# Patient Record
Sex: Male | Born: 1940 | Race: White | Hispanic: No | Marital: Married | Smoking: Never smoker
Health system: Southern US, Community
[De-identification: ages and names within clinical notes are randomized; demographics above are authoritative.]

## PROBLEM LIST (undated history)

## (undated) DIAGNOSIS — R51 Headache: Secondary | ICD-10-CM

## (undated) DIAGNOSIS — Z8719 Personal history of other diseases of the digestive system: Secondary | ICD-10-CM

## (undated) DIAGNOSIS — I493 Ventricular premature depolarization: Secondary | ICD-10-CM

## (undated) DIAGNOSIS — M1990 Unspecified osteoarthritis, unspecified site: Secondary | ICD-10-CM

## (undated) DIAGNOSIS — I491 Atrial premature depolarization: Secondary | ICD-10-CM

## (undated) DIAGNOSIS — I10 Essential (primary) hypertension: Secondary | ICD-10-CM

## (undated) DIAGNOSIS — I4729 Other ventricular tachycardia: Secondary | ICD-10-CM

## (undated) DIAGNOSIS — M797 Fibromyalgia: Secondary | ICD-10-CM

## (undated) DIAGNOSIS — C801 Malignant (primary) neoplasm, unspecified: Secondary | ICD-10-CM

## (undated) DIAGNOSIS — I472 Ventricular tachycardia: Secondary | ICD-10-CM

## (undated) DIAGNOSIS — R9431 Abnormal electrocardiogram [ECG] [EKG]: Secondary | ICD-10-CM

## (undated) DIAGNOSIS — J189 Pneumonia, unspecified organism: Secondary | ICD-10-CM

## (undated) DIAGNOSIS — D496 Neoplasm of unspecified behavior of brain: Secondary | ICD-10-CM

## (undated) DIAGNOSIS — Z972 Presence of dental prosthetic device (complete) (partial): Secondary | ICD-10-CM

## (undated) DIAGNOSIS — I428 Other cardiomyopathies: Secondary | ICD-10-CM

## (undated) DIAGNOSIS — K6389 Other specified diseases of intestine: Secondary | ICD-10-CM

## (undated) DIAGNOSIS — R52 Pain, unspecified: Secondary | ICD-10-CM

## (undated) DIAGNOSIS — K429 Umbilical hernia without obstruction or gangrene: Secondary | ICD-10-CM

## (undated) DIAGNOSIS — Z87442 Personal history of urinary calculi: Secondary | ICD-10-CM

## (undated) DIAGNOSIS — R5383 Other fatigue: Secondary | ICD-10-CM

## (undated) DIAGNOSIS — F419 Anxiety disorder, unspecified: Secondary | ICD-10-CM

## (undated) HISTORY — DX: Other specified diseases of intestine: K63.89

## (undated) HISTORY — DX: Other ventricular tachycardia: I47.29

## (undated) HISTORY — DX: Umbilical hernia without obstruction or gangrene: K42.9

## (undated) HISTORY — DX: Essential (primary) hypertension: I10

## (undated) HISTORY — DX: Abnormal electrocardiogram (ECG) (EKG): R94.31

## (undated) HISTORY — PX: TONSILLECTOMY: SUR1361

## (undated) HISTORY — PX: LITHOTRIPSY: SUR834

## (undated) HISTORY — DX: Atrial premature depolarization: I49.1

## (undated) HISTORY — PX: BRAIN TUMOR EXCISION: SHX577

## (undated) HISTORY — DX: Ventricular premature depolarization: I49.3

## (undated) HISTORY — DX: Ventricular tachycardia: I47.2

## (undated) HISTORY — DX: Other cardiomyopathies: I42.8

## (undated) HISTORY — DX: Personal history of other diseases of the digestive system: Z87.19

---

## 1988-10-08 DIAGNOSIS — M797 Fibromyalgia: Secondary | ICD-10-CM

## 1988-10-08 HISTORY — DX: Fibromyalgia: M79.7

## 1999-02-08 DIAGNOSIS — K6389 Other specified diseases of intestine: Secondary | ICD-10-CM

## 1999-02-08 HISTORY — PX: COLON SURGERY: SHX602

## 1999-02-08 HISTORY — DX: Other specified diseases of intestine: K63.89

## 1999-12-01 ENCOUNTER — Encounter (INDEPENDENT_AMBULATORY_CARE_PROVIDER_SITE_OTHER): Payer: Self-pay | Admitting: *Deleted

## 1999-12-01 ENCOUNTER — Encounter: Payer: Self-pay | Admitting: Hematology & Oncology

## 1999-12-01 ENCOUNTER — Inpatient Hospital Stay (HOSPITAL_COMMUNITY): Admission: RE | Admit: 1999-12-01 | Discharge: 1999-12-09 | Payer: Self-pay | Admitting: Gastroenterology

## 1999-12-06 ENCOUNTER — Encounter: Payer: Self-pay | Admitting: General Surgery

## 1999-12-28 ENCOUNTER — Ambulatory Visit (HOSPITAL_COMMUNITY): Admission: RE | Admit: 1999-12-28 | Discharge: 1999-12-28 | Payer: Self-pay | Admitting: General Surgery

## 1999-12-28 ENCOUNTER — Encounter: Payer: Self-pay | Admitting: General Surgery

## 2000-01-14 ENCOUNTER — Encounter (INDEPENDENT_AMBULATORY_CARE_PROVIDER_SITE_OTHER): Payer: Self-pay | Admitting: Specialist

## 2000-01-14 ENCOUNTER — Inpatient Hospital Stay (HOSPITAL_COMMUNITY): Admission: RE | Admit: 2000-01-14 | Discharge: 2000-01-19 | Payer: Self-pay | Admitting: General Surgery

## 2000-02-04 ENCOUNTER — Ambulatory Visit (HOSPITAL_COMMUNITY): Admission: RE | Admit: 2000-02-04 | Discharge: 2000-02-04 | Payer: Self-pay | Admitting: General Surgery

## 2000-02-04 ENCOUNTER — Encounter: Payer: Self-pay | Admitting: General Surgery

## 2007-05-28 ENCOUNTER — Ambulatory Visit (HOSPITAL_COMMUNITY): Admission: RE | Admit: 2007-05-28 | Discharge: 2007-05-28 | Payer: Self-pay | Admitting: Urology

## 2007-07-29 ENCOUNTER — Emergency Department (HOSPITAL_COMMUNITY): Admission: EM | Admit: 2007-07-29 | Discharge: 2007-07-29 | Payer: Self-pay | Admitting: Emergency Medicine

## 2007-08-16 ENCOUNTER — Emergency Department (HOSPITAL_COMMUNITY): Admission: EM | Admit: 2007-08-16 | Discharge: 2007-08-16 | Payer: Self-pay | Admitting: Internal Medicine

## 2007-08-17 ENCOUNTER — Ambulatory Visit: Payer: Self-pay | Admitting: Cardiovascular Disease

## 2008-01-15 ENCOUNTER — Ambulatory Visit: Payer: Self-pay | Admitting: Cardiovascular Disease

## 2010-06-22 NOTE — Assessment & Plan Note (Signed)
[facility]                            CARDIOLOGY OFFICE NOTE   NAME:[name], [name]                    MRN:          [id number]  DATE:01/15/2008                            DOB:          02-08-1940    [name] returns today for followup, but [name] has had borderline  hypertension.  [name] was on Micardis hydrochlorothiazide, but felt  terrible.  [name] was lethargic, lightheaded, weak, apparently dehydrated.   [name] stopped his medicine and feels better.  [name] has not been taking his  blood pressure at home.  [name] does not really seem to be interested in  taking blood pressure pills.  [name] had a ruptured biceps tendon on the  right side.  [name] followed up with the Orthopedic docs who did not feel it  needed to be reattached.  [name] is actually playing a little bit of tennis  now.  [name] is under a little bit of stress.  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  Other than this, [name] is not having any  recurrent chest pain.   REVIEW OF SYSTEMS:  Otherwise negative.   PHYSICAL EXAMINATION:  GENERAL:  Remarkable for middle-aged white male  in no distress.  VITAL SIGNS:  Blood pressure is 140/80, pulse is 70 and regular,  respiratory rate 14, afebrile.  HEENT:  Unremarkable.  NECK:  Supple.  No lymphadenopathy, thyromegaly, or JVP elevation.  LUNGS:  Clear with good diaphragmatic motion.  No wheezing.  CARDIAC:  S1 and S2 normal heart sounds.  PMI normal.  ABDOMEN:  Benign.  Bowel sounds positive.  No AAA, no tenderness, no  bruit, no hepatosplenomegaly, no hepatojugular reflux, or tenderness.  EXTREMITIES:  Distal pulses are intact.  No edema.  NEURO:  Nonfocal.  SKIN:  Warm and dry.  [name] has a ruptured right biceps tendon.   IMPRESSION:  1. Chest pain atypical.  No evidence of coronary artery disease.      Continue baby aspirin.  2. Borderline hypertension.  Continue to monitor  low-sodium diet.      Avoid stimulants.  Check blood pressure readings at home.  3. Ruptured biceps tendon.  Followup Orthopedics.  Continue p.r.n.      anti-inflammatories.  I will see him back in 6 months.     Noralyn Pick. Eden Emms, MD, [facility]  Electronically Signed    PCN/MedQ  DD: 01/15/2008  DT: 01/16/2008  Job #: [id number]

## 2010-06-22 NOTE — Assessment & Plan Note (Signed)
[facility]                            CARDIOLOGY OFFICE NOTE   NAME:[name], [name]                    MRN:          [id number]  DATE:08/17/2007                            DOB:          [birth year]    HISTORY OF PRESENT ILLNESS:  Ms. Benninger is seen today as a new patient  at the request of Dr. Carmon Ginsberg in the [facility] ER.  He has been in the  ER twice in the last month.  He has had atypical chest pain.  The pain  is in the center of his chest.  It is sharp.  It is nonpleuritic.  It  has been intermittent.  It is in the setting of significant fatigue.   The patient is concerned about his blood pressure medicine.  He  apparently has been on it for 2-3 years.  However, he never feels well  on it.  He says he takes his blood pressure at home and it tends to be  low.  He was seen in the ER recently and was told he was dehydrated.  He  despite this did not stop his Micardis/HCTZ.   Along with this atypical pain, he gets occasional headaches.  He feels  faint and dizzy.  I think he is clearly being over medicated and should  not be on a diuretic.   In fact, at times, he has stopped his Micardis/HCTZ and says he feels  better.  The patient plays a lot a [employer] tennis at fairly high level 4.0.  Particularly with his activity level, I think this is exacerbating his  dehydration and postural-like symptoms.   He has never had any significant chest pain prior to these episodes of  dehydration and probable postural symptoms.  He does not smoke.  He is a  nondiabetic.  He has had borderline hypertension in the past.   FAMILY HISTORY:  Negative for premature coronary disease.   PAST MEDICAL HISTORY:  His past medical histsory is otherwise remarkable  for a cranial canal nonmalignant tumor in 1968, history of colon mass in  2000, and history of multiple kidney stones and ureteral problems.   ALLERGIES:  The patient is allergic to [facility] and SULFAS.   MEDICATIONS:  He is only on Micardis/HCTZ 40/12.5 which will be stopped.  He takes p.r.n. Tylenol and Advil for aches and pains.  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  He does not smoke or drink.   SOCIAL HISTORY:  He is semi-retired.   FAMILY HISTORY:  Remarkable for mother still being alive.  Father died  at age 69 of unknown causes.   PHYSICAL EXAMINATION:  GENERAL:  His exam is remarkable for a thin  elderly white male in no distress.  VITAL SIGNS:  Blood pressure is 125/70, pulse is 62 and regular.  He  does have mild postural symptoms with his blood pressure going to 110  systolic on standing, afebrile, respiratory rate 14.  HEENT:  Unremarkable.  Carotids normal without bruit. No  lymphadenopathy, thyromegaly, or JVP elevation.  LUNGS:  Clear.  Good diaphragmatic motion. No wheezing.  HEART:  S1 with split second heart sound.  PMI normal.  ABDOMEN:  Benign.  He has an umbilical hernia.  No AAA, no tenderness,  no bruit, no hepatosplenomegaly, no hepatojugular reflexes.  Pulses are  intact, no edema.  NEUROLOGICAL:  Nonfocal.  SKIN:  Warm and dry.  No muscular weakness.   I reviewed his ER notes, reviewed his lab work as well as his EKG.  His  EKG shows sinus rhythm is entirely normal.   LABORATORY DATA:  The patient's hematocrit was 43.2.  His potassium was  3.6, BUN was 19, creatinine was 1.26.   LFTs were normal.   IMPRESSION:  1. Chest pain, atypical.  Follow up stress echo.  2. Hypertension, borderline; clearly feels worse on medication.  Stop      Micardis/HCTZ.  Follow blood pressure readings at home.  Follow up      in 6-8 weeks.  3. Relative dizziness, postural symptoms, dehydration.  Stop diuretic      medicine, particularly HCTZ.  If his blood pressure arises, he will      need to be on a nondiuretic fill.   I will see him back in 6-8 weeks to assess his blood pressure off   medicine and hopefully a stress echo will be normal.     Theron Arista C. Eden Emms, MD, [facility]  Electronically Signed    PCN/MedQ  DD: 08/17/2007  DT: 08/18/2007  Job #: [id number]

## 2010-06-25 NOTE — Discharge Summary (Signed)
[place]. [facility]  Patient:    [name], [name]                      MRN: [id number] Adm. Date:  [id number] Disc. Date: [id number] Attending:  Caleen Essex                           Discharge Summary  HISTORY OF PRESENT ILLNESS:  The patient is a 70 year old gentleman who presented with rectal bleeding.  He underwent colonoscopy at which time a narrowed area of the sigmoid colon was found.  Biopsies were taken, and surgery consult was called.  I saw the patient and admitted him to the hospital for a workup.  His CT scan was much more consistent with a diverticulitis than with a colon tumor.  He was started on broad-spectrum antibiotics intravenously and over the next several days improved.  By November 1, he was tolerating a low-residue diet, and his pain was continuing to improve on oral antibiotics, and he was ready for discharge to home.  DISCHARGE MEDICATIONS:  Continue his home medications as well as to take Tequin and Flagyl.  DIET:  Low residue.  ACTIVITY:  As tolerated  CONDITION UPON DISCHARGE:  Stable.  DISPOSITION:  The patient was discharged home. DD:  12/24/99 TD:  12/25/99 Job: [id number]

## 2010-06-25 NOTE — H&P (Signed)
[place]. [facility]  Patient:    [name], [name]                      MRN: [id number] Adm. Date:  [id number] Attending:  Charna Elizabeth                         History and Physical  HISTORY OF PRESENT ILLNESS:  [name] is a 69 year old white male who has been having left lower quadrant abdominal pain since July.  For the last week he has also been noticing some blood with his stools.  He denies any nausea, vomiting, fevers, chills, chest pain, shortness of breath, diarrhea, or dysuria.  His bowels have actually been moving about twice a day regularly, and have been formed.  He did lose about 10-15 pounds a couple of months ago, but gained that weight back fairly easily.  Otherwise his appetite has been good.  REVIEW OF SYSTEMS:  Is otherwise pretty unremarkable.  He was brought to the hospital today for a colonoscopy and upper endoscopy. 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  The colonoscopy was significant for a near obstructing mass lesion at about 20.0 cm in his sigmoid colon.  This area was biopsied but an endoscope was unable to be passed proximal to this area.  He did perform a bowel prep at home prior to his colonoscopy.  PAST MEDICAL HISTORY:  Significant for gastroesophageal reflux.  PAST SURGICAL HISTORY:  Significant for a tonsillectomy.  CURRENT MEDICATIONS:  None.  ALLERGIES:  SULFA DRUGS.  FAMILY HISTORY:  Significant for colon cancer in his father.  SOCIAL HISTORY:  Significant for weekend alcohol use and the patient denies any use of tobacco products.  PHYSICAL EXAMINATION:  GENERAL:  He is a well-developed, well-nourished white male, in no acute distress.  SKIN:  Warm and dry.  HEENT:  Extraocular muscles intact.  Pupils equal, round, reactive to light.  NECK:  No bruits.  LUNGS:  Clear to auscultation bilaterally.  HEART:  A regular rate and  rhythm, without murmur.  ABDOMEN:  Soft, nontender, except for some tenderness to palpation of his left lower quadrant.  He has no surgical scars on his abdomen.  No inguinal lymphadenopathy.  EXTREMITIES:  No cyanosis, clubbing, or edema with good peripheral pulses.  NEUROLOGIC:  He is alert and oriented x 4.  NODES:  I can palpate no lymphadenopathy.  LABORATORY DATA:  White count 12,900, hemoglobin 14, hematocrit 40.3, platelet count 469,000.  PT 12.7, PTT 29, INR 1.0.  Sodium 140, potassium 4.1, chloride 100, CO2 of 30, BUN 14, creatinine 0.9, glucose 93.  Alkaline phosphatase 133, SGOT 28, SGPT 25, albumin 3.7, calcium 9.0.  ASSESSMENT/PLAN:  This is a 70 year old white male with near obstructing distal colon mass lesion, worrisome for malignancy.  PLAN:  We will admit him to the hospital today and finish his workup for colon cancer, as well as complete his bowel prep, since he is not completely obstructed yet.  We will then talk to the patient and to his family about our findings, and our medical and surgical options. DD:  12/01/99 TD:  12/01/99 Job: [id number]

## 2010-06-25 NOTE — Op Note (Signed)
[place]. [facility]  Patient:    [name], [name]                      MRN: [id number] Proc. Date: 01/14/00 Adm. Date:  [id number] Attending:  Caleen Essex                           Operative Report  HISTORY OF PRESENT ILLNESS:  The patient is a 70 year old gentleman with diverticulitis.  HOSPITAL COURSE:  He was brought to the operating room on January 14, 2000 for a sigmoid colectomy.  He tolerated the procedure well.  He has had a fairly uneventful postoperative course and as of today, he is tolerating a regular diet.  His pain is well controlled on p.o. pain meds.  He was able to urinate without difficulty and he is ambulating without difficulty.  His medications at time of discharge are his regular home medicines and Vicodin.  His activity level, he is to do no heavy lifting.  His condition is stable.  FOLLOW-UP:  He should schedule an appointment with me in approximately two weeks.  FINAL DIAGNOSES:  Sigmoid diverticulitis.  He is discharged home. DD:  01/19/00 TD:  01/19/00 Job: [id number]

## 2010-06-25 NOTE — Procedures (Signed)
[place]. [facility]  Patient:    [name], [name]                      MRN: [id number] Proc. Date: 12/01/99 Adm. Date:  [id number] Attending:  Caleen Essex CC:         Paulino Rily, M.D.  Chevis Pretty, M.D.   Procedure Report  DATE OF BIRTH:  [birth year].  PROCEDURE:  Esophagogastroduodenoscopy with biopsies.  ENDOSCOPIST:  Anselmo Rod, M.D.  INSTRUMENT USED:  Olympus video panendoscope.  INDICATIONS FOR PROCEDURE:  A 70 year old white male with a history of epigastric, left upper quadrant, and left lower quadrant pain.  Rule out peptic ulcer disease, esophagitis, gastritis, etc.  PREPROCEDURE PREPARATION:  Informed consent was procured from the patient. The patient was fasted for eight hours prior to the procedure.  PREPROCEDURE PHYSICAL:  VITAL SIGNS:  Patient with stable vital signs.  NECK:  Supple.  CHEST:  Clear to auscultation.  S1, S2 regular.  ABDOMEN:  Soft with normal abdominal bowel sounds.  DESCRIPTION OF PROCEDURE:  The patient was placed in the left lateral decubitus position and sedated with 50 mg of Demerol and 5 mg of Versed intravenously.  Once the patient was adequately sedate and maintained on low-flow oxygen and continuous cardiac monitoring, the Olympus video panendoscope was advanced through the mouthpiece over the tongue to the esophagus under direct vision.  The proximal esophagus appeared normal.  There was a patchy area of salmon-pink mucosa that was biopsied to rule out Barretts.  There was an edematous, _____, superficial ulceration in the proximal portion of the stomach.  This was biopsied for pathology.  These were split, two biopsies were done, a no more biopsies were taken.  The patient has a small hiatal hernia.  The proximal small bowel appeared normal, and so did the rest of the antral mucosa.  IMPRESSION: 1. Patchy area of salmon-pink mucosa around the Z-line, biopsied for    Barretts, results  pending. 2. Edematous gastric fold with easy bleeding in proximal half of stomach. 3. A small hiatal hernia. 4. Normal proximal small bowel.  RECOMMENDATIONS: 1. Proceed with colonoscopy at time. 2. N.P.O. status. 3. Await pathology results. DD:  12/01/99 TD:  12/02/99 Job: [id number]

## 2010-06-25 NOTE — Procedures (Signed)
[place]. [facility]  Patient:    [name], [name]                      MRN: [id number] Proc. Date: 12/01/99 Adm. Date:  [id number] Attending:  Caleen Essex CC:         Paulino Rily, M.D.  Chevis Pretty, M.D.   Procedure Report  DATE OF BIRTH:  [birth year]  REFERRING PHYSICIAN:  PROCEDURE PERFORMED:  Colonoscopy changed to flexible sigmoidoscopy with biopsies.  ENDOSCOPIST:  Anselmo Rod, M.D.  INSTRUMENT USED:  Olympus video colonoscope.  INDICATIONS FOR PROCEDURE:  The patient is a  70 year old white male with history of rectal bleeding and history of colon cancer in his father rule out colonic masses, polyps, erosions, ulcerations, etc.  PREPROCEDURE PREPARATION:  Informed consent was procured from the patient. The patient was fasted for eight hours prior to the procedure and prepped with a bottle of magnesium citrate and a gallon of NuLytely the night prior to the procedure.  PREPROCEDURE PHYSICAL:  The patient had stable vital signs.  Neck supple. Chest clear to auscultation.  S1, S2 regular.  Abdomen soft with normal abdominal bowel sounds.  Epigastric, left upper quadrant and left lower quadrant tenderness on palpation and guarding, no rebound, no rigidity, no hepatosplenomegaly.  DESCRIPTION OF PROCEDURE:  The patient was placed in the left lateral decubitus position and sedated with an additional 25 mg of Demerol and 2 mg of Versed intravenously.  Once the patient was adequately sedated and maintained on low-flow oxygen and continuous cardiac monitoring, the Olympus video colonoscope was advanced from the rectum to about 20 cm with difficulty. There seemed to be a constricting lesion at this point and the scope could not be advanced beyond this point.  The scope was changed from an adult to a pediatric colonoscope and even then this area could not be bypassed.  Biopsies were done from this area to rule out adenocarcinoma.  The  patient tolerated the procedure well without complications.  IMPRESSION: 1. Near obstructing lesion at 20 cm.  Biopsies done, results pending. 2. Procedure aborted at 20 cm because the scope could not be advanced beyond    this point.  RECOMMENDATIONS: 1. CT scan of the abdomen to be done tonight. 2. Surgical evaluation being procured by Dr. Ailene Ards. Carolynne Edouard. 3. Further recommendations to be made thereafter. DD:  12/01/99 TD:  12/02/99 Job: [id number]

## 2010-06-25 NOTE — Op Note (Signed)
[place]. [facility]  Patient:    [name], [name]                      MRN: [id number] Proc. Date: 01/14/00 Adm. Date:  [id number] Attending:  Chevis Pretty S                           Operative Report  PREOPERATIVE DIAGNOSIS:  Sigmoid diverticulitis.  POSTOPERATIVE DIAGNOSIS:  Sigmoid diverticulitis.  PROCEDURE:  Sigmoid colectomy.  SURGEON:  Chevis Pretty, M.D.  ASSISTANT:  Currie Paris, M.D.  ANESTHESIA:  General endotracheal.  DESCRIPTION OF PROCEDURE:  After informed consent was obtained, the patient was brought to the operating room and placed in the supine position on the operating room table.  After adequate induction of general anesthesia, the patients abdomen was prepped with Betadine and draped in the usual sterile manner.  A vertical-oriented lower midline incision was made from just above the umbilicus to the pubic symphysis.  This incision was carried in through the skin and subcutaneous tissues with the Bovie electrocautery until the midline fascia of the anterior abdominal wall was encountered.  This fascial layer was incised with the Bovie electrocautery, and the preperitoneal space was probed with a hemostat until the peritoneum was opened bluntly.  At this point, a finger was inserted through the peritoneal opening and the anterior abdominal wall was palpated.  There were no adhesions noted.  The rest of the incision was then opened under direct vision using the Bovie electrocautery. The abdomen was then examined, and a short segment of sigmoid colon was identified and found to be very inflamed.  This portion of the colon was densely adherent to the bladder and pelvic sidewalls.  There were segments of descending and sigmoid colon above the inflamed area that appeared normal and by palpation appeared to be upper portions of the upper rectum that also felt normal.  The dissection was started by dividing the retroperitoneal  attachment of the sigmoid colon along the white line of Toldt.  This was carried down to the inflamed area of sigmoid colon.  Using a combination of sharp dissection with the Bovie electrocautery between the arms of a tonsil clamp as well as blunt dissection with the tonsil clamp as well as with a blunt probing with the fingers and finger fracture of some of the inflammatory adhesions, the sigmoid colon was able to be freed from its inflammatory attachments. Anteriorly the sigmoid colon was densely adherent to the bladder, and a small section of bladder muscle had to be taken with the sigmoid colon.  This was a very superficial layer of muscle, and the bladder appeared to be intact when this division was complete.  Next, the attention was directed to the retroperitoneum, and using blunt dissection, the ureter was identified and the ureter was deep and lateral to all of the dissection that had been done to this point, and care was taken to keep the ureter out of the way.  Next, a point of normal sigmoid colon superiorly and a portion of normal rectum inferiorly was chosen as the division points.  Superiorly and on the sigmoid colon, the mesenteric attachment to the wall of the sigmoid colon was identified, and a free portion of this was able to be palpated and opened with the Bovie electrocautery, allowing circumferential clearing of the bowel wall. Next, an Allen clamp was placed through this hole, and the wall of  the sigmoid colon was clamped.  A Kelly clamp was placed inferiorly through this same hole, and the sigmoid colon was divided between the two with a 10 blade knife. The mesentery of this portion of diseased colon was then serially clamped between Kelly clamps, divided with Metzenbaum scissors, and ligated with 2-0 silk ties until dissection had been carried inferiorly to the portion previously chosen of normal rectum to be divided.  One bleeding vein in the mesentery did require a  suture ligature with a 2-0 silk suture.  Once the inferior portion chosen for division had been reached, again the bowel was clamped inferior to the portion chosen for division with a Satinsky clamp.  An Allen clamp was then placed inferiorly along the area to be divided and a Kelly clamp placed along the superior portion of the colon to be divided.  The rectum was then divided between the two with a 10 blade knife, and the specimen was removed from the patient and sent fresh for pathologic evaluation.  A soft non-crushing bowel clamp was placed on the descending colon to keep any colonic contents from entering the field.  The Satinsky clamp was left on the rectum inferiorly to keep any colon contents from entering the field.  The descending colon was then mobilized by dividing its retroperitoneal attachment along the white line of Toldt until it had been mobilized enough that the inferior portion of the colon could easily reach the superior portion of the rectum.  Once this was complete, the Allen clamps were removed from the bowel, and 2-0 silk seromuscular bites were placed at each lateral corner.  These were tied down and hemostatted to anchor the bowel in place so it could be worked on.  Next, several 2-0 silk sutures were placed using seromuscular bites along the posterior wall of the colon as a reinforcing layer.  Next, two 3-0 PDS sutures were placed with full-thickness bites through the middle of the posterior wall, and each was tied down.  Each of these 3-0 PDS sutures was then run laterally using a simple running stitch, using full-thickness bites of the bowel wall.  This was done circumferentially until the two sutures met along the anterior wall.  At this point, these sutures were tied down to each other, taking care not to pursestring the bowel lumen.  A second layer of 2-0 silk Lembert sutures was placed anteriorly to reinforce the anterior anastomosis.  The lumen of the bowel  was palpated and appeared to be widely patent.  The abdomen was then irrigated with copious amounts of warm saline.  The abdomen was then examined again and was found to be hemostatic.  The fascia of the anterior abdominal wall was then closed with  two running #1 PDS sutures.  The subcutaneous tissue was then irrigated again with copious amounts of saline, and the skin was closed with staples.  The patient tolerated the procedure well.  At the end of the case, all needle, sponge, and instrument counts were correct.  The patient was awakened and taken to the recovery room in stable condition. DD:  01/14/00 TD:  01/14/00 Job: [id number]

## 2010-11-04 LAB — COMPREHENSIVE METABOLIC PANEL
ALT: 16
AST: 31
Alkaline Phosphatase: 70
Alkaline Phosphatase: 73
BUN: 22
CO2: 26
CO2: 29
Calcium: 10.1
Chloride: 103
Creatinine, Ser: 1.26
GFR calc Af Amer: >60
GFR calc non Af Amer: 57 — ABNORMAL LOW
GFR calc non Af Amer: >60
Glucose, Bld: 103 — ABNORMAL HIGH
Potassium: 3.6
Sodium: 139
Total Bilirubin: 1.1
Total Protein: 6.7

## 2010-11-04 LAB — DIFFERENTIAL
Basophils Absolute: 0
Basophils Absolute: 0.1
Basophils Relative: 0
Basophils Relative: 1
Eosinophils Absolute: 0
Eosinophils Absolute: 0
Eosinophils Relative: 1
Eosinophils Relative: 0
Monocytes Absolute: 0.5
Monocytes Relative: 8
Neutro Abs: 3.9

## 2010-11-04 LAB — POCT CARDIAC MARKERS
CKMB, poc: 1.4
Myoglobin, poc: 65.6
Myoglobin, poc: 69.1

## 2010-11-04 LAB — URINALYSIS, ROUTINE W REFLEX MICROSCOPIC
Bilirubin Urine: NEGATIVE
Bilirubin Urine: NEGATIVE
Hgb urine dipstick: NEGATIVE
Hgb urine dipstick: NEGATIVE
Ketones, ur: NEGATIVE
Ketones, ur: NEGATIVE
Nitrite: NEGATIVE
Nitrite: NEGATIVE
Protein, ur: NEGATIVE
Specific Gravity, Urine: 1.012
Urobilinogen, UA: 1
Urobilinogen, UA: 0.2

## 2010-11-04 LAB — CBC
HCT: 43.2
Hemoglobin: 15.7
MCHC: 34.1
MCV: 99
MCV: 98.9
Platelets: 277
RBC: 4.66
RDW: 12.4
WBC: 5.4

## 2010-11-04 LAB — TROPONIN I: Troponin I: 0.01

## 2010-11-04 LAB — LIPASE, BLOOD: Lipase: 27

## 2010-11-04 LAB — MAGNESIUM: Magnesium: 2.4

## 2011-12-20 ENCOUNTER — Encounter (HOSPITAL_COMMUNITY): Payer: Self-pay | Admitting: *Deleted

## 2011-12-20 ENCOUNTER — Emergency Department (HOSPITAL_COMMUNITY): Payer: Medicare Other

## 2011-12-20 ENCOUNTER — Emergency Department (HOSPITAL_COMMUNITY)
Admission: EM | Admit: 2011-12-20 | Discharge: 2011-12-20 | Disposition: A | Discharge status: Home / Self Care | Payer: Medicare Other | Attending: Emergency Medicine | Admitting: Emergency Medicine

## 2011-12-20 DIAGNOSIS — N200 Calculus of kidney: Secondary | ICD-10-CM | POA: Diagnosis not present

## 2011-12-20 DIAGNOSIS — R6883 Chills (without fever): Secondary | ICD-10-CM | POA: Insufficient documentation

## 2011-12-20 DIAGNOSIS — R112 Nausea with vomiting, unspecified: Secondary | ICD-10-CM | POA: Diagnosis not present

## 2011-12-20 DIAGNOSIS — N201 Calculus of ureter: Secondary | ICD-10-CM | POA: Diagnosis not present

## 2011-12-20 DIAGNOSIS — Z86011 Personal history of benign neoplasm of the brain: Secondary | ICD-10-CM | POA: Insufficient documentation

## 2011-12-20 HISTORY — DX: Neoplasm of unspecified behavior of brain: D49.6

## 2011-12-20 LAB — CBC WITH DIFFERENTIAL/PLATELET
Basophils Absolute: 0 10*3/uL (ref 0.0–0.1)
HCT: 40.4 % (ref 39.0–52.0)
Lymphocytes Relative: 15 % (ref 12–46)
Lymphs Abs: 1.2 10*3/uL (ref 0.7–4.0)
MCV: 94.6 fL (ref 78.0–100.0)
Monocytes Absolute: 0.7 10*3/uL (ref 0.1–1.0)
Neutro Abs: 6.1 10*3/uL (ref 1.7–7.7)
RBC: 4.27 MIL/uL (ref 4.22–5.81)
RDW: 12.6 % (ref 11.5–15.5)
WBC: 8 10*3/uL (ref 4.0–10.5)

## 2011-12-20 LAB — URINALYSIS, ROUTINE W REFLEX MICROSCOPIC
Glucose, UA: NEGATIVE mg/dL
Hgb urine dipstick: NEGATIVE
Leukocytes, UA: NEGATIVE
Protein, ur: NEGATIVE mg/dL
Specific Gravity, Urine: 1.014 (ref 1.005–1.030)
pH: 7.5 (ref 5.0–8.0)

## 2011-12-20 LAB — BASIC METABOLIC PANEL
CO2: 25 mEq/L (ref 19–32)
Chloride: 99 mEq/L (ref 96–112)
Creatinine, Ser: 1.08 mg/dL (ref 0.50–1.35)
Glucose, Bld: 106 mg/dL — ABNORMAL HIGH (ref 70–99)
Sodium: 132 mEq/L — ABNORMAL LOW (ref 135–145)

## 2011-12-20 MED ORDER — TAMSULOSIN HCL 0.4 MG PO CAPS: 0.4000 mg | ORAL_CAPSULE | Freq: Every day | ORAL | Status: DC

## 2011-12-20 MED ORDER — MORPHINE SULFATE 4 MG/ML IJ SOLN
4.0000 mg | Freq: Once | INTRAMUSCULAR | Status: AC
  Administered 2011-12-20: 4 mg via INTRAVENOUS
  Filled 2011-12-20: qty 1

## 2011-12-20 MED ORDER — ONDANSETRON HCL 4 MG/2ML IJ SOLN
4.0000 mg | Freq: Once | INTRAMUSCULAR | Status: AC
  Administered 2011-12-20: 4 mg via INTRAVENOUS
  Filled 2011-12-20: qty 2

## 2011-12-20 MED ORDER — SODIUM CHLORIDE 0.9 % IV BOLUS (SEPSIS)
500.0000 mL | Freq: Once | INTRAVENOUS | Status: AC
  Administered 2011-12-20: 500 mL via INTRAVENOUS

## 2011-12-20 MED ORDER — OXYCODONE-ACETAMINOPHEN 5-325 MG PO TABS: 1.0000 | ORAL_TABLET | ORAL | Status: DC | PRN

## 2011-12-20 MED ORDER — KETOROLAC TROMETHAMINE 30 MG/ML IJ SOLN
30.0000 mg | Freq: Once | INTRAMUSCULAR | Status: AC
  Administered 2011-12-20: 30 mg via INTRAVENOUS
  Filled 2011-12-20: qty 1

## 2011-12-20 NOTE — ED Notes (Signed)
Pt c/o right flank pain since 6pm; vomiting; history of kidney stone--feels the same

## 2011-12-20 NOTE — ED Provider Notes (Signed)
Medical screening examination/treatment/procedure(s) were conducted as a shared visit with non-physician practitioner(s) and myself.  I personally evaluated the patient during the encounter  6:11 AM Patient states pain is significantly improved after IV medications. The right flank tenderness at this time.  Hanley Seamen, MD 12/20/11 [phone]

## 2011-12-20 NOTE — ED Provider Notes (Signed)
History     CSN: [id number]  Arrival date & time 12/20/11  0346   First MD Initiated Contact with Patient 12/20/11 0353      Chief Complaint  Patient presents with  . Flank Pain   HPI  History provided by the patient and spouse. Patient is a 71 year old male with history of previous kidney stones who presents with complaints of right flank pain similar to previous kidney stones who began around 5:30 or 6 PM yesterday evening. Pain has been persistent and worsening throughout the night. Patient has tried using ibuprofen with slight improvement but states pain has only worsened. Symptoms have been associated with some episodes of vomiting and nausea. Patient has also had some chills. He denies having any fever. Denies having any dysuria, hematuria or urinary frequency.    Past Medical History  Diagnosis Date  . Kidney stone   . Brain tumor     Past Surgical History  Procedure Date  . Colon surgery   . Brain tumor excision   . Lithotripsy     No family history on file.  History  Substance Use Topics  . Smoking status: Never Smoker   . Smokeless tobacco: Not on file  . Alcohol Use: No      Review of Systems  Constitutional: Positive for chills. Negative for fever and diaphoresis.  Respiratory: Negative for shortness of breath.   Gastrointestinal: Positive for nausea, vomiting and abdominal pain. Negative for diarrhea and constipation.  Genitourinary: Positive for flank pain. Negative for dysuria, frequency and hematuria.  All other systems reviewed and are negative.    Allergies  Sulfa antibiotics  Home Medications  No current outpatient prescriptions on file.  BP 187/113  Pulse 56  Temp 97.7 F (36.5 C) (Oral)  Resp 24  Ht 6\' 1"  (1.854 m)  Wt 165 lb (74.844 kg)  BMI 21.77 kg/m2  SpO2 100%  Physical Exam  Nursing note and vitals reviewed. Constitutional: He is oriented to person, place, and time. He appears well-developed and well-nourished. He  appears distressed.  HENT:  Head: Normocephalic.  Cardiovascular: Normal rate and regular rhythm.   Pulmonary/Chest: Effort normal and breath sounds normal. No respiratory distress. He has no wheezes. He has no rales.  Abdominal: Soft. He exhibits no mass. There is no tenderness. There is no rebound and no guarding.       Right CVA tenderness  Neurological: He is alert and oriented to person, place, and time.  Skin: Skin is warm. He is not diaphoretic.  Psychiatric: He has a normal mood and affect. His behavior is normal.    ED Course  Procedures  Results for orders placed during the hospital encounter of 12/20/11  URINALYSIS, ROUTINE W REFLEX MICROSCOPIC      Component Value Range   Color, Urine YELLOW  YELLOW   APPearance CLOUDY (*) CLEAR   Specific Gravity, Urine 1.014  1.005 - 1.030   pH 7.5  5.0 - 8.0   Glucose, UA NEGATIVE  NEGATIVE mg/dL   Hgb urine dipstick NEGATIVE  NEGATIVE   Bilirubin Urine NEGATIVE  NEGATIVE   Ketones, ur TRACE (*) NEGATIVE mg/dL   Protein, ur NEGATIVE  NEGATIVE mg/dL   Urobilinogen, UA 0.2  0.0 - 1.0 mg/dL   Nitrite NEGATIVE  NEGATIVE   Leukocytes, UA NEGATIVE  NEGATIVE  CBC WITH DIFFERENTIAL      Component Value Range   WBC 8.0  4.0 - 10.5 K/uL   RBC 4.27  4.22 - 5.81 MIL/uL  Hemoglobin 13.8  13.0 - 17.0 g/dL   HCT 16.1  09.6 - 04.5 %   MCV 94.6  78.0 - 100.0 fL   MCH 32.3  26.0 - 34.0 pg   MCHC 34.2  30.0 - 36.0 g/dL   RDW 40.9  81.1 - 91.4 %   Platelets 301  150 - 400 K/uL   Neutrophils Relative 76  43 - 77 %   Neutro Abs 6.1  1.7 - 7.7 K/uL   Lymphocytes Relative 15  12 - 46 %   Lymphs Abs 1.2  0.7 - 4.0 K/uL   Monocytes Relative 8  3 - 12 %   Monocytes Absolute 0.7  0.1 - 1.0 K/uL   Eosinophils Relative 1  0 - 5 %   Eosinophils Absolute 0.1  0.0 - 0.7 K/uL   Basophils Relative 0  0 - 1 %   Basophils Absolute 0.0  0.0 - 0.1 K/uL  BASIC METABOLIC PANEL      Component Value Range   Sodium 132 (*) 135 - 145 mEq/L   Potassium 4.1   3.5 - 5.1 mEq/L   Chloride 99  96 - 112 mEq/L   CO2 25  19 - 32 mEq/L   Glucose, Bld 106 (*) 70 - 99 mg/dL   BUN 17  6 - 23 mg/dL   Creatinine, Ser 7.82  0.50 - 1.35 mg/dL   Calcium 9.2  8.4 - 95.6 mg/dL   GFR calc non Af Amer 68 (*) >90 mL/min   GFR calc Af Amer 78 (*) >90 mL/min       Ct Abdomen Pelvis Wo Contrast  12/20/2011  *RADIOLOGY REPORT*  Clinical Data: Right flank pain.  CT ABDOMEN AND PELVIS WITHOUT CONTRAST  Technique:  Multidetector CT imaging of the abdomen and pelvis was performed following the standard protocol without intravenous contrast.  Comparison: 04/20/2007  Findings: Mild cardiomegaly.  Lung bases are clear.  No effusions.  Bilateral nephrolithiasis.  There is a right hydronephrosis and perinephric stranding.  9 mm stone in the proximal right ureter. Two additional adjacent stones in the mid right ureter.  The largest measures up to 8 mm in long axis diameter.  No hydronephrosis on the left.  Urinary bladder is unremarkable.  Mild enlargement prostate.  There is an umbilical hernia and paraumbilical hernia, both containing fat.  Supraumbilical hernia also contains fat. These are unchanged since prior study.  Liver, spleen, pancreas, gallbladder, adrenals are unremarkable.  Appendix is visualized and is normal.  Small bowel is decompressed. Stomach and large bowel grossly unremarkable.  No acute bony abnormality.  IMPRESSION: Three right ureteral stones.  Moderate right hydronephrosis and perinephric stranding.  Bilateral nephrolithiasis.  At least three ventral hernias including an umbilical hernia. These contain fat.   Original Report Authenticated By: Charlett Nose, M.D.      1. Kidney stone       MDM  3:40 AM patient seen and evaluated. Patient appears uncomfortable but in no acute distress. Symptoms are similar to previous kidney stones. A kidney stone was 2-3 years ago. Patient has seen Dr. Brunilda Payor in the past   Patient feeling much better after medications. CT  scan shows right kidney stones. Patient has 3 within the ureter largest is 9 mm. He is comfortable at this time and we'll plan for discharge with close urology followup. He will call Dr. Brunilda Payor today.        Angus Seller, [place] 12/20/11 [phone]

## 2012-08-23 ENCOUNTER — Emergency Department (HOSPITAL_COMMUNITY): Payer: Medicare Other

## 2012-08-23 ENCOUNTER — Emergency Department (HOSPITAL_COMMUNITY)
Admission: EM | Admit: 2012-08-23 | Discharge: 2012-08-24 | Disposition: A | Discharge status: Home / Self Care | Payer: Medicare Other | Attending: Emergency Medicine | Admitting: Emergency Medicine

## 2012-08-23 ENCOUNTER — Encounter (HOSPITAL_COMMUNITY): Payer: Self-pay | Admitting: Emergency Medicine

## 2012-08-23 DIAGNOSIS — R109 Unspecified abdominal pain: Secondary | ICD-10-CM | POA: Diagnosis not present

## 2012-08-23 DIAGNOSIS — Z79899 Other long term (current) drug therapy: Secondary | ICD-10-CM | POA: Insufficient documentation

## 2012-08-23 DIAGNOSIS — N200 Calculus of kidney: Secondary | ICD-10-CM | POA: Insufficient documentation

## 2012-08-23 DIAGNOSIS — N201 Calculus of ureter: Secondary | ICD-10-CM | POA: Diagnosis not present

## 2012-08-23 DIAGNOSIS — Z86011 Personal history of benign neoplasm of the brain: Secondary | ICD-10-CM | POA: Diagnosis not present

## 2012-08-23 LAB — CBC WITH DIFFERENTIAL/PLATELET
Basophils Absolute: 0 10*3/uL (ref 0.0–0.1)
Eosinophils Relative: 0 % (ref 0–5)
Lymphocytes Relative: 13 % (ref 12–46)
MCV: 95.1 fL (ref 78.0–100.0)
Neutro Abs: 8.2 10*3/uL — ABNORMAL HIGH (ref 1.7–7.7)
Platelets: 279 10*3/uL (ref 150–400)
RDW: 12.9 % (ref 11.5–15.5)
WBC: 10.4 10*3/uL (ref 4.0–10.5)

## 2012-08-23 LAB — COMPREHENSIVE METABOLIC PANEL
ALT: 20 U/L (ref 0–53)
AST: 35 U/L (ref 0–37)
CO2: 26 mEq/L (ref 19–32)
Calcium: 9.3 mg/dL (ref 8.4–10.5)
GFR calc non Af Amer: 69 mL/min — ABNORMAL LOW (ref >90)
Sodium: 136 mEq/L (ref 135–145)

## 2012-08-23 MED ORDER — MORPHINE SULFATE 4 MG/ML IJ SOLN
4.0000 mg | Freq: Once | INTRAMUSCULAR | Status: AC
  Administered 2012-08-23: 4 mg via INTRAVENOUS
  Filled 2012-08-23: qty 1

## 2012-08-23 MED ORDER — KETOROLAC TROMETHAMINE 30 MG/ML IJ SOLN
30.0000 mg | Freq: Once | INTRAMUSCULAR | Status: AC
  Administered 2012-08-23: 30 mg via INTRAVENOUS
  Filled 2012-08-23: qty 1

## 2012-08-23 MED ORDER — ONDANSETRON HCL 4 MG/2ML IJ SOLN
4.0000 mg | Freq: Once | INTRAMUSCULAR | Status: AC
  Administered 2012-08-23: 4 mg via INTRAVENOUS
  Filled 2012-08-23: qty 2

## 2012-08-23 NOTE — ED Provider Notes (Signed)
History    CSN: [id number] Arrival date & time 08/23/12  2235  First MD Initiated Contact with Patient 08/23/12 2315     Chief Complaint  Patient presents with  . Flank Pain   (Consider location/radiation/quality/duration/timing/severity/associated sxs/prior Treatment) Patient is a 72 y.o. male presenting with flank pain. The history is provided by the patient. No language interpreter was used.  Flank Pain This is a recurrent problem. The current episode started 6 to 12 hours ago. The problem occurs constantly. The problem has not changed since onset.Pertinent negatives include no chest pain, no abdominal pain, no headaches and no shortness of breath. Nothing aggravates the symptoms. Nothing relieves the symptoms. He has tried nothing for the symptoms. The treatment provided no relief.   Past Medical History  Diagnosis Date  . Kidney stone   . Brain tumor   . Kidney stones    Past Surgical History  Procedure Laterality Date  . Colon surgery    . Brain tumor excision    . Lithotripsy     No family history on file. History  Substance Use Topics  . Smoking status: Never Smoker   . Smokeless tobacco: Not on file  . Alcohol Use: No    Review of Systems  Respiratory: Negative for shortness of breath.   Cardiovascular: Negative for chest pain.  Gastrointestinal: Negative for abdominal pain.  Genitourinary: Positive for flank pain.  Neurological: Negative for headaches.  All other systems reviewed and are negative.    Allergies  Other; Sulfa antibiotics; and Vicodin  Home Medications   Current Outpatient Rx  Name  Route  Sig  Dispense  Refill  . ibuprofen (ADVIL,MOTRIN) 200 MG tablet   Oral   Take 600 mg by mouth every 6 (six) hours as needed. For pain         . oxyCODONE-acetaminophen (PERCOCET) 5-325 MG per tablet   Oral   Take 1-2 tablets by mouth every 4 (four) hours as needed for pain.   20 tablet   0   . Tamsulosin HCl (FLOMAX) 0.4 MG CAPS   Oral  Take 1 capsule (0.4 mg total) by mouth daily.   30 capsule   0    BP 184/99  Pulse 55  Temp(Src) 97.6 F (36.4 C) (Oral)  Resp 16  SpO2 100% Physical Exam  Constitutional: He is oriented to person, place, and time. He appears well-developed and well-nourished. No distress.  HENT:  Head: Normocephalic and atraumatic.  Mouth/Throat: Oropharynx is clear and moist. No oropharyngeal exudate.  Eyes: Conjunctivae are normal. Pupils are equal, round, and reactive to light.  Neck: Normal range of motion. Neck supple.  Cardiovascular: Normal rate, regular rhythm and intact distal pulses.   Pulmonary/Chest: Effort normal and breath sounds normal. He has no wheezes. He has no rales.  Abdominal: Soft. Bowel sounds are normal. There is no tenderness. There is no rebound and no guarding.  Musculoskeletal: Normal range of motion.  Neurological: He is alert and oriented to person, place, and time.  Skin: Skin is warm and dry.  Psychiatric: He has a normal mood and affect.    ED Course  Procedures (including critical care time) Labs Reviewed  CBC WITH DIFFERENTIAL - Abnormal; Notable for the following:    Neutrophils Relative % 79 (*)    Neutro Abs 8.2 (*)    All other components within normal limits  COMPREHENSIVE METABOLIC PANEL - Abnormal; Notable for the following:    Glucose, Bld 106 (*)  GFR calc non Af Amer 69 (*)    GFR calc Af Amer 80 (*)    All other components within normal limits  URINALYSIS, ROUTINE W REFLEX MICROSCOPIC   No results found. No diagnosis found.  MDM  Pain resolved will need close follow up with urology.  Return for fevers intractable pain or vomiting or any concerns  Mahmood Boehringer K Tyann Niehaus-Rasch, MD 08/24/12 [phone]

## 2012-08-23 NOTE — ED Notes (Signed)
Patient given urinal to collect specimen in. Patient requested to call nurse when finished.

## 2012-08-23 NOTE — ED Notes (Signed)
Pt c/o r side flank pain x4p today.  Reports hx of kidneys stones.

## 2012-08-24 DIAGNOSIS — N201 Calculus of ureter: Secondary | ICD-10-CM | POA: Diagnosis not present

## 2012-08-24 DIAGNOSIS — N200 Calculus of kidney: Secondary | ICD-10-CM | POA: Diagnosis not present

## 2012-08-24 LAB — URINALYSIS, ROUTINE W REFLEX MICROSCOPIC
Leukocytes, UA: NEGATIVE
Nitrite: NEGATIVE
Protein, ur: NEGATIVE mg/dL
Urobilinogen, UA: 0.2 mg/dL (ref 0.0–1.0)

## 2012-08-24 LAB — URINE MICROSCOPIC-ADD ON

## 2012-08-24 MED ORDER — OXYCODONE-ACETAMINOPHEN 5-325 MG PO TABS: 1.0000 | ORAL_TABLET | Freq: Four times a day (QID) | ORAL | Status: DC | PRN

## 2012-08-24 MED ORDER — TAMSULOSIN HCL 0.4 MG PO CAPS: 0.4000 mg | ORAL_CAPSULE | Freq: Every day | ORAL | Status: DC

## 2012-08-24 MED ORDER — MORPHINE SULFATE 4 MG/ML IJ SOLN
4.0000 mg | Freq: Once | INTRAMUSCULAR | Status: AC
  Administered 2012-08-24: 4 mg via INTRAVENOUS
  Filled 2012-08-24: qty 1

## 2012-08-24 MED ORDER — DOXYCYCLINE HYCLATE 100 MG PO CAPS: 100.0000 mg | ORAL_CAPSULE | Freq: Two times a day (BID) | ORAL | Status: DC

## 2012-08-24 MED ORDER — ONDANSETRON 8 MG PO TBDP: ORAL_TABLET | ORAL | Status: DC

## 2012-08-31 DIAGNOSIS — N200 Calculus of kidney: Secondary | ICD-10-CM | POA: Diagnosis not present

## 2012-09-06 ENCOUNTER — Other Ambulatory Visit: Payer: Self-pay | Admitting: Urology

## 2012-09-25 ENCOUNTER — Encounter (HOSPITAL_COMMUNITY): Payer: Self-pay | Admitting: Pharmacy Technician

## 2012-10-03 ENCOUNTER — Encounter (HOSPITAL_COMMUNITY)
Admission: RE | Admit: 2012-10-03 | Discharge: 2012-10-03 | Disposition: A | Discharge status: Home / Self Care | Payer: Medicare Other | Source: Ambulatory Visit | Attending: Urology | Admitting: Urology

## 2012-10-03 ENCOUNTER — Encounter (HOSPITAL_COMMUNITY): Payer: Self-pay

## 2012-10-03 DIAGNOSIS — IMO0001 Reserved for inherently not codable concepts without codable children: Secondary | ICD-10-CM | POA: Diagnosis not present

## 2012-10-03 DIAGNOSIS — N201 Calculus of ureter: Secondary | ICD-10-CM | POA: Diagnosis not present

## 2012-10-03 DIAGNOSIS — M129 Arthropathy, unspecified: Secondary | ICD-10-CM | POA: Diagnosis not present

## 2012-10-03 DIAGNOSIS — N200 Calculus of kidney: Secondary | ICD-10-CM | POA: Diagnosis not present

## 2012-10-03 HISTORY — DX: Unspecified osteoarthritis, unspecified site: M19.90

## 2012-10-03 HISTORY — DX: Fibromyalgia: M79.7

## 2012-10-03 HISTORY — DX: Personal history of urinary calculi: Z87.442

## 2012-10-03 HISTORY — DX: Headache: R51

## 2012-10-03 LAB — CBC
HCT: 41.3 % (ref 39.0–52.0)
Hemoglobin: 13.9 g/dL (ref 13.0–17.0)
MCHC: 33.7 g/dL (ref 30.0–36.0)
RBC: 4.23 MIL/uL (ref 4.22–5.81)
WBC: 7.5 10*3/uL (ref 4.0–10.5)

## 2012-10-03 LAB — BASIC METABOLIC PANEL
BUN: 21 mg/dL (ref 6–23)
CO2: 30 mEq/L (ref 19–32)
Chloride: 98 mEq/L (ref 96–112)
Glucose, Bld: 98 mg/dL (ref 70–99)
Potassium: 4.6 mEq/L (ref 3.5–5.1)

## 2012-10-03 NOTE — Pre-Procedure Instructions (Signed)
PT'S BMET REPORT FAXED TO DR. MANNY'S OFFICE - CREATININE 1.51

## 2012-10-03 NOTE — Pre-Procedure Instructions (Signed)
PT ACTIVE - BUT NO EKG IN YEARS - IS 72 YRS OLD.  EKG WAS DONE TODAY - PREOP - AT [facility] AND NORMAL OTHER THAN RATE OF 50 - CXR NOT NEEDED.

## 2012-10-03 NOTE — Patient Instructions (Addendum)
YOUR SURGERY IS SCHEDULED AT [facility]  ON:  Friday  8/29  REPORT TO [place] [facility] AT:  8:00 AM      PHONE # FOR SHORT STAY IS [phone]  DO NOT EAT OR DRINK ANYTHING AFTER MIDNIGHT THE NIGHT BEFORE YOUR SURGERY.  YOU MAY BRUSH YOUR TEETH, RINSE OUT YOUR MOUTH--BUT NO WATER, NO FOOD, NO CHEWING GUM, NO MINTS, NO CANDIES, NO CHEWING TOBACCO.  PLEASE TAKE THE FOLLOWING MEDICATIONS THE AM OF YOUR SURGERY WITH A FEW SIPS OF WATER:  MAY TAKE OXYCODONE / ACETAMINOPHEN    DO NOT BRING VALUABLES, MONEY, CREDIT CARDS.  DO NOT WEAR JEWELRY, MAKE-UP, NAIL POLISH AND NO METAL PINS OR CLIPS IN YOUR HAIR. CONTACT LENS, DENTURES / PARTIALS, GLASSES SHOULD NOT BE WORN TO SURGERY AND IN MOST CASES-HEARING AIDS WILL NEED TO BE REMOVED.  BRING YOUR GLASSES CASE, ANY EQUIPMENT NEEDED FOR YOUR CONTACT LENS. FOR PATIENTS ADMITTED TO THE HOSPITAL--CHECK OUT TIME THE DAY OF DISCHARGE IS 11:00 AM.  ALL INPATIENT ROOMS ARE PRIVATE - WITH BATHROOM, TELEPHONE, TELEVISION AND WIFI INTERNET.  IF YOU ARE BEING DISCHARGED THE SAME DAY OF YOUR SURGERY--YOU CAN NOT DRIVE YOURSELF HOME--AND SHOULD NOT GO HOME ALONE BY TAXI OR BUS.  NO DRIVING OR OPERATING MACHINERY FOR 24 HOURS FOLLOWING ANESTHESIA / PAIN MEDICATIONS.  PLEASE MAKE ARRANGEMENTS FOR SOMEONE TO BE WITH YOU AT HOME THE FIRST 24 HOURS AFTER SURGERY. RESPONSIBLE DRIVER'S NAME  STEVE Kujawa                                               PHONE #   [phone]                               FAILURE TO FOLLOW THESE INSTRUCTIONS MAY RESULT IN THE CANCELLATION OF YOUR SURGERY.   PATIENT SIGNATURE_________________________________

## 2012-10-05 ENCOUNTER — Ambulatory Visit (HOSPITAL_COMMUNITY): Payer: Medicare Other | Admitting: Anesthesiology

## 2012-10-05 ENCOUNTER — Emergency Department (HOSPITAL_COMMUNITY)
Admission: EM | Admit: 2012-10-05 | Discharge: 2012-10-06 | Disposition: A | Discharge status: Home / Self Care | Payer: Medicare Other | Source: Home / Self Care | Attending: Emergency Medicine | Admitting: Emergency Medicine

## 2012-10-05 ENCOUNTER — Encounter (HOSPITAL_COMMUNITY)
Admission: RE | Disposition: A | Discharge status: Home / Self Care | Payer: Self-pay | Source: Ambulatory Visit | Attending: Urology

## 2012-10-05 ENCOUNTER — Encounter (HOSPITAL_COMMUNITY): Payer: Self-pay | Admitting: Anesthesiology

## 2012-10-05 ENCOUNTER — Encounter (HOSPITAL_COMMUNITY): Payer: Self-pay | Admitting: *Deleted

## 2012-10-05 ENCOUNTER — Encounter (HOSPITAL_COMMUNITY): Payer: Self-pay | Admitting: Emergency Medicine

## 2012-10-05 ENCOUNTER — Ambulatory Visit (HOSPITAL_COMMUNITY)
Admission: RE | Admit: 2012-10-05 | Discharge: 2012-10-05 | Disposition: A | Discharge status: Home / Self Care | Payer: Medicare Other | Source: Ambulatory Visit | Attending: Urology | Admitting: Urology

## 2012-10-05 DIAGNOSIS — Z86011 Personal history of benign neoplasm of the brain: Secondary | ICD-10-CM | POA: Insufficient documentation

## 2012-10-05 DIAGNOSIS — M129 Arthropathy, unspecified: Secondary | ICD-10-CM | POA: Insufficient documentation

## 2012-10-05 DIAGNOSIS — R319 Hematuria, unspecified: Secondary | ICD-10-CM

## 2012-10-05 DIAGNOSIS — N200 Calculus of kidney: Secondary | ICD-10-CM | POA: Insufficient documentation

## 2012-10-05 DIAGNOSIS — R339 Retention of urine, unspecified: Secondary | ICD-10-CM

## 2012-10-05 DIAGNOSIS — IMO0001 Reserved for inherently not codable concepts without codable children: Secondary | ICD-10-CM | POA: Insufficient documentation

## 2012-10-05 DIAGNOSIS — Z87442 Personal history of urinary calculi: Secondary | ICD-10-CM | POA: Insufficient documentation

## 2012-10-05 DIAGNOSIS — M19049 Primary osteoarthritis, unspecified hand: Secondary | ICD-10-CM | POA: Insufficient documentation

## 2012-10-05 DIAGNOSIS — Z885 Allergy status to narcotic agent status: Secondary | ICD-10-CM | POA: Insufficient documentation

## 2012-10-05 DIAGNOSIS — N1330 Unspecified hydronephrosis: Secondary | ICD-10-CM | POA: Diagnosis not present

## 2012-10-05 DIAGNOSIS — R300 Dysuria: Secondary | ICD-10-CM | POA: Insufficient documentation

## 2012-10-05 DIAGNOSIS — R109 Unspecified abdominal pain: Secondary | ICD-10-CM | POA: Insufficient documentation

## 2012-10-05 DIAGNOSIS — N201 Calculus of ureter: Secondary | ICD-10-CM | POA: Diagnosis not present

## 2012-10-05 DIAGNOSIS — Z91018 Allergy to other foods: Secondary | ICD-10-CM | POA: Insufficient documentation

## 2012-10-05 DIAGNOSIS — Z882 Allergy status to sulfonamides status: Secondary | ICD-10-CM | POA: Insufficient documentation

## 2012-10-05 HISTORY — PX: HOLMIUM LASER APPLICATION: SHX5852

## 2012-10-05 HISTORY — PX: CYSTOSCOPY/RETROGRADE/URETEROSCOPY/STONE EXTRACTION WITH BASKET: SHX5317

## 2012-10-05 HISTORY — PX: CYSTOSCOPY WITH RETROGRADE PYELOGRAM, URETEROSCOPY AND STENT PLACEMENT: SHX5789

## 2012-10-05 LAB — URINE MICROSCOPIC-ADD ON

## 2012-10-05 LAB — URINALYSIS, ROUTINE W REFLEX MICROSCOPIC
Glucose, UA: NEGATIVE mg/dL
Protein, ur: 100 mg/dL — AB
pH: 7 (ref 5.0–8.0)

## 2012-10-05 SURGERY — HOLMIUM LASER APPLICATION
Anesthesia: General | Laterality: Right

## 2012-10-05 MED ORDER — OXYCODONE-ACETAMINOPHEN 5-325 MG PO TABS
1.0000 | ORAL_TABLET | ORAL | Status: AC | PRN
  Administered 2012-10-05 (×2): 1 via ORAL
  Filled 2012-10-05 (×2): qty 1

## 2012-10-05 MED ORDER — PROMETHAZINE HCL 25 MG/ML IJ SOLN: 6.2500 mg | INTRAMUSCULAR | Status: DC | PRN

## 2012-10-05 MED ORDER — HYDROMORPHONE HCL PF 1 MG/ML IJ SOLN
1.0000 mg | Freq: Once | INTRAMUSCULAR | Status: AC
  Administered 2012-10-05: 1 mg via INTRAVENOUS
  Filled 2012-10-05: qty 1

## 2012-10-05 MED ORDER — PROPOFOL 10 MG/ML IV BOLUS
INTRAVENOUS | Status: DC | PRN
  Administered 2012-10-05: 150 mg via INTRAVENOUS

## 2012-10-05 MED ORDER — SODIUM CHLORIDE 0.9 % IR SOLN
Status: DC | PRN
  Administered 2012-10-05: 3000 mL via INTRAVESICAL
  Administered 2012-10-05: 6000 mL via INTRAVESICAL

## 2012-10-05 MED ORDER — ONDANSETRON HCL 4 MG/2ML IJ SOLN
4.0000 mg | Freq: Once | INTRAMUSCULAR | Status: AC
  Administered 2012-10-05: 4 mg via INTRAVENOUS
  Filled 2012-10-05: qty 2

## 2012-10-05 MED ORDER — MIDAZOLAM HCL 5 MG/5ML IJ SOLN
INTRAMUSCULAR | Status: DC | PRN
  Administered 2012-10-05: .5 mg via INTRAVENOUS

## 2012-10-05 MED ORDER — OXYBUTYNIN CHLORIDE 5 MG PO TABS
5.0000 mg | ORAL_TABLET | Freq: Once | ORAL | Status: AC
  Administered 2012-10-05: 5 mg via ORAL
  Filled 2012-10-05: qty 1

## 2012-10-05 MED ORDER — FENTANYL CITRATE 0.05 MG/ML IJ SOLN
INTRAMUSCULAR | Status: DC | PRN
  Administered 2012-10-05 (×4): 50 ug via INTRAVENOUS

## 2012-10-05 MED ORDER — OXYBUTYNIN CHLORIDE 5 MG PO TABS: 5.0000 mg | ORAL_TABLET | Freq: Three times a day (TID) | ORAL | Status: DC | PRN

## 2012-10-05 MED ORDER — SENNOSIDES-DOCUSATE SODIUM 8.6-50 MG PO TABS: 1.0000 | ORAL_TABLET | Freq: Two times a day (BID) | ORAL | Status: DC

## 2012-10-05 MED ORDER — DEXAMETHASONE SODIUM PHOSPHATE 10 MG/ML IJ SOLN
INTRAMUSCULAR | Status: DC | PRN
  Administered 2012-10-05: 10 mg via INTRAVENOUS

## 2012-10-05 MED ORDER — OXYCODONE-ACETAMINOPHEN 5-325 MG PO TABS: 1.0000 | ORAL_TABLET | ORAL | Status: DC | PRN

## 2012-10-05 MED ORDER — ONDANSETRON HCL 4 MG/2ML IJ SOLN
INTRAMUSCULAR | Status: DC | PRN
  Administered 2012-10-05 (×2): 2 mg via INTRAVENOUS

## 2012-10-05 MED ORDER — IOHEXOL 300 MG/ML  SOLN
INTRAMUSCULAR | Status: AC
  Filled 2012-10-05: qty 1

## 2012-10-05 MED ORDER — GENTAMICIN SULFATE 40 MG/ML IJ SOLN
390.0000 mg | Freq: Once | INTRAVENOUS | Status: AC
  Administered 2012-10-05: 385.5 mg via INTRAVENOUS
  Filled 2012-10-05: qty 9.75

## 2012-10-05 MED ORDER — EPHEDRINE SULFATE 50 MG/ML IJ SOLN
INTRAMUSCULAR | Status: DC | PRN
  Administered 2012-10-05 (×5): 5 mg via INTRAVENOUS

## 2012-10-05 MED ORDER — LACTATED RINGERS IV SOLN
INTRAVENOUS | Status: DC
  Administered 2012-10-05: 1000 mL via INTRAVENOUS
  Administered 2012-10-05 (×2): via INTRAVENOUS

## 2012-10-05 MED ORDER — IOHEXOL 300 MG/ML  SOLN
INTRAMUSCULAR | Status: DC | PRN
  Administered 2012-10-05: 25 mL via URETHRAL

## 2012-10-05 MED ORDER — LIDOCAINE HCL (CARDIAC) 20 MG/ML IV SOLN
INTRAVENOUS | Status: DC | PRN
  Administered 2012-10-05: 75 mg via INTRAVENOUS

## 2012-10-05 MED ORDER — FENTANYL CITRATE 0.05 MG/ML IJ SOLN: 25.0000 ug | INTRAMUSCULAR | Status: DC | PRN

## 2012-10-05 MED ORDER — PHENYLEPHRINE HCL 10 MG/ML IJ SOLN
INTRAMUSCULAR | Status: DC | PRN
  Administered 2012-10-05: 20 ug via INTRAVENOUS

## 2012-10-05 MED ORDER — KETOROLAC TROMETHAMINE 30 MG/ML IJ SOLN: 15.0000 mg | Freq: Once | INTRAMUSCULAR | Status: DC | PRN

## 2012-10-05 MED ORDER — SODIUM CHLORIDE 0.9 % IV SOLN
Freq: Once | INTRAVENOUS | Status: AC
  Administered 2012-10-05: via INTRAVENOUS

## 2012-10-05 SURGICAL SUPPLY — 24 items
BAG URO CATCHER STRL LF (DRAPE) ×4 IMPLANT
BASKET LASER NITINOL 1.9FR (BASKET) ×4 IMPLANT
BASKET ZERO TIP NITINOL 2.4FR (BASKET) IMPLANT
BSKT STON RTRVL 120 1.9FR (BASKET) ×6
BSKT STON RTRVL ZERO TP 2.4FR (BASKET)
CATH INTERMIT  6FR 70CM (CATHETERS) ×2 IMPLANT
CATH URET 5FR 28IN OPEN ENDED (CATHETERS) IMPLANT
CLOTH BEACON ORANGE TIMEOUT ST (SAFETY) ×4 IMPLANT
DRAPE CAMERA CLOSED 9X96 (DRAPES) ×4 IMPLANT
FIBER LASER TRAC TIP (UROLOGICAL SUPPLIES) ×2 IMPLANT
GLOVE BIOGEL M STRL SZ7.5 (GLOVE) ×4 IMPLANT
GOWN PREVENTION PLUS XLARGE (GOWN DISPOSABLE) ×2 IMPLANT
GOWN STRL NON-REIN LRG LVL3 (GOWN DISPOSABLE) ×7 IMPLANT
GOWN STRL REIN XL XLG (GOWN DISPOSABLE) ×6 IMPLANT
GUIDEWIRE ANG ZIPWIRE 038X150 (WIRE) ×4 IMPLANT
GUIDEWIRE STR DUAL SENSOR (WIRE) ×4 IMPLANT
MANIFOLD NEPTUNE II (INSTRUMENTS) ×4 IMPLANT
PACK CYSTO (CUSTOM PROCEDURE TRAY) ×4 IMPLANT
SHEATH ACCESS URETERAL 38CM (SHEATH) ×2 IMPLANT
STENT POLARIS 5FRX26 (STENTS) ×4 IMPLANT
SYR 20CC LL (SYRINGE) ×2 IMPLANT
SYRINGE IRR TOOMEY STRL 70CC (SYRINGE) ×3 IMPLANT
TUBE FEEDING 8FR 16IN STR KANG (MISCELLANEOUS) ×4 IMPLANT
TUBING CONNECTING 10 (TUBING) ×4 IMPLANT

## 2012-10-05 NOTE — H&P (Signed)
[name] is an 72 y.o. male.    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 No CV disesae. No strong blood thinners. 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  Today [name] is seen to proceed with first stage bilateral ureteroscopic stone manipuation. No interval fevers. Most recent UA w/o infectious parameters.  Past Medical History  Diagnosis Date  . Brain tumor     age 40 - brain tumor removed left- benign - occas slight headaches since the surgery  . Fibromyalgia   . History of kidney stones     HX OF MULTIPLE KIDNEY STONES AND  CURRENTLY HAS BILATERAL RENAL STONES CAUSING ABDOMINAL AND BACK PAIN  . Headache(784.0)   . 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 Brain tumor excision    . Lithotripsy    . Colon surgery  2001    COLON RESECTION FOR GROWTH - NOT CANCER  . Tonsillectomy      No family history on file. Social History:  reports that he has never smoked. He has never used smokeless tobacco. He reports that  drinks alcohol. He reports that he does not use illicit drugs.  Allergies:  Allergies  Allergen Reactions  . Other Other (See Comments)    mangos  . Sulfa Antibiotics Hives  . Vicodin [Hydrocodone-Acetaminophen] Itching    Patient prefers oxycodone    No prescriptions prior to admission    Results for orders placed during the hospital encounter of 10/03/12 (from the past 48 hour(s))  CBC     Status: None   Collection Time    10/03/12  8:50 AM      Result Value Range   WBC 7.5  4.0 - 10.5 K/uL   RBC 4.23  4.22 - 5.81 MIL/uL    Hemoglobin 13.9  13.0 - 17.0 g/dL   HCT 10.2  72.5 - 36.6 %   MCV 97.6  78.0 - 100.0 fL   MCH 32.9  26.0 - 34.0 pg   MCHC 33.7  30.0 - 36.0 g/dL   RDW 44.0  34.7 - 42.5 %   Platelets 316  150 - 400 K/uL  BASIC METABOLIC PANEL     Status: Abnormal   Collection Time    10/03/12  8:50 AM      Result Value Range   Sodium 136  135 - 145 mEq/L   Potassium 4.6  3.5 - 5.1 mEq/L   Chloride 98  96 - 112 mEq/L   CO2 30  19 - 32 mEq/L   Glucose, Bld 98  70 - 99 mg/dL   BUN 21  6 - 23 mg/dL   Creatinine, Ser 9.56 (*) 0.50 - 1.35 mg/dL   Calcium 9.7  8.4 - 38.7 mg/dL   GFR calc non Af Amer 45 (*) >90 mL/min   GFR calc Af Amer 52 (*) >90 mL/min   Comment: (NOTE)     The eGFR has been calculated using the CKD EPI equation.     This calculation has  not been validated in all clinical situations.     eGFR's persistently <90 mL/min signify possible Chronic Kidney     Disease.   No results found.  Review of Systems  Constitutional: Negative.  Negative for fever, chills and malaise/fatigue.  HENT: Negative.   Eyes: Negative.   Respiratory: Negative.   Cardiovascular: Negative.   Gastrointestinal: Negative.   Genitourinary: Negative.  Negative for hematuria and flank pain.  Musculoskeletal: Negative.   Skin: Negative.   Neurological: Negative.   Endo/Heme/Allergies: Negative.   Psychiatric/Behavioral: Negative.     Height 6\' 1"  (1.854 m), weight 77.111 kg (170 lb). Physical Exam  Constitutional: He is oriented to person, place, and time. He appears well-developed and well-nourished.  Very vigorous for age  HENT:  Head: Normocephalic and atraumatic.  Eyes: EOM are normal. Pupils are equal, round, and reactive to light.  Neck: Normal range of motion. Neck supple.  Cardiovascular: Normal rate and regular rhythm.   Respiratory: Effort normal and breath sounds normal.  GI: Soft. Bowel sounds are normal.  Genitourinary: Penis normal.  Musculoskeletal: Normal range of motion.   Neurological: He is alert and oriented to person, place, and time.  Skin: Skin is warm and dry.  Psychiatric: He has a normal mood and affect. His behavior is normal. Judgment and thought content normal.     Assessment/Plan  1 - Recurrent Nephrolithiasis - Proceed today as planned with 1st stage surgery  We rediscussed ureteroscopic stone manipulation with basketing and laser-lithotripsy in detail.  We rediscussed risks including bleeding, infection, damage to kidney / ureter  bladder, rarely loss of kidney. We rediscussed anesthetic risks and rare but serious surgical complications including DVT, PE, MI, and mortality. We specifically readdressed that in 5-10% of cases a staged approach is required with stenting followed by re-attempt ureteroscopy if anatomy unfavorable. The patient voiced understanding and wises to proceed.   Will plan for 2 stage bilateral URS separated by 2 weeks. First stage adress majority Rt stone and bilateral stent, second stage adress left stone and any residual right side.  2 - Metabolic Stone Disease - partial eval complete, continue work-up.    [name] 10/05/2012, 6:20 AM

## 2012-10-05 NOTE — Preoperative (Signed)
Beta Blockers   Reason not to administer Beta Blockers:Not Applicable 

## 2012-10-05 NOTE — ED Notes (Signed)
Bladder scan -200mL ?

## 2012-10-05 NOTE — Brief Op Note (Signed)
10/05/2012  12:28 PM  PATIENT:  [name]  72 y.o. male  PRE-OPERATIVE DIAGNOSIS:  Bilateral Renal Stones  POST-OPERATIVE DIAGNOSIS:  bilateral renal stones  PROCEDURE:  Procedure(s): HOLMIUM LASER APPLICATION (Right) CYSTOSCOPY/RETROGRADE/URETEROSCOPY/STONE EXTRACTION WITH BASKET (Right) CYSTOSCOPY WITH RETROGRADE PYELOGRAM, URETEROSCOPY AND STENT PLACEMENT (Left)  SURGEON:  Surgeon(s) and Role:    * Sebastian Ache, MD - Primary  PHYSICIAN ASSISTANT:   ASSISTANTS: none   ANESTHESIA:   general  EBL:  Total I/O In: 1000 [I.V.:1000] Out: -   BLOOD ADMINISTERED:none  DRAINS: none   LOCAL MEDICATIONS USED:  NONE  SPECIMEN:  Source of Specimen:  Rt Ureteral and Renal Stones  DISPOSITION OF SPECIMEN:  [facility] for compositional analysis  COUNTS:  YES  TOURNIQUET:  * No tourniquets in log *  DICTATION: .Other Dictation: Dictation Number [id number]  PLAN OF CARE: Discharge to home after PACU  PATIENT DISPOSITION:  PACU - hemodynamically stable.   Delay start of Pharmacological VTE agent (>24hrs) due to surgical blood loss or risk of bleeding: not applicable

## 2012-10-05 NOTE — Transfer of Care (Signed)
Immediate Anesthesia Transfer of Care Note  Patient: [name]  Procedure(s) Performed: Procedure(s): HOLMIUM LASER APPLICATION (Right) CYSTOSCOPY/RETROGRADE/URETEROSCOPY/STONE EXTRACTION WITH BASKET (Right) CYSTOSCOPY WITH RETROGRADE PYELOGRAM, URETEROSCOPY AND STENT PLACEMENT (Left)  Patient Location: PACU  Anesthesia Type:General  Level of Consciousness: awake, oriented, lethargic and responds to stimulation  Airway & Oxygen Therapy: Patient Spontanous Breathing  Post-op Assessment: Report given to PACU RN, Post -op Vital signs reviewed and stable and Patient moving all extremities  Post vital signs: Reviewed and stable  Complications: No apparent anesthesia complications

## 2012-10-05 NOTE — Anesthesia Postprocedure Evaluation (Signed)
  Anesthesia Post-op Note  Patient: LANGLEY INGALLS  Procedure(s) Performed: Procedure(s) (LRB): HOLMIUM LASER APPLICATION (Right) CYSTOSCOPY/RETROGRADE/URETEROSCOPY/STONE EXTRACTION WITH BASKET (Right) CYSTOSCOPY WITH RETROGRADE PYELOGRAM, URETEROSCOPY AND STENT PLACEMENT (Left)  Patient Location: PACU  Anesthesia Type: General  Level of Consciousness: awake and alert   Airway and Oxygen Therapy: Patient Spontanous Breathing  Post-op Pain: mild  Post-op Assessment: Post-op Vital signs reviewed, Patient's Cardiovascular Status Stable, Respiratory Function Stable, Patent Airway and No signs of Nausea or vomiting  Last Vitals:  Filed Vitals:   10/05/12 1327  BP: 138/107  Pulse: 71  Temp: 36.4 C  Resp: 16    Post-op Vital Signs: stable   Complications: No apparent anesthesia complications

## 2012-10-05 NOTE — ED Notes (Addendum)
Pt/family reports having stones taken out of the right kidney and had urinary stents placed earlier today here at [facility]. Pt reports being unable to void while at home, however has a dribble that is bloody in appearance. Pt also reports passing a small stone after surgery. Family called [facility] and the pt was instructed to present to the ED.

## 2012-10-05 NOTE — Anesthesia Preprocedure Evaluation (Signed)
Anesthesia Evaluation  Patient identified by MRN, date of birth, ID band Patient awake    Reviewed: Allergy & Precautions, H&P , NPO status , Patient's Chart, lab work & pertinent test results  Airway Mallampati: II TM Distance: >3 FB Neck ROM: Full    Dental no notable dental hx.    Pulmonary neg pulmonary ROS,  breath sounds clear to auscultation  Pulmonary exam normal       Cardiovascular negative cardio ROS  Rhythm:Regular Rate:Normal     Neuro/Psych negative neurological ROS  negative psych ROS   GI/Hepatic negative GI ROS, Neg liver ROS,   Endo/Other  negative endocrine ROS  Renal/GU negative Renal ROS  negative genitourinary   Musculoskeletal negative musculoskeletal ROS (+)   Abdominal   Peds negative pediatric ROS (+)  Hematology negative hematology ROS (+)   Anesthesia Other Findings   Reproductive/Obstetrics negative OB ROS                           Anesthesia Physical Anesthesia Plan  ASA: I  Anesthesia Plan: General   Post-op Pain Management:    Induction: Intravenous  Airway Management Planned: LMA  Additional Equipment:   Intra-op Plan:   Post-operative Plan: Extubation in OR  Informed Consent: I have reviewed the patients History and Physical, chart, labs and discussed the procedure including the risks, benefits and alternatives for the proposed anesthesia with the patient or authorized representative who has indicated his/her understanding and acceptance.   Dental advisory given  Plan Discussed with: CRNA and Surgeon  Anesthesia Plan Comments:         Anesthesia Quick Evaluation  

## 2012-10-06 NOTE — Op Note (Deleted)
[name], [name] NO.:  [id number]  MEDICAL RECORD NO.:  [id number]  LOCATION:  WA04                         FACILITY:  [facility]  PHYSICIAN:  Alex Ache, MD     DATE OF BIRTH:  [birth year]  DATE OF PROCEDURE: 10/05/2012 DATE OF DISCHARGE:  10/06/2012                              OPERATIVE REPORT   DIAGNOSES:  Right greater than left nephrolithiasis, right ureteral stone with hydronephrosis, and flank pain.  PROCEDURES: 1. Cystoscopy with bilateral retrograde pyelogram interpretation. 2. Bilateral ureteral stent placement, 5 x 26 Polaris, no tether. 3. Right ureteroscopy with laser lithotripsy.  ESTIMATED BLOOD LOSS:  Nil.  FINDINGS: 1. Large impacted right distal ureteral stone. 2. Multifocal large-volume right intrarenal stone. 3. Unremarkable left retrograde pyelogram. 4. Trabeculated bladder with multiple cellules. 5. Mild trilobar prostatic hypertrophy.  SPECIMENS:  Right ureteral and renal stones for compositional analysis.  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  He was found, on evaluation, to have right greater than left multifocal nephrolithiasis including right, very large, at least 8-mm distal ureteral stone.  Options were discussed including shockwave lithotripsy versus medical expulsive therapy versus ureteroscopy addressing just the right side ureteral and renal stones or all stones in a staged fashion.  He wished to proceed with the latter.  We had previously discussed we will be addressing the right side today as it was obstructed.  Informed consent was obtained and placed in the medical record.  PROCEDURE IN DETAIL:  The patient being [name] was verified and the procedure being cystoscopy with bilateral retrograde pyelograms and right ureteroscopic stone manipulation was confirmed.  Procedure was carried out.  Time-out was  performed.  Intravenous antibiotics administered.  General LMA anesthesia was introduced.  The patient was placed into a low lithotomy position.  Sterile field was created by prepping and draping the patient's penis, perineum, proximal thighs using iodine x3.  Next, cystourethroscopy was performed using a 22- French rigid cystoscope with 12-degree offset lens.  Inspection of the anterior and posterior urethra revealed only mild trilobar prostatic hypertrophy.  Inspection of the bladder revealed no diverticula, calcifications, or papular lesions.  There were multiple cellules trabeculation present with possible mild outlet obstruction.  The right ureteral orifice was then gently cannulated with a 6-French end-hole catheter and right retrograde pyelogram was obtained.  Right retrograde pyelogram demonstrated a single right ureter, single system, right kidney.  There was a large filling defect in the distal ureter consistent with known large distal stone.  There was minimal hydronephrosis above this and several filling defects and calcifications overlying the right kidney.  A 0.03 Glidewire was advanced at the level of the upper pole and set aside as a safety wire.  Next, semi-rigid ureteroscopy was performed on the right distal ureter with a 6.4-French semi-rigid ureteroscope along with a separate Sensor working wire and an 8-French feeding tube in urinary bladder for pressure release.  Indeed, there was a very large distal impacted stone.  This appeared to be much too large to basketing.  As such, holmium laser energy was applied using settings of 0.5 joules and  20 hertz, ablating the stone to approximately 10 smaller pieces.  These were then grasped sequentially with an escape basket brought to the level of urinary bladder.  Next, semi-rigid ureteroscopy was performed proximal to this.   Next, the semi-rigid ureteroscope was exchanged with a 38-cm 12/14 ureteral access sheath over the  Sensor working wire.  This was placed on continuous fluoroscopic vision to the level of the proximal ureter. Next, flexible ureteroscopy was performed using 8-French digital ureteroscope on the right side.  Inspection of the proximal ureter revealed no mucosal abnormalities or calcifications.  Systematic inspection of the kidney revealed multifocal stones, most large in the lower pole of the kidney.  The 2 largest dominant fragments were repositioned into the upper pole calyx for better access. Several smaller calcifications were just grasped and brought out in their entirety.  Holmium laser energy was then applied to these larger stones, which had been repositioned using the settings of 0.5 joules and 10 hertz, ablating each into approximately 3 smaller fragments, which were then grasped and brought out in their entirety.  Repeat systematic inspection of the entire right kidney revealed complete resolution of all fragments larger than 1/3 milimeter.  No mucosal abnormalities, no evidence of perforation.  The sheath was removed under continuous ureteroscopic vision and no mucosal abnormalities were found.  A new 5 x 26 Polaris stent was then placed over the remaining safety wire using cystoscopic and fluoroscopic guidance.  Good proximal and distal curl were noted.  The previous distal right ureteral stone fragments removed from the urinary bladder were then irrigated and set aside.  Attention was then directed to the left side.  The left ureteral orifice was cannulated with a 6-French end-hole catheter, and left retrograde pyelogram was obtained.  Left retrograde pyelogram demonstrated a single left ureter, single system, left kidney.  No filling defects or narrowing noted.  A 0.03 Glidewire was advanced at the level of the upper pole, over which a new 5 x 26 Polaris stent was placed on the left side.  Proximal and distal curl were noted.  This was placed to allow for passive dilation  and greater ease and safety of planned left ureteroscopic stone manipulation next month.  Bladder was emptied per cystoscope.  Procedure was then terminated.  The patient tolerated the procedure well.  There were no immediate periprocedural complications.  The patient was taken to the postanesthesia care unit in stable condition.          ______________________________ Alex Ache, MD     TM/MEDQ  D:  10/05/2012  T:  10/06/2012  Job:  [id number]

## 2012-10-06 NOTE — ED Provider Notes (Signed)
CSN: [id number]     Arrival date & time 10/05/12  2244 History   First MD Initiated Contact with Patient 10/05/12 2258     Chief Complaint  Patient presents with  . Post-op Problem  . Urinary Retention   (Consider location/radiation/quality/duration/timing/severity/associated sxs/prior Treatment) Patient is a 72 y.o. male presenting with abdominal pain. The history is provided by the patient. No language interpreter was used.  Abdominal Pain Pain location:  Suprapubic Associated symptoms: dysuria and hematuria   Associated symptoms: no chills, no fever, no nausea and no vomiting   Associated symptoms comment:  Suprapubic pain since having ureteral stent placed earlier this morning. He has a history of kidney stones requiring intervention. No fever, N or V. He reports being unable to urinate since the procedure today except for drops of urine that is bloody.    Past Medical History  Diagnosis Date  . 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 Fibromyalgia   . History of kidney stones     HX OF MULTIPLE KIDNEY STONES AND  CURRENTLY HAS BILATERAL RENAL STONES CAUSING ABDOMINAL AND BACK PAIN  . Headache(784.0)   . 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 Brain tumor excision    . Lithotripsy    . Colon surgery  2001    COLON RESECTION FOR GROWTH - NOT CANCER  . Tonsillectomy     No family history on file. History  Substance Use Topics  . Smoking status: Never Smoker   . Smokeless tobacco: Never Used  . Alcohol Use: Yes     Comment: OCCAS DRINK - BUT NOT DAILY    Review of Systems  Constitutional: Negative for fever and chills.  Gastrointestinal: Positive for abdominal pain. Negative for nausea and vomiting.  Genitourinary: Positive for dysuria, hematuria and difficulty urinating.  Musculoskeletal: Negative.   Skin: Negative.   Neurological: Negative.   Negative for weakness.  Psychiatric/Behavioral: Negative.  Negative for confusion.    Allergies  Other; Sulfa antibiotics; Vicodin; and Erythromycin  Home Medications   Current Outpatient Rx  Name  Route  Sig  Dispense  Refill  . ibuprofen (ADVIL,MOTRIN) 200 MG tablet   Oral   Take 600 mg by mouth every 8 (eight) hours as needed for pain. For pain         . oxybutynin (DITROPAN) 5 MG tablet   Oral   Take 1 tablet (5 mg total) by mouth every 8 (eight) hours as needed (For bladder spasms / stent discomfort).   20 tablet   2   . oxyCODONE-acetaminophen (PERCOCET/ROXICET) 5-325 MG per tablet   Oral   Take 1 tablet by mouth every 4 (four) hours as needed for pain.   40 tablet   0   . senna-docusate (SENOKOT-S) 8.6-50 MG per tablet   Oral   Take 1 tablet by mouth 2 (two) times daily. While taking pain meds to prevent constipation   30 tablet   1    BP 159/86  Pulse 94  Temp(Src) 98.4 F (36.9 C) (Oral)  Resp 26  SpO2 100% Physical Exam  Constitutional: He is oriented to person, place, and time. He appears well-developed and well-nourished.  Uncomfortable appearing.  HENT:  Head: Normocephalic.  Neck: Normal range of motion. Neck supple.  Pulmonary/Chest: Effort normal.  Abdominal: Soft. Bowel sounds are  normal. There is tenderness. There is no rebound and no guarding.  Tender, distended lower abdomen.  Musculoskeletal: Normal range of motion.  Neurological: He is alert and oriented to person, place, and time.  Skin: Skin is warm and dry. No rash noted.  Psychiatric: He has a normal mood and affect.    ED Course  Procedures (including critical care time) Labs Review Labs Reviewed  URINALYSIS, ROUTINE W REFLEX MICROSCOPIC - Abnormal; Notable for the following:    Color, Urine RED (*)    APPearance TURBID (*)    Hgb urine dipstick LARGE (*)    Protein, ur 100 (*)    Leukocytes, UA MODERATE (*)    All other components within normal limits  URINE CULTURE   URINE MICROSCOPIC-ADD ON   Imaging Review No results found.  MDM  No diagnosis found. 1. Urinary retention 2. Hematuria  S/P ureteral stenting today without fever, N, V.Doubt infection, suspect post-procedure retention. Will leave foley catheter in place until urology follow up on Tuesday. Return precautions given.    Arnoldo Hooker, PA-C 10/06/12 [id number]

## 2012-10-06 NOTE — Op Note (Signed)
NAME:  [name], [name]             ACCOUNT NO.:  [id number]  MEDICAL RECORD NO.:  [id number]  LOCATION:  WA04                         FACILITY:  [facility]  PHYSICIAN:  Ethan Kasperski, MD     DATE OF BIRTH:  09/07/1940  DATE OF PROCEDURE: 10/05/2012 DATE OF DISCHARGE:  10/06/2012                              OPERATIVE REPORT   DIAGNOSES:  Right greater than left nephrolithiasis, right ureteral stone with hydronephrosis, and flank pain.  PROCEDURES: 1. Cystoscopy with bilateral retrograde pyelogram interpretation. 2. Bilateral ureteral stent placement, 5 x 26 Polaris, no tether. 3. Right ureteroscopy with laser lithotripsy.  ESTIMATED BLOOD LOSS:  Nil.  FINDINGS: 1. Large impacted right distal ureteral stone. 2. Multifocal large-volume right intrarenal stone. 3. Unremarkable left retrograde pyelogram. 4. Trabeculated bladder with multiple cellules. 5. Mild trilobar prostatic hypertrophy.  SPECIMENS:  Right ureteral and renal stones for compositional analysis.  INDICATION:  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  He was found, on evaluation, to have right greater than left multifocal nephrolithiasis including right, very large, at least 8-mm distal ureteral stone.  Options were discussed including shockwave lithotripsy versus medical expulsive therapy versus ureteroscopy addressing just the right side ureteral and renal stones or all stones in a staged fashion.  He wished to proceed with the latter.  We had previously discussed we will be addressing the right side today as it was obstructed.  Informed consent was obtained and placed in the medical record.  PROCEDURE IN DETAIL:  The patient being Lazer Leach was verified and the procedure being cystoscopy with bilateral retrograde pyelograms and right ureteroscopic stone manipulation was confirmed.  Procedure was carried out.  Time-out was  performed.  Intravenous antibiotics administered.  General LMA anesthesia was introduced.  The patient was placed into a low lithotomy position.  Sterile field was created by prepping and draping the patient's penis, perineum, proximal thighs using iodine x3.  Next, cystourethroscopy was performed using a 22- French rigid cystoscope with 12-degree offset lens.  Inspection of the anterior and posterior urethra revealed only mild trilobar prostatic hypertrophy.  Inspection of the bladder revealed no diverticula, calcifications, or papular lesions.  There were multiple cellules trabeculation present with possible mild outlet obstruction.  The right ureteral orifice was then gently cannulated with a 6-French end-hole catheter and right retrograde pyelogram was obtained.  Right retrograde pyelogram demonstrated a single right ureter, single system, right kidney.  There was a large filling defect in the distal ureter consistent with known large distal stone.  There was minimal hydronephrosis above this and several filling defects and calcifications overlying the right kidney.  A 0.03 Glidewire was advanced at the level of the upper pole and set aside as a safety wire.  Next, semi-rigid ureteroscopy was performed on the right distal ureter with a 6.4-French semi-rigid ureteroscope along with a separate Sensor working wire and an 8-French feeding tube in urinary bladder for pressure release.  Indeed, there was a very large distal impacted stone.  This appeared to be much too large to basketing.  As such, holmium laser energy was applied using settings of 0.5 joules and   20 hertz, ablating the stone to approximately 10 smaller pieces.  These were then grasped sequentially with an escape basket brought to the level of urinary bladder.  Next, semi-rigid ureteroscopy was performed proximal to this.   Next, the semi-rigid ureteroscope was exchanged with a 38-cm 12/14 ureteral access sheath over the  Sensor working wire.  This was placed on continuous fluoroscopic vision to the level of the proximal ureter. Next, flexible ureteroscopy was performed using 8-French digital ureteroscope on the right side.  Inspection of the proximal ureter revealed no mucosal abnormalities or calcifications.  Systematic inspection of the kidney revealed multifocal stones, most large in the lower pole of the kidney.  The 2 largest dominant fragments were repositioned into the upper pole calyx for better access. Several smaller calcifications were just grasped and brought out in their entirety.  Holmium laser energy was then applied to these larger stones, which had been repositioned using the settings of 0.5 joules and 10 hertz, ablating each into approximately 3 smaller fragments, which were then grasped and brought out in their entirety.  Repeat systematic inspection of the entire right kidney revealed complete resolution of all fragments larger than 1/3 milimeter.  No mucosal abnormalities, no evidence of perforation.  The sheath was removed under continuous ureteroscopic vision and no mucosal abnormalities were found.  A new 5 x 26 Polaris stent was then placed over the remaining safety wire using cystoscopic and fluoroscopic guidance.  Good proximal and distal curl were noted.  The previous distal right ureteral stone fragments removed from the urinary bladder were then irrigated and set aside.  Attention was then directed to the left side.  The left ureteral orifice was cannulated with a 6-French end-hole catheter, and left retrograde pyelogram was obtained.  Left retrograde pyelogram demonstrated a single left ureter, single system, left kidney.  No filling defects or narrowing noted.  A 0.03 Glidewire was advanced at the level of the upper pole, over which a new 5 x 26 Polaris stent was placed on the left side.  Proximal and distal curl were noted.  This was placed to allow for passive dilation  and greater ease and safety of planned left ureteroscopic stone manipulation next month.  Bladder was emptied per cystoscope.  Procedure was then terminated.  The patient tolerated the procedure well.  There were no immediate periprocedural complications.  The patient was taken to the postanesthesia care unit in stable condition.          ______________________________ Ceejay Kegley, MD     TM/MEDQ  D:  10/05/2012  T:  10/06/2012  Job:  [id number] 

## 2012-10-07 LAB — URINE CULTURE
Colony Count: NO GROWTH
Culture: NO GROWTH

## 2012-10-08 NOTE — ED Provider Notes (Signed)
Medical screening examination/treatment/procedure(s) were performed by non-physician practitioner and as supervising physician I was immediately available for consultation/collaboration.    Harvy Riera J. Rya Rausch, MD 10/08/12 0726 

## 2012-10-09 ENCOUNTER — Encounter (HOSPITAL_COMMUNITY): Payer: Self-pay | Admitting: Urology

## 2012-10-09 DIAGNOSIS — R339 Retention of urine, unspecified: Secondary | ICD-10-CM | POA: Diagnosis not present

## 2012-10-09 DIAGNOSIS — N390 Urinary tract infection, site not specified: Secondary | ICD-10-CM | POA: Diagnosis not present

## 2012-10-15 DIAGNOSIS — N200 Calculus of kidney: Secondary | ICD-10-CM | POA: Diagnosis not present

## 2012-10-18 ENCOUNTER — Encounter (HOSPITAL_COMMUNITY): Payer: Self-pay | Admitting: *Deleted

## 2012-10-23 ENCOUNTER — Encounter (HOSPITAL_COMMUNITY): Payer: Self-pay | Admitting: Pharmacy Technician

## 2012-10-24 ENCOUNTER — Ambulatory Visit (HOSPITAL_COMMUNITY)
Admission: RE | Admit: 2012-10-24 | Discharge: 2012-10-24 | Disposition: A | Discharge status: Home / Self Care | Payer: Medicare Other | Source: Ambulatory Visit | Attending: Urology | Admitting: Urology

## 2012-10-24 ENCOUNTER — Encounter (HOSPITAL_COMMUNITY): Payer: Self-pay | Admitting: *Deleted

## 2012-10-24 ENCOUNTER — Encounter (HOSPITAL_COMMUNITY)
Admission: RE | Disposition: A | Discharge status: Home / Self Care | Payer: Self-pay | Source: Ambulatory Visit | Attending: Urology

## 2012-10-24 ENCOUNTER — Encounter (HOSPITAL_COMMUNITY): Payer: Self-pay | Admitting: Anesthesiology

## 2012-10-24 ENCOUNTER — Ambulatory Visit (HOSPITAL_COMMUNITY): Payer: Medicare Other | Admitting: Anesthesiology

## 2012-10-24 DIAGNOSIS — IMO0001 Reserved for inherently not codable concepts without codable children: Secondary | ICD-10-CM | POA: Insufficient documentation

## 2012-10-24 DIAGNOSIS — R339 Retention of urine, unspecified: Secondary | ICD-10-CM | POA: Diagnosis not present

## 2012-10-24 DIAGNOSIS — N200 Calculus of kidney: Secondary | ICD-10-CM | POA: Diagnosis not present

## 2012-10-24 HISTORY — PX: CYSTOSCOPY/RETROGRADE/URETEROSCOPY/STONE EXTRACTION WITH BASKET: SHX5317

## 2012-10-24 HISTORY — PX: HOLMIUM LASER APPLICATION: SHX5852

## 2012-10-24 LAB — CBC
HCT: 40.5 % (ref 39.0–52.0)
Hemoglobin: 13.8 g/dL (ref 13.0–17.0)
MCH: 32.4 pg (ref 26.0–34.0)
MCHC: 34.1 g/dL (ref 30.0–36.0)
MCV: 95.1 fL (ref 78.0–100.0)
RDW: 12 % (ref 11.5–15.5)

## 2012-10-24 LAB — BASIC METABOLIC PANEL
BUN: 15 mg/dL (ref 6–23)
Calcium: 9.6 mg/dL (ref 8.4–10.5)
Creatinine, Ser: 1.01 mg/dL (ref 0.50–1.35)
GFR calc Af Amer: 84 mL/min — ABNORMAL LOW (ref >90)
GFR calc non Af Amer: 73 mL/min — ABNORMAL LOW (ref >90)
Glucose, Bld: 99 mg/dL (ref 70–99)
Potassium: 4 mEq/L (ref 3.5–5.1)

## 2012-10-24 SURGERY — HOLMIUM LASER APPLICATION
Anesthesia: General | Laterality: Bilateral

## 2012-10-24 MED ORDER — DEXAMETHASONE SODIUM PHOSPHATE 10 MG/ML IJ SOLN
INTRAMUSCULAR | Status: DC | PRN
  Administered 2012-10-24: 10 mg via INTRAVENOUS

## 2012-10-24 MED ORDER — ONDANSETRON HCL 4 MG/2ML IJ SOLN
INTRAMUSCULAR | Status: DC | PRN
  Administered 2012-10-24: 4 mg via INTRAVENOUS

## 2012-10-24 MED ORDER — SENNOSIDES-DOCUSATE SODIUM 8.6-50 MG PO TABS: 1.0000 | ORAL_TABLET | Freq: Two times a day (BID) | ORAL | Status: DC

## 2012-10-24 MED ORDER — FINASTERIDE 5 MG PO TABS: 5.0000 mg | ORAL_TABLET | Freq: Every day | ORAL | Status: DC

## 2012-10-24 MED ORDER — EPHEDRINE SULFATE 50 MG/ML IJ SOLN
INTRAMUSCULAR | Status: DC | PRN
  Administered 2012-10-24 (×2): 10 mg via INTRAVENOUS

## 2012-10-24 MED ORDER — TAMSULOSIN HCL 0.4 MG PO CAPS: 0.4000 mg | ORAL_CAPSULE | Freq: Every day | ORAL | Status: DC

## 2012-10-24 MED ORDER — FENTANYL CITRATE 0.05 MG/ML IJ SOLN
INTRAMUSCULAR | Status: DC | PRN
  Administered 2012-10-24: 25 ug via INTRAVENOUS
  Administered 2012-10-24: 50 ug via INTRAVENOUS
  Administered 2012-10-24: 25 ug via INTRAVENOUS

## 2012-10-24 MED ORDER — PROPOFOL 10 MG/ML IV BOLUS
INTRAVENOUS | Status: DC | PRN
  Administered 2012-10-24: 150 mg via INTRAVENOUS

## 2012-10-24 MED ORDER — SODIUM CHLORIDE 0.9 % IR SOLN
Status: DC | PRN
  Administered 2012-10-24: 4000 mL

## 2012-10-24 MED ORDER — LACTATED RINGERS IV SOLN: INTRAVENOUS | Status: DC

## 2012-10-24 MED ORDER — FENTANYL CITRATE 0.05 MG/ML IJ SOLN
25.0000 ug | INTRAMUSCULAR | Status: DC | PRN
  Administered 2012-10-24: 25 ug via INTRAVENOUS
  Administered 2012-10-24: 50 ug via INTRAVENOUS
  Administered 2012-10-24 (×3): 25 ug via INTRAVENOUS

## 2012-10-24 MED ORDER — GENTAMICIN SULFATE 40 MG/ML IJ SOLN: 5.0000 mg/kg | Freq: Once | INTRAVENOUS | Status: DC

## 2012-10-24 MED ORDER — FENTANYL CITRATE 0.05 MG/ML IJ SOLN
INTRAMUSCULAR | Status: AC
  Filled 2012-10-24: qty 2

## 2012-10-24 MED ORDER — CIPROFLOXACIN HCL 250 MG PO TABS: 250.0000 mg | ORAL_TABLET | Freq: Two times a day (BID) | ORAL | Status: DC

## 2012-10-24 MED ORDER — HYDROMORPHONE HCL PF 1 MG/ML IJ SOLN
0.2500 mg | INTRAMUSCULAR | Status: DC | PRN
  Administered 2012-10-24 (×4): 0.5 mg via INTRAVENOUS

## 2012-10-24 MED ORDER — OXYCODONE-ACETAMINOPHEN 5-325 MG PO TABS
1.0000 | ORAL_TABLET | ORAL | Status: DC | PRN
  Administered 2012-10-24: 1 via ORAL
  Filled 2012-10-24: qty 1

## 2012-10-24 MED ORDER — HYDROMORPHONE HCL PF 1 MG/ML IJ SOLN
INTRAMUSCULAR | Status: DC
Start: 2012-10-24 — End: 2012-10-24
  Filled 2012-10-24: qty 1

## 2012-10-24 MED ORDER — IOHEXOL 300 MG/ML  SOLN
INTRAMUSCULAR | Status: DC | PRN
  Administered 2012-10-24: 10 mL

## 2012-10-24 MED ORDER — LIDOCAINE HCL (CARDIAC) 20 MG/ML IV SOLN
INTRAVENOUS | Status: DC | PRN
  Administered 2012-10-24: 100 mg via INTRAVENOUS

## 2012-10-24 MED ORDER — OXYCODONE-ACETAMINOPHEN 5-325 MG PO TABS: 1.0000 | ORAL_TABLET | ORAL | Status: DC | PRN

## 2012-10-24 MED ORDER — HYDROMORPHONE HCL PF 1 MG/ML IJ SOLN: 0.5000 mg | INTRAMUSCULAR | Status: DC | PRN

## 2012-10-24 MED ORDER — GENTAMICIN IN SALINE 1.6-0.9 MG/ML-% IV SOLN
80.0000 mg | INTRAVENOUS | Status: DC
  Filled 2012-10-24: qty 50

## 2012-10-24 MED ORDER — MIDAZOLAM HCL 5 MG/5ML IJ SOLN
INTRAMUSCULAR | Status: DC | PRN
  Administered 2012-10-24: 2 mg via INTRAVENOUS

## 2012-10-24 MED ORDER — OXYBUTYNIN CHLORIDE 5 MG PO TABS: 5.0000 mg | ORAL_TABLET | Freq: Three times a day (TID) | ORAL | Status: DC | PRN

## 2012-10-24 MED ORDER — 0.9 % SODIUM CHLORIDE (POUR BTL) OPTIME
TOPICAL | Status: DC | PRN
  Administered 2012-10-24: 1000 mL

## 2012-10-24 MED ORDER — LACTATED RINGERS IV SOLN
INTRAVENOUS | Status: DC
  Administered 2012-10-24: 1000 mL via INTRAVENOUS
  Administered 2012-10-24: 15:00:00 via INTRAVENOUS

## 2012-10-24 SURGICAL SUPPLY — 21 items
BAG URO CATCHER STRL LF (DRAPE) ×2 IMPLANT
BASKET LASER NITINOL 1.9FR (BASKET) ×1 IMPLANT
BSKT STON RTRVL 120 1.9FR (BASKET) ×1
CATH FOLEY 2W COUNCIL 5CC 18FR (CATHETERS) ×1 IMPLANT
CATH INTERMIT  6FR 70CM (CATHETERS) IMPLANT
CATH TIEMANN FOLEY 18FR 5CC (CATHETERS) ×1 IMPLANT
CATH URET 5FR 28IN OPEN ENDED (CATHETERS) ×1 IMPLANT
CLOTH BEACON ORANGE TIMEOUT ST (SAFETY) ×2 IMPLANT
DRAPE CAMERA CLOSED 9X96 (DRAPES) ×2 IMPLANT
GLOVE BIOGEL M STRL SZ7.5 (GLOVE) ×2 IMPLANT
GOWN PREVENTION PLUS LG XLONG (DISPOSABLE) ×2 IMPLANT
GOWN PREVENTION PLUS XLARGE (GOWN DISPOSABLE) ×2 IMPLANT
GUIDEWIRE ANG ZIPWIRE 038X150 (WIRE) ×2 IMPLANT
GUIDEWIRE STR DUAL SENSOR (WIRE) ×2 IMPLANT
LASER FIBER DISP (UROLOGICAL SUPPLIES) ×1 IMPLANT
MANIFOLD NEPTUNE II (INSTRUMENTS) ×2 IMPLANT
PACK CYSTO (CUSTOM PROCEDURE TRAY) ×2 IMPLANT
SHEATH ACCESS URETERAL 38CM (SHEATH) ×1 IMPLANT
STENT POLARIS 5FRX26 (STENTS) ×2 IMPLANT
TUBE FEEDING 8FR 16IN STR KANG (MISCELLANEOUS) ×2 IMPLANT
TUBING CONNECTING 10 (TUBING) ×2 IMPLANT

## 2012-10-24 NOTE — Transfer of Care (Signed)
Immediate Anesthesia Transfer of Care Note  Patient: [name]  Procedure(s) Performed: Procedure(s) with comments: HOLMIUM LASER APPLICATION (Bilateral) CYSTOSCOPY/RETROGRADE/URETEROSCOPY/STONE EXTRACTION WITH BASKET (Bilateral) - with bilateral stent placement  Patient Location: PACU  Anesthesia Type:General  Level of Consciousness: sedated  Airway & Oxygen Therapy: Patient Spontanous Breathing and Patient connected to face mask oxygen  Post-op Assessment: Report given to PACU RN and Post -op Vital signs reviewed and stable  Post vital signs: Reviewed and stable  Complications: No apparent anesthesia complications

## 2012-10-24 NOTE — Brief Op Note (Signed)
10/24/2012  3:32 PM  PATIENT:  [name]  72 y.o. male  PRE-OPERATIVE DIAGNOSIS:  Bilateral Renal Stones  POST-OPERATIVE DIAGNOSIS:  Bilateral Renal Stones  PROCEDURE:  Procedure(s) with comments: HOLMIUM LASER APPLICATION (Bilateral) CYSTOSCOPY/RETROGRADE/URETEROSCOPY/STONE EXTRACTION WITH BASKET (Bilateral) - with bilateral stent placement  SURGEON:  Surgeon(s) and Role:    * Sebastian Ache, MD - Primary  PHYSICIAN ASSISTANT:   ASSISTANTS: none   ANESTHESIA:   general  EBL:  Total I/O In: 1000 [I.V.:1000] Out: -   BLOOD ADMINISTERED:none  DRAINS: none   LOCAL MEDICATIONS USED:  NONE  SPECIMEN:  Source of Specimen:  Rt and Left Renal Stones  DISPOSITION OF SPECIMEN:  [facility] for compositional analysis  COUNTS:  YES  TOURNIQUET:  * No tourniquets in log *  DICTATION: .Other Dictation: Dictation Number [id number]  PLAN OF CARE: Discharge to home after PACU  PATIENT DISPOSITION:  PACU - hemodynamically stable.   Delay start of Pharmacological VTE agent (>24hrs) due to surgical blood loss or risk of bleeding: yes

## 2012-10-24 NOTE — Anesthesia Postprocedure Evaluation (Signed)
  Anesthesia Post-op Note  Patient: [name]  Procedure(s) Performed: Procedure(s) (LRB): HOLMIUM LASER APPLICATION (Bilateral) CYSTOSCOPY/RETROGRADE/URETEROSCOPY/STONE EXTRACTION WITH BASKET (Bilateral)  Patient Location: PACU  Anesthesia Type: General  Level of Consciousness: awake and alert   Airway and Oxygen Therapy: Patient Spontanous Breathing  Post-op Pain: mild  Post-op Assessment: Post-op Vital signs reviewed, Patient's Cardiovascular Status Stable, Respiratory Function Stable, Patent Airway and No signs of Nausea or vomiting  Last Vitals:  Filed Vitals:   10/24/12 1539  BP: 121/70  Pulse: 73  Temp: 36.8 C  Resp: 19    Post-op Vital Signs: stable   Complications: No apparent anesthesia complications

## 2012-10-24 NOTE — Progress Notes (Signed)
Patient, wife and son verbalized understanding of discharge instructions. Patient requested to change standard drainage bag to leg bag, Pt demonstrated on how to empty bag. Patient is stable at discharge.

## 2012-10-24 NOTE — Anesthesia Preprocedure Evaluation (Addendum)
Anesthesia Evaluation  Patient identified by MRN, date of birth, ID band Patient awake    Reviewed: Allergy & Precautions, H&P , NPO status , Patient's Chart, lab work & pertinent test results  Airway Mallampati: II TM Distance: >3 FB Neck ROM: full    Dental  (+) Edentulous Upper, Edentulous Lower and Dental Advisory Given   Pulmonary neg pulmonary ROS,  breath sounds clear to auscultation  Pulmonary exam normal       Cardiovascular Exercise Tolerance: Good negative cardio ROS  Rhythm:regular Rate:Normal     Neuro/Psych  Headaches, History brain tumor age 72 removed negative neurological ROS  negative psych ROS   GI/Hepatic negative GI ROS, Neg liver ROS,   Endo/Other  negative endocrine ROS  Renal/GU negative Renal ROS  negative genitourinary   Musculoskeletal   Abdominal   Peds  Hematology negative hematology ROS (+)   Anesthesia Other Findings   Reproductive/Obstetrics negative OB ROS                          Anesthesia Physical Anesthesia Plan  ASA: II  Anesthesia Plan: General   Post-op Pain Management:    Induction: Intravenous  Airway Management Planned: LMA  Additional Equipment:   Intra-op Plan:   Post-operative Plan:   Informed Consent: I have reviewed the patients History and Physical, chart, labs and discussed the procedure including the risks, benefits and alternatives for the proposed anesthesia with the patient or authorized representative who has indicated his/her understanding and acceptance.   Dental Advisory Given  Plan Discussed with: CRNA and Surgeon  Anesthesia Plan Comments:        Anesthesia Quick Evaluation

## 2012-10-24 NOTE — Progress Notes (Signed)
Spoke with Dr. Leta Jungling re' pt's cont c/o pain; order rec'd and med given

## 2012-10-24 NOTE — H&P (Signed)
[name] is an 72 y.o. male.    Chief Complaint: Pre op Bilateral Second Stage Ureteroscopic Stone Manipulation  HPI:   1 - Recurrent Nephrolithiasis Pre 2014 - 5 episodes managed wtih URS, SWL, MET  08/2012 - Rt 14mm UVJ + 9mm lower pole plus Left 4mm lower pole and punctate by ER CT 08/30/12 on w/u rt flank pain. Underwent first stage ureteroscopic stone manipulation 10/05/12 at which time he had majority of Rt sided stone treated and bilateral stents placed.  2 - Metabolic Stone Disease Eval 2014: BMP, Urate - normal; Composition - pending; 24hr Urines - pending  3 - Urinary Retention - Pt developed likely multifactorial urinary retention following first stage procedure. He is now managing with foley catheter. He has been on pain meds and anticholinergics for stent colic as well as alpha blockers. He does have prostatic hypertrophy.   PMH sig for fibromyalgia. No CV disesae. No strong blood thinners. 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  Today [name] is seen to proceed with second stage bilateral stone procedures. Most recent UCX negative 10/2012.  Past Medical History  Diagnosis Date  . 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 Fibromyalgia   . History of kidney stones     HX OF MULTIPLE KIDNEY STONES AND  CURRENTLY HAS BILATERAL RENAL STONES CAUSING ABDOMINAL AND BACK PAIN  . Headache(784.0)   . Arthritis     HANDS AND PT STATES HIS ARMS HURT  . Foley catheter in place     Past Surgical History  Procedure Laterality Date  . Brain tumor excision    . Lithotripsy    . Colon surgery  2001    COLON RESECTION FOR GROWTH - NOT CANCER  . Tonsillectomy    . Holmium laser application Right 10/05/2012    Procedure: HOLMIUM LASER APPLICATION;  Surgeon: Sebastian Ache, MD;  Location: [facility];  Service: Urology;  Laterality: Right;  . Cystoscopy/retrograde/ureteroscopy/stone extraction with basket Right 10/05/2012    Procedure:  CYSTOSCOPY/RETROGRADE/URETEROSCOPY/STONE EXTRACTION WITH BASKET;  Surgeon: Sebastian Ache, MD;  Location: [facility];  Service: Urology;  Laterality: Right;  . Cystoscopy with retrograde pyelogram, ureteroscopy and stent placement Left 10/05/2012    Procedure: CYSTOSCOPY WITH RETROGRADE PYELOGRAM, URETEROSCOPY AND STENT PLACEMENT;  Surgeon: Sebastian Ache, MD;  Location: [facility];  Service: Urology;  Laterality: Left;    History reviewed. No pertinent family history. Social History:  reports that he has never smoked. He has never used smokeless tobacco. He reports that  drinks alcohol. He reports that he does not use illicit drugs.  Allergies:  Allergies  Allergen Reactions  . Other Other (See Comments)    mangos  . Sulfa Antibiotics Hives  . Vicodin [Hydrocodone-Acetaminophen] Itching    Patient prefers oxycodone  . Erythromycin Rash    Many years ago an oral antibiotic caused a rash(wife and patient think it was erythromycin but they are not positive).    No prescriptions prior to admission    No results found for this or any previous visit (from the past 48 hour(s)). No results found.  Review of Systems  Constitutional: Negative.  Negative for fever and chills.  HENT: Negative.   Eyes: Negative.   Respiratory: Negative.   Cardiovascular: Negative.   Gastrointestinal: Negative.   Genitourinary: Negative.        Occasional bladder spasms with foley and stents  Musculoskeletal: Negative.   Skin: Negative.  Neurological: Negative.   Endo/Heme/Allergies: Negative.   Psychiatric/Behavioral: Negative.     Height 6' (1.829 m), weight 76.204 kg (168 lb). Physical Exam  Constitutional: He is oriented to person, place, and time. He appears well-developed and well-nourished.  HENT:  Head: Normocephalic and atraumatic.  Eyes: EOM are normal. Pupils are equal, round, and reactive to light.  Neck: Normal range of motion.  Cardiovascular: Normal rate and regular rhythm.   Respiratory:  Effort normal and breath sounds normal.  GI: Soft. Bowel sounds are normal.  Genitourinary: Penis normal.  Foley c/d/i with clear urine  Musculoskeletal: Normal range of motion.  Neurological: He is alert and oriented to person, place, and time.  Skin: Skin is warm and dry.  Psychiatric: He has a normal mood and affect. His behavior is normal. Judgment and thought content normal.     Assessment/Plan  1 - Recurrent Nephrolithiasis - Proceed today as planned with 2nd stage surgery to treat residual stone.  We rediscussed ureteroscopic stone manipulation with basketing and laser-lithotripsy in detail.  We rediscussed risks including bleeding, infection, damage to kidney / ureter  bladder, rarely loss of kidney. We rediscussed anesthetic risks and rare but serious surgical complications including DVT, PE, MI, and mortality. We specifically readdressed that in 5-10% of cases a staged approach is required with stenting followed by re-attempt ureteroscopy if anatomy unfavorable. The patient voiced understanding and wises to proceed.   2 - Metabolic Stone Disease - partial eval complete, continue work-up.  3 - Urinary Retention - Will repeat trial of void when stent free. Will begin finasteride post-op as well given large gland size.   Axel Meas 10/24/2012, 6:19 AM

## 2012-10-25 ENCOUNTER — Encounter (HOSPITAL_COMMUNITY): Payer: Self-pay

## 2012-10-25 ENCOUNTER — Emergency Department (HOSPITAL_COMMUNITY)
Admission: EM | Admit: 2012-10-25 | Discharge: 2012-10-25 | Disposition: A | Discharge status: Home / Self Care | Payer: Medicare Other | Attending: Emergency Medicine | Admitting: Emergency Medicine

## 2012-10-25 DIAGNOSIS — Z792 Long term (current) use of antibiotics: Secondary | ICD-10-CM | POA: Insufficient documentation

## 2012-10-25 DIAGNOSIS — N489 Disorder of penis, unspecified: Secondary | ICD-10-CM | POA: Insufficient documentation

## 2012-10-25 DIAGNOSIS — N4889 Other specified disorders of penis: Secondary | ICD-10-CM

## 2012-10-25 DIAGNOSIS — Y849 Medical procedure, unspecified as the cause of abnormal reaction of the patient, or of later complication, without mention of misadventure at the time of the procedure: Secondary | ICD-10-CM | POA: Insufficient documentation

## 2012-10-25 DIAGNOSIS — IMO0002 Reserved for concepts with insufficient information to code with codable children: Secondary | ICD-10-CM | POA: Insufficient documentation

## 2012-10-25 DIAGNOSIS — Z8669 Personal history of other diseases of the nervous system and sense organs: Secondary | ICD-10-CM | POA: Diagnosis not present

## 2012-10-25 DIAGNOSIS — Z8739 Personal history of other diseases of the musculoskeletal system and connective tissue: Secondary | ICD-10-CM | POA: Insufficient documentation

## 2012-10-25 DIAGNOSIS — Z87442 Personal history of urinary calculi: Secondary | ICD-10-CM | POA: Insufficient documentation

## 2012-10-25 DIAGNOSIS — Z79899 Other long term (current) drug therapy: Secondary | ICD-10-CM | POA: Insufficient documentation

## 2012-10-25 MED ORDER — ONDANSETRON 4 MG PO TBDP
4.0000 mg | ORAL_TABLET | Freq: Once | ORAL | Status: AC
  Administered 2012-10-25: 4 mg via ORAL
  Filled 2012-10-25: qty 1

## 2012-10-25 MED ORDER — OXYCODONE-ACETAMINOPHEN 5-325 MG PO TABS
1.0000 | ORAL_TABLET | Freq: Once | ORAL | Status: AC
  Administered 2012-10-25: 1 via ORAL
  Filled 2012-10-25: qty 1

## 2012-10-25 MED ORDER — LIDOCAINE HCL 2 % EX GEL
CUTANEOUS | Status: AC
  Filled 2012-10-25: qty 10

## 2012-10-25 MED ORDER — LIDOCAINE HCL 2 % EX GEL
Freq: Once | CUTANEOUS | Status: AC
  Administered 2012-10-25: 10 via URETHRAL

## 2012-10-25 NOTE — ED Notes (Signed)
Pt had kidney stone removed from L kidney and sent home about 3pm. Pt states that he has been drinking fluids and has only drained a little urine. Pain has increased.

## 2012-10-25 NOTE — ED Provider Notes (Signed)
Medical screening examination/treatment/procedure(s) were performed by non-physician practitioner and as supervising physician I was immediately available for consultation/collaboration.  Dessiree Sze, MD 10/25/12 0650 

## 2012-10-25 NOTE — ED Provider Notes (Signed)
CSN: [id number]     Arrival date & time 10/25/12  0220 History   First MD Initiated Contact with Patient 10/25/12 0224     Chief Complaint  Patient presents with  . Urine Output   (Consider location/radiation/quality/duration/timing/severity/associated sxs/prior Treatment) HPI 72 year old male with history of kidney stone, today he undergo ureteroscopic stone manipulation with basketing and laser-lithotripsy by Dr. Berneice Heinrich at [facility] earlier today.  He is here complaining of worsening penile discomfort after procedure.    Pt also notice a suture string attached to the foley catheter that was initially at the tip of the penis but however has migrated about 5-6 inches out and causing great discomfort.  Able to produce some urine, no c/o fever.  Did report mild nausea and vomiting after surgery but that has improved.  Pain is currently sharp, achy, worse with penile manipulation.  Pt also has bilateral stent placement as well.     Past Medical History  Diagnosis Date  . Brain tumor     age 78 - brain tumor removed left- benign - occas slight headaches since the surgery  . Fibromyalgia   . History of kidney stones     HX OF MULTIPLE KIDNEY STONES AND  CURRENTLY HAS BILATERAL RENAL STONES CAUSING ABDOMINAL AND BACK PAIN  . Headache(784.0)   . Arthritis     HANDS AND PT STATES HIS ARMS HURT  . Foley catheter in place    Past Surgical History  Procedure Laterality Date  . Brain tumor excision    . Lithotripsy    . Colon surgery  2001    COLON RESECTION FOR GROWTH - NOT CANCER  . Tonsillectomy    . Holmium laser application Right 10/05/2012    Procedure: HOLMIUM LASER APPLICATION;  Surgeon: Sebastian Ache, MD;  Location: [facility];  Service: Urology;  Laterality: Right;  . Cystoscopy/retrograde/ureteroscopy/stone extraction with basket Right 10/05/2012    Procedure: CYSTOSCOPY/RETROGRADE/URETEROSCOPY/STONE EXTRACTION WITH BASKET;  Surgeon: Sebastian Ache, MD;  Location: [facility];  Service:  Urology;  Laterality: Right;  . Cystoscopy with retrograde pyelogram, ureteroscopy and stent placement Left 10/05/2012    Procedure: CYSTOSCOPY WITH RETROGRADE PYELOGRAM, URETEROSCOPY AND STENT PLACEMENT;  Surgeon: Sebastian Ache, MD;  Location: [facility];  Service: Urology;  Laterality: Left;   History reviewed. No pertinent family history. History  Substance Use Topics  . Smoking status: Never Smoker   . Smokeless tobacco: Never Used  . Alcohol Use: Yes     Comment: OCCAS DRINK - BUT NOT DAILY    Review of Systems  Constitutional: Negative for fever.  Gastrointestinal: Positive for abdominal pain.  Genitourinary: Positive for penile pain.  Skin: Negative for rash.    Allergies  Other; Sulfa antibiotics; Vicodin; and Erythromycin  Home Medications   Current Outpatient Rx  Name  Route  Sig  Dispense  Refill  . ciprofloxacin (CIPRO) 250 MG tablet   Oral   Take 1 tablet (250 mg total) by mouth 2 (two) times daily. X 3 days. Begin day prior to next Urology appointment.   6 tablet   0   . finasteride (PROSCAR) 5 MG tablet   Oral   Take 1 tablet (5 mg total) by mouth daily. For large prostate.   90 tablet   3   . oxybutynin (DITROPAN) 5 MG tablet   Oral   Take 1 tablet (5 mg total) by mouth every 8 (eight) hours as needed. For bladder spasms / stent discomfort   30 tablet  2   . oxyCODONE-acetaminophen (PERCOCET/ROXICET) 5-325 MG per tablet   Oral   Take 1 tablet by mouth every 4 (four) hours as needed for pain.   40 tablet   0   . senna-docusate (SENOKOT-S) 8.6-50 MG per tablet   Oral   Take 1 tablet by mouth 2 (two) times daily. While taking pain meds to prevent constipation   30 tablet   1   . tamsulosin (FLOMAX) 0.4 MG CAPS capsule   Oral   Take 1 capsule (0.4 mg total) by mouth daily. For urination.   90 capsule   3    BP 149/86  Pulse 84  Temp(Src) 97.8 F (36.6 C) (Oral)  Resp 20  SpO2 95% Physical Exam  Nursing note and vitals  reviewed. Constitutional:  Chronic ill appearing male appears to be in mild distress  HENT:  Head: Atraumatic.  Eyes: Conjunctivae are normal.  Neck: Neck supple.  Abdominal: Soft. There is tenderness (Suprapubic tenderness without guarding or rebound tenderness.).  Genitourinary:  Penis tip with foley catheter in place, and suture string protrude from tip, attached to catheter.  No bleeding, no rash, no abscess.  Penis tender to palpation. No scrotal pain or swelling.  No hernia noted  Skin: No rash noted.  Psychiatric: He has a normal mood and affect.    ED Course  Procedures (including critical care time)  Pt has 210 cc of urine in urinary bladder on bladder scan.  Foley in place.  Has renal stent with suture string attached protruded from penile meatus.    I have consulted with oncall urologist, Dr. McDiarmid, who recommend remove foley, remove stent and have pt f/u with Dr. Berneice Heinrich tomorrow for further care.    3:35 AM I personally removed foley catheter as well as both renal stents by anesthetizing penile meatus with lidocaine jellly and apply steady pressure as i pulled both stents out without complication.  Pt tolerates well.  Will continue to monitor pt and will discharge once pt able to urinate.  Care discussed with attending.    4:57 AM Pt able to urinate, felt much better.  Will discharge. Pt will f/u with urologist in the morning.    Labs Review Labs Reviewed - No data to display Imaging Review No results found.  MDM   1. Penile pain    BP 149/86  Pulse 84  Temp(Src) 97.8 F (36.6 C) (Oral)  Resp 20  SpO2 95%     Fayrene Helper, PA-C 10/25/12 [phone]

## 2012-10-25 NOTE — Op Note (Signed)
[name], HANOVER NO.:  [id number]  MEDICAL RECORD NO.:  [id number]  LOCATION:  [facility]                         FACILITY:  [facility]  PHYSICIAN:  Sebastian Ache, MD     DATE OF BIRTH:  March 14, 1940  DATE OF PROCEDURE:  10/24/2012 DATE OF DISCHARGE:  10/24/2012                              OPERATIVE REPORT   DIAGNOSIS:  Bilateral residual renal stone.  PROCEDURE: 1. Cystoscopy with bilateral retrograde pyelograms and interpretation. 2. Exchange of bilateral ureteral stents, 5 x 26 Polaris with tether     to Foley catheter. 3. Exchange of Foley catheter. 4. Right ureteroscopy with basketing of stones. 5. Left ureteroscopy with laser lithotripsy.  FINDINGS: 1. Small volume residual right renal stone mostly papillary tip in the     mid pole calices, all fragments greater than 1/3 mm were removed. 2. Moderate volume, left-sided stones mostly in the lower pole.  There     were papillary tip calcifications in the upper and mid pole areas. 3. Unremarkable urinary bladder. 4. Unremarkable bilateral retrograde pyelograms.  INDICATIONS:  [name] is a very vigorous 72 year old gentleman with history of prior nephrolithiasis, who underwent first stage ureteroscopic stone ablation on October 05, 2012, for very large volume right greater than left nephrolithiasis.  At that time, majority of his right-sided stone was addressed.  He now presents for second-stage procedure to address any residual right-sided stone and his left-sided stone as well, and further developed urinary retention in the interval which has been managed with Foley catheter.  Most recent urinary culture has been negative.  Informed consent was obtained, and placed in the medical record.  PROCEDURE IN DETAIL:  The patient being [name] was verified. Procedure being bilateral ureteroscopic stone manipulation was confirmed.  The procedure was carried out.  Time-out was performed. Intravenous  antibiotics were administered.  General LMA anesthesia was introduced.  The patient placed into a low lithotomy position.  Sterile field was created by prepping and draping the patient's penis, perineum, and proximal thighs using iodine x3.  Next, cystourethroscopy was performed using a 22-French rigid cystoscope with a 12-degree lens. Inspection of the anterior and posterior urethra was unremarkable. Inspection of the bladder revealed distal end of bilateral ureteral stents in situ.  No papillary lesions, calcifications.  The distal end of the right stent was grasped, brought to the level of the urethral meatus.  A 0.038 Glidewire was advanced slowly to the upper pole.  The stent was exchanged for a 6-French end-hole catheter and right retrograde pyelogram seen.  Right retrograde pyelogram demonstrated a single right ureter, single system, right kidney.  No obvious filling defects or narrowing were noted.  The 0.038 Glidewire was once again advanced set aside as a safety wire.  Next, semi-rigid ureteroscopy was performed at the entire length of the right ureter alongside a separate Sensor working wire.  No mucosal abnormalities or calcifications were encountered.  The semi- rigid ureteroscope was then exchanged for the 12/14 38-cm ureteral access sheath using continuous fluoroscopic guidance to the level of proximal ureter.  Next, flexible ureteroscopy was performed using 8- [place] digital ureteroscope allowing pan inspection of all calices on the right side.  There  was small volume residual stone, total volume approximately 6 mm.  These were mostly papillary tip calcifications that were in the upper and mid pole.  These were sequentially grasped brought out in their entirety, set aside for compositional analysis.  Repeat inspection following these maneuvers revealed complete resolution of all stones greater than 1/3 mm.  The sheath was removed under continuous endoscopic vision.  No  evidence of mucosal abnormalities were found. Finally, a new 5 x 26 Polaris stent was placed on the right side using fluoroscopic guidance, good proximal and distal deployment were noted. Drain was left in situ.  Attention was directed to the left side.  Using the cystoscope, the left distal stent was brought to the level of the external meatus through which a 0.038 Glidewire was advanced to the level of the upper pole.  The stent was exchanged for a 6-French end- hole catheter and left retrograde pyelogram was seen.  Left retrograde pyelogram demonstrates a single left ureter, single system, left kidney.  No filling defects or narrowing noted.  The 0.038 glide was once again advanced set aside as a safety wire.  Next, semi- rigid ureteroscopy was performed of the entire length of left ureter. No mucosal abnormalities or calcifications were found.  This was performed alongside a separate 0.038 Sensor wire.  The semi-rigid ureteroscope was then exchanged for the 38-cm 12/14 ureteral access sheath using continuous fluoroscopic guidance to the level of the proximal ureter.  Next, systematic inspection of all calices were performed on the left side using 8-French digital ureteroscope. Multifocal stones were encountered.  Total stone volume approximately 1 cm.  There are papillary tip calcifications in the upper and mid pole. There is conglomerate stones in the lower pole.  These lower pole stones appeared to be much too large for simple basketing.  As such, a 200 nanometer laser fiber was used, and holmium laser energy was applied to the stone using settings of 0.2 joules and 5 hertz fragmenting the stone approximately 4 smaller pieces.  These were then grasped with escape basket and brought out in their entirety, set aside for compositional analysis.  This accomplished that calcifications were removed following these maneuvers, all stone fragments larger than 1/3 mm had been removed from  the left kidney.  There were no mucosal abnormalities.  Evidence of perforation.  The access sheath was then removed under continuous ureteroscopic vision and no mucosal abnormalities were found.  Finally, a new 5 x 26 Polaris stent was placed on the left side using fluoroscopic guidance.  Good proximal and distal deployment were seen. Final cystoscopic examination revealed extensive good position distally no evidence of bladder perforation.  No evidence of residual fragments. There was excellent hemostasis.  Next, the 0.038 Glidewire was advanced at the level of the urinary bladder through the rigid cystoscope and then the cystoscope was exchanged for 18-French [place] tip catheter which was placed at the level of urinary bladder.  A 10 mL of sterile water in the balloon.  The distal tethers of each stent were then fashioned to this so that it could all be removed together at next office visit.  Catheter was connected to straight drain.  Procedure was then terminated.  The patient tolerated the procedure well.  There were no immediate periprocedural complications.  The patient was taken to postanesthesia care unit in stable condition stable condition.          ______________________________ Sebastian Ache, MD     TM/MEDQ  D:  10/24/2012  T:  10/25/2012  Job:  [id number]

## 2012-10-26 ENCOUNTER — Encounter (HOSPITAL_COMMUNITY): Payer: Self-pay | Admitting: Urology

## 2012-11-08 DIAGNOSIS — R339 Retention of urine, unspecified: Secondary | ICD-10-CM | POA: Diagnosis not present

## 2012-11-08 DIAGNOSIS — N400 Enlarged prostate without lower urinary tract symptoms: Secondary | ICD-10-CM | POA: Diagnosis not present

## 2012-11-08 DIAGNOSIS — N200 Calculus of kidney: Secondary | ICD-10-CM | POA: Diagnosis not present

## 2012-12-06 DIAGNOSIS — R339 Retention of urine, unspecified: Secondary | ICD-10-CM | POA: Diagnosis not present

## 2012-12-06 DIAGNOSIS — N200 Calculus of kidney: Secondary | ICD-10-CM | POA: Diagnosis not present

## 2012-12-06 DIAGNOSIS — N400 Enlarged prostate without lower urinary tract symptoms: Secondary | ICD-10-CM | POA: Diagnosis not present

## 2013-12-09 DIAGNOSIS — R339 Retention of urine, unspecified: Secondary | ICD-10-CM | POA: Diagnosis not present

## 2013-12-09 DIAGNOSIS — N200 Calculus of kidney: Secondary | ICD-10-CM | POA: Diagnosis not present

## 2013-12-09 DIAGNOSIS — N400 Enlarged prostate without lower urinary tract symptoms: Secondary | ICD-10-CM | POA: Diagnosis not present

## 2014-12-15 DIAGNOSIS — N401 Enlarged prostate with lower urinary tract symptoms: Secondary | ICD-10-CM | POA: Diagnosis not present

## 2014-12-15 DIAGNOSIS — R338 Other retention of urine: Secondary | ICD-10-CM | POA: Diagnosis not present

## 2014-12-15 DIAGNOSIS — N200 Calculus of kidney: Secondary | ICD-10-CM | POA: Diagnosis not present

## 2014-12-15 DIAGNOSIS — I10 Essential (primary) hypertension: Secondary | ICD-10-CM | POA: Diagnosis not present

## 2014-12-17 ENCOUNTER — Ambulatory Visit: Payer: Medicare Other | Admitting: Adult Health

## 2015-02-03 ENCOUNTER — Encounter: Payer: Self-pay | Admitting: Adult Health

## 2015-02-03 ENCOUNTER — Ambulatory Visit (INDEPENDENT_AMBULATORY_CARE_PROVIDER_SITE_OTHER): Payer: Medicare Other | Admitting: Adult Health

## 2015-02-03 VITALS — BP 160/90 | Temp 98.2°F | Ht 71.0 in | Wt 173.1 lb

## 2015-02-03 DIAGNOSIS — I10 Essential (primary) hypertension: Secondary | ICD-10-CM | POA: Diagnosis not present

## 2015-02-03 DIAGNOSIS — M797 Fibromyalgia: Secondary | ICD-10-CM

## 2015-02-03 DIAGNOSIS — K429 Umbilical hernia without obstruction or gangrene: Secondary | ICD-10-CM

## 2015-02-03 DIAGNOSIS — Z23 Encounter for immunization: Secondary | ICD-10-CM | POA: Diagnosis not present

## 2015-02-03 DIAGNOSIS — Z7189 Other specified counseling: Secondary | ICD-10-CM | POA: Diagnosis not present

## 2015-02-03 DIAGNOSIS — I493 Ventricular premature depolarization: Secondary | ICD-10-CM

## 2015-02-03 DIAGNOSIS — Z7689 Persons encountering health services in other specified circumstances: Secondary | ICD-10-CM

## 2015-02-03 MED ORDER — LISINOPRIL 5 MG PO TABS: 5.0000 mg | ORAL_TABLET | Freq: Every day | ORAL | Status: DC

## 2015-02-03 MED ORDER — AMITRIPTYLINE HCL 25 MG PO TABS: 25.0000 mg | ORAL_TABLET | Freq: Every day | ORAL | Status: DC

## 2015-02-03 MED ORDER — FLUOXETINE HCL 20 MG PO TABS: 20.0000 mg | ORAL_TABLET | Freq: Every day | ORAL | Status: DC

## 2015-02-03 NOTE — Patient Instructions (Signed)
It was great meeting you today!  I have prescribed a few medications  1. Lisinopril - take this for high blood pressure. Start monitoring at home 2. Prozac and Elavil. Take the Elavil at night as it will make you sleepy. It may take a couple of weeks to feel the effects of it.   Follow up in February for a physical.   Someone from surgery will call you to evaluate the hernia  Someone will call you to schedule a colonoscopy.

## 2015-02-03 NOTE — Progress Notes (Signed)
HPI:  [name] is here to establish care. He is a very pleasant and extremely active 74 year old male. He  has a past medical history of Brain tumor ([place]); Fibromyalgia (1990's ); History of kidney stones; Headache(784.0); Arthritis; Hypertension; and Colonic mass (2001).  Last PCP and physical: Unknown  Has the following chronic problems that require follow up and concerns today:  Essnetial Hypertension - Has not taken any medications in the past for this. He was recently at another providers clinic where the BP was a reported 99991111 systolic. He is complaining of headache and bouts of dizziness. Denies any syncopal episodes.   Fibromyalgia - He has tried Lyrica or gabapentin? In the past but did not like the way they made him feel. He has " learned to deal with the pain."   Ectopic beats - He feels as though every once in awhile he "feels an extra beat". He denies any chest pain with rest or exertion. He denies any shortness of breath.   ROS negative for unless reported above: fevers, chills,feeling poorly, unintentional weight loss, hearing or vision loss, chest pain, palpitations, leg claudication, struggling to breath,Not feeling congested in the chest, no orthopenia, no cough,no wheezing, normal appetite, no soft tissue swelling, no hemoptysis, melena, hematochezia, hematuria, falls, loc, si, or thoughts of self harm.  Immunizations: Needs all shots.  Diet:Eats healthy Exercise:Plays tennis Colonoscopy: 2001   Past Medical History  Diagnosis Date  . Brain tumor [facility])     age 45 - brain tumor removed left- benign - occas slight headaches since the surgery  . Fibromyalgia   . History of kidney stones     HX OF MULTIPLE KIDNEY STONES AND  CURRENTLY HAS BILATERAL RENAL STONES CAUSING ABDOMINAL AND BACK PAIN  . Headache(784.0)   . Arthritis     HANDS AND PT STATES HIS ARMS HURT  . Foley catheter in place   . Hypertension     high blood pressure readings     Past  Surgical History  Procedure Laterality Date  . Brain tumor excision    . Lithotripsy    . Colon surgery  2001    COLON RESECTION FOR GROWTH - NOT CANCER  . Tonsillectomy    . Holmium laser application Right [id number]    Procedure: HOLMIUM LASER APPLICATION;  Surgeon: Alexis Frock, MD;  Location: [facility];  Service: Urology;  Laterality: Right;  . Cystoscopy/retrograde/ureteroscopy/stone extraction with basket Right 10/05/2012    Procedure: CYSTOSCOPY/RETROGRADE/URETEROSCOPY/STONE EXTRACTION WITH BASKET;  Surgeon: Alexis Frock, MD;  Location: [facility];  Service: Urology;  Laterality: Right;  . Cystoscopy with retrograde pyelogram, ureteroscopy and stent placement Left 10/05/2012    Procedure: [place], URETEROSCOPY AND STENT PLACEMENT;  Surgeon: Alexis Frock, MD;  [facility];  Service: Urology;  Laterality: Left;  . Holmium laser application Bilateral [id number]    Procedure: HOLMIUM LASER APPLICATION;  Surgeon: Alexis Frock, MD;  [facility];  Service: Urology;  Laterality: Bilateral;  . Cystoscopy/retrograde/ureteroscopy/stone extraction with basket Bilateral 10/24/2012    Procedure: CYSTOSCOPY/RETROGRADE/URETEROSCOPY/STONE EXTRACTION WITH BASKET;  Surgeon: Alexis Frock, MD;  [facility];  Service: Urology;  Laterality: Bilateral;  with bilateral stent placement    History reviewed. No pertinent family history.  Social History   Social History  . Marital Status: Single    Spouse Name: N/A  . Number of Children: N/A  . Years of Education: N/A   Social History Main Topics  . Smoking status: Never Smoker   .  Smokeless tobacco: Never Used  . Alcohol Use: 0.0 oz/week    0 Standard drinks or equivalent per week     Comment: OCCAS DRINK - BUT NOT DAILY  . Drug Use: No  . Sexual Activity: Not Asked   Other Topics Concern  . None   Social History Narrative    No current outpatient prescriptions on file.  EXAMDanley Danker Vitals:    02/03/15 1351  BP: 160/90  Temp: 98.2 F (36.8 C)    Body mass index is 24.15 kg/(m^2).  GENERAL: vitals reviewed and listed above, alert, oriented, appears well hydrated and in no acute distress  HEENT: atraumatic, conjunttiva clear, no obvious abnormalities on inspection of external nose and ears  NECK: Neck is soft and supple without masses, no adenopathy or thyromegaly, trachea midline, no JVD. Normal range of motion.   LUNGS: clear to auscultation bilaterally, no wheezes, rales or rhonchi, good air movement  CV: Bradycardic with frequent ectopic beat. normal S1/S2, no audible murmurs, gallops, or rubs. No carotid bruit and no peripheral edema.   MS: moves all extremities without noticeable abnormality. No edema noted  Abd: soft/nontender/nondistended/normal bowel sounds. Umbilical hernia noted.    Skin: warm and dry, no rash   Extremities: No clubbing, cyanosis, or edema. Capillary refill is WNL. Pulses intact bilaterally in upper and lower extremities.   Neuro: CN II-XII intact, sensation and reflexes normal throughout, 5/5 muscle strength in bilateral upper and lower extremities. Normal finger to nose. Normal rapid alternating movements. Normal romberg. No pronator drift.   PSYCH: pleasant and cooperative, no obvious depression or anxiety  ASSESSMENT AND PLAN:  1. Encounter to establish care - Follow up in February for MWE - Follow up sooner if needed - Ambulatory referral to Gastroenterology - for colonoscopy.   2. Umbilical hernia without obstruction and without gangrene - Ambulatory referral to General Surgery  3. Essential hypertension - Monitor blood pressure at home. Inform this writer if blood pressures continue to stay elevated or drop below [id number] systolic.  - lisinopril (PRINIVIL,ZESTRIL) 5 MG tablet; Take 1 tablet (5 mg total) by mouth daily.  Dispense: 30 tablet; Refill: 3 - EKG 12-Lead - Sinus  Bradycardia  - frequent ectopic ventricular beat s  # VECs =  2, Rate 56 - We spoke about seeing cardiology or wearing a holter monitor. He denies any symptoms and will hold off on seeing a cardiologist or wearing a holter monitor.   4. Fibromyalgia - FLUoxetine (PROZAC) 20 MG tablet; Take 1 tablet (20 mg total) by mouth daily.  Dispense: 30 tablet; Refill: 3 - amitriptyline (ELAVIL) 25 MG tablet; Take 1 tablet (25 mg total) by mouth at bedtime.  Dispense: 30 tablet; Refill: 3  5. Encounter for immunization - Flu shot given   6. Need for pneumococcal vaccination - Pneumococcal conjugate vaccine 13-valent IM  -We reviewed the PMH, PSH, FH, SH, Meds and Allergies. -We provided refills for any medications we will prescribe as needed. -We addressed current concerns per orders and patient instructions. -We have asked for records for pertinent exams, studies, vaccines and notes from previous providers. -We have advised patient to follow up per instructions below.   -Patient advised to return or notify a provider immediately if symptoms worsen or persist or new concerns arise.   Dorothyann Peng, AGNP

## 2015-02-04 ENCOUNTER — Encounter: Payer: Self-pay | Admitting: Internal Medicine

## 2015-02-19 ENCOUNTER — Other Ambulatory Visit: Payer: Self-pay | Admitting: General Surgery

## 2015-02-19 DIAGNOSIS — K432 Incisional hernia without obstruction or gangrene: Secondary | ICD-10-CM | POA: Diagnosis not present

## 2015-02-23 ENCOUNTER — Ambulatory Visit
Admission: RE | Admit: 2015-02-23 | Discharge: 2015-02-23 | Disposition: A | Discharge status: Home / Self Care | Payer: Medicare Other | Source: Ambulatory Visit | Attending: General Surgery | Admitting: General Surgery

## 2015-02-23 DIAGNOSIS — N200 Calculus of kidney: Secondary | ICD-10-CM | POA: Diagnosis not present

## 2015-02-23 DIAGNOSIS — K4090 Unilateral inguinal hernia, without obstruction or gangrene, not specified as recurrent: Secondary | ICD-10-CM | POA: Diagnosis not present

## 2015-02-23 DIAGNOSIS — K432 Incisional hernia without obstruction or gangrene: Secondary | ICD-10-CM

## 2015-03-10 ENCOUNTER — Encounter: Payer: Self-pay | Admitting: *Deleted

## 2015-03-17 ENCOUNTER — Ambulatory Visit (AMBULATORY_SURGERY_CENTER): Payer: Self-pay

## 2015-03-17 VITALS — Ht 72.0 in | Wt 172.6 lb

## 2015-03-17 DIAGNOSIS — Z86018 Personal history of other benign neoplasm: Secondary | ICD-10-CM

## 2015-03-17 DIAGNOSIS — Z800 Family history of malignant neoplasm of digestive organs: Secondary | ICD-10-CM

## 2015-03-17 DIAGNOSIS — Z8719 Personal history of other diseases of the digestive system: Secondary | ICD-10-CM

## 2015-03-17 MED ORDER — NA SULFATE-K SULFATE-MG SULF 17.5-3.13-1.6 GM/177ML PO SOLN: ORAL | Status: DC

## 2015-03-17 NOTE — Progress Notes (Signed)
Per pt, no allergies to soy or egg products.Pt not taking any weight loss meds or using  O2 at home.  Pt came into th office today for his pre-visit prior to his colonoscopy with Dr Hilarie Fredrickson on 03/31/15.Pt states he is seeing a cardiologist at [facility] cardiology on 03/20/15 to get medical clearance for his future hernia surgery.(no surgery scheduled yet).The patient states he has an irregular heartbeat.No SOB, or chest pain present.Pt was advised to call our office regarding the results from his cardiology appt.He understood

## 2015-03-20 ENCOUNTER — Encounter: Payer: Self-pay | Admitting: Cardiology

## 2015-03-20 ENCOUNTER — Ambulatory Visit (INDEPENDENT_AMBULATORY_CARE_PROVIDER_SITE_OTHER): Payer: Medicare Other | Admitting: Cardiology

## 2015-03-20 VITALS — BP 146/82 | HR 73 | Ht 72.0 in | Wt 172.0 lb

## 2015-03-20 DIAGNOSIS — R5382 Chronic fatigue, unspecified: Secondary | ICD-10-CM | POA: Diagnosis not present

## 2015-03-20 DIAGNOSIS — R9431 Abnormal electrocardiogram [ECG] [EKG]: Secondary | ICD-10-CM

## 2015-03-20 DIAGNOSIS — I4581 Long QT syndrome: Secondary | ICD-10-CM

## 2015-03-20 DIAGNOSIS — R079 Chest pain, unspecified: Secondary | ICD-10-CM | POA: Diagnosis not present

## 2015-03-20 DIAGNOSIS — I493 Ventricular premature depolarization: Secondary | ICD-10-CM

## 2015-03-20 DIAGNOSIS — Z01810 Encounter for preprocedural cardiovascular examination: Secondary | ICD-10-CM

## 2015-03-20 NOTE — Progress Notes (Signed)
Patient ID: RUFAS SCRONCE, male   DOB: [birth year], 76 y.o.   MRN: [id number]      Cardiology Office Note   Date:  03/20/2015   ID:  [name], DOB 1940/08/04, MRN [id number]  PCP:  Dorothyann Peng, NP  Cardiologist:  Dorothy Spark, MD  Referring physician: Dr Greer Pickerel from [facility] surgery  Chief complain: Fatigue, palpitations, DOE   History of Present Illness: [name] is a 75 y.o. male who presents for preoperative evaluation for hernia repair. 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 He has noticed lately that he has profound fatigue and some dyspnea on exertion. He is also noticed significant skipped beats especially at night. His concern is that he has anxiety attacks associated with palpitations with presyncopal episodes but no syncope. He denies any chest pain, no claudications no orthopnea or proximal nocturnal dyspnea. His father had heart attack and bypass surgery at age of 80 and passed away at age of 17. The patient has never smoked.  Past Medical History  Diagnosis Date  . Brain tumor [facility])     age 71 - brain tumor removed left- benign - occas slight headaches since the surgery  . Fibromyalgia 1990's   . History of kidney stones     HX OF MULTIPLE KIDNEY STONES AND  CURRENTLY HAS BILATERAL RENAL STONES CAUSING ABDOMINAL AND BACK PAIN  . Headache(784.0)   . Arthritis     HANDS AND PT STATES HIS ARMS HURT  . Hypertension     high blood pressure readings   . Colonic mass 2001  . Irregular heart beat   . H/O inguinal hernia     right side  . Hernia, umbilical     Past Surgical History  Procedure Laterality Date  . Brain tumor excision    . Lithotripsy    . Colon surgery  2001    COLON RESECTION FOR GROWTH - NOT CANCER  . Tonsillectomy    . Holmium laser application Right [id number]    Procedure: HOLMIUM LASER APPLICATION;   Surgeon: Alexis Frock, MD;  [facility];  Service: Urology;  Laterality: Right;  . Cystoscopy/retrograde/ureteroscopy/stone extraction with basket Right 10/05/2012    Procedure: CYSTOSCOPY/RETROGRADE/URETEROSCOPY/STONE EXTRACTION WITH BASKET;  Surgeon: Alexis Frock, MD;  [facility];  Service: Urology;  Laterality: Right;  . Cystoscopy with retrograde pyelogram, ureteroscopy and stent placement Left 10/05/2012    Procedure: [place], URETEROSCOPY AND STENT PLACEMENT;  Surgeon: Alexis Frock, MD;  [facility];  Service: Urology;  Laterality: Left;  . Holmium laser application Bilateral [id number]    Procedure: HOLMIUM LASER APPLICATION;  Surgeon: Alexis Frock, MD;  [facility];  Service: Urology;  Laterality: Bilateral;  . Cystoscopy/retrograde/ureteroscopy/stone extraction with basket Bilateral 10/24/2012    Procedure: CYSTOSCOPY/RETROGRADE/URETEROSCOPY/STONE EXTRACTION WITH BASKET;  Surgeon: Alexis Frock, MD;  [facility];  Service: Urology;  Laterality: Bilateral;  with bilateral stent placement     Current Outpatient Prescriptions  Medication Sig Dispense Refill  . amitriptyline (ELAVIL) 25 MG tablet Take 1 tablet (25 mg total) by mouth at bedtime. 30 tablet 3  . BEE POLLEN PO Take by mouth daily.    Marland Kitchen Bioflavonoid Products (BIOFLEX PO) Take by mouth daily.    . Cranberry 1000 MG CAPS Take by mouth daily.    Marland Kitchen FLUoxetine (PROZAC) 20 MG tablet Take  1 tablet (20 mg total) by mouth daily. 30 tablet 3  . ibuprofen (ADVIL,MOTRIN) 200 MG tablet Take 200 mg by mouth. Take 3 pills in am and 3 pills at bedtime    . lisinopril (PRINIVIL,ZESTRIL) 5 MG tablet Take 1 tablet (5 mg total) by mouth daily. 30 tablet 3  . Multiple Vitamin (MULTIVITAMIN) tablet Take 1 tablet by mouth daily.    . Na Sulfate-K Sulfate-Mg Sulf (SUPREP BOWEL PREP) SOLN Suprep as directed / no substitutions 354 mL 0   No current facility-administered medications for this  visit.    Allergies:   Other; Sulfa antibiotics; Vicodin; and Erythromycin    Social History:  The patient  reports that he has never smoked. He has never used smokeless tobacco. He reports that he drinks about 4.2 oz of alcohol per week. He reports that he does not use illicit drugs.   Family History:  The patient's family history includes Colon cancer in his father; Heart disease in his father.    ROS:  Please see the history of present illness.   Otherwise, review of systems are positive for none.   All other systems are reviewed and negative.    PHYSICAL EXAM: VS:  BP 146/82 mmHg  Pulse 73  Ht 6' (1.829 m)  Wt 172 lb (78.019 kg)  BMI 23.32 kg/m2 , BMI Body mass index is 23.32 kg/(m^2). GEN: Well nourished, well developed, in no acute distress HEENT: normal Neck: no JVD, carotid bruits, or masses Cardiac: RRR; no murmurs, rubs, or gallops,no edema  Respiratory:  clear to auscultation bilaterally, normal work of breathing GI: soft, nontender, nondistended, + BS MS: no deformity or atrophy Skin: warm and dry, no rash Neuro:  Strength and sensation are intact Psych: euthymic mood, full affect   EKG:  EKG is ordered today. The ekg ordered today demonstrates sinus rhythm with frequent PVCs, nonspecific ST and T-wave abnormalities. There is prolonged QT interval. QTC 488, QTC 522 ms.  Recent Labs: No results found for requested labs within last 365 days.   Lipid Panel No results found for: CHOL, TRIG, HDL, CHOLHDL, VLDL, LDLCALC, LDLDIRECT   Wt Readings from Last 3 Encounters:  03/20/15 172 lb (78.019 kg)  03/17/15 172 lb 9.6 oz (78.291 kg)  02/03/15 173 lb 1.6 oz (78.518 kg)      ASSESSMENT AND PLAN:  1. Fatigue, dyspnea on exertion - we will schedule stress test to rule out ischemia. We'll also order an echocardiogram to evaluate for systolic and diastolic function.  2. Very frequent monomorphic PVCs - first to evaluate LVEF with echocardiogram, also have to rule  out ischemia. We will order a 48-hour Holter monitor to evaluate for absolute number in 24-hour period.  3. Palpitations, anxiety - there is a concern that these in fact might be ventricular tachycardia considering his frequent PVCs, we will order a 48-hour Holter monitor to further evaluate.  4. Prolonged QT - possibly secondary to amitriptyline we will recheck at the next visit and potentially change if still prolonged.  5. Preoperative evaluation for hernia repair - we will, and on that once we have results from the tests stated above.  Follow up in 2 months.   Signed, Dorothy Spark, MD  03/20/2015 10:50 AM    [address], [place],   [address] Phone: [phone]; Fax: [phone]

## 2015-03-20 NOTE — Patient Instructions (Signed)
Medication Instructions:  Your physician recommends that you continue on your current medications as directed. Please refer to the Current Medication list given to you today.   Labwork: -None  Testing/Procedures: Your physician has requested that you have an echocardiogram. Echocardiography is a painless test that uses sound waves to create images of your heart. It provides your doctor with information about the size and shape of your heart and how well your heart's chambers and valves are working. This procedure takes approximately one hour. There are no restrictions for this procedure.  Your physician discussed the importance of regular exercise and recommended that you start or continue a regular exercise program for good health.    Follow-Up: Your physician recommends that you keep your scheduled  follow-up appointment with Dr. Meda Coffee   Any Other Special Instructions Will Be Listed Below (If Applicable).     If you need a refill on your cardiac medications before your next appointment, please call your pharmacy.

## 2015-03-30 ENCOUNTER — Telehealth (HOSPITAL_COMMUNITY): Payer: Self-pay

## 2015-03-30 NOTE — Telephone Encounter (Signed)
Encounter complete. 

## 2015-03-31 ENCOUNTER — Encounter: Payer: Medicare Other | Admitting: Internal Medicine

## 2015-04-01 ENCOUNTER — Ambulatory Visit (HOSPITAL_COMMUNITY): Payer: Medicare Other | Attending: Cardiology

## 2015-04-01 DIAGNOSIS — R079 Chest pain, unspecified: Secondary | ICD-10-CM | POA: Insufficient documentation

## 2015-04-01 DIAGNOSIS — R9439 Abnormal result of other cardiovascular function study: Secondary | ICD-10-CM | POA: Insufficient documentation

## 2015-04-01 DIAGNOSIS — R5382 Chronic fatigue, unspecified: Secondary | ICD-10-CM

## 2015-04-01 DIAGNOSIS — I493 Ventricular premature depolarization: Secondary | ICD-10-CM

## 2015-04-01 DIAGNOSIS — R002 Palpitations: Secondary | ICD-10-CM | POA: Diagnosis not present

## 2015-04-01 DIAGNOSIS — R0609 Other forms of dyspnea: Secondary | ICD-10-CM | POA: Diagnosis not present

## 2015-04-01 DIAGNOSIS — I10 Essential (primary) hypertension: Secondary | ICD-10-CM | POA: Insufficient documentation

## 2015-04-01 DIAGNOSIS — R5383 Other fatigue: Secondary | ICD-10-CM | POA: Insufficient documentation

## 2015-04-01 LAB — MYOCARDIAL PERFUSION IMAGING
LV dias vol: 130 mL
LV sys vol: 70 mL
Peak HR: 113 {beats}/min
RATE: 0.28
Rest HR: 50 {beats}/min
SDS: 3
SRS: 6
SSS: 9
TID: 1

## 2015-04-01 MED ORDER — AMINOPHYLLINE 25 MG/ML IV SOLN
75.0000 mg | Freq: Once | INTRAVENOUS | Status: AC
Start: 2015-04-01 — End: 2015-04-01
  Administered 2015-04-01: 75 mg via INTRAVENOUS

## 2015-04-01 MED ORDER — TECHNETIUM TC 99M SESTAMIBI GENERIC - CARDIOLITE
11.0000 | Freq: Once | INTRAVENOUS | Status: AC | PRN
  Administered 2015-04-01: 11 via INTRAVENOUS

## 2015-04-01 MED ORDER — REGADENOSON 0.4 MG/5ML IV SOLN
0.4000 mg | Freq: Once | INTRAVENOUS | Status: AC
  Administered 2015-04-01: 0.4 mg via INTRAVENOUS

## 2015-04-01 MED ORDER — TECHNETIUM TC 99M SESTAMIBI GENERIC - CARDIOLITE
32.6000 | Freq: Once | INTRAVENOUS | Status: AC | PRN
  Administered 2015-04-01: 33 via INTRAVENOUS

## 2015-04-02 ENCOUNTER — Ambulatory Visit (INDEPENDENT_AMBULATORY_CARE_PROVIDER_SITE_OTHER): Payer: Medicare Other

## 2015-04-02 ENCOUNTER — Encounter: Payer: Self-pay | Admitting: *Deleted

## 2015-04-02 ENCOUNTER — Telehealth: Payer: Self-pay | Admitting: *Deleted

## 2015-04-02 ENCOUNTER — Other Ambulatory Visit: Payer: Self-pay

## 2015-04-02 ENCOUNTER — Ambulatory Visit (HOSPITAL_COMMUNITY): Payer: Medicare Other | Attending: Cardiovascular Disease

## 2015-04-02 DIAGNOSIS — I493 Ventricular premature depolarization: Secondary | ICD-10-CM

## 2015-04-02 DIAGNOSIS — R079 Chest pain, unspecified: Secondary | ICD-10-CM

## 2015-04-02 DIAGNOSIS — R5382 Chronic fatigue, unspecified: Secondary | ICD-10-CM

## 2015-04-02 DIAGNOSIS — I519 Heart disease, unspecified: Secondary | ICD-10-CM

## 2015-04-02 DIAGNOSIS — I119 Hypertensive heart disease without heart failure: Secondary | ICD-10-CM | POA: Diagnosis not present

## 2015-04-02 DIAGNOSIS — I340 Nonrheumatic mitral (valve) insufficiency: Secondary | ICD-10-CM | POA: Insufficient documentation

## 2015-04-02 DIAGNOSIS — I351 Nonrheumatic aortic (valve) insufficiency: Secondary | ICD-10-CM | POA: Insufficient documentation

## 2015-04-02 DIAGNOSIS — R9439 Abnormal result of other cardiovascular function study: Secondary | ICD-10-CM

## 2015-04-02 DIAGNOSIS — Z01812 Encounter for preprocedural laboratory examination: Secondary | ICD-10-CM

## 2015-04-02 DIAGNOSIS — I10 Essential (primary) hypertension: Secondary | ICD-10-CM

## 2015-04-02 MED ORDER — CARVEDILOL 3.125 MG PO TABS: 3.1250 mg | ORAL_TABLET | Freq: Two times a day (BID) | ORAL | Status: DC

## 2015-04-02 MED ORDER — ASPIRIN EC 81 MG PO TBEC: 81.0000 mg | DELAYED_RELEASE_TABLET | Freq: Every day | ORAL | Status: DC

## 2015-04-02 NOTE — Telephone Encounter (Signed)
Notified the pt that per Dr Meda Coffee, his stress test is borderline, and it would be ok for the surgical clearance, however it did show that his LVEF is mildly decreased, and she would like to order a echo to verify this.  Per the pt, he states he just left our office today and did the echo and had his 48 hour holter monitor placed on.  Informed the pt that I will route this message to Dr Meda Coffee to make her aware that he had his echo today.  Pt verbalized understanding and agrees with this plan.

## 2015-04-02 NOTE — Telephone Encounter (Addendum)
-----   Message from Dorothy Spark, MD sent at 04/02/2015  4:51 PM EST ----- Karlene Einstein, he will need a left cath (new LV dysfunction, abnormal stress test), very frequent PVCs. Please start carvedilol 3.125 mg po BID. Thank you, KN  Notes Recorded by Dorothy Spark, MD on 04/02/2015 at 4:51 PM For the cath he will need labs - CMP, lipids, CBC, INR.

## 2015-04-02 NOTE — Telephone Encounter (Signed)
Called the cath lab and scheduled the pts cath for next Thursday 04/09/15 at [facility] at 1030 with Dr Angelena Form. Scheduled the pts pre-cath labs for next Monday 04/06/15 at our office to check cmet, lipids, cbc, pt/inr.  Pt aware to come fasting.  Informed the pt of cath date for 04/09/15 at [facility] with Dr Angelena Form at 1030, and he must arrive at 0830.  Went over cath instructions with the pt over the phone.  Informed the pt that when he comes in for his lab appt, there will be a cath instruction letter available for him to pick up.  Advised him to ask the receptionist to give him his cath instruction letter.  Pt verbalized understanding and agrees with this plan.  Will route this message to Dr Meda Coffee to place cardiac cath hospital orders in.

## 2015-04-02 NOTE — Addendum Note (Signed)
Addended by: Dorothy Spark on: 04/02/2015 07:01 PM   Modules accepted: Orders

## 2015-04-02 NOTE — Telephone Encounter (Signed)
Spoke with the pt to inform him that per Dr Meda Coffee his stress test was abnormal and he has new LV dysfunction, and very frequent PVCs, and she recommends that we schedule him to have a left cardiac cath done.  Informed the pt that this will help in getting him cleared for surgery Informed the pt that per Dr Meda Coffee that with him having frequent PVCs, abnormal stress test, and new LV dysfunction, she recommends that we start him on Asprin 81 mg po daily and carvedilol 3.125 mg po bid.  Confirmed the pharmacy of choice with the pt.  Provided pt education on what a cardiac cath cath will look for.  Informed the pt that he will need to come in for pre-procedure labs to check a CBC W DIFF, CMET, lipids, PT/INR, and lipids prior to his cath.  Informed the pt that he will need to come fasting to this lab appt.  Pt prefers his cath to be scheduled for next Thursday 04/09/15 and his lab appt scheduled for next Monday 04/06/15.

## 2015-04-02 NOTE — Telephone Encounter (Signed)
-----   Message from Dorothy Spark, MD sent at 04/02/2015  8:15 AM EST ----- Karlene Einstein, his stress test is borderline, it would be ok for the surgical clearance, however it shows that LVEF is mildly decreased, could you order echo to verify? Thank you, K

## 2015-04-06 ENCOUNTER — Other Ambulatory Visit (INDEPENDENT_AMBULATORY_CARE_PROVIDER_SITE_OTHER): Payer: Medicare Other | Admitting: *Deleted

## 2015-04-06 DIAGNOSIS — Z01812 Encounter for preprocedural laboratory examination: Secondary | ICD-10-CM

## 2015-04-06 DIAGNOSIS — R9439 Abnormal result of other cardiovascular function study: Secondary | ICD-10-CM

## 2015-04-06 DIAGNOSIS — I10 Essential (primary) hypertension: Secondary | ICD-10-CM | POA: Diagnosis not present

## 2015-04-06 DIAGNOSIS — I493 Ventricular premature depolarization: Secondary | ICD-10-CM

## 2015-04-06 DIAGNOSIS — I519 Heart disease, unspecified: Secondary | ICD-10-CM

## 2015-04-06 LAB — COMPREHENSIVE METABOLIC PANEL
ALT: 19 U/L (ref 9–46)
AST: 29 U/L (ref 10–35)
Albumin: 3.9 g/dL (ref 3.6–5.1)
Alkaline Phosphatase: 74 U/L (ref 40–115)
BUN: 20 mg/dL (ref 7–25)
CO2: 27 mmol/L (ref 20–31)
Calcium: 8.8 mg/dL (ref 8.6–10.3)
Chloride: 102 mmol/L (ref 98–110)
Creat: 1 mg/dL (ref 0.70–1.18)
Glucose, Bld: 93 mg/dL (ref 65–99)
Potassium: 4.2 mmol/L (ref 3.5–5.3)
Sodium: 138 mmol/L (ref 135–146)
Total Bilirubin: 0.4 mg/dL (ref 0.2–1.2)
Total Protein: 6.3 g/dL (ref 6.1–8.1)

## 2015-04-06 LAB — LIPID PANEL
Cholesterol: 185 mg/dL (ref 125–200)
HDL: 43 mg/dL (ref >=40)
LDL Cholesterol: 120 mg/dL (ref <130)
Total CHOL/HDL Ratio: 4.3 Ratio (ref <=5.0)
Triglycerides: 111 mg/dL (ref <150)
VLDL: 22 mg/dL (ref <30)

## 2015-04-06 LAB — CBC
HCT: 42.7 % (ref 39.0–52.0)
Hemoglobin: 14.3 g/dL (ref 13.0–17.0)
MCH: 32.6 pg (ref 26.0–34.0)
MCHC: 33.5 g/dL (ref 30.0–36.0)
MCV: 97.3 fL (ref 78.0–100.0)
MPV: 9.9 fL (ref 8.6–12.4)
Platelets: 301 10*3/uL (ref 150–400)
RBC: 4.39 MIL/uL (ref 4.22–5.81)
RDW: 13.5 % (ref 11.5–15.5)
WBC: 5.4 10*3/uL (ref 4.0–10.5)

## 2015-04-06 LAB — PROTIME-INR
INR: 0.98 (ref <1.50)
Prothrombin Time: 13.1 seconds (ref 11.6–15.2)

## 2015-04-07 ENCOUNTER — Encounter: Payer: Self-pay | Admitting: Adult Health

## 2015-04-07 ENCOUNTER — Telehealth: Payer: Self-pay | Admitting: *Deleted

## 2015-04-07 ENCOUNTER — Ambulatory Visit (INDEPENDENT_AMBULATORY_CARE_PROVIDER_SITE_OTHER): Payer: Medicare Other | Admitting: Adult Health

## 2015-04-07 VITALS — BP 122/60 | Temp 97.8°F | Ht 72.0 in | Wt 173.0 lb

## 2015-04-07 DIAGNOSIS — I10 Essential (primary) hypertension: Secondary | ICD-10-CM | POA: Diagnosis not present

## 2015-04-07 DIAGNOSIS — E785 Hyperlipidemia, unspecified: Secondary | ICD-10-CM

## 2015-04-07 DIAGNOSIS — Z0000 Encounter for general adult medical examination without abnormal findings: Secondary | ICD-10-CM | POA: Diagnosis not present

## 2015-04-07 MED ORDER — ATORVASTATIN CALCIUM 10 MG PO TABS: 10.0000 mg | ORAL_TABLET | Freq: Every day | ORAL | Status: DC

## 2015-04-07 NOTE — Telephone Encounter (Signed)
-----   Message from Dorothy Spark, MD sent at 04/07/2015  8:24 AM EST ----- His LDL is elevated, I would start atorvastatin 10 mg po daily and recheck CMP in 1 month.

## 2015-04-07 NOTE — Progress Notes (Signed)
Subjective:  Patient presents today for their annual wellness visit.  He is a pleasant caucasian male who  has a past medical history of Brain tumor ([place]); Fibromyalgia (1990's ); History of kidney stones; Headache(784.0); Arthritis; Hypertension; Colonic mass (2001); Irregular heart beat; H/O inguinal hernia; and Hernia, umbilical.  He is having a cardiac cath in two days.   He reports that since starting Coreg he has felt more fatigued and " feels like I am missing a step".   He tried taking Elavil and Prozac for fibromyalgia pain but he did not like the way it made him feel.  Denies any interval history.   He does not have an advanced directive or living will  Preventive Screening-Counseling & Management  Smoking Status: Never Smoker Second Hand Smoking status: No smokers in home  Risk Factors Regular exercise: Plays tennis 3-4 times a week for an hour each time Diet: eats healthy Fall Risk:  Cardiac risk factors:  advanced age (older than 1 for men, 27 for women)  No Hyperlipidemia No diabetes.  Family History: Father had quad bypass   Depression Screen None. PHQ2 0   Activities of Daily Living Independent ADLs and IADLs   Hearing Difficulties: patient declines  Cognitive Testing No reported trouble.   Normal 3 word recall  List the Names of Other Physician/Practitioners you currently use: 1. Cardiology - Dr. Meda Coffee   Immunization History  Administered Date(s) Administered  . Influenza, High Dose Seasonal PF 02/03/2015  . Pneumococcal Conjugate-13 02/03/2015   Required Immunizations needed today: Tdap  Screening tests- up to date There are no preventive care reminders to display for this patient.  ROS- See above  The following were reviewed and entered/updated in epic: Past Medical History  Diagnosis Date  . Brain tumor [facility])     age 28 - brain tumor removed left- benign - occas slight headaches since the surgery  . Fibromyalgia  1990's   . History of kidney stones     HX OF MULTIPLE KIDNEY STONES AND  CURRENTLY HAS BILATERAL RENAL STONES CAUSING ABDOMINAL AND BACK PAIN  . Headache(784.0)   . Arthritis     HANDS AND PT STATES HIS ARMS HURT  . Hypertension     high blood pressure readings   . Colonic mass 2001  . Irregular heart beat   . H/O inguinal hernia     right side  . Hernia, umbilical    Patient Active Problem List   Diagnosis Date Noted  . Essential hypertension 02/03/2015  . Ectopic beat, ventricular 02/03/2015   Past Surgical History  Procedure Laterality Date  . Brain tumor excision    . Lithotripsy    . Colon surgery  2001    COLON RESECTION FOR GROWTH - NOT CANCER  . Tonsillectomy    . Holmium laser application Right [id number]    Procedure: HOLMIUM LASER APPLICATION;  Surgeon: Alexis Frock, MD;  Location: [facility];  Service: Urology;  Laterality: Right;  . Cystoscopy/retrograde/ureteroscopy/stone extraction with basket Right 10/05/2012    Procedure: CYSTOSCOPY/RETROGRADE/URETEROSCOPY/STONE EXTRACTION WITH BASKET;  Surgeon: Alexis Frock, MD;  Location: [facility];  Service: Urology;  Laterality: Right;  . Cystoscopy with retrograde pyelogram, ureteroscopy and stent placement Left 10/05/2012    Procedure: [place], URETEROSCOPY AND STENT PLACEMENT;  Surgeon: Alexis Frock, MD;  Location: [facility];  Service: Urology;  Laterality: Left;  . Holmium laser application Bilateral [id number]    Procedure: HOLMIUM LASER APPLICATION;  Surgeon: Alexis Frock, MD;  Location:  [facility];  Service: Urology;  Laterality: Bilateral;  . Cystoscopy/retrograde/ureteroscopy/stone extraction with basket Bilateral 10/24/2012    Procedure: CYSTOSCOPY/RETROGRADE/URETEROSCOPY/STONE EXTRACTION WITH BASKET;  Surgeon: Alexis Frock, MD;  [facility];  Service: Urology;  Laterality: Bilateral;  with bilateral stent placement    Family History  Problem Relation Age of Onset  . Colon cancer Father    . Heart disease Father     quad bypass    Medications- reviewed and updated Current Outpatient Prescriptions  Medication Sig Dispense Refill  . amitriptyline (ELAVIL) 25 MG tablet Take 1 tablet (25 mg total) by mouth at bedtime. 30 tablet 3  . aspirin EC 81 MG tablet Take 1 tablet (81 mg total) by mouth daily. 90 tablet 3  . BEE POLLEN PO Take by mouth daily.    Marland Kitchen Bioflavonoid Products (BIOFLEX PO) Take by mouth daily.    . carvedilol (COREG) 3.125 MG tablet Take 1 tablet (3.125 mg total) by mouth 2 (two) times daily. 180 tablet 3  . Cranberry 1000 MG CAPS Take by mouth daily.    Marland Kitchen FLUoxetine (PROZAC) 20 MG tablet Take 1 tablet (20 mg total) by mouth daily. 30 tablet 3  . ibuprofen (ADVIL,MOTRIN) 200 MG tablet Take 200 mg by mouth. Take 3 pills in am and 3 pills at bedtime    . lisinopril (PRINIVIL,ZESTRIL) 5 MG tablet Take 1 tablet (5 mg total) by mouth daily. 30 tablet 3  . Multiple Vitamin (MULTIVITAMIN) tablet Take 1 tablet by mouth daily.    . Na Sulfate-K Sulfate-Mg Sulf (SUPREP BOWEL PREP) SOLN Suprep as directed / no substitutions 354 mL 0   No current facility-administered medications for this visit.    Allergies-reviewed and updated Allergies  Allergen Reactions  . Other Other (See Comments)    /mangos/caused a severe rash  . Sulfa Antibiotics Hives  . Vicodin [Hydrocodone-Acetaminophen] Itching    Tolerates oxycodone  . Erythromycin Rash    Many years ago an oral antibiotic caused a rash(wife and patient think it was erythromycin but they are not positive).    Social History   Social History  . Marital Status: Single    Spouse Name: N/A  . Number of Children: N/A  . Years of Education: N/A   Social History Main Topics  . Smoking status: Never Smoker   . Smokeless tobacco: Never Used  . Alcohol Use: 4.2 oz/week    7 Standard drinks or equivalent per week     Comment: OCCAS DRINK - BUT NOT DAILY  . Drug Use: No  . Sexual Activity: Not Asked   Other Topics  Concern  . None   Social History Narrative   Retired from Museum/gallery curator    Has one son - lives Social worker to play Tennis    Objective: BP 122/60 mmHg  Temp(Src) 97.8 F (36.6 C) (Oral)  Ht 6' (1.829 m)  Wt 173 lb (78.472 kg)  BMI 23.46 kg/m2 GENERAL: vitals reviewed and listed above, alert, oriented, appears well hydrated and in no acute distress  HEENT: atraumatic, conjunttiva clear, no obvious abnormalities on inspection of external nose and ears  NECK: Neck is soft and supple without masses, no adenopathy or thyromegaly, trachea midline, no JVD. Normal range of motion.   LUNGS: clear to auscultation bilaterally, no wheezes, rales or rhonchi, good air movement  CV: Bradycardic with frequent ectopic beat.  no audible murmurs, gallops, or rubs. No carotid bruit and no peripheral edema.   MS: moves all extremities  without noticeable abnormality. No edema noted  Abd: soft/nontender/nondistended/normal bowel sounds. Umbilical hernia noted.   Skin: warm and dry, no rash . Two concerning areas of BCC on nose. He has an appointment with dermotology  Extremities: No clubbing, cyanosis, or edema. Capillary refill is WNL. Pulses intact bilaterally in upper and lower extremities.   Neuro: CN II-XII intact, sensation and reflexes normal throughout, 5/5 muscle strength in bilateral upper and lower extremities. Normal finger to nose. Normal rapid alternating movements. Normal romberg. No pronator drift.   PSYCH: pleasant and cooperative, no obvious depression or anxiety  Assessment/Plan:  1. Essential hypertension - Controlled on current medication therapy  - Consider changing to CCB after cardiac cath due to fatigue possibly from BB.   2. Encounter for Medicare annual wellness exam -  Labs were drawn yesterday by cardiology. Reviewed these results with the patient.  - He has a cardiac cath coming up. Is due for colonoscopy but will wait until after cardiac cath is done.  - He  is due for Tdap - He will likely have this done prior to cath- will be covered by medicare for procedure.  - Information on advanced directives and living will given to patient.  - Follow up within two weeks following cardiac cath.    Return precautions advised.   [employer]

## 2015-04-07 NOTE — Telephone Encounter (Signed)
Notified the pt that per Dr Meda Coffee, his pre-cath labs look great, except his LDL is elevated, and she recommends that we start him on atorvastatin 10 mg po daily, and recheck a CMET in one month.  Confirmed the pharmacy of choice with the pt.  Scheduled the pt a lab appt for the same day as his follow-up appt with Dr Meda Coffee on 05/06/15.   Informed the pt that we will draw a cmet same day as his f/u appt. Pt verbalized understanding and agrees with this plan.

## 2015-04-07 NOTE — Progress Notes (Signed)
Pre visit review using our clinic review tool, if applicable. No additional management support is needed unless otherwise documented below in the visit note. 

## 2015-04-07 NOTE — Patient Instructions (Signed)
It was great seeing you again today!  Good luck on Thursday!  Follow up with me within two weeks after the cardiac cath   Make an appointment to see Dermatology.

## 2015-04-09 ENCOUNTER — Encounter (HOSPITAL_COMMUNITY): Payer: Self-pay | Admitting: Cardiovascular Disease

## 2015-04-09 ENCOUNTER — Encounter (HOSPITAL_COMMUNITY)
Admission: RE | Disposition: A | Discharge status: Home / Self Care | Payer: Self-pay | Source: Ambulatory Visit | Attending: Cardiovascular Disease

## 2015-04-09 ENCOUNTER — Ambulatory Visit (HOSPITAL_COMMUNITY)
Admission: RE | Admit: 2015-04-09 | Discharge: 2015-04-09 | Disposition: A | Discharge status: Home / Self Care | Payer: Medicare Other | Source: Ambulatory Visit | Attending: Cardiovascular Disease | Admitting: Cardiovascular Disease

## 2015-04-09 DIAGNOSIS — M797 Fibromyalgia: Secondary | ICD-10-CM | POA: Diagnosis not present

## 2015-04-09 DIAGNOSIS — I5189 Other ill-defined heart diseases: Secondary | ICD-10-CM | POA: Diagnosis not present

## 2015-04-09 DIAGNOSIS — M1990 Unspecified osteoarthritis, unspecified site: Secondary | ICD-10-CM | POA: Diagnosis not present

## 2015-04-09 DIAGNOSIS — Z885 Allergy status to narcotic agent status: Secondary | ICD-10-CM | POA: Insufficient documentation

## 2015-04-09 DIAGNOSIS — R002 Palpitations: Secondary | ICD-10-CM | POA: Diagnosis not present

## 2015-04-09 DIAGNOSIS — F419 Anxiety disorder, unspecified: Secondary | ICD-10-CM | POA: Diagnosis not present

## 2015-04-09 DIAGNOSIS — R5383 Other fatigue: Secondary | ICD-10-CM | POA: Diagnosis not present

## 2015-04-09 DIAGNOSIS — R55 Syncope and collapse: Secondary | ICD-10-CM | POA: Insufficient documentation

## 2015-04-09 DIAGNOSIS — Z882 Allergy status to sulfonamides status: Secondary | ICD-10-CM | POA: Diagnosis not present

## 2015-04-09 DIAGNOSIS — K429 Umbilical hernia without obstruction or gangrene: Secondary | ICD-10-CM | POA: Diagnosis not present

## 2015-04-09 DIAGNOSIS — I10 Essential (primary) hypertension: Secondary | ICD-10-CM | POA: Diagnosis not present

## 2015-04-09 DIAGNOSIS — Z87442 Personal history of urinary calculi: Secondary | ICD-10-CM | POA: Insufficient documentation

## 2015-04-09 DIAGNOSIS — R9439 Abnormal result of other cardiovascular function study: Secondary | ICD-10-CM | POA: Diagnosis present

## 2015-04-09 DIAGNOSIS — Z01810 Encounter for preprocedural cardiovascular examination: Secondary | ICD-10-CM

## 2015-04-09 DIAGNOSIS — I519 Heart disease, unspecified: Secondary | ICD-10-CM

## 2015-04-09 DIAGNOSIS — R079 Chest pain, unspecified: Secondary | ICD-10-CM

## 2015-04-09 DIAGNOSIS — Z8249 Family history of ischemic heart disease and other diseases of the circulatory system: Secondary | ICD-10-CM | POA: Insufficient documentation

## 2015-04-09 HISTORY — PX: CARDIAC CATHETERIZATION: SHX172

## 2015-04-09 SURGERY — LEFT HEART CATH AND CORONARY ANGIOGRAPHY
Anesthesia: LOCAL

## 2015-04-09 MED ORDER — SODIUM CHLORIDE 0.9 % IV SOLN: 250.0000 mL | INTRAVENOUS | Status: DC | PRN

## 2015-04-09 MED ORDER — FENTANYL CITRATE (PF) 100 MCG/2ML IJ SOLN
INTRAMUSCULAR | Status: DC | PRN
  Administered 2015-04-09: 25 ug via INTRAVENOUS

## 2015-04-09 MED ORDER — MIDAZOLAM HCL 2 MG/2ML IJ SOLN
INTRAMUSCULAR | Status: AC
  Filled 2015-04-09: qty 2

## 2015-04-09 MED ORDER — SODIUM CHLORIDE 0.9% FLUSH: 3.0000 mL | INTRAVENOUS | Status: DC | PRN

## 2015-04-09 MED ORDER — IOHEXOL 350 MG/ML SOLN
INTRAVENOUS | Status: DC | PRN
  Administered 2015-04-09: 90 mL via INTRACARDIAC

## 2015-04-09 MED ORDER — MIDAZOLAM HCL 2 MG/2ML IJ SOLN
INTRAMUSCULAR | Status: DC | PRN
  Administered 2015-04-09: 2 mg via INTRAVENOUS

## 2015-04-09 MED ORDER — SODIUM CHLORIDE 0.9 % IV SOLN: INTRAVENOUS | Status: AC

## 2015-04-09 MED ORDER — HEPARIN SODIUM (PORCINE) 1000 UNIT/ML IJ SOLN
INTRAMUSCULAR | Status: DC | PRN
  Administered 2015-04-09: 4000 [IU] via INTRAVENOUS

## 2015-04-09 MED ORDER — SODIUM CHLORIDE 0.9 % WEIGHT BASED INFUSION
3.0000 mL/kg/h | INTRAVENOUS | Status: AC
  Administered 2015-04-09: 3 mL/kg/h via INTRAVENOUS

## 2015-04-09 MED ORDER — LIDOCAINE HCL (PF) 1 % IJ SOLN
INTRAMUSCULAR | Status: AC
  Filled 2015-04-09: qty 30

## 2015-04-09 MED ORDER — SODIUM CHLORIDE 0.9 % WEIGHT BASED INFUSION: 1.0000 mL/kg/h | INTRAVENOUS | Status: DC

## 2015-04-09 MED ORDER — SODIUM CHLORIDE 0.9% FLUSH: 3.0000 mL | Freq: Two times a day (BID) | INTRAVENOUS | Status: DC

## 2015-04-09 MED ORDER — FENTANYL CITRATE (PF) 100 MCG/2ML IJ SOLN
INTRAMUSCULAR | Status: AC
  Filled 2015-04-09: qty 2

## 2015-04-09 MED ORDER — LIDOCAINE HCL (PF) 1 % IJ SOLN
INTRAMUSCULAR | Status: DC | PRN
  Administered 2015-04-09: 4 mL via SUBCUTANEOUS

## 2015-04-09 MED ORDER — VERAPAMIL HCL 2.5 MG/ML IV SOLN
INTRAVENOUS | Status: DC | PRN
  Administered 2015-04-09: 10:00:00 via INTRA_ARTERIAL

## 2015-04-09 MED ORDER — HEPARIN (PORCINE) IN NACL 2-0.9 UNIT/ML-% IJ SOLN
INTRAMUSCULAR | Status: AC
  Filled 2015-04-09: qty 1000

## 2015-04-09 MED ORDER — HEPARIN (PORCINE) IN NACL 2-0.9 UNIT/ML-% IJ SOLN
INTRAMUSCULAR | Status: DC | PRN
  Administered 2015-04-09: 11:00:00

## 2015-04-09 MED ORDER — ASPIRIN 81 MG PO CHEW: 81.0000 mg | CHEWABLE_TABLET | ORAL | Status: DC

## 2015-04-09 MED ORDER — HEPARIN SODIUM (PORCINE) 1000 UNIT/ML IJ SOLN
INTRAMUSCULAR | Status: AC
  Filled 2015-04-09: qty 1

## 2015-04-09 SURGICAL SUPPLY — 11 items
CATH INFINITI 5 FR JL3.5 (CATHETERS) ×1 IMPLANT
CATH INFINITI 5FR ANG PIGTAIL (CATHETERS) ×1 IMPLANT
CATH INFINITI JR4 5F (CATHETERS) ×1 IMPLANT
DEVICE RAD COMP TR BAND LRG (VASCULAR PRODUCTS) ×2 IMPLANT
GLIDESHEATH SLEND SS 6F .021 (SHEATH) ×2 IMPLANT
KIT HEART LEFT (KITS) ×2 IMPLANT
PACK CARDIAC CATHETERIZATION (CUSTOM PROCEDURE TRAY) ×2 IMPLANT
SYR MEDRAD MARK V 150ML (SYRINGE) ×2 IMPLANT
TRANSDUCER W/STOPCOCK (MISCELLANEOUS) ×2 IMPLANT
TUBING CIL FLEX 10 FLL-RA (TUBING) ×2 IMPLANT
WIRE SAFE-T 1.5MM-J .035X260CM (WIRE) ×2 IMPLANT

## 2015-04-09 NOTE — Discharge Instructions (Signed)
Radial Site Care °Refer to this sheet in the next few weeks. These instructions provide you with information about caring for yourself after your procedure. Your health care provider may also give you more specific instructions. Your treatment has been planned according to current medical practices, but problems sometimes occur. Call your health care provider if you have any problems or questions after your procedure. °WHAT TO EXPECT AFTER THE PROCEDURE °After your procedure, it is typical to have the following: °· Bruising at the radial site that usually fades within 1-2 weeks. °· Blood collecting in the tissue (hematoma) that may be painful to the touch. It should usually decrease in size and tenderness within 1-2 weeks. °HOME CARE INSTRUCTIONS °· Take medicines only as directed by your health care provider. °· You may shower 24-48 hours after the procedure or as directed by your health care provider. Remove the bandage (dressing) and gently wash the site with plain soap and water. Pat the area dry with a clean towel. Do not rub the site, because this may cause bleeding. °· Do not take baths, swim, or use a hot tub until your health care provider approves. °· Check your insertion site every day for redness, swelling, or drainage. °· Do not apply powder or lotion to the site. °· Do not flex or bend the affected arm for 24 hours or as directed by your health care provider. °· Do not push or pull heavy objects with the affected arm for 24 hours or as directed by your health care provider. °· Do not lift over 10 lb (4.5 kg) for 5 days after your procedure or as directed by your health care provider. °· Ask your health care provider when it is okay to: °¨ Return to work or school. °¨ Resume usual physical activities or sports. °¨ Resume sexual activity. °· Do not drive home if you are discharged the same day as the procedure. Have someone else drive you. °· You may drive 24 hours after the procedure unless otherwise  instructed by your health care provider. °· Do not operate machinery or power tools for 24 hours after the procedure. °· If your procedure was done as an outpatient procedure, which means that you went home the same day as your procedure, a responsible adult should be with you for the first 24 hours after you arrive home. °· Keep all follow-up visits as directed by your health care provider. This is important. °SEEK MEDICAL CARE IF: °· You have a fever. °· You have chills. °· You have increased bleeding from the radial site. Hold pressure on the site and call 911. °SEEK IMMEDIATE MEDICAL CARE IF: °· You have unusual pain at the radial site. °· You have redness, warmth, or swelling at the radial site. °· You have drainage (other than a small amount of blood on the dressing) from the radial site. °· The radial site is bleeding, and the bleeding does not stop after 30 minutes of holding steady pressure on the site. °· Your arm or hand becomes pale, cool, tingly, or numb. °  °This information is not intended to replace advice given to you by your health care provider. Make sure you discuss any questions you have with your health care provider. °  °Document Released: 02/26/2010 Document Revised: 02/14/2014 Document Reviewed: 08/12/2013 °Elsevier Interactive Patient Education ©2016 [employer] Inc[employer] ° °

## 2015-04-09 NOTE — H&P (View-Only) (Signed)
Patient ID: [name], male   DOB: October 13, 1940, 75 y.o.   MRN: [id number]      Cardiology Office Note   Date:  03/20/2015   ID:  [name], DOB Aug 12, 1940, MRN [id number]  PCP:  Dorothyann Peng, NP  Cardiologist:  Dorothy Spark, MD  Referring physician: Dr Greer Pickerel from [facility] surgery  Chief complain: Fatigue, palpitations, DOE   History of Present Illness: [name] is a 75 y.o. male who presents for preoperative evaluation for hernia repair. 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 He has noticed lately that he has profound fatigue and some dyspnea on exertion. He is also noticed significant skipped beats especially at night. His concern is that he has anxiety attacks associated with palpitations with presyncopal episodes but no syncope. He denies any chest pain, no claudications no orthopnea or proximal nocturnal dyspnea. His father had heart attack and bypass surgery at age of 40 and passed away at age of 58. The patient has never smoked.  Past Medical History  Diagnosis Date  . Brain tumor [facility])     age 56 - brain tumor removed left- benign - occas slight headaches since the surgery  . Fibromyalgia 1990's   . History of kidney stones     HX OF MULTIPLE KIDNEY STONES AND  CURRENTLY HAS BILATERAL RENAL STONES CAUSING ABDOMINAL AND BACK PAIN  . Headache(784.0)   . Arthritis     HANDS AND PT STATES HIS ARMS HURT  . Hypertension     high blood pressure readings   . Colonic mass 2001  . Irregular heart beat   . H/O inguinal hernia     right side  . Hernia, umbilical     Past Surgical History  Procedure Laterality Date  . Brain tumor excision    . Lithotripsy    . Colon surgery  2001    COLON RESECTION FOR GROWTH - NOT CANCER  . Tonsillectomy    . Holmium laser application Right [id number]    Procedure: HOLMIUM LASER APPLICATION;   Surgeon: Alexis Frock, MD;  [facility];  Service: Urology;  Laterality: Right;  . Cystoscopy/retrograde/ureteroscopy/stone extraction with basket Right 10/05/2012    Procedure: CYSTOSCOPY/RETROGRADE/URETEROSCOPY/STONE EXTRACTION WITH BASKET;  Surgeon: Alexis Frock, MD;  [facility];  Service: Urology;  Laterality: Right;  . Cystoscopy with retrograde pyelogram, ureteroscopy and stent placement Left 10/05/2012    Procedure: [place], URETEROSCOPY AND STENT PLACEMENT;  Surgeon: Alexis Frock, MD;  [facility];  Service: Urology;  Laterality: Left;  . Holmium laser application Bilateral [id number]    Procedure: HOLMIUM LASER APPLICATION;  Surgeon: Alexis Frock, MD;  [facility];  Service: Urology;  Laterality: Bilateral;  . Cystoscopy/retrograde/ureteroscopy/stone extraction with basket Bilateral 10/24/2012    Procedure: CYSTOSCOPY/RETROGRADE/URETEROSCOPY/STONE EXTRACTION WITH BASKET;  Surgeon: Alexis Frock, MD;  [facility];  Service: Urology;  Laterality: Bilateral;  with bilateral stent placement     Current Outpatient Prescriptions  Medication Sig Dispense Refill  . amitriptyline (ELAVIL) 25 MG tablet Take 1 tablet (25 mg total) by mouth at bedtime. 30 tablet 3  . BEE POLLEN PO Take by mouth daily.    Marland Kitchen Bioflavonoid Products (BIOFLEX PO) Take by mouth daily.    . Cranberry 1000 MG CAPS Take by mouth daily.    Marland Kitchen FLUoxetine (PROZAC) 20 MG tablet Take  1 tablet (20 mg total) by mouth daily. 30 tablet 3  . ibuprofen (ADVIL,MOTRIN) 200 MG tablet Take 200 mg by mouth. Take 3 pills in am and 3 pills at bedtime    . lisinopril (PRINIVIL,ZESTRIL) 5 MG tablet Take 1 tablet (5 mg total) by mouth daily. 30 tablet 3  . Multiple Vitamin (MULTIVITAMIN) tablet Take 1 tablet by mouth daily.    . Na Sulfate-K Sulfate-Mg Sulf (SUPREP BOWEL PREP) SOLN Suprep as directed / no substitutions 354 mL 0   No current facility-administered medications for this  visit.    Allergies:   Other; Sulfa antibiotics; Vicodin; and Erythromycin    Social History:  The patient  reports that he has never smoked. He has never used smokeless tobacco. He reports that he drinks about 4.2 oz of alcohol per week. He reports that he does not use illicit drugs.   Family History:  The patient's family history includes Colon cancer in his father; Heart disease in his father.    ROS:  Please see the history of present illness.   Otherwise, review of systems are positive for none.   All other systems are reviewed and negative.    PHYSICAL EXAM: VS:  BP 146/82 mmHg  Pulse 73  Ht 6' (1.829 m)  Wt 172 lb (78.019 kg)  BMI 23.32 kg/m2 , BMI Body mass index is 23.32 kg/(m^2). GEN: Well nourished, well developed, in no acute distress HEENT: normal Neck: no JVD, carotid bruits, or masses Cardiac: RRR; no murmurs, rubs, or gallops,no edema  Respiratory:  clear to auscultation bilaterally, normal work of breathing GI: soft, nontender, nondistended, + BS MS: no deformity or atrophy Skin: warm and dry, no rash Neuro:  Strength and sensation are intact Psych: euthymic mood, full affect   EKG:  EKG is ordered today. The ekg ordered today demonstrates sinus rhythm with frequent PVCs, nonspecific ST and T-wave abnormalities. There is prolonged QT interval. QTC 488, QTC 522 ms.  Recent Labs: No results found for requested labs within last 365 days.   Lipid Panel No results found for: CHOL, TRIG, HDL, CHOLHDL, VLDL, LDLCALC, LDLDIRECT   Wt Readings from Last 3 Encounters:  03/20/15 172 lb (78.019 kg)  03/17/15 172 lb 9.6 oz (78.291 kg)  02/03/15 173 lb 1.6 oz (78.518 kg)      ASSESSMENT AND PLAN:  1. Fatigue, dyspnea on exertion - we will schedule stress test to rule out ischemia. We'll also order an echocardiogram to evaluate for systolic and diastolic function.  2. Very frequent monomorphic PVCs - first to evaluate LVEF with echocardiogram, also have to rule  out ischemia. We will order a 48-hour Holter monitor to evaluate for absolute number in 24-hour period.  3. Palpitations, anxiety - there is a concern that these in fact might be ventricular tachycardia considering his frequent PVCs, we will order a 48-hour Holter monitor to further evaluate.  4. Prolonged QT - possibly secondary to amitriptyline we will recheck at the next visit and potentially change if still prolonged.  5. Preoperative evaluation for hernia repair - we will, and on that once we have results from the tests stated above.  Follow up in 2 months.   Signed, Dorothy Spark, MD  03/20/2015 10:50 AM    [address], [place], [place]  [address] Phone: [phone]; Fax: [phone]

## 2015-04-09 NOTE — Interval H&P Note (Signed)
History and Physical Interval Note:  04/09/2015 9:57 AM  [name]  has presented today for cardiac cath with the diagnosis of abnormal stress test, new LV dysfunction  The various methods of treatment have been discussed with the patient and family. After consideration of risks, benefits and other options for treatment, the patient has consented to  Procedure(s): Left Heart Cath and Coronary Angiography (N/A) as a surgical intervention .  The patient's history has been reviewed, patient examined, no change in status, stable for surgery.  I have reviewed the patient's chart and labs.  Questions were answered to the patient's satisfaction.  Cath Lab Visit (complete for each Cath Lab visit)  Clinical Evaluation Leading to the Procedure:   ACS: No.  Non-ACS:    Anginal Classification: No Symptoms  Anti-ischemic medical therapy: No Therapy  Non-Invasive Test Results: Low-risk stress test findings: cardiac mortality <1%/year  Prior CABG: No previous CABG           Symir Mah

## 2015-05-06 ENCOUNTER — Ambulatory Visit: Payer: Medicare Other | Admitting: Cardiology

## 2015-05-06 ENCOUNTER — Other Ambulatory Visit: Payer: Medicare Other

## 2015-06-09 ENCOUNTER — Other Ambulatory Visit: Payer: Self-pay | Admitting: Adult Health

## 2015-10-14 ENCOUNTER — Other Ambulatory Visit: Payer: Self-pay | Admitting: Adult Health

## 2015-10-14 NOTE — Telephone Encounter (Signed)
Ok to refill 90 +3 

## 2015-12-21 DIAGNOSIS — R3912 Poor urinary stream: Secondary | ICD-10-CM | POA: Diagnosis not present

## 2015-12-21 DIAGNOSIS — N401 Enlarged prostate with lower urinary tract symptoms: Secondary | ICD-10-CM | POA: Diagnosis not present

## 2015-12-21 DIAGNOSIS — N200 Calculus of kidney: Secondary | ICD-10-CM | POA: Diagnosis not present

## 2016-04-04 ENCOUNTER — Other Ambulatory Visit: Payer: Self-pay | Admitting: Cardiology

## 2016-04-04 DIAGNOSIS — R9439 Abnormal result of other cardiovascular function study: Secondary | ICD-10-CM

## 2016-04-04 DIAGNOSIS — I10 Essential (primary) hypertension: Secondary | ICD-10-CM

## 2016-04-04 DIAGNOSIS — I519 Heart disease, unspecified: Secondary | ICD-10-CM

## 2016-04-04 DIAGNOSIS — Z01812 Encounter for preprocedural laboratory examination: Secondary | ICD-10-CM

## 2016-04-04 DIAGNOSIS — I493 Ventricular premature depolarization: Secondary | ICD-10-CM

## 2016-05-08 ENCOUNTER — Other Ambulatory Visit: Payer: Self-pay | Admitting: Cardiology

## 2016-05-22 ENCOUNTER — Other Ambulatory Visit: Payer: Self-pay | Admitting: Cardiology

## 2016-05-22 DIAGNOSIS — I519 Heart disease, unspecified: Secondary | ICD-10-CM

## 2016-05-22 DIAGNOSIS — Z01812 Encounter for preprocedural laboratory examination: Secondary | ICD-10-CM

## 2016-05-22 DIAGNOSIS — R9439 Abnormal result of other cardiovascular function study: Secondary | ICD-10-CM

## 2016-05-22 DIAGNOSIS — I493 Ventricular premature depolarization: Secondary | ICD-10-CM

## 2016-05-22 DIAGNOSIS — I10 Essential (primary) hypertension: Secondary | ICD-10-CM

## 2016-06-02 ENCOUNTER — Other Ambulatory Visit: Payer: Self-pay | Admitting: Cardiology

## 2016-06-02 DIAGNOSIS — I519 Heart disease, unspecified: Secondary | ICD-10-CM

## 2016-06-02 DIAGNOSIS — I10 Essential (primary) hypertension: Secondary | ICD-10-CM

## 2016-06-02 DIAGNOSIS — I493 Ventricular premature depolarization: Secondary | ICD-10-CM

## 2016-06-02 DIAGNOSIS — R9439 Abnormal result of other cardiovascular function study: Secondary | ICD-10-CM

## 2016-06-02 DIAGNOSIS — Z01812 Encounter for preprocedural laboratory examination: Secondary | ICD-10-CM

## 2016-07-10 ENCOUNTER — Other Ambulatory Visit: Payer: Self-pay | Admitting: Cardiology

## 2016-07-10 DIAGNOSIS — Z01812 Encounter for preprocedural laboratory examination: Secondary | ICD-10-CM

## 2016-07-10 DIAGNOSIS — I493 Ventricular premature depolarization: Secondary | ICD-10-CM

## 2016-07-10 DIAGNOSIS — I10 Essential (primary) hypertension: Secondary | ICD-10-CM

## 2016-07-10 DIAGNOSIS — R9439 Abnormal result of other cardiovascular function study: Secondary | ICD-10-CM

## 2016-07-10 DIAGNOSIS — I519 Heart disease, unspecified: Secondary | ICD-10-CM

## 2016-07-22 ENCOUNTER — Other Ambulatory Visit: Payer: Self-pay | Admitting: Cardiology

## 2016-07-22 DIAGNOSIS — I519 Heart disease, unspecified: Secondary | ICD-10-CM

## 2016-07-22 DIAGNOSIS — I10 Essential (primary) hypertension: Secondary | ICD-10-CM

## 2016-07-22 DIAGNOSIS — I493 Ventricular premature depolarization: Secondary | ICD-10-CM

## 2016-07-22 DIAGNOSIS — R9439 Abnormal result of other cardiovascular function study: Secondary | ICD-10-CM

## 2016-07-22 DIAGNOSIS — Z01812 Encounter for preprocedural laboratory examination: Secondary | ICD-10-CM

## 2016-07-25 ENCOUNTER — Encounter: Payer: Self-pay | Admitting: Physician Assistant

## 2016-07-25 DIAGNOSIS — I493 Ventricular premature depolarization: Secondary | ICD-10-CM | POA: Insufficient documentation

## 2016-07-25 DIAGNOSIS — I491 Atrial premature depolarization: Secondary | ICD-10-CM | POA: Insufficient documentation

## 2016-07-25 DIAGNOSIS — I4729 Other ventricular tachycardia: Secondary | ICD-10-CM | POA: Insufficient documentation

## 2016-07-25 DIAGNOSIS — I472 Ventricular tachycardia: Secondary | ICD-10-CM | POA: Insufficient documentation

## 2016-07-25 NOTE — Progress Notes (Signed)
Cardiology Office Note    Date:  07/26/2016  ID:  [name], DOB 1941-01-02, MRN [id number] PCP:  Alex Peng, NP  Cardiologist:  Dr. Meda Gregory   Chief Complaint: medication follow-up  History of Present Illness:  [name] is a 76 y.o. male with history of frequent PVCs, PACs, NSVT, NICM, benign brain tumor removal, fibromyalgia, kidney stones, arthritis, essential HTN who presents for med refill follow-up. He was last seen by Dr. Meda Gregory 03/2015 for pre-op evaluation of hernia repair. He was reporting fatigue, dyspnea on exertion and frequent palpitations. He was found to have frequent monomorphic PVCs as well as prolonged QT (Dr. Meda Gregory questioned r/t amitriptyline). 2D Echo 04/02/15: EF 45%, moderate MR, mild AI. Nuc 04/01/15 was abnormal, with hypertensive response to exercise and study suggestive of chronotropic incompetence with peak HR 113bpm. Holter 04/01/16 showed frequent polymorphic PVCs (24000 in 24 hours acounting for 16% of all beats), frequent bigeminy, couplets, triplets, and NSVT consisting of 5 beats, and very frequent PACs (15000 in 24 hours). Cardiac cath 04/09/15 showed no evidence of CAD, EF 45-50%. The patient cancelled follow-up with Dr. Meda Gregory. Labs 03/2015 showed K 4.2, Cr 1.0, LDL 120, Hgb 14.3, Plt 301.  He returns for follow-up today - overall he is doing well and remaining active, but continues to note intermittent fatigue and dizziness/lightheadedness. He had an episode of syncope at a [employer] many years ago but none since that time. He remains active playing tennis and has not had any recent functional limitation including CP or SOB. In fact, he feels better when exercising regularly. He also reports persistent ringing of his ears. No hearing loss or other focal neurologic symptoms.  Past Medical History:  Diagnosis Date  . Arthritis    HANDS AND PT STATES HIS ARMS HURT  . Brain tumor [facility])    age 27 - brain tumor removed left- benign - occas  slight headaches since the surgery  . Colonic mass 2001  . Fibromyalgia 1990's   . Frequent PVCs   . H/O inguinal hernia    right side  . Headache(784.0)   . Hernia, umbilical   . History of kidney stones    HX OF MULTIPLE KIDNEY STONES AND  CURRENTLY HAS BILATERAL RENAL STONES CAUSING ABDOMINAL AND BACK PAIN  . Hypertension    high blood pressure readings   . NICM (nonischemic cardiomyopathy) ([place])   . NSVT (nonsustained ventricular tachycardia) ([place])   . Premature atrial contractions   . Prolonged Q-T interval on ECG     Past Surgical History:  Procedure Laterality Date  . BRAIN TUMOR EXCISION    . CARDIAC CATHETERIZATION N/A 04/09/2015   Procedure: Left Heart Cath and Coronary Angiography;  Surgeon: Alex Blanks, MD;  Location: [facility];  Service: Cardiovascular;  Laterality: N/A;  . COLON SURGERY  2001   COLON RESECTION FOR GROWTH - NOT CANCER  . CYSTOSCOPY WITH RETROGRADE PYELOGRAM, URETEROSCOPY AND STENT PLACEMENT Left 10/05/2012   Procedure: CYSTOSCOPY WITH RETROGRADE PYELOGRAM, URETEROSCOPY AND STENT PLACEMENT;  Surgeon: Alex Frock, MD;  Location: [facility];  Service: Urology;  Laterality: Left;  . CYSTOSCOPY/RETROGRADE/URETEROSCOPY/STONE EXTRACTION WITH BASKET Right 10/05/2012   Procedure: CYSTOSCOPY/RETROGRADE/URETEROSCOPY/STONE EXTRACTION WITH BASKET;  Surgeon: Alex Frock, MD;  Location: [facility];  Service: Urology;  Laterality: Right;  . CYSTOSCOPY/RETROGRADE/URETEROSCOPY/STONE EXTRACTION WITH BASKET Bilateral 10/24/2012   Procedure: CYSTOSCOPY/RETROGRADE/URETEROSCOPY/STONE EXTRACTION WITH BASKET;  Surgeon: Alex Frock, MD;  Location: [facility];  Service: Urology;  Laterality: Bilateral;  with bilateral  stent placement  . HOLMIUM LASER APPLICATION Right 5/45/6256   Procedure: HOLMIUM LASER APPLICATION;  Surgeon: Alex Frock, MD;  Location: [facility];  Service: Urology;  Laterality: Right;  . HOLMIUM LASER APPLICATION Bilateral 3/89/3734   Procedure:  HOLMIUM LASER APPLICATION;  Surgeon: Alex Frock, MD;  Location: [facility];  Service: Urology;  Laterality: Bilateral;  . LITHOTRIPSY    . TONSILLECTOMY      Current Medications: Current Outpatient Prescriptions  Medication Sig Dispense Refill  . aspirin EC 81 MG tablet Take 1 tablet (81 mg total) by mouth daily. 90 tablet 3  . atorvastatin (LIPITOR) 10 MG tablet TAKE 1 TABLET BY MOUTH EVERY DAY 15 tablet 0  . BEE POLLEN PO Take by mouth daily.    Marland Kitchen Bioflavonoid Products (BIOFLEX PO) Take 2 tablets by mouth daily.     . carvedilol (COREG) 3.125 MG tablet TAKE 1 TABLET (3.125 MG TOTAL) BY MOUTH 2 (TWO) TIMES DAILY. 30 tablet 0  . Cranberry 1000 MG CAPS Take 1,000 mg by mouth daily.     Marland Kitchen FLUoxetine (PROZAC) 20 MG tablet Take 1 tablet (20 mg total) by mouth daily. 30 tablet 3  . ibuprofen (ADVIL,MOTRIN) 200 MG tablet Take 600 mg by mouth 2 (two) times daily. Take 3 pills in am and 3 pills at bedtime    . lisinopril (PRINIVIL,ZESTRIL) 5 MG tablet TAKE 1 TABLET (5 MG TOTAL) BY MOUTH DAILY. 90 tablet 3  . Multiple Vitamin (MULTIVITAMIN) tablet Take 1 tablet by mouth daily.    . Na Sulfate-K Sulfate-Mg Sulf (SUPREP BOWEL PREP) SOLN Suprep as directed / no substitutions 354 mL 0   No current facility-administered medications for this visit.      Allergies:   Other; Sulfa antibiotics; Vicodin [hydrocodone-acetaminophen]; and Erythromycin   Social History   Social History  . Marital status: Single    Spouse name: N/A  . Number of children: N/A  . Years of education: N/A   Social History Main Topics  . Smoking status: Never Smoker  . Smokeless tobacco: Never Used  . Alcohol use 4.2 oz/week    7 Standard drinks or equivalent per week     Comment: OCCAS DRINK - BUT NOT DAILY  . Drug use: No  . Sexual activity: Not Asked   Other Topics Concern  . None   Social History Narrative   Retired from Museum/gallery curator    Has one son - lives Social worker to play Tennis     Family History:    Family History  Problem Relation Age of Onset  . Colon cancer Father   . Heart disease Father        quad bypass    ROS:   Please see the history of present illness.  All other systems are reviewed and otherwise negative.    PHYSICAL EXAM:   VS:  BP 132/80   Pulse (!) 55   Ht 6' (1.829 m)   Wt 175 lb (79.4 kg)   SpO2 97%   BMI 23.73 kg/m   BMI: Body mass index is 23.73 kg/m. GEN: Well nourished, well developed WM, in no acute distress  HEENT: normocephalic, atraumatic Neck: no JVD, carotid bruits, or masses CardiacRRR; no murmurs, rubs, or gallops, no edema  Respiratory:  clear to auscultation bilaterally, normal work of breathing GI: soft, nontender, nondistended, + BS MS: no deformity or atrophy  Skin: warm and dry, no rash Neuro:  Alert and Oriented x 3, Strength and sensation are  intact, follows commands Psych: euthymic mood, full affect  Wt Readings from Last 3 Encounters:  07/26/16 175 lb (79.4 kg)  04/09/15 173 lb (78.5 kg)  04/07/15 173 lb (78.5 kg)      Studies/Labs Reviewed:   EKG:  EKG was ordered today and personally reviewed by me and demonstrates sinus bradycardia 50bpm, QTc 466ms  Recent Labs: No results found for requested labs within last 8760 hours.   Lipid Panel    Component Value Date/Time   CHOL 185 04/06/2015 0837   TRIG 111 04/06/2015 0837   HDL 43 04/06/2015 0837   CHOLHDL 4.3 04/06/2015 0837   VLDL 22 04/06/2015 0837   LDLCALC 120 04/06/2015 0837    Additional studies/ records that were reviewed today include: Summarized above.    ASSESSMENT & PLAN:   1. Frequent PVCs, NSVT, PACs - note previous plan to refer to EP as these could be contributing to cardiomyopathy. He's not feeling the palpitations as much as before but remains with mild dizziness that comes and goes at random. No recent syncope. Will check TSH along with lytes and refer to EP. 2. 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 3. Nonischemic cardiomyopathy - continue BB and ACEI. Pending further eval from EP to determine whether he may have a PVC cardiomyopathy. 4. Essential HTN - BP marginally higher than ideal but given h/o dizziness, will not push up meds at this time. 5. Prolonged QT - resolved off amitriptyline. 6. Hyperlipidemia - recheck CMET today. OK to refill atorvastatin. 7. Tinnitus - refer to ENT. No focal neuro sx on exam otherwise.  Disposition: Refer to EP as previously recommended.   Medication Adjustments/Labs and Tests Ordered: Current medicines are reviewed at length with the patient today.  Concerns regarding medicines are outlined above. Medication changes, Labs and Tests ordered today are summarized above and listed in the Patient Instructions accessible in Encounters.   Signed, Charlie Pitter, PA-C  07/26/2016 9:13 AM    [facility] [address], [place], [place]  [address] Phone: [phone]; Fax: [phone]

## 2016-07-26 ENCOUNTER — Encounter (INDEPENDENT_AMBULATORY_CARE_PROVIDER_SITE_OTHER): Payer: Self-pay

## 2016-07-26 ENCOUNTER — Ambulatory Visit (INDEPENDENT_AMBULATORY_CARE_PROVIDER_SITE_OTHER): Payer: Medicare Other | Admitting: Physician Assistant

## 2016-07-26 ENCOUNTER — Encounter: Payer: Self-pay | Admitting: Physician Assistant

## 2016-07-26 VITALS — BP 132/80 | HR 55 | Ht 72.0 in | Wt 175.0 lb

## 2016-07-26 DIAGNOSIS — R9431 Abnormal electrocardiogram [ECG] [EKG]: Secondary | ICD-10-CM

## 2016-07-26 DIAGNOSIS — I4729 Other ventricular tachycardia: Secondary | ICD-10-CM

## 2016-07-26 DIAGNOSIS — H9319 Tinnitus, unspecified ear: Secondary | ICD-10-CM

## 2016-07-26 DIAGNOSIS — I420 Dilated cardiomyopathy: Secondary | ICD-10-CM | POA: Diagnosis not present

## 2016-07-26 DIAGNOSIS — I10 Essential (primary) hypertension: Secondary | ICD-10-CM

## 2016-07-26 DIAGNOSIS — I428 Other cardiomyopathies: Secondary | ICD-10-CM | POA: Diagnosis not present

## 2016-07-26 DIAGNOSIS — I493 Ventricular premature depolarization: Secondary | ICD-10-CM | POA: Diagnosis not present

## 2016-07-26 DIAGNOSIS — I491 Atrial premature depolarization: Secondary | ICD-10-CM | POA: Diagnosis not present

## 2016-07-26 DIAGNOSIS — E785 Hyperlipidemia, unspecified: Secondary | ICD-10-CM

## 2016-07-26 DIAGNOSIS — I472 Ventricular tachycardia: Secondary | ICD-10-CM | POA: Diagnosis not present

## 2016-07-26 DIAGNOSIS — I4589 Other specified conduction disorders: Secondary | ICD-10-CM | POA: Diagnosis not present

## 2016-07-26 DIAGNOSIS — R5383 Other fatigue: Secondary | ICD-10-CM | POA: Diagnosis not present

## 2016-07-26 LAB — COMPREHENSIVE METABOLIC PANEL
ALBUMIN: 4.2 g/dL (ref 3.5–4.8)
ALK PHOS: 80 IU/L (ref 39–117)
ALT: 18 IU/L (ref 0–44)
AST: 32 IU/L (ref 0–40)
Albumin/Globulin Ratio: 2 (ref 1.2–2.2)
BILIRUBIN TOTAL: 0.6 mg/dL (ref 0.0–1.2)
BUN / CREAT RATIO: 23 (ref 10–24)
BUN: 26 mg/dL (ref 8–27)
CHLORIDE: 103 mmol/L (ref 96–106)
CO2: 21 mmol/L (ref 20–29)
CREATININE: 1.11 mg/dL (ref 0.76–1.27)
Calcium: 9 mg/dL (ref 8.6–10.2)
GFR calc Af Amer: 75 mL/min/{1.73_m2} (ref >59)
GFR calc non Af Amer: 65 mL/min/{1.73_m2} (ref >59)
GLOBULIN, TOTAL: 2.1 g/dL (ref 1.5–4.5)
GLUCOSE: 83 mg/dL (ref 65–99)
Potassium: 4.2 mmol/L (ref 3.5–5.2)
SODIUM: 140 mmol/L (ref 134–144)
Total Protein: 6.3 g/dL (ref 6.0–8.5)

## 2016-07-26 LAB — MAGNESIUM: MAGNESIUM: 2 mg/dL (ref 1.6–2.3)

## 2016-07-26 LAB — TSH: TSH: 1.03 u[IU]/mL (ref 0.450–4.500)

## 2016-07-26 MED ORDER — ATORVASTATIN CALCIUM 10 MG PO TABS: 10.0000 mg | ORAL_TABLET | Freq: Every day | ORAL | 3 refills | Status: DC

## 2016-07-26 MED ORDER — CARVEDILOL 3.125 MG PO TABS: 3.1250 mg | ORAL_TABLET | Freq: Two times a day (BID) | ORAL | 0 refills | Status: DC

## 2016-07-26 NOTE — Patient Instructions (Signed)
Medication Instructions:  Your physician recommends that you continue on your current medications as directed. Please refer to the Current Medication list given to you today.   Labwork: Your physician recommends that you return for lab work today for CMET, MAGNESIUM, TSH  Testing/Procedures: None Ordered   Follow-Up: referral to EP  Any Other Special Instructions Will Be Listed Below (If Applicable).     If you need a refill on your cardiac medications before your next appointment, please call your pharmacy.

## 2016-08-04 ENCOUNTER — Encounter: Payer: Self-pay | Admitting: Internal Medicine

## 2016-08-04 ENCOUNTER — Ambulatory Visit (INDEPENDENT_AMBULATORY_CARE_PROVIDER_SITE_OTHER): Payer: Medicare Other | Admitting: Internal Medicine

## 2016-08-04 VITALS — BP 154/98 | HR 58 | Ht 72.0 in | Wt 176.4 lb

## 2016-08-04 DIAGNOSIS — I472 Ventricular tachycardia: Secondary | ICD-10-CM | POA: Diagnosis not present

## 2016-08-04 DIAGNOSIS — I10 Essential (primary) hypertension: Secondary | ICD-10-CM

## 2016-08-04 DIAGNOSIS — I493 Ventricular premature depolarization: Secondary | ICD-10-CM

## 2016-08-04 DIAGNOSIS — I4729 Other ventricular tachycardia: Secondary | ICD-10-CM

## 2016-08-04 NOTE — Patient Instructions (Addendum)
Medication Instructions:  Your physician recommends that you continue on your current medications as directed. Please refer to the Current Medication list given to you today.   Labwork: None Ordered   Testing/Procedures: None Ordered   Follow-Up: Follow-up with Dr. Taylor as needed    Any Other Special Instructions Will Be Listed Below (If Applicable).     If you need a refill on your cardiac medications before your next appointment, please call your pharmacy.   

## 2016-08-04 NOTE — Progress Notes (Signed)
HPI [name] is referred today by Alex Copa, PA-C for evaluation of PAC's and PVC's. He is a pleasant and active 76 yo man with a h/o PAC's, PVC's, NSVT, and mild LV dysfunction in the absence of CAD. He has syncope remotely, several years ago. He has undergone extensive evaluation and is known to have moderate MR, and a holter monitor demonstrated 24000 polymorphic PVC's in 24 hours. The patient does not have palpitations. He remains active.  Allergies  Allergen Reactions  . Other Other (See Comments)    /mangos/caused a severe rash  . Prozac [Fluoxetine Hcl]     Made him feel "loopy"  . Sulfa Antibiotics Hives  . Vicodin [Hydrocodone-Acetaminophen] Itching    Tolerates oxycodone  . Erythromycin Rash    Many years ago an oral antibiotic caused a rash(wife and patient think it was erythromycin but they are not positive).     Current Outpatient Prescriptions  Medication Sig Dispense Refill  . aspirin EC 81 MG tablet Take 1 tablet (81 mg total) by mouth daily. 90 tablet 3  . atorvastatin (LIPITOR) 10 MG tablet Take 1 tablet (10 mg total) by mouth daily. 90 tablet 3  . BEE POLLEN PO Take by mouth daily.    Marland Kitchen Bioflavonoid Products (BIOFLEX PO) Take 2 tablets by mouth daily.     . carvedilol (COREG) 3.125 MG tablet Take 1 tablet (3.125 mg total) by mouth 2 (two) times daily. 30 tablet 0  . Cranberry 1000 MG CAPS Take 1,000 mg by mouth daily.     Marland Kitchen ibuprofen (ADVIL,MOTRIN) 200 MG tablet Take 600 mg by mouth 2 (two) times daily. Take 3 pills in am and 3 pills at bedtime    . lisinopril (PRINIVIL,ZESTRIL) 5 MG tablet TAKE 1 TABLET (5 MG TOTAL) BY MOUTH DAILY. 90 tablet 3  . Multiple Vitamin (MULTIVITAMIN) tablet Take 1 tablet by mouth daily.    . Na Sulfate-K Sulfate-Mg Sulf (SUPREP BOWEL PREP) SOLN Suprep as directed / no substitutions 354 mL 0   No current facility-administered medications for this visit.      Past Medical History:  Diagnosis Date  . Arthritis    HANDS AND  PT STATES HIS ARMS HURT  . Brain tumor [facility])    age 37 - brain tumor removed left- benign - occas slight headaches since the surgery  . Colonic mass 2001  . Fibromyalgia 1990's   . Frequent PVCs   . H/O inguinal hernia    right side  . Headache(784.0)   . Hernia, umbilical   . History of kidney stones    HX OF MULTIPLE KIDNEY STONES AND  CURRENTLY HAS BILATERAL RENAL STONES CAUSING ABDOMINAL AND BACK PAIN  . Hypertension    high blood pressure readings   . NICM (nonischemic cardiomyopathy) ([place])   . NSVT (nonsustained ventricular tachycardia) ([place])   . Premature atrial contractions   . Prolonged Q-T interval on ECG     ROS:   All systems reviewed and negative except as noted in the HPI.   Past Surgical History:  Procedure Laterality Date  . BRAIN TUMOR EXCISION    . CARDIAC CATHETERIZATION N/A 04/09/2015   Procedure: Left Heart Cath and Coronary Angiography;  Surgeon: Alex Blanks, MD;  Location: [facility];  Service: Cardiovascular;  Laterality: N/A;  . COLON SURGERY  2001   COLON RESECTION FOR GROWTH - NOT CANCER  . CYSTOSCOPY WITH RETROGRADE PYELOGRAM, URETEROSCOPY AND STENT PLACEMENT Left 10/05/2012  Procedure: CYSTOSCOPY WITH RETROGRADE PYELOGRAM, URETEROSCOPY AND STENT PLACEMENT;  Surgeon: Alex Frock, MD;  [facility];  Service: Urology;  Laterality: Left;  . CYSTOSCOPY/RETROGRADE/URETEROSCOPY/STONE EXTRACTION WITH BASKET Right 10/05/2012   Procedure: CYSTOSCOPY/RETROGRADE/URETEROSCOPY/STONE EXTRACTION WITH BASKET;  Surgeon: Alex Frock, MD;  [facility];  Service: Urology;  Laterality: Right;  . CYSTOSCOPY/RETROGRADE/URETEROSCOPY/STONE EXTRACTION WITH BASKET Bilateral 10/24/2012   Procedure: CYSTOSCOPY/RETROGRADE/URETEROSCOPY/STONE EXTRACTION WITH BASKET;  Surgeon: Alex Frock, MD;  [facility];  Service: Urology;  Laterality: Bilateral;  with bilateral stent placement  . HOLMIUM LASER APPLICATION Right 03/29/2540   Procedure:  HOLMIUM LASER APPLICATION;  Surgeon: Alex Frock, MD;  [facility];  Service: Urology;  Laterality: Right;  . HOLMIUM LASER APPLICATION Bilateral 08/13/2374   Procedure: HOLMIUM LASER APPLICATION;  Surgeon: Alex Frock, MD;  [facility];  Service: Urology;  Laterality: Bilateral;  . LITHOTRIPSY    . TONSILLECTOMY       Family History  Problem Relation Age of Onset  . Colon cancer Father   . Heart disease Father        quad bypass     Social History   Social History  . Marital status: Single    Spouse name: N/A  . Number of children: N/A  . Years of education: N/A   Occupational History  . Not on file.   Social History Main Topics  . Smoking status: Never Smoker  . Smokeless tobacco: Never Used  . Alcohol use 4.2 oz/week    7 Standard drinks or equivalent per week     Comment: OCCAS DRINK - BUT NOT DAILY  . Drug use: No  . Sexual activity: Not on file   Other Topics Concern  . Not on file   Social History Narrative   Retired from Museum/gallery curator    Has one son - lives local    Likes to play Tennis     BP (!) 154/98   Pulse (!) 58   Ht 6' (1.829 m)   Wt 176 lb 6.4 oz (80 kg)   SpO2 97%   BMI 23.92 kg/m   Physical Exam:  Well appearing 75 yo man, NAD HEENT: Unremarkable Neck:  6 cm JVD, no thyromegally Lymphatics:  No adenopathy Back:  No CVA tenderness Lungs: clear with no wheezes HEART:  Regular rate rhythm, no murmurs, no rubs, no clicks Abd:  soft, positive bowel sounds, no organomegally, no rebound, no guarding Ext:  2 plus pulses, no edema, no cyanosis, no clubbing Skin:  No rashes no nodules Neuro:  CN II through XII intact, motor grossly intact  EKG - sinus bradycardia  Assess/Plan: 1. Atrial and ventricular ectopy - the patient is completely asymptomatic. We discussed the density of his ectopy. I have recommended her undergo watchful waiting. No indication for AA drug therapy. Having multiple PVC morphologies makes catheter  ablation less likely to be successful.  2. LV dysfunction - his symptoms are class 1. He is on good medical therapy.  3. HTN - his blood pressure is elevated today but he did not take his meds this morning.  4.

## 2016-08-07 ENCOUNTER — Other Ambulatory Visit: Payer: Self-pay | Admitting: Physician Assistant

## 2016-08-07 DIAGNOSIS — I10 Essential (primary) hypertension: Secondary | ICD-10-CM

## 2016-08-07 DIAGNOSIS — I493 Ventricular premature depolarization: Secondary | ICD-10-CM

## 2016-08-12 NOTE — Progress Notes (Signed)
Pre visit review using our clinic review tool, if applicable. No additional management support is needed unless otherwise documented below in the visit note. 

## 2016-08-12 NOTE — Progress Notes (Signed)
Subjective:   [name] is a 76 y.o. male who presents for an Initial Medicare Annual Wellness Visit.  Review of Systems  No ROS.  Medicare Wellness Visit. Additional risk factors are reflected in the social history.  Cardiac Risk Factors include: advanced age (>71men, >65 women);hypertension   Sleep patterns: 7 hrs/night. Feels tired upon waking. Does not get up during the night.   Home Safety/Smoke Alarms: Feels safe in home. Smoke alarms in place.  Living environment; residence and Firearm Safety: One story home. Works full time still. Seat Belt Safety/Bike Helmet: Wears seat belt.   Counseling:   Dental- Upper and lower dentures.  Male:   CCS-No record. Pt is waiting for cardiac clearance before he can have colonoscopy. States he has already seen [facility] for visit. PSA- No results found for: PSA     Objective:    Today's Vitals   08/19/16 1058  BP: 130/80  Pulse: 60  Resp: 16  SpO2: 94%  Weight: 175 lb 11.2 oz (79.7 kg)  Height: 6' (1.829 m)   Body mass index is 23.83 kg/m.  Current Medications (verified) Outpatient Encounter Prescriptions as of 08/19/2016  Medication Sig  . aspirin EC 81 MG tablet Take 1 tablet (81 mg total) by mouth daily.  Marland Kitchen atorvastatin (LIPITOR) 10 MG tablet Take 1 tablet (10 mg total) by mouth daily.  Marland Kitchen BEE POLLEN PO Take by mouth daily.  Marland Kitchen Bioflavonoid Products (BIOFLEX PO) Take 2 tablets by mouth daily.   . carvedilol (COREG) 3.125 MG tablet TAKE 1 TABLET (3.125 MG TOTAL) BY MOUTH 2 (TWO) TIMES DAILY.  Marland Kitchen Cranberry 1000 MG CAPS Take 1,000 mg by mouth daily.   Marland Kitchen ibuprofen (ADVIL,MOTRIN) 200 MG tablet Take 600 mg by mouth 2 (two) times daily. Take 3 pills in am and 3 pills at bedtime  . lisinopril (PRINIVIL,ZESTRIL) 5 MG tablet TAKE 1 TABLET (5 MG TOTAL) BY MOUTH DAILY.  . Multiple Vitamin (MULTIVITAMIN) tablet Take 1 tablet by mouth daily.  . [DISCONTINUED] Na Sulfate-K Sulfate-Mg Sulf ([place]) SOLN Suprep as  directed / no substitutions   No facility-administered encounter medications on file as of 08/19/2016.     Allergies (verified) Other; Prozac [fluoxetine hcl]; Sulfa antibiotics; Vicodin [hydrocodone-acetaminophen]; and Erythromycin   History: Past Medical History:  Diagnosis Date  . Arthritis    HANDS AND PT STATES HIS ARMS HURT  . 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 Colonic mass 2001  . Fibromyalgia 1990's   . Frequent PVCs   . H/O inguinal hernia    right side  . Headache(784.0)   . Hernia, umbilical   . History of kidney stones    HX OF MULTIPLE KIDNEY STONES AND  CURRENTLY HAS BILATERAL RENAL STONES CAUSING ABDOMINAL AND BACK PAIN  . Hypertension    high blood pressure readings   . NICM (nonischemic cardiomyopathy) ([place])   . NSVT (nonsustained ventricular tachycardia) ([place])   . Premature atrial contractions   . Prolonged Q-T interval on ECG    Past Surgical History:  Procedure Laterality Date  . BRAIN TUMOR EXCISION    . CARDIAC CATHETERIZATION N/A 04/09/2015   Procedure: Left Heart Cath and Coronary Angiography;  Surgeon: Burnell Blanks, MD;  Location: [facility];  Service: Cardiovascular;  Laterality: N/A;  . COLON SURGERY  2001   COLON RESECTION FOR GROWTH - NOT CANCER  .  CYSTOSCOPY WITH RETROGRADE PYELOGRAM, URETEROSCOPY AND STENT PLACEMENT Left 10/05/2012   Procedure: CYSTOSCOPY WITH RETROGRADE PYELOGRAM, URETEROSCOPY AND STENT PLACEMENT;  Surgeon: Alexis Frock, MD;  Location: [facility];  Service: Urology;  Laterality: Left;  . CYSTOSCOPY/RETROGRADE/URETEROSCOPY/STONE EXTRACTION WITH BASKET Right 10/05/2012   Procedure: CYSTOSCOPY/RETROGRADE/URETEROSCOPY/STONE EXTRACTION WITH BASKET;  Surgeon: Alexis Frock, MD;  Location: [facility];  Service: Urology;  Laterality: Right;  . CYSTOSCOPY/RETROGRADE/URETEROSCOPY/STONE EXTRACTION WITH BASKET Bilateral 10/24/2012   Procedure:  CYSTOSCOPY/RETROGRADE/URETEROSCOPY/STONE EXTRACTION WITH BASKET;  Surgeon: Alexis Frock, MD;  Location: [facility];  Service: Urology;  Laterality: Bilateral;  with bilateral stent placement  . HOLMIUM LASER APPLICATION Right 08/22/9676   Procedure: HOLMIUM LASER APPLICATION;  Surgeon: Alexis Frock, MD;  Location: [facility];  Service: Urology;  Laterality: Right;  . HOLMIUM LASER APPLICATION Bilateral 9/38/1017   Procedure: HOLMIUM LASER APPLICATION;  Surgeon: Alexis Frock, MD;  Location: [facility];  Service: Urology;  Laterality: Bilateral;  . LITHOTRIPSY    . TONSILLECTOMY     Family History  Problem Relation Age of Onset  . Colon cancer Father   . Heart disease Father        quad bypass   Social History   Occupational History  . Not on file.   Social History Main Topics  . Smoking status: Never Smoker  . Smokeless tobacco: Never Used  . Alcohol use 4.2 oz/week    7 Standard drinks or equivalent per week     Comment: OCCAS DRINK - BUT NOT DAILY  . Drug use: No  . Sexual activity: Not on file   Tobacco Counseling Counseling given: Not Answered   Activities of Daily Living In your present state of health, do you have any difficulty performing the following activities: 08/19/2016  Hearing? N  Vision? N  Difficulty concentrating or making decisions? N  Walking or climbing stairs? N  Dressing or bathing? N  Doing errands, shopping? N  Preparing Food and eating ? N  Using the Toilet? N  In the past six months, have you accidently leaked urine? N  Do you have problems with loss of bowel control? N  Managing your Medications? N  Managing your Finances? N  Housekeeping or managing your Housekeeping? N  Some recent data might be hidden    Immunizations and Health Maintenance Immunization History  Administered Date(s) Administered  . Influenza, High Dose Seasonal PF 02/03/2015  . Pneumococcal Conjugate-13 02/03/2015  . Pneumococcal Polysaccharide-23 08/19/2016   Health  Maintenance Due  Topic Date Due  . TETANUS/TDAP  08/14/1960  . COLONOSCOPY  08/15/1991    Patient Care Team: Dorothyann Peng, NP as PCP - General (Family Medicine)  Indicate any recent Medical Services you may have received from other than [facility] providers in the past year (date may be approximate).    Assessment:   This is a routine wellness examination for [place]. Physical assessment deferred to PCP.   Hearing/Vision screen Hearing Screening Comments: Able to hear conversational tones w/o difficulty. No issues reported.   Vision Screening Comments: Wears reading glasses. Overdue for eye exam, states that he will schedule an exam.  Dietary issues and exercise activities discussed: Current Exercise Habits: Home exercise routine, Type of exercise: Other - see comments (Plays tennis), Time (Minutes): > 60, Frequency (Times/Week): 3, Weekly Exercise (Minutes/Week): 0, Intensity: Intense, Exercise limited by: None identified   Diet (meal preparation, eat out, water intake, caffeinated beverages, dairy products, fruits and vegetables): 3 meals/day. 1 cup coffee. 4 glasses water/day. Increases water intake when playing tennis.  1 Coke per/day. Breakfast: Egg Lunch: Sandwich with [place]. Dinner:  Chicken or fish.   Goals    None     Depression Screen PHQ 2/9 Scores 08/19/2016 02/03/2015  PHQ - 2 Score 0 0    Fall Risk Fall Risk  08/19/2016 02/03/2015  Falls in the past year? No No    Cognitive Function:   Ad8 score reviewed for issues:  Issues making decisions:no  Less interest in hobbies / activities:no  Repeats questions, stories (family complaining):no  Trouble using ordinary gadgets (microwave, computer, phone):no  Forgets the month or year: no  Mismanaging finances: no  Remembering appts:no  Daily problems with thinking and/or memory:no Ad8 score is=0      Screening Tests Health Maintenance  Topic Date Due  . TETANUS/TDAP  08/14/1960  . COLONOSCOPY   08/15/1991  . INFLUENZA VACCINE  09/07/2016  . PNA vac Low Risk Adult  Completed        Plan:   Pt awaiting cardiac clearance before scheduling colonoscopy, has seen [facility] as consult.  Pt received PPSV23 today.  Educated regarding TDAP guidelines and to let office know if he needs it.  Bring a copy of your advance directives to your next office visit.  I have personally reviewed and noted the following in the patient's chart:   . Medical and social history . Use of alcohol, tobacco or illicit drugs  . Current medications and supplements . Functional ability and status . Nutritional status . Physical activity . Advanced directives . List of other physicians . Vitals . Screenings to include cognitive, depression, and falls . Referrals and appointments  In addition, I have reviewed and discussed with patient certain preventive protocols, quality metrics, and best practice recommendations. A written personalized care plan for preventive services as well as general preventive health recommendations were provided to patient.     Ree Edman, RN   08/19/2016

## 2016-08-19 ENCOUNTER — Ambulatory Visit (INDEPENDENT_AMBULATORY_CARE_PROVIDER_SITE_OTHER): Payer: Medicare Other

## 2016-08-19 VITALS — BP 130/80 | HR 60 | Resp 16 | Ht 72.0 in | Wt 175.7 lb

## 2016-08-19 DIAGNOSIS — Z23 Encounter for immunization: Secondary | ICD-10-CM

## 2016-08-19 DIAGNOSIS — Z0000 Encounter for general adult medical examination without abnormal findings: Secondary | ICD-10-CM | POA: Diagnosis not present

## 2016-08-19 NOTE — Patient Instructions (Signed)
Bring a copy of your advance directives to your next office visit.   Preventive Care 75 Years and Older, Male Preventive care refers to lifestyle choices and visits with your health care provider that can promote health and wellness. What does preventive care include?  A yearly physical exam. This is also called an annual well check.  Dental exams once or twice a year.  Routine eye exams. Ask your health care provider how often you should have your eyes checked.  Personal lifestyle choices, including:  Daily care of your teeth and gums.  Regular physical activity.  Eating a healthy diet.  Avoiding tobacco and drug use.  Limiting alcohol use.  Practicing safe sex.  Taking low doses of aspirin every day.  Taking vitamin and mineral supplements as recommended by your health care provider. What happens during an annual well check? The services and screenings done by your health care provider during your annual well check will depend on your age, overall health, lifestyle risk factors, and family history of disease. Counseling  Your health care provider may ask you questions about your:  Alcohol use.  Tobacco use.  Drug use.  Emotional well-being.  Home and relationship well-being.  Sexual activity.  Eating habits.  History of falls.  Memory and ability to understand (cognition).  Work and work environment. Screening  You may have the following tests or measurements:  Height, weight, and BMI.  Blood pressure.  Lipid and cholesterol levels. These may be checked every 5 years, or more frequently if you are over 50 years old.  Skin check.  Lung cancer screening. You may have this screening every year starting at age 55 if you have a 30-pack-year history of smoking and currently smoke or have quit within the past 15 years.  Fecal occult blood test (FOBT) of the stool. You may have this test every year starting at age 50.  Flexible sigmoidoscopy or  colonoscopy. You may have a sigmoidoscopy every 5 years or a colonoscopy every 10 years starting at age 50.  Prostate cancer screening. Recommendations will vary depending on your family history and other risks.  Hepatitis C blood test.  Hepatitis B blood test.  Sexually transmitted disease (STD) testing.  Diabetes screening. This is done by checking your blood sugar (glucose) after you have not eaten for a while (fasting). You may have this done every 1-3 years.  Abdominal aortic aneurysm (AAA) screening. You may need this if you are a current or former smoker.  Osteoporosis. You may be screened starting at age 70 if you are at high risk. Talk with your health care provider about your test results, treatment options, and if necessary, the need for more tests. Vaccines  Your health care provider may recommend certain vaccines, such as:  Influenza vaccine. This is recommended every year.  Tetanus, diphtheria, and acellular pertussis (Tdap, Td) vaccine. You may need a Td booster every 10 years.  Varicella vaccine. You may need this if you have not been vaccinated.  Zoster vaccine. You may need this after age 60.  Measles, mumps, and rubella (MMR) vaccine. You may need at least one dose of MMR if you were born in 1957 or later. You may also need a second dose.  Pneumococcal 13-valent conjugate (PCV13) vaccine. One dose is recommended after age 65.  Pneumococcal polysaccharide (PPSV23) vaccine. One dose is recommended after age 65.  Meningococcal vaccine. You may need this if you have certain conditions.  Hepatitis A vaccine. You may need this   if you have certain conditions or if you travel or work in places where you may be exposed to hepatitis A.  Hepatitis B vaccine. You may need this if you have certain conditions or if you travel or work in places where you may be exposed to hepatitis B.  Haemophilus influenzae type b (Hib) vaccine. You may need this if you have certain risk  factors. Talk to your health care provider about which screenings and vaccines you need and how often you need them. This information is not intended to replace advice given to you by your health care provider. Make sure you discuss any questions you have with your health care provider. Document Released: 02/20/2015 Document Revised: 10/14/2015 Document Reviewed: 11/25/2014 Elsevier Interactive Patient Education  2017 Elsevier Inc.  

## 2016-08-19 NOTE — Progress Notes (Signed)
I have reviewed and agree with this plan  

## 2016-08-24 DIAGNOSIS — H903 Sensorineural hearing loss, bilateral: Secondary | ICD-10-CM | POA: Insufficient documentation

## 2016-08-24 DIAGNOSIS — H9313 Tinnitus, bilateral: Secondary | ICD-10-CM | POA: Diagnosis not present

## 2016-10-03 ENCOUNTER — Other Ambulatory Visit: Payer: Self-pay | Admitting: Adult Health

## 2016-11-04 ENCOUNTER — Other Ambulatory Visit: Payer: Self-pay | Admitting: Adult Health

## 2016-11-04 MED ORDER — LISINOPRIL 5 MG PO TABS: ORAL_TABLET | ORAL | 0 refills | Status: DC

## 2016-11-28 ENCOUNTER — Other Ambulatory Visit: Payer: Self-pay | Admitting: Dermatology

## 2016-11-28 DIAGNOSIS — C44319 Basal cell carcinoma of skin of other parts of face: Secondary | ICD-10-CM | POA: Diagnosis not present

## 2016-11-28 DIAGNOSIS — L821 Other seborrheic keratosis: Secondary | ICD-10-CM | POA: Diagnosis not present

## 2016-11-28 DIAGNOSIS — D229 Melanocytic nevi, unspecified: Secondary | ICD-10-CM | POA: Diagnosis not present

## 2016-11-28 DIAGNOSIS — C44311 Basal cell carcinoma of skin of nose: Secondary | ICD-10-CM | POA: Diagnosis not present

## 2016-11-28 DIAGNOSIS — C441191 Basal cell carcinoma of skin of left upper eyelid, including canthus: Secondary | ICD-10-CM | POA: Diagnosis not present

## 2016-12-16 ENCOUNTER — Other Ambulatory Visit: Payer: Self-pay | Admitting: Adult Health

## 2016-12-16 NOTE — Telephone Encounter (Signed)
Denied.  Pt needs an appt with Tommi Rumps for refills of this medication.

## 2016-12-21 ENCOUNTER — Other Ambulatory Visit: Payer: Self-pay | Admitting: Adult Health

## 2016-12-21 NOTE — Telephone Encounter (Signed)
Rx denied.  Pt is past due for cpx and fasting lab work.  Needs an appointment.

## 2016-12-26 ENCOUNTER — Other Ambulatory Visit: Payer: Self-pay | Admitting: Adult Health

## 2016-12-27 NOTE — Telephone Encounter (Signed)
Denied.  Pt needs appointment for further refills of this medication.  Message sent to the pharmacy.

## 2017-01-07 ENCOUNTER — Other Ambulatory Visit: Payer: Self-pay | Admitting: Adult Health

## 2017-01-10 NOTE — Telephone Encounter (Signed)
DENIED.  PT NEEDS AN APPT FOR FURTHER REFILLS OF MEDICATION.

## 2017-02-23 DIAGNOSIS — C44311 Basal cell carcinoma of skin of nose: Secondary | ICD-10-CM | POA: Diagnosis not present

## 2017-03-14 DIAGNOSIS — C44321 Squamous cell carcinoma of skin of nose: Secondary | ICD-10-CM | POA: Diagnosis not present

## 2017-03-14 DIAGNOSIS — C44311 Basal cell carcinoma of skin of nose: Secondary | ICD-10-CM | POA: Diagnosis not present

## 2017-03-14 DIAGNOSIS — D485 Neoplasm of uncertain behavior of skin: Secondary | ICD-10-CM | POA: Diagnosis not present

## 2017-04-13 DIAGNOSIS — D485 Neoplasm of uncertain behavior of skin: Secondary | ICD-10-CM | POA: Diagnosis not present

## 2017-04-13 DIAGNOSIS — L570 Actinic keratosis: Secondary | ICD-10-CM | POA: Diagnosis not present

## 2017-04-13 DIAGNOSIS — D0439 Carcinoma in situ of skin of other parts of face: Secondary | ICD-10-CM | POA: Diagnosis not present

## 2017-05-02 DIAGNOSIS — C44329 Squamous cell carcinoma of skin of other parts of face: Secondary | ICD-10-CM | POA: Diagnosis not present

## 2017-06-03 ENCOUNTER — Other Ambulatory Visit: Payer: Self-pay | Admitting: Physician Assistant

## 2017-06-03 DIAGNOSIS — I10 Essential (primary) hypertension: Secondary | ICD-10-CM

## 2017-06-03 DIAGNOSIS — I493 Ventricular premature depolarization: Secondary | ICD-10-CM

## 2017-06-05 NOTE — Telephone Encounter (Signed)
Outpatient Medication Detail    Disp Refills Start End   carvedilol (COREG) 3.125 MG tablet 60 tablet 11 08/08/2016    Sig - Route: TAKE 1 TABLET (3.125 MG TOTAL) BY MOUTH 2 (TWO) TIMES DAILY. - Oral   Sent to pharmacy as: carvedilol (COREG) 3.125 MG tablet   E-Prescribing Status: Receipt confirmed by pharmacy (08/08/2016 9:46 AM EDT)   Associated Diagnoses   Frequent PVCs     Essential hypertension     Pharmacy   [id number] - [place], [place] - [address]

## 2017-06-06 DIAGNOSIS — L570 Actinic keratosis: Secondary | ICD-10-CM | POA: Diagnosis not present

## 2017-07-24 ENCOUNTER — Other Ambulatory Visit: Payer: Self-pay | Admitting: Physician Assistant

## 2017-08-15 ENCOUNTER — Encounter (HOSPITAL_COMMUNITY): Payer: Self-pay | Admitting: Emergency Medicine

## 2017-08-15 ENCOUNTER — Emergency Department (HOSPITAL_COMMUNITY): Payer: Medicare Other

## 2017-08-15 ENCOUNTER — Emergency Department (HOSPITAL_COMMUNITY)
Admission: EM | Admit: 2017-08-15 | Discharge: 2017-08-15 | Disposition: A | Discharge status: Home / Self Care | Payer: Medicare Other | Attending: Emergency Medicine | Admitting: Emergency Medicine

## 2017-08-15 DIAGNOSIS — Z7982 Long term (current) use of aspirin: Secondary | ICD-10-CM | POA: Insufficient documentation

## 2017-08-15 DIAGNOSIS — Z79899 Other long term (current) drug therapy: Secondary | ICD-10-CM | POA: Diagnosis not present

## 2017-08-15 DIAGNOSIS — I10 Essential (primary) hypertension: Secondary | ICD-10-CM | POA: Insufficient documentation

## 2017-08-15 DIAGNOSIS — R1032 Left lower quadrant pain: Secondary | ICD-10-CM | POA: Diagnosis not present

## 2017-08-15 DIAGNOSIS — N200 Calculus of kidney: Secondary | ICD-10-CM

## 2017-08-15 DIAGNOSIS — R531 Weakness: Secondary | ICD-10-CM | POA: Insufficient documentation

## 2017-08-15 LAB — CBC
HCT: 45.8 % (ref 39.0–52.0)
Hemoglobin: 15.7 g/dL (ref 13.0–17.0)
MCH: 33.3 pg (ref 26.0–34.0)
MCHC: 34.3 g/dL (ref 30.0–36.0)
MCV: 97 fL (ref 78.0–100.0)
Platelets: 359 10*3/uL (ref 150–400)
RBC: 4.72 MIL/uL (ref 4.22–5.81)
RDW: 13.3 % (ref 11.5–15.5)
WBC: 5.6 10*3/uL (ref 4.0–10.5)

## 2017-08-15 LAB — COMPREHENSIVE METABOLIC PANEL
ALT: 25 U/L (ref 0–44)
AST: 35 U/L (ref 15–41)
Albumin: 4.3 g/dL (ref 3.5–5.0)
Alkaline Phosphatase: 92 U/L (ref 38–126)
Anion gap: 9 (ref 5–15)
BUN: 23 mg/dL (ref 8–23)
CO2: 28 mmol/L (ref 22–32)
Calcium: 9.8 mg/dL (ref 8.9–10.3)
Chloride: 102 mmol/L (ref 98–111)
Creatinine, Ser: 1 mg/dL (ref 0.61–1.24)
GFR calc Af Amer: >60 mL/min (ref >60)
GFR calc non Af Amer: >60 mL/min (ref >60)
Glucose, Bld: 108 mg/dL — ABNORMAL HIGH (ref 70–99)
Potassium: 4.4 mmol/L (ref 3.5–5.1)
Sodium: 139 mmol/L (ref 135–145)
Total Bilirubin: 0.9 mg/dL (ref 0.3–1.2)
Total Protein: 7.5 g/dL (ref 6.5–8.1)

## 2017-08-15 LAB — URINALYSIS, ROUTINE W REFLEX MICROSCOPIC
Bilirubin Urine: NEGATIVE
Glucose, UA: NEGATIVE mg/dL
Hgb urine dipstick: NEGATIVE
Ketones, ur: NEGATIVE mg/dL
Leukocytes, UA: NEGATIVE
Nitrite: NEGATIVE
Protein, ur: NEGATIVE mg/dL
Specific Gravity, Urine: 1.009 (ref 1.005–1.030)
pH: 7 (ref 5.0–8.0)

## 2017-08-15 LAB — LIPASE, BLOOD: Lipase: 36 U/L (ref 11–51)

## 2017-08-15 MED ORDER — OXYCODONE-ACETAMINOPHEN 5-325 MG PO TABS: 1.0000 | ORAL_TABLET | Freq: Four times a day (QID) | ORAL | 0 refills | Status: DC | PRN

## 2017-08-15 MED ORDER — LACTATED RINGERS IV BOLUS
1000.0000 mL | Freq: Once | INTRAVENOUS | Status: AC
  Administered 2017-08-15: 1000 mL via INTRAVENOUS

## 2017-08-15 MED ORDER — IOPAMIDOL (ISOVUE-300) INJECTION 61%
100.0000 mL | Freq: Once | INTRAVENOUS | Status: AC | PRN
  Administered 2017-08-15: 100 mL via INTRAVENOUS

## 2017-08-15 MED ORDER — IOPAMIDOL (ISOVUE-300) INJECTION 61%
INTRAVENOUS | Status: AC
  Filled 2017-08-15: qty 100

## 2017-08-15 NOTE — ED Notes (Signed)
Patient aware need urine sample.

## 2017-08-15 NOTE — ED Notes (Signed)
Patient ambulated to bathroom.

## 2017-08-15 NOTE — ED Notes (Signed)
Patient denies n/v or diarrhea.

## 2017-08-15 NOTE — ED Notes (Signed)
Patient ambulated to the bathroom with no assistance. 

## 2017-08-15 NOTE — ED Notes (Signed)
Patient transported to CT 

## 2017-08-15 NOTE — ED Provider Notes (Signed)
[facility] DEPT Provider Note   CSN: [id number] Arrival date & time: 08/15/17  4098     History   Chief Complaint Chief Complaint  Patient presents with  . Dizziness  . Abdominal Pain    HPI [name] is a 77 y.o. male.  HPI   77 year old male with several week history of generalized weakness.  He states that he feels very fatigued and does not have much energy.  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  No fevers.  Some mild nausea.  No vomiting or diarrhea.  The past few days she is developed some pain in his left lower quadrant.  He describes it as a mild dull ache.  No diarrhea or blood in his stool.  No acute urinary complaints.  Past Medical History:  Diagnosis Date  . Arthritis    HANDS AND PT STATES HIS ARMS HURT  . 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 Colonic mass 2001  . Fibromyalgia 1990's   . Frequent PVCs   . H/O inguinal hernia    right side  . Headache(784.0)   . Hernia, umbilical   . History of kidney stones    HX OF MULTIPLE KIDNEY STONES AND  CURRENTLY HAS BILATERAL RENAL STONES CAUSING ABDOMINAL AND BACK PAIN  . Hypertension    high blood pressure readings   . NICM (nonischemic cardiomyopathy) ([place])   . NSVT (nonsustained ventricular tachycardia) ([place])   . Premature atrial contractions   . Prolonged Q-T interval on ECG     Patient Active Problem List   Diagnosis Date Noted  . Frequent PVCs   . NSVT (nonsustained ventricular tachycardia) ([place])   . Premature atrial contractions   . Systolic dysfunction   . Essential hypertension 02/03/2015  . 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 BRAIN TUMOR EXCISION    . CARDIAC CATHETERIZATION N/A 04/09/2015   Procedure: Left Heart Cath and Coronary Angiography;  Surgeon:  Burnell Blanks, MD;  Location: [place] [facility];  Service: Cardiovascular;  Laterality: N/A;  . COLON SURGERY  2001   COLON RESECTION FOR GROWTH - NOT CANCER  . CYSTOSCOPY WITH RETROGRADE PYELOGRAM, URETEROSCOPY AND STENT PLACEMENT Left 10/05/2012   Procedure: CYSTOSCOPY WITH RETROGRADE PYELOGRAM, URETEROSCOPY AND STENT PLACEMENT;  Surgeon: Alexis Frock, MD;  Location: [facility];  Service: Urology;  Laterality: Left;  . CYSTOSCOPY/RETROGRADE/URETEROSCOPY/STONE EXTRACTION WITH BASKET Right 10/05/2012   Procedure: CYSTOSCOPY/RETROGRADE/URETEROSCOPY/STONE EXTRACTION WITH BASKET;  Surgeon: Alexis Frock, MD;  Location: [facility];  Service: Urology;  Laterality: Right;  . CYSTOSCOPY/RETROGRADE/URETEROSCOPY/STONE EXTRACTION WITH BASKET Bilateral 10/24/2012   Procedure: CYSTOSCOPY/RETROGRADE/URETEROSCOPY/STONE EXTRACTION WITH BASKET;  Surgeon: Alexis Frock, MD;  Location[facility];  Service: Urology;  Laterality: Bilateral;  with bilateral stent placement  . HOLMIUM LASER APPLICATION Right 02/26/1476   Procedure: HOLMIUM LASER APPLICATION;  Surgeon: Alexis Frock, MD;  Location: [facility];  Service: Urology;  Laterality: Right;  . HOLMIUM LASER APPLICATION Bilateral 2/95/6213   Procedure: HOLMIUM LASER APPLICATION;  Surgeon: Alexis Frock, MD;  Location: [facility];  Service: Urology;  Laterality: Bilateral;  . LITHOTRIPSY    . TONSILLECTOMY          Home Medications    Prior to Admission medications   Medication Sig Start Date End Date Taking?  Authorizing Provider  aspirin EC 81 MG tablet Take 1 tablet (81 mg total) by mouth daily. 04/02/15  Yes Dorothy Spark, MD  Bioflavonoid Products (BIOFLEX PO) Take 2 tablets by mouth daily.    Yes [provider]  Cranberry 1000 MG CAPS Take 1,000 mg by mouth daily.    Yes [provider]  ibuprofen (ADVIL,MOTRIN) 200 MG tablet Take 600 mg by mouth 2 (two) times daily. Take 3 pills in am and 3 pills at bedtime   Yes [provider]  Multiple Vitamin (MULTIVITAMIN) tablet Take 1 tablet by mouth daily.   Yes [provider]  atorvastatin (LIPITOR) 10 MG tablet TAKE 1 TABLET BY MOUTH EVERY DAY Patient not taking: Reported on 08/15/2017 07/24/17   Evans Lance, MD  carvedilol (COREG) 3.125 MG tablet TAKE 1 TABLET (3.125 MG TOTAL) BY MOUTH 2 (TWO) TIMES DAILY. Patient not taking: Reported on 08/15/2017 08/08/16   Charlie Pitter, PA-C  lisinopril (PRINIVIL,ZESTRIL) 5 MG tablet TAKE 1 TABLET (5 MG TOTAL) BY MOUTH DAILY. Patient not taking: Reported on 08/15/2017 11/04/16   Dorothyann Peng, NP    Family History Family History  Problem Relation Age of Onset  . Colon cancer Father   . Heart disease Father        quad bypass    Social History Social History   Tobacco Use  . Smoking status: Never Smoker  . Smokeless tobacco: Never Used  Substance Use Topics  . Alcohol use: Yes    Alcohol/week: 4.2 oz    Types: 7 Standard drinks or equivalent per week  . Drug use: No     Allergies   Other; Prozac [fluoxetine hcl]; Sulfa antibiotics; Vicodin [hydrocodone-acetaminophen]; and Erythromycin   Review of Systems Review of Systems  All systems reviewed and negative, other than as noted in HPI.  Physical Exam Updated Vital Signs BP (!) 166/102   Pulse (!) 52   Temp (!) 97.4 F (36.3 C) (Oral)   Resp 18   Ht 6' (1.829 m)   Wt 79.4 kg (175 lb)   SpO2 97%   BMI 23.73 kg/m   Physical Exam  Constitutional: He is oriented to person, place, and time. He appears well-developed and well-nourished. No distress.  HENT:  Head: Normocephalic and atraumatic.  Eyes: Conjunctivae are normal. Right eye exhibits no discharge. Left eye exhibits no discharge.  Neck: Neck supple.  Cardiovascular: Normal rate, regular rhythm and normal heart sounds. Exam reveals no gallop and no friction rub.  No murmur heard. Pulmonary/Chest: Effort normal and breath sounds normal. No respiratory distress.  Abdominal: Soft. He exhibits  no distension. There is no tenderness.  Mild suprapubic to left lower quadrant tenderness without rebound or guarding.  No distention.  Musculoskeletal: He exhibits no edema or tenderness.  Neurological: He is alert and oriented to person, place, and time. No cranial nerve deficit. He exhibits normal muscle tone. Coordination normal.  Skin: Skin is warm and dry.  Psychiatric: He has a normal mood and affect. His behavior is normal. Thought content normal.  Nursing note and vitals reviewed.    ED Treatments / Results  Labs (all labs ordered are listed, but only abnormal results are displayed) Labs Reviewed  COMPREHENSIVE METABOLIC PANEL - Abnormal; Notable for the following components:      Result Value   Glucose, Bld 108 (*)    All other components within normal limits  URINALYSIS, ROUTINE W REFLEX MICROSCOPIC - Abnormal; Notable for the following components:  Color, Urine STRAW (*)    All other components within normal limits  LIPASE, BLOOD  CBC    EKG EKG Interpretation  Date/Time:  Tuesday August 15 2017 09:32:52 EDT Ventricular Rate:  61 PR Interval:    QRS Duration: 95 QT Interval:  464 QTC Calculation: 468 R Axis:   5 Text Interpretation:  Sinus rhythm Multiple ventricular premature complexes Confirmed by Virgel Manifold [phone]) on 08/15/2017 10:54:00 AM   Radiology Ct Abdomen Pelvis W Contrast  Result Date: 08/15/2017 CLINICAL DATA:  77 year old male with a history of dizzy and lower abdominal pain EXAM: CT ABDOMEN AND PELVIS WITH CONTRAST TECHNIQUE: Multidetector CT imaging of the abdomen and pelvis was performed using the standard protocol following bolus administration of intravenous contrast. CONTRAST:  150mL ISOVUE-300 IOPAMIDOL (ISOVUE-300) INJECTION 61% COMPARISON:  CT 02/23/2015 FINDINGS: Lower chest: No acute abnormality. Hepatobiliary: Unremarkable liver.  Unremarkable gallbladder. Pancreas: Unremarkable pancreas Spleen: Unremarkable spleen Adrenals/Urinary Tract:  Unremarkable adrenal glands. Right kidney without hydronephrosis. No perinephric stranding. Symmetric perfusion to the left kidney. Multiple nonobstructive stones in the right collecting system, with the largest in the inferior collecting system measuring 6 mm. Unremarkable course of the right ureter. Left kidney without hydronephrosis. No perinephric stranding. Symmetric perfusion to the right kidney. Multiple nonobstructive stones in the left collecting system with the largest inferior measuring 5 mm. Small stone in the distal left ureter just above the ureterovesical junction measuring 3 mm. There does not appear to be obstruction as the left ureter is not dilated and the left collecting system is not dilated. Unremarkable appearance of the urinary bladder, partially distended. Stomach/Bowel: Unremarkable stomach. Unremarkable small bowel. No abnormal distention. No transition point. No focal wall thickening. No mesenteric adenopathy. Appendix is not visualized, however, no inflammatory changes are present adjacent to the cecum to indicate an appendicitis. 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 Vascular/Lymphatic: Mild atherosclerotic changes of the abdominal aorta. Bilateral iliac arteries and proximal femoral arteries patent. Mesenteric and bilateral renal arteries are patent. No lymphadenopathy. Reproductive: Transverse diameter of the prostate measures 5.3 cm Other: Fat containing ventral hernia. No significant inflammatory changes. No involvement of the bowel. Bilateral fat containing inguinal hernia. Musculoskeletal: No acute displaced fracture. Multilevel degenerative changes of the thoracolumbar spine. No bony canal narrowing. IMPRESSION: No acute CT finding. There is a small left ureteral stone just above the ureterovesical junction without associated ureteral dilation or hydronephrosis. If there is concern for infection, recommend correlation with urinalysis. Multiple  bilateral nonobstructing kidney stones. Diverticular disease without evidence of acute diverticulitis. Ventral hernia containing fat, without bowel herniation. Electronically Signed   By: Corrie Mckusick D.O.   On: 08/15/2017 11:49    Procedures Procedures (including critical care time)  Medications Ordered in ED Medications  iopamidol (ISOVUE-300) 61 % injection (has no administration in time range)  lactated ringers bolus 1,000 mL (0 mLs Intravenous Stopped 08/15/17 1039)  iopamidol (ISOVUE-300) 61 % injection 100 mL (100 mLs Intravenous Contrast Given 08/15/17 1114)     Initial Impression / Assessment and Plan / ED Course  I have reviewed the triage vital signs and the nursing notes.  Pertinent labs & imaging results that were available during my care of the patient were reviewed by me and considered in my medical decision making (see chart for details).     77 year old male with several week history of vague symptoms of generally not feeling well and then several day history of more acute lower abdominal pain.  Is down pain may  be secondary to a small ureteral stone noted on the CT.  I cannot make this explain all his other symptoms though.  I doubt emergent process though.  He is afebrile.  He is nontoxic.  Aside from mild lower abdominal tenderness exam is nonfocal.  UA is not consistent with UTI.  He is not significantly anemic.   It has been determined that no acute conditions requiring further emergency intervention are present at this time. The patient has been advised of the diagnosis and plan. I reviewed any labs and imaging including any potential incidental findings. We have discussed signs and symptoms that warrant return to the ED and they are listed in the discharge instructions.    Final Clinical Impressions(s) / ED Diagnoses   Final diagnoses:  Generalized weakness  Kidney stone    ED Discharge Orders    None       Virgel Manifold, MD 08/20/17 1143

## 2017-08-15 NOTE — ED Notes (Signed)
Patient ambulated to restroom with no assistance.  

## 2017-08-15 NOTE — ED Triage Notes (Signed)
Pt reports been feeling dizzy with lower abd pains and "just feel really bad" since last week. repors had some vomiting couple days ago but none now.

## 2017-08-21 ENCOUNTER — Encounter (HOSPITAL_COMMUNITY): Payer: Self-pay | Admitting: *Deleted

## 2017-08-21 ENCOUNTER — Other Ambulatory Visit: Payer: Self-pay

## 2017-08-21 ENCOUNTER — Emergency Department (HOSPITAL_COMMUNITY)
Admission: EM | Admit: 2017-08-21 | Discharge: 2017-08-21 | Disposition: A | Discharge status: Home / Self Care | Payer: Medicare Other | Attending: Emergency Medicine | Admitting: Emergency Medicine

## 2017-08-21 DIAGNOSIS — Z7982 Long term (current) use of aspirin: Secondary | ICD-10-CM | POA: Diagnosis not present

## 2017-08-21 DIAGNOSIS — Z79899 Other long term (current) drug therapy: Secondary | ICD-10-CM | POA: Insufficient documentation

## 2017-08-21 DIAGNOSIS — R531 Weakness: Secondary | ICD-10-CM | POA: Insufficient documentation

## 2017-08-21 LAB — CK: CK TOTAL: 65 U/L (ref 49–397)

## 2017-08-21 LAB — URINALYSIS, ROUTINE W REFLEX MICROSCOPIC
BILIRUBIN URINE: NEGATIVE
Glucose, UA: NEGATIVE mg/dL
HGB URINE DIPSTICK: NEGATIVE
Ketones, ur: NEGATIVE mg/dL
Leukocytes, UA: NEGATIVE
NITRITE: NEGATIVE
PROTEIN: NEGATIVE mg/dL
Specific Gravity, Urine: 1.016 (ref 1.005–1.030)
pH: 6 (ref 5.0–8.0)

## 2017-08-21 LAB — COMPREHENSIVE METABOLIC PANEL
ALK PHOS: 87 U/L (ref 38–126)
ALT: 21 U/L (ref 0–44)
ANION GAP: 8 (ref 5–15)
AST: 33 U/L (ref 15–41)
Albumin: 4 g/dL (ref 3.5–5.0)
BILIRUBIN TOTAL: 0.7 mg/dL (ref 0.3–1.2)
BUN: 20 mg/dL (ref 8–23)
CALCIUM: 9.4 mg/dL (ref 8.9–10.3)
CO2: 25 mmol/L (ref 22–32)
CREATININE: 0.94 mg/dL (ref 0.61–1.24)
Chloride: 105 mmol/L (ref 98–111)
GFR calc non Af Amer: >60 mL/min (ref >60)
Glucose, Bld: 124 mg/dL — ABNORMAL HIGH (ref 70–99)
Potassium: 4.3 mmol/L (ref 3.5–5.1)
SODIUM: 138 mmol/L (ref 135–145)
Total Protein: 7 g/dL (ref 6.5–8.1)

## 2017-08-21 LAB — CBC
HCT: 42.7 % (ref 39.0–52.0)
HEMOGLOBIN: 14.4 g/dL (ref 13.0–17.0)
MCH: 33.2 pg (ref 26.0–34.0)
MCHC: 33.7 g/dL (ref 30.0–36.0)
MCV: 98.4 fL (ref 78.0–100.0)
PLATELETS: 304 10*3/uL (ref 150–400)
RBC: 4.34 MIL/uL (ref 4.22–5.81)
RDW: 13.4 % (ref 11.5–15.5)
WBC: 5.9 10*3/uL (ref 4.0–10.5)

## 2017-08-21 LAB — LIPASE, BLOOD: Lipase: 28 U/L (ref 11–51)

## 2017-08-21 LAB — TROPONIN I

## 2017-08-21 MED ORDER — SODIUM CHLORIDE 0.9 % IV BOLUS
1000.0000 mL | Freq: Once | INTRAVENOUS | Status: AC
  Administered 2017-08-21: 1000 mL via INTRAVENOUS

## 2017-08-21 MED ORDER — LORAZEPAM 2 MG/ML IJ SOLN
0.5000 mg | Freq: Once | INTRAMUSCULAR | Status: AC
  Administered 2017-08-21: 0.5 mg via INTRAVENOUS
  Filled 2017-08-21: qty 1

## 2017-08-21 MED ORDER — ONDANSETRON HCL 4 MG/2ML IJ SOLN
4.0000 mg | Freq: Once | INTRAMUSCULAR | Status: AC | PRN
  Administered 2017-08-21: 4 mg via INTRAVENOUS
  Filled 2017-08-21: qty 2

## 2017-08-21 NOTE — ED Provider Notes (Signed)
[place] DEPT Provider Note   CSN: [id number] Arrival date & time: 08/21/17  0626     History   Chief Complaint Chief Complaint  Patient presents with  . Dizziness  . Nausea    HPI [name] is a 77 y.o. male.  77 year old male presents with nausea, dizziness, waves of paresthesias x2 days.  Does have a history of fibromyalgia but states that this is different.  Seen recently for a kidney stone and did self medicate with Percocet recent without resolved.  Denies any hematuria or dysuria.  Does work outside and feels that he may be dehydrated.  Notes that he feels at times that he might pass out.  Denies any associated chest pain or dyspnea.  No recent medication changes.     Past Medical History:  Diagnosis Date  . Arthritis    HANDS AND PT STATES HIS ARMS HURT  . Brain tumor [facility])    age 37 - brain tumor removed left- benign - occas slight headaches since the surgery  . Colonic mass 2001  . Fibromyalgia 1990's   . Frequent PVCs   . H/O inguinal hernia    right side  . Headache(784.0)   . Hernia, umbilical   . History of kidney stones    HX OF MULTIPLE KIDNEY STONES AND  CURRENTLY HAS BILATERAL RENAL STONES CAUSING ABDOMINAL AND BACK PAIN  . Hypertension    high blood pressure readings   . NICM (nonischemic cardiomyopathy) ([place])   . NSVT (nonsustained ventricular tachycardia) ([place])   . Premature atrial contractions   . Prolonged Q-T interval on ECG     Patient Active Problem List   Diagnosis Date Noted  . Frequent PVCs   . NSVT (nonsustained ventricular tachycardia) ([place])   . Premature atrial contractions   . Systolic dysfunction   . Essential hypertension 02/03/2015  . 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 BRAIN TUMOR EXCISION    . CARDIAC CATHETERIZATION N/A 04/09/2015   Procedure: Left Heart Cath and Coronary Angiography;  Surgeon: Burnell Blanks, MD;   Location: [place] [facility];  Service: Cardiovascular;  Laterality: N/A;  . COLON SURGERY  2001   COLON RESECTION FOR GROWTH - NOT CANCER  . CYSTOSCOPY WITH RETROGRADE PYELOGRAM, URETEROSCOPY AND STENT PLACEMENT Left 10/05/2012   Procedure: CYSTOSCOPY WITH RETROGRADE PYELOGRAM, URETEROSCOPY AND STENT PLACEMENT;  Surgeon: Alexis Frock, MD;  [facility];  Service: Urology;  Laterality: Left;  . CYSTOSCOPY/RETROGRADE/URETEROSCOPY/STONE EXTRACTION WITH BASKET Right 10/05/2012   Procedure: CYSTOSCOPY/RETROGRADE/URETEROSCOPY/STONE EXTRACTION WITH BASKET;  Surgeon: Alexis Frock, MD;  [facility];  Service: Urology;  Laterality: Right;  . CYSTOSCOPY/RETROGRADE/URETEROSCOPY/STONE EXTRACTION WITH BASKET Bilateral 10/24/2012   Procedure: CYSTOSCOPY/RETROGRADE/URETEROSCOPY/STONE EXTRACTION WITH BASKET;  Surgeon: Alexis Frock, MD;  [facility];  Service: Urology;  Laterality: Bilateral;  with bilateral stent placement  . HOLMIUM LASER APPLICATION Right 9/48/5462   Procedure: HOLMIUM LASER APPLICATION;  Surgeon: Alexis Frock, MD;  [facility];  Service: Urology;  Laterality: Right;  . HOLMIUM LASER APPLICATION Bilateral 08/10/5007   Procedure: HOLMIUM LASER APPLICATION;  Surgeon: Alexis Frock, MD;  [facility];  Service: Urology;  Laterality: Bilateral;  . LITHOTRIPSY    . TONSILLECTOMY          Home Medications    Prior to Admission medications   Medication Sig Start Date End Date Taking? Authorizing Provider  aspirin EC 81 MG tablet Take 1 tablet (81 mg  total) by mouth daily. 04/02/15   Dorothy Spark, MD  atorvastatin (LIPITOR) 10 MG tablet TAKE 1 TABLET BY MOUTH EVERY DAY Patient not taking: Reported on 08/15/2017 07/24/17   Evans Lance, MD  Bioflavonoid Products (BIOFLEX PO) Take 2 tablets by mouth daily.     [provider]  carvedilol (COREG) 3.125 MG tablet TAKE 1 TABLET (3.125 MG TOTAL) BY MOUTH 2 (TWO) TIMES DAILY. Patient not taking: Reported on  08/15/2017 08/08/16   Charlie Pitter, PA-C  Cranberry 1000 MG CAPS Take 1,000 mg by mouth daily.     [provider]  ibuprofen (ADVIL,MOTRIN) 200 MG tablet Take 600 mg by mouth 2 (two) times daily. Take 3 pills in am and 3 pills at bedtime    [provider]  lisinopril (PRINIVIL,ZESTRIL) 5 MG tablet TAKE 1 TABLET (5 MG TOTAL) BY MOUTH DAILY. Patient not taking: Reported on 08/15/2017 11/04/16   Dorothyann Peng, NP  Multiple Vitamin (MULTIVITAMIN) tablet Take 1 tablet by mouth daily.    [provider]  oxyCODONE-acetaminophen (PERCOCET/ROXICET) 5-325 MG tablet Take 1 tablet by mouth every 6 (six) hours as needed for severe pain. 08/15/17   Virgel Manifold, MD    Family History Family History  Problem Relation Age of Onset  . Colon cancer Father   . Heart disease Father        quad bypass    Social History Social History   Tobacco Use  . Smoking status: Never Smoker  . Smokeless tobacco: Never Used  Substance Use Topics  . Alcohol use: Yes    Alcohol/week: 4.2 oz    Types: 7 Standard drinks or equivalent per week  . Drug use: No     Allergies   Other; Prozac [fluoxetine hcl]; Sulfa antibiotics; Vicodin [hydrocodone-acetaminophen]; and Erythromycin   Review of Systems Review of Systems  All other systems reviewed and are negative.    Physical Exam Updated Vital Signs BP (!) 158/90 (BP Location: Right Arm)   Pulse 65   Temp (!) 97.5 F (36.4 C) (Oral)   Resp 12   Ht 1.829 m (6')   Wt 79.4 kg (175 lb)   SpO2 100%   BMI 23.73 kg/m   Physical Exam  Constitutional: He is oriented to person, place, and time. He appears well-developed and well-nourished.  Non-toxic appearance. No distress.  HENT:  Head: Normocephalic and atraumatic.  Eyes: Pupils are equal, round, and reactive to light. Conjunctivae, EOM and lids are normal.  Neck: Normal range of motion. Neck supple. No tracheal deviation present. No thyroid mass present.  Cardiovascular: Normal  rate, regular rhythm and normal heart sounds. Exam reveals no gallop.  No murmur heard. Pulmonary/Chest: Effort normal and breath sounds normal. No stridor. No respiratory distress. He has no decreased breath sounds. He has no wheezes. He has no rhonchi. He has no rales.  Abdominal: Soft. Normal appearance and bowel sounds are normal. He exhibits no distension. There is no tenderness. There is no rebound and no CVA tenderness.  Musculoskeletal: Normal range of motion. He exhibits no edema or tenderness.  Neurological: He is alert and oriented to person, place, and time. He has normal strength. He displays no tremor. No cranial nerve deficit or sensory deficit. Coordination and gait normal. GCS eye subscore is 4. GCS verbal subscore is 5. GCS motor subscore is 6.  Skin: Skin is warm and dry. No abrasion and no rash noted.  Psychiatric: He has a normal mood and affect. His speech  is normal and behavior is normal.  Nursing note and vitals reviewed.    ED Treatments / Results  Labs (all labs ordered are listed, but only abnormal results are displayed) Labs Reviewed  CBC  LIPASE, BLOOD  COMPREHENSIVE METABOLIC PANEL  URINALYSIS, ROUTINE W REFLEX MICROSCOPIC  CK    EKG EKG Interpretation  Date/Time:  Monday August 21 2017 09:36:48 EDT Ventricular Rate:  90 PR Interval:    QRS Duration: 107 QT Interval:  419 QTC Calculation: 429 R Axis:   8 Text Interpretation:  Sinus rhythm Multiform ventricular premature complexes Confirmed by Lacretia Leigh ([id number]) on 08/21/2017 10:06:45 AM   Radiology No results found.  Procedures Procedures (including critical care time)  Medications Ordered in ED Medications  LORazepam (ATIVAN) injection 0.5 mg (has no administration in time range)  ondansetron (ZOFRAN) injection 4 mg (4 mg Intravenous Given 08/21/17 1011)     Initial Impression / Assessment and Plan / ED Course  I have reviewed the triage vital signs and the nursing notes.  Pertinent  labs & imaging results that were available during my care of the patient were reviewed by me and considered in my medical decision making (see chart for details).    Patient given IV fluids here and feels better.  Work-up here for signs of muscle damage or other slight abnormalities is normal.  Patient has no focal neurological deficits.  Patient does have history of 5 myalgia as well as severe anxiety and this was confirmed by his significant other who feels he has been under a great deal of stress recently.  Patient stable for discharge with return precautions  Final Clinical Impressions(s) / ED Diagnoses   Final diagnoses:  None    ED Discharge Orders    None       Lacretia Leigh, MD 08/21/17 1258

## 2017-08-21 NOTE — ED Triage Notes (Signed)
Pt presents to the ED with reports nausea, dizziness, numbness and tingling bilaterally.  Pt hx of fibromyalgia.  Pt a/o x 4.  Pt also report ears are ringing. Pt reports generalized pain that is worse than normal.   Pt took a percocet at 5am today with no relief.

## 2017-08-23 ENCOUNTER — Ambulatory Visit: Payer: Medicare Other

## 2017-09-05 NOTE — Progress Notes (Deleted)
Subjective:   [name] is a 77 y.o. male who presents for Medicare Annual/Subsequent preventive examination.  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 TETANUS/TDAP  08/14/1960          Objective:    Vitals: There were no vitals taken for this visit.  There is no height or weight on file to calculate BMI.  Advanced Directives 08/21/2017 08/19/2016 04/09/2015 10/18/2012 10/05/2012 10/03/2012  Does Patient Have a Medical Advance Directive? No No No Patient does not have advance directive - Patient does not have advance directive;Patient would like information  Would patient like information on creating a medical advance directive? No - Patient declined Yes (MAU/Ambulatory/Procedural Areas - Information given) No - patient declined information - - Advance directive packet given  Pre-existing out of facility DNR order (yellow form or pink MOST form) - - - - No -    Tobacco Social History   Tobacco Use  Smoking Status Never Smoker  Smokeless Tobacco Never Used     Counseling given: Not Answered   Clinical Intake:     Past Medical History:  Diagnosis Date  . Arthritis    HANDS AND PT STATES HIS ARMS HURT  . Brain tumor [facility])    age 39 - brain tumor removed left- benign - occas slight headaches since the surgery  . Colonic mass 2001  . Fibromyalgia 1990's   . Frequent PVCs   . H/O inguinal hernia    right side  . Headache(784.0)   . Hernia, umbilical   . History of kidney stones    HX OF MULTIPLE KIDNEY STONES AND  CURRENTLY HAS BILATERAL RENAL STONES CAUSING ABDOMINAL AND BACK PAIN  . Hypertension    high blood pressure readings   . NICM (nonischemic cardiomyopathy) ([place])   . NSVT (nonsustained ventricular tachycardia) ([place])   . Premature atrial contractions   . 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 BRAIN TUMOR EXCISION    . CARDIAC CATHETERIZATION N/A 04/09/2015   Procedure: Left Heart Cath and Coronary Angiography;  Surgeon: Burnell Blanks, MD;  Location: [facility];  Service: Cardiovascular;  Laterality: N/A;  . COLON SURGERY  2001   COLON RESECTION FOR GROWTH - NOT CANCER  . CYSTOSCOPY WITH RETROGRADE PYELOGRAM, URETEROSCOPY AND STENT PLACEMENT Left 10/05/2012   Procedure: CYSTOSCOPY WITH RETROGRADE PYELOGRAM, URETEROSCOPY AND STENT PLACEMENT;  Surgeon: Alexis Frock, MD;  Location[facility];  Service: Urology;  Laterality: Left;  . CYSTOSCOPY/RETROGRADE/URETEROSCOPY/STONE EXTRACTION WITH BASKET Right 10/05/2012   Procedure: CYSTOSCOPY/RETROGRADE/URETEROSCOPY/STONE EXTRACTION WITH BASKET;  Surgeon: Alexis Frock, MD;  Location[facility];  Service: Urology;  Laterality: Right;  . CYSTOSCOPY/RETROGRADE/URETEROSCOPY/STONE EXTRACTION WITH BASKET Bilateral 10/24/2012   Procedure: CYSTOSCOPY/RETROGRADE/URETEROSCOPY/STONE EXTRACTION WITH BASKET;  Surgeon: Alexis Frock, MD;  Location[facility];  Service: Urology;  Laterality: Bilateral;  with bilateral stent placement  . HOLMIUM LASER APPLICATION Right 1/69/6789   Procedure: HOLMIUM LASER APPLICATION;  Surgeon: Alexis Frock, MD;  Location[facility];  Service: Urology;  Laterality: Right;  . HOLMIUM LASER APPLICATION Bilateral 3/81/0175   Procedure: HOLMIUM LASER APPLICATION;  Surgeon: Alexis Frock, MD;  Location[facility];  Service: Urology;  Laterality: Bilateral;  . LITHOTRIPSY    . TONSILLECTOMY     Family  History  Problem Relation Age of Onset  . Colon cancer Father   . Heart disease Father        quad bypass   Social History   Socioeconomic History  . Marital status: Single    Spouse name: Not on file  . Number of children: Not on file  . Years of education: Not on file  . Highest education level: Not on file  Occupational History  . Not on file  Social Needs  . Financial  resource strain: Not on file  . Food insecurity:    Worry: Not on file    Inability: Not on file  . Transportation needs:    Medical: Not on file    Non-medical: Not on file  Tobacco Use  . Smoking status: Never Smoker  . Smokeless tobacco: Never Used  Substance and Sexual Activity  . Alcohol use: Yes    Alcohol/week: 4.2 oz    Types: 7 Standard drinks or equivalent per week  . Drug use: No  . Sexual activity: Not on file  Lifestyle  . Physical activity:    Days per week: Not on file    Minutes per session: Not on file  . Stress: Not on file  Relationships  . Social connections:    Talks on phone: Not on file    Gets together: Not on file    Attends religious service: Not on file    Active member of club or organization: Not on file    Attends meetings of clubs or organizations: Not on file    Relationship status: Not on file  Other Topics Concern  . Not on file  Social History Narrative   Retired from architecture    Has one son - lives local    Likes to play Tennis    Outpatient Encounter Medications as of 09/06/2017  Medication Sig  . aspirin EC 81 MG tablet Take 1 tablet (81 mg total) by mouth daily.  Marland Kitchen atorvastatin (LIPITOR) 10 MG tablet TAKE 1 TABLET BY MOUTH EVERY DAY (Patient not taking: Reported on 08/15/2017)  . Bioflavonoid Products (BIOFLEX PO) Take 2 tablets by mouth daily.   . carvedilol (COREG) 3.125 MG tablet TAKE 1 TABLET (3.125 MG TOTAL) BY MOUTH 2 (TWO) TIMES DAILY. (Patient not taking: Reported on 08/15/2017)  . Cranberry 1000 MG CAPS Take 1,000 mg by mouth daily.   Marland Kitchen ibuprofen (ADVIL,MOTRIN) 200 MG tablet Take 600 mg by mouth 2 (two) times daily. Take 3 pills in am and 3 pills at bedtime  . lisinopril (PRINIVIL,ZESTRIL) 5 MG tablet TAKE 1 TABLET (5 MG TOTAL) BY MOUTH DAILY. (Patient not taking: Reported on 08/15/2017)  . Multiple Vitamin (MULTIVITAMIN) tablet Take 1 tablet by mouth daily.  Marland Kitchen oxyCODONE-acetaminophen (PERCOCET/ROXICET) 5-325 MG tablet Take 1  tablet by mouth every 6 (six) hours as needed for severe pain.   No facility-administered encounter medications on file as of 09/06/2017.     Activities of Daily Living No flowsheet data found.  Patient Care Team: Dorothyann Peng, NP as PCP - General (Family Medicine)   Assessment:   This is a routine wellness examination for [place].  Exercise Activities and Dietary recommendations    Goals    None      Fall Risk Fall Risk  08/19/2016 02/03/2015  Falls in the past year? No No     Depression Screen PHQ 2/9 Scores 08/19/2016 02/03/2015  PHQ - 2 Score 0 0    Cognitive Function   Ad8  score reviewed for issues:  Issues making decisions:  Less interest in hobbies / activities:  Repeats questions, stories (family complaining):  Trouble using ordinary gadgets (microwave, computer, phone):  Forgets the month or year:   Mismanaging finances:   Remembering appts:  Daily problems with thinking and/or memory: Ad8 score is=          Immunization History  Administered Date(s) Administered  . Influenza, High Dose Seasonal PF 02/03/2015  . Pneumococcal Conjugate-13 02/03/2015  . Pneumococcal Polysaccharide-23 08/19/2016     Screening Tests Health Maintenance  Topic Date Due  . TETANUS/TDAP  08/14/1960  . INFLUENZA VACCINE  09/07/2017  . PNA vac Low Risk Adult  Completed         Plan:      PCP Notes ***  Health Maintenance ***  Abnormal Screens  ***  Referrals  ***  Patient concerns; ***  Nurse Concerns; ***  Next PCP apt ***      I have personally reviewed and noted the following in the patient's chart:   . Medical and social history . Use of alcohol, tobacco or illicit drugs  . Current medications and supplements . Functional ability and status . Nutritional status . Physical activity . Advanced directives . List of other physicians . Hospitalizations, surgeries, and ER visits in previous 12 months . Vitals . Screenings  to include cognitive, depression, and falls . Referrals and appointments  In addition, I have reviewed and discussed with patient certain preventive protocols, quality metrics, and best practice recommendations. A written personalized care plan for preventive services as well as general preventive health recommendations were provided to patient.     Wynetta Fines, RN  09/05/2017

## 2017-09-06 ENCOUNTER — Ambulatory Visit: Payer: Medicare Other

## 2017-09-07 ENCOUNTER — Ambulatory Visit (INDEPENDENT_AMBULATORY_CARE_PROVIDER_SITE_OTHER): Payer: Medicare Other | Admitting: Adult Health

## 2017-09-07 ENCOUNTER — Encounter: Payer: Self-pay | Admitting: Adult Health

## 2017-09-07 VITALS — BP 150/76 | Temp 97.8°F | Wt 170.0 lb

## 2017-09-07 DIAGNOSIS — I10 Essential (primary) hypertension: Secondary | ICD-10-CM | POA: Diagnosis not present

## 2017-09-07 DIAGNOSIS — R5383 Other fatigue: Secondary | ICD-10-CM | POA: Diagnosis not present

## 2017-09-07 DIAGNOSIS — I493 Ventricular premature depolarization: Secondary | ICD-10-CM | POA: Diagnosis not present

## 2017-09-07 DIAGNOSIS — F419 Anxiety disorder, unspecified: Secondary | ICD-10-CM | POA: Diagnosis not present

## 2017-09-07 MED ORDER — LISINOPRIL 5 MG PO TABS: ORAL_TABLET | ORAL | 0 refills | Status: DC

## 2017-09-07 MED ORDER — CARVEDILOL 3.125 MG PO TABS: 3.1250 mg | ORAL_TABLET | Freq: Two times a day (BID) | ORAL | 0 refills | Status: DC

## 2017-09-07 MED ORDER — CITALOPRAM HYDROBROMIDE 10 MG PO TABS: 10.0000 mg | ORAL_TABLET | Freq: Every day | ORAL | 1 refills | Status: DC

## 2017-09-07 NOTE — Progress Notes (Signed)
Subjective:    Patient ID: [name], male    DOB: 1940/03/10, 77 y.o.   MRN: [id number]  HPI   77 year old male who  has a past medical history of Arthritis, Brain tumor ([place]), Colonic mass (2001), Fibromyalgia (1990's ), Frequent PVCs, H/O inguinal hernia, RCVELFYB(017.5), Hernia, umbilical, History of kidney stones, Hypertension, NICM (nonischemic cardiomyopathy) ([place]), NSVT (nonsustained ventricular tachycardia) ([place]), Premature atrial contractions, and Prolonged Q-T interval on ECG.  He was last seen over two years ago.   He has been seen in the emergency room on 2 separate occasions in the month of July.  Both for generalized weakness.  Today he states that he feels very fatigued and does not have much energy.  He is an avid Firefighter but says that he has not had motivation to play in the last few months, over the summer he is only played 1 or 2 games.  He does not feel short of breath or have any chest pain.  He denies any nausea vomiting or diarrhea.  Does report that he has been without his" heart medication" for some time now.  He occasionally will have palpitations.  He has not been monitoring his blood pressure at home.  Additionally, he reports increased anxiety over the last 1 or 2 months and is finding it difficult to go outside.  He often does feel claustrophobic.  He denies any depression.  He has had trouble with this in the past and was on some medication but he is unsure of what it was called.  BP Readings from Last 3 Encounters:  09/07/17 (!) 150/76  08/21/17 (!) 142/94  08/15/17 (!) 165/99    Review of Systems See HPI   Past Medical History:  Diagnosis Date  . Arthritis    HANDS AND PT STATES HIS ARMS HURT  . Brain tumor [facility])    age 47 - brain tumor removed left- benign - occas slight headaches since the surgery  . Colonic mass 2001  . Fibromyalgia 1990's   . Frequent PVCs   . H/O inguinal hernia    right side  . Headache(784.0)   . Hernia,  umbilical   . History of kidney stones    HX OF MULTIPLE KIDNEY STONES AND  CURRENTLY HAS BILATERAL RENAL STONES CAUSING ABDOMINAL AND BACK PAIN  . Hypertension    high blood pressure readings   . NICM (nonischemic cardiomyopathy) ([place])   . NSVT (nonsustained ventricular tachycardia) ([place])   . Premature atrial contractions   . Prolonged Q-T interval on ECG     Social History   Socioeconomic History  . Marital status: Single    Spouse name: Not on file  . Number of children: Not on file  . Years of education: Not on file  . Highest education level: Not on file  Occupational History  . Not on file  Social Needs  . Financial resource strain: Not on file  . Food insecurity:    Worry: Not on file    Inability: Not on file  . Transportation needs:    Medical: Not on file    Non-medical: Not on file  Tobacco Use  . Smoking status: Never Smoker  . Smokeless tobacco: Never Used  Substance and Sexual Activity  . Alcohol use: Yes    Alcohol/week: 4.2 oz    Types: 7 Standard drinks or equivalent per week  . Drug use: No  . Sexual activity: Not on file  Lifestyle  .  Physical activity:    Days per week: Not on file    Minutes per session: Not on file  . Stress: Not on file  Relationships  . Social connections:    Talks on phone: Not on file    Gets together: Not on file    Attends religious service: Not on file    Active member of club or organization: Not on file    Attends meetings of clubs or organizations: Not on file    Relationship status: Not on file  . Intimate partner violence:    Fear of current or ex partner: Not on file    Emotionally abused: Not on file    Physically abused: Not on file    Forced sexual activity: Not on file  Other Topics Concern  . 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 BRAIN TUMOR EXCISION    .  CARDIAC CATHETERIZATION N/A 04/09/2015   Procedure: Left Heart Cath and Coronary Angiography;  Surgeon: Burnell Blanks, MD;  Location: [facility];  Service: Cardiovascular;  Laterality: N/A;  . COLON SURGERY  2001   COLON RESECTION FOR GROWTH - NOT CANCER  . CYSTOSCOPY WITH RETROGRADE PYELOGRAM, URETEROSCOPY AND STENT PLACEMENT Left 10/05/2012   Procedure: CYSTOSCOPY WITH RETROGRADE PYELOGRAM, URETEROSCOPY AND STENT PLACEMENT;  Surgeon: Alexis Frock, MD;  [facility];  Service: Urology;  Laterality: Left;  . CYSTOSCOPY/RETROGRADE/URETEROSCOPY/STONE EXTRACTION WITH BASKET Right 10/05/2012   Procedure: CYSTOSCOPY/RETROGRADE/URETEROSCOPY/STONE EXTRACTION WITH BASKET;  Surgeon: Alexis Frock, MD;  [facility];  Service: Urology;  Laterality: Right;  . CYSTOSCOPY/RETROGRADE/URETEROSCOPY/STONE EXTRACTION WITH BASKET Bilateral 10/24/2012   Procedure: CYSTOSCOPY/RETROGRADE/URETEROSCOPY/STONE EXTRACTION WITH BASKET;  Surgeon: Alexis Frock, MD;  [facility];  Service: Urology;  Laterality: Bilateral;  with bilateral stent placement  . HOLMIUM LASER APPLICATION Right 1/82/9937   Procedure: HOLMIUM LASER APPLICATION;  Surgeon: Alexis Frock, MD;  [facility];  Service: Urology;  Laterality: Right;  . HOLMIUM LASER APPLICATION Bilateral 1/69/6789   Procedure: HOLMIUM LASER APPLICATION;  Surgeon: Alexis Frock, MD;  [facility];  Service: Urology;  Laterality: Bilateral;  . LITHOTRIPSY    . TONSILLECTOMY      Family History  Problem Relation Age of Onset  . Colon cancer Father   . Heart disease Father        quad bypass    Allergies  Allergen Reactions  . Other Other (See Comments)    /mangos/caused a severe rash  . Prozac [Fluoxetine Hcl]     Made him feel "loopy"  . Sulfa Antibiotics Hives  . Vicodin [Hydrocodone-Acetaminophen] Itching    Tolerates oxycodone  . Erythromycin Rash    Many years ago an oral antibiotic caused a rash(wife and patient think  it was erythromycin but they are not positive).    Current Outpatient Medications on File Prior to Visit  Medication Sig Dispense Refill  . aspirin EC 81 MG tablet Take 1 tablet (81 mg total) by mouth daily. 90 tablet 3  . Bioflavonoid Products (BIOFLEX PO) Take 2 tablets by mouth daily.     . Cranberry 1000 MG CAPS Take 1,000 mg by mouth daily.     Marland Kitchen ibuprofen (ADVIL,MOTRIN) 200 MG tablet Take 600 mg by mouth 2 (two) times daily. Take 3 pills in am and 3 pills at bedtime    .  Multiple Vitamin (MULTIVITAMIN) tablet Take 1 tablet by mouth daily.     No current facility-administered medications on file prior to visit.     BP (!) 150/76   Temp 97.8 F (36.6 C) (Oral)   Wt 170 lb (77.1 kg)   BMI 23.06 kg/m       Objective:   Physical Exam  Constitutional: He is oriented to person, place, and time. He appears well-developed and well-nourished. No distress.  Eyes: Pupils are equal, round, and reactive to light. EOM are normal.  Cardiovascular: Normal rate and regular rhythm. Frequent extrasystoles are present. Exam reveals no gallop and no friction rub.  No murmur heard. Pulmonary/Chest: Effort normal and breath sounds normal.  Abdominal: Soft. Bowel sounds are normal.  Musculoskeletal: Normal range of motion. He exhibits no edema, tenderness or deformity.  Neurological: He is alert and oriented to person, place, and time.  Skin: Skin is warm and dry. He is not diaphoretic.  Psychiatric: He has a normal mood and affect. His behavior is normal. Judgment and thought content normal.  Nursing note and vitals reviewed.     Assessment & Plan:  I will restart him on lisinopril 5 mg and Coreg 3.125 mg.  In addition to this I will prescribe him Celexa 10 mg for anxiety.  We will have him follow-up in 2 weeks and reassess at that time to his complete physical exam with labs.  He was advised to follow-up sooner if needed.  Dorothyann Peng, NP

## 2017-09-07 NOTE — Patient Instructions (Addendum)
I am going to start you back on lisinopril and Coreg for blood pressure control   I am going to start you on a low dose anti anxiety medication call Celexa   Follow up with me in about 2-3 weeks for your physical

## 2017-09-26 NOTE — Progress Notes (Signed)
Subjective:   [name] is a 77 y.o. male who presents for Medicare Annual/Subsequent preventive examination.  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 Feels tired upon waking. Does not get up during the night.    Health Maintenance Due  Topic Date Due  . INFLUENZA VACCINE  09/07/2017     Had fibro  Hurts his arms  [facility] for PSA and stones   Colonoscopy - had part of his colon taken out  No cancer, just a mass in 2000  [place] surgery  Can't think of the doctor's name but still fup    Colonoscopy need cardiac clearance for procedure  Father had colon cancer   Cardiac Risk Factors include: advanced age (>18men, >43 women);family history of premature cardiovascular disease;hypertension;male gender     Objective:    Vitals: BP 122/64   Pulse (!) 48   Ht 6\' 4"  (1.93 m)   Wt 169 lb 4 oz (76.8 kg)   BMI 20.60 kg/m   Body mass index is 20.6 kg/m.  Advanced Directives 09/27/2017 08/21/2017 08/19/2016 04/09/2015 10/18/2012 10/05/2012 10/03/2012  Does Patient Have a Medical Advance Directive? No No No No Patient does not have advance directive - Patient does not have advance directive;Patient would like information  Would patient like information on creating a medical advance directive? - No - Patient declined Yes (MAU/Ambulatory/Procedural Areas - Information given) No - patient declined information - - Advance directive packet given  Pre-existing out of facility DNR order (yellow form or pink MOST form) - - - - - No -    Tobacco Social  History   Tobacco Use  Smoking Status Never Smoker  Smokeless Tobacco Never Used     Counseling given: Yes   Clinical Intake:     Past Medical History:  Diagnosis Date  . Arthritis    HANDS AND PT STATES HIS ARMS HURT  . Brain tumor [facility])    age 47 - brain tumor removed left- benign - occas slight headaches since the surgery  . Colonic mass 2001  . Fibromyalgia 1990's   . Frequent PVCs   . H/O inguinal hernia    right side  . Headache(784.0)   . Hernia, umbilical   . History of kidney stones    HX OF MULTIPLE KIDNEY STONES AND  CURRENTLY HAS BILATERAL RENAL STONES CAUSING ABDOMINAL AND BACK PAIN  . Hypertension    high blood pressure readings   . NICM (nonischemic cardiomyopathy) ([place])   . NSVT (nonsustained ventricular tachycardia) ([place])   . Premature atrial contractions   . Prolonged Q-T interval on ECG    Past Surgical History:  Procedure Laterality Date  . BRAIN TUMOR EXCISION    . CARDIAC CATHETERIZATION N/A 04/09/2015   Procedure: Left Heart Cath and Coronary Angiography;  Surgeon: Burnell Blanks, MD;  Location: [place] [facility];  Service: Cardiovascular;  Laterality: N/A;  . COLON SURGERY  2001   COLON RESECTION FOR  GROWTH - NOT CANCER  . CYSTOSCOPY WITH RETROGRADE PYELOGRAM, URETEROSCOPY AND STENT PLACEMENT Left 10/05/2012   Procedure: CYSTOSCOPY WITH RETROGRADE PYELOGRAM, URETEROSCOPY AND STENT PLACEMENT;  Surgeon: Alexis Frock, MD;  Location: [facility];  Service: Urology;  Laterality: Left;  . CYSTOSCOPY/RETROGRADE/URETEROSCOPY/STONE EXTRACTION WITH BASKET Right 10/05/2012   Procedure: CYSTOSCOPY/RETROGRADE/URETEROSCOPY/STONE EXTRACTION WITH BASKET;  Surgeon: Alexis Frock, MD;  Location: [facility];  Service: Urology;  Laterality: Right;  . CYSTOSCOPY/RETROGRADE/URETEROSCOPY/STONE EXTRACTION WITH BASKET Bilateral 10/24/2012   Procedure: CYSTOSCOPY/RETROGRADE/URETEROSCOPY/STONE EXTRACTION WITH BASKET;  Surgeon: Alexis Frock, MD;  Location: [facility];  Service:  Urology;  Laterality: Bilateral;  with bilateral stent placement  . HOLMIUM LASER APPLICATION Right 3/76/2831   Procedure: HOLMIUM LASER APPLICATION;  Surgeon: Alexis Frock, MD;  Location: [facility];  Service: Urology;  Laterality: Right;  . HOLMIUM LASER APPLICATION Bilateral 06/24/6158   Procedure: HOLMIUM LASER APPLICATION;  Surgeon: Alexis Frock, MD;  Location: [facility];  Service: Urology;  Laterality: Bilateral;  . LITHOTRIPSY    . TONSILLECTOMY     Family History  Problem Relation Age of Onset  . Colon cancer Father   . Heart disease Father        quad bypass   Social History   Socioeconomic History  . Marital status: Single    Spouse name: Not on file  . Number of children: Not on file  . Years of education: Not on file  . Highest education level: Not on file  Occupational History  . Not on file  Social Needs  . Financial resource strain: Not on file  . Food insecurity:    Worry: Not on file    Inability: Not on file  . Transportation needs:    Medical: Not on file    Non-medical: Not on file  Tobacco Use  . Smoking status: Never Smoker  . Smokeless tobacco: Never Used  Substance and Sexual Activity  . Alcohol use: Yes    Alcohol/week: 7.0 standard drinks    Types: 7 Standard drinks or equivalent per week  . Drug use: Yes    Types: Amyl nitrate  . Sexual activity: Not on file  Lifestyle  . Physical activity:    Days per week: Not on file    Minutes per session: Not on file  . Stress: Not on file  Relationships  . Social connections:    Talks on phone: Not on file    Gets together: Not on file    Attends religious service: Not on file    Active member of club or organization: Not on file    Attends meetings of clubs or organizations: Not on file    Relationship status: Not on file  Other Topics Concern  . Not on file  Social History Narrative   Retired from architecture    Has one son - lives local    Likes to play Tennis    Outpatient Encounter  Medications as of 09/27/2017  Medication Sig  . aspirin EC 81 MG tablet Take 1 tablet (81 mg total) by mouth daily.  Marland Kitchen Bioflavonoid Products (BIOFLEX PO) Take 2 tablets by mouth daily.   . carvedilol (COREG) 3.125 MG tablet Take 1 tablet (3.125 mg total) by mouth 2 (two) times daily.  . citalopram (CELEXA) 10 MG tablet Take 2 tablets (20 mg total) by mouth daily.  . Cranberry 1000 MG CAPS Take 1,000 mg by mouth daily.   Marland Kitchen ibuprofen (ADVIL,MOTRIN) 200 MG tablet Take 600 mg by mouth 2 (two) times daily. Take  3 pills in am and 3 pills at bedtime  . lisinopril (PRINIVIL,ZESTRIL) 5 MG tablet TAKE 1 TABLET (5 MG TOTAL) BY MOUTH DAILY.  . Multiple Vitamin (MULTIVITAMIN) tablet Take 1 tablet by mouth daily.  . [DISCONTINUED] carvedilol (COREG) 3.125 MG tablet Take 1 tablet (3.125 mg total) by mouth 2 (two) times daily.  . [DISCONTINUED] citalopram (CELEXA) 10 MG tablet Take 1 tablet (10 mg total) by mouth daily.  . [DISCONTINUED] lisinopril (PRINIVIL,ZESTRIL) 5 MG tablet TAKE 1 TABLET (5 MG TOTAL) BY MOUTH DAILY.   No facility-administered encounter medications on file as of 09/27/2017.     Activities of Daily Living In your present state of health, do you have any difficulty performing the following activities: 09/27/2017  Hearing? N  Vision? N  Difficulty concentrating or making decisions? N  Walking or climbing stairs? N  Dressing or bathing? N  Doing errands, shopping? N  Preparing Food and eating ? N  Using the Toilet? N  In the past six months, have you accidently leaked urine? N  Do you have problems with loss of bowel control? N  Managing your Medications? N  Managing your Finances? N  Housekeeping or managing your Housekeeping? N  Some recent data might be hidden    Patient Care Team: Dorothyann Peng, NP as PCP - General (Family Medicine)   Assessment:   This is a routine wellness examination for [place].  Exercise Activities and Dietary recommendations Current Exercise Habits:  Home exercise routine, Type of exercise: Other - see comments(tennis competitively ), Time (Minutes): 60, Frequency (Times/Week): 5, Weekly Exercise (Minutes/Week): 300, Intensity: Moderate  Goals    . Patient Stated     Keep playing tennis        Fall Risk Fall Risk  09/27/2017 09/27/2017 08/19/2016 02/03/2015  Falls in the past year? No No No No     Depression Screen PHQ 2/9 Scores 09/27/2017 09/27/2017 08/19/2016 02/03/2015  PHQ - 2 Score 0 0 0 0    Cognitive Function MMSE - Mini Mental State Exam 09/27/2017  Not completed: (No Data)     Ad8 score reviewed for issues:  Issues making decisions:  Less interest in hobbies / activities:  Repeats questions, stories (family complaining):  Trouble using ordinary gadgets (microwave, computer, phone):  Forgets the month or year:   Mismanaging finances:   Remembering appts:  Daily problems with thinking and/or memory: Ad8 score is=0 Still plays tennis, remains active No failures of independent living      Immunization History  Administered Date(s) Administered  . Influenza, High Dose Seasonal PF 02/03/2015  . Pneumococcal Conjugate-13 02/03/2015  . Pneumococcal Polysaccharide-23 08/19/2016     Screening Tests Health Maintenance  Topic Date Due  . INFLUENZA VACCINE  09/07/2017  . TETANUS/TDAP  09/28/2018 (Originally 08/14/1959)  . PNA vac Low Risk Adult  Completed         Plan:      PCP Notes   Health Maintenance Did agree to take an Advanced Directive and reviewed this with him  [facility] for PSA and stones   Colonoscopy - had part of his colon taken out  No cancer, just a mass in 2000  [place] [facility]  Can't think of the doctor's name but still fup    Colonoscopy need cardiac clearance for procedure  Father had colon cancer   Abnormal Screens   to fup with Tommi Rumps regarding HR in one month  Referrals  none  Patient concerns; Continues to play tennis Celexa increased  for anxiety;   Nurse  Concerns; As noted  Next PCP apt Scheduling for fup on one month when he leaves today       I have personally reviewed and noted the following in the patient's chart:   . Medical and social history . Use of alcohol, tobacco or illicit drugs  . Current medications and supplements . Functional ability and status . Nutritional status . Physical activity . Advanced directives . List of other physicians . Hospitalizations, surgeries, and ER visits in previous 12 months . Vitals . Screenings to include cognitive, depression, and falls . Referrals and appointments  In addition, I have reviewed and discussed with patient certain preventive protocols, quality metrics, and best practice recommendations. A written personalized care plan for preventive services as well as general preventive health recommendations were provided to patient.     Wynetta Fines, RN  09/27/2017

## 2017-09-27 ENCOUNTER — Ambulatory Visit (INDEPENDENT_AMBULATORY_CARE_PROVIDER_SITE_OTHER): Payer: Medicare Other

## 2017-09-27 ENCOUNTER — Encounter: Payer: Self-pay | Admitting: Adult Health

## 2017-09-27 ENCOUNTER — Ambulatory Visit (INDEPENDENT_AMBULATORY_CARE_PROVIDER_SITE_OTHER): Payer: Medicare Other | Admitting: Adult Health

## 2017-09-27 VITALS — BP 122/64 | HR 48 | Ht 76.0 in | Wt 169.2 lb

## 2017-09-27 VITALS — BP 122/64 | HR 48 | Temp 98.1°F | Wt 169.2 lb

## 2017-09-27 DIAGNOSIS — F419 Anxiety disorder, unspecified: Secondary | ICD-10-CM

## 2017-09-27 DIAGNOSIS — Z0000 Encounter for general adult medical examination without abnormal findings: Secondary | ICD-10-CM

## 2017-09-27 DIAGNOSIS — I10 Essential (primary) hypertension: Secondary | ICD-10-CM

## 2017-09-27 DIAGNOSIS — R5383 Other fatigue: Secondary | ICD-10-CM | POA: Diagnosis not present

## 2017-09-27 DIAGNOSIS — I493 Ventricular premature depolarization: Secondary | ICD-10-CM

## 2017-09-27 LAB — CBC WITH DIFFERENTIAL/PLATELET
BASOS PCT: 0.7 % (ref 0.0–3.0)
Basophils Absolute: 0 10*3/uL (ref 0.0–0.1)
EOS PCT: 2.2 % (ref 0.0–5.0)
Eosinophils Absolute: 0.1 10*3/uL (ref 0.0–0.7)
HCT: 42.2 % (ref 39.0–52.0)
Hemoglobin: 14.2 g/dL (ref 13.0–17.0)
Lymphocytes Relative: 29.6 % (ref 12.0–46.0)
Lymphs Abs: 1.4 10*3/uL (ref 0.7–4.0)
MCHC: 33.6 g/dL (ref 30.0–36.0)
MCV: 99.5 fl (ref 78.0–100.0)
MONO ABS: 0.5 10*3/uL (ref 0.1–1.0)
MONOS PCT: 10 % (ref 3.0–12.0)
Neutro Abs: 2.7 10*3/uL (ref 1.4–7.7)
Neutrophils Relative %: 57.5 % (ref 43.0–77.0)
Platelets: 257 10*3/uL (ref 150.0–400.0)
RBC: 4.24 Mil/uL (ref 4.22–5.81)
RDW: 13.1 % (ref 11.5–15.5)
WBC: 4.7 10*3/uL (ref 4.0–10.5)

## 2017-09-27 LAB — HEPATIC FUNCTION PANEL
ALBUMIN: 4.3 g/dL (ref 3.5–5.2)
ALT: 14 U/L (ref 0–53)
AST: 22 U/L (ref 0–37)
Alkaline Phosphatase: 83 U/L (ref 39–117)
Bilirubin, Direct: 0.1 mg/dL (ref 0.0–0.3)
TOTAL PROTEIN: 6.6 g/dL (ref 6.0–8.3)
Total Bilirubin: 1 mg/dL (ref 0.2–1.2)

## 2017-09-27 LAB — TSH: TSH: 0.85 u[IU]/mL (ref 0.35–4.50)

## 2017-09-27 LAB — BASIC METABOLIC PANEL
BUN: 23 mg/dL (ref 6–23)
CHLORIDE: 106 meq/L (ref 96–112)
CO2: 29 mEq/L (ref 19–32)
Calcium: 9.5 mg/dL (ref 8.4–10.5)
Creatinine, Ser: 1.14 mg/dL (ref 0.40–1.50)
GFR: 66.36 mL/min (ref >60.00)
GLUCOSE: 98 mg/dL (ref 70–99)
POTASSIUM: 4.5 meq/L (ref 3.5–5.1)
Sodium: 141 mEq/L (ref 135–145)

## 2017-09-27 LAB — LIPID PANEL
CHOLESTEROL: 196 mg/dL (ref 0–200)
HDL: 43.9 mg/dL (ref >39.00)
LDL CALC: 129 mg/dL — AB (ref 0–99)
NonHDL: 152.21
Total CHOL/HDL Ratio: 4
Triglycerides: 114 mg/dL (ref 0.0–149.0)
VLDL: 22.8 mg/dL (ref 0.0–40.0)

## 2017-09-27 MED ORDER — LISINOPRIL 5 MG PO TABS: ORAL_TABLET | ORAL | 3 refills | Status: DC

## 2017-09-27 MED ORDER — CITALOPRAM HYDROBROMIDE 10 MG PO TABS: 20.0000 mg | ORAL_TABLET | Freq: Every day | ORAL | 0 refills | Status: DC

## 2017-09-27 MED ORDER — CARVEDILOL 3.125 MG PO TABS: 3.1250 mg | ORAL_TABLET | Freq: Two times a day (BID) | ORAL | 3 refills | Status: DC

## 2017-09-27 NOTE — Patient Instructions (Signed)
It was great seeing you today!   I am glad you are starting to feel better.   I have increased your Celexa from 10 mg to 20 mg. Please follow up in one month to see how you are doing

## 2017-09-27 NOTE — Patient Instructions (Addendum)
[name] , Thank you for taking time to come for your Medicare Wellness Visit. I appreciate your ongoing commitment to your health goals. Please review the following plan we discussed and let me know if I can assist you in the future.   An eye exam is recommended every other year   A Tetanus is recommended every 10 years. Medicare covers a tetanus if you have a cut or wound; otherwise, there may be a charge. If you had not had a tetanus with pertusses, known as the Tdap, you can take this anytime.   Keep getting your skin checks.   Will check on fup for colonoscopy   Shingrix is a vaccine for the prevention of Shingles in Adults 50 and older.  If you are on Medicare, the shingrix is covered under your Part D plan, so you will take both of the vaccines in the series at your pharmacy. Please check with your benefits regarding applicable copays or out of pocket expenses.  The Shingrix is given in 2 vaccines approx 8 weeks apart. You must receive the 2nd dose prior to 6 months from receipt of the first. Please have the pharmacist print out you Immunization  dates for our office records     These are the goals we discussed: Goals    . Patient Stated     Keep playing tennis        This is a list of the screening recommended for you and due dates:  Health Maintenance  Topic Date Due  . Flu Shot  09/07/2017  . Tetanus Vaccine  09/28/2018*  . Pneumonia vaccines  Completed  *Topic was postponed. The date shown is not the original due date.   '   Fall Prevention in the [place] can cause injuries. They can happen to people of all ages. There are many things you can do to make your home safe and to help prevent falls. What can I do on the outside of my home?  Regularly fix the edges of walkways and driveways and fix any cracks.  Remove anything that might make you trip as you walk through a door, such as a raised step or threshold.  Trim any bushes or trees on the path to  your home.  Use bright outdoor lighting.  Clear any walking paths of anything that might make someone trip, such as rocks or tools.  Regularly check to see if handrails are loose or broken. Make sure that both sides of any steps have handrails.  Any raised decks and porches should have guardrails on the edges.  Have any leaves, snow, or ice cleared regularly.  Use sand or salt on walking paths during winter.  Clean up any spills in your garage right away. This includes oil or grease spills. What can I do in the bathroom?  Use night lights.  Install grab bars by the toilet and in the tub and shower. Do not use towel bars as grab bars.  Use non-skid mats or decals in the tub or shower.  If you need to sit down in the shower, use a plastic, non-slip stool.  Keep the floor dry. Clean up any water that spills on the floor as soon as it happens.  Remove soap buildup in the tub or shower regularly.  Attach bath mats securely with double-sided non-slip rug tape.  Do not have throw rugs and other things on the floor that can make you trip. What can I do in the bedroom?  Use night lights.  Make sure that you have a light by your bed that is easy to reach.  Do not use any sheets or blankets that are too big for your bed. They should not hang down onto the floor.  Have a firm chair that has side arms. You can use this for support while you get dressed.  Do not have throw rugs and other things on the floor that can make you trip. What can I do in the kitchen?  Clean up any spills right away.  Avoid walking on wet floors.  Keep items that you use a lot in easy-to-reach places.  If you need to reach something above you, use a strong step stool that has a grab bar.  Keep electrical cords out of the way.  Do not use floor polish or wax that makes floors slippery. If you must use wax, use non-skid floor wax.  Do not have throw rugs and other things on the floor that can make  you trip. What can I do with my stairs?  Do not leave any items on the stairs.  Make sure that there are handrails on both sides of the stairs and use them. Fix handrails that are broken or loose. Make sure that handrails are as long as the stairways.  Check any carpeting to make sure that it is firmly attached to the stairs. Fix any carpet that is loose or worn.  Avoid having throw rugs at the top or bottom of the stairs. If you do have throw rugs, attach them to the floor with carpet tape.  Make sure that you have a light switch at the top of the stairs and the bottom of the stairs. If you do not have them, ask someone to add them for you. What else can I do to help prevent falls?  Wear shoes that: ? Do not have high heels. ? Have rubber bottoms. ? Are comfortable and fit you well. ? Are closed at the toe. Do not wear sandals.  If you use a stepladder: ? Make sure that it is fully opened. Do not climb a closed stepladder. ? Make sure that both sides of the stepladder are locked into place. ? Ask someone to hold it for you, if possible.  Clearly mark and make sure that you can see: ? Any grab bars or handrails. ? First and last steps. ? Where the edge of each step is.  Use tools that help you move around (mobility aids) if they are needed. These include: ? Canes. ? Walkers. ? Scooters. ? Crutches.  Turn on the lights when you go into a dark area. Replace any light bulbs as soon as they burn out.  Set up your furniture so you have a clear path. Avoid moving your furniture around.  If any of your floors are uneven, fix them.  If there are any pets around you, be aware of where they are.  Review your medicines with your doctor. Some medicines can make you feel dizzy. This can increase your chance of falling. Ask your doctor what other things that you can do to help prevent falls. This information is not intended to replace advice given to you by your health care provider.  Make sure you discuss any questions you have with your health care provider. Document Released: 11/20/2008 Document Revised: 07/02/2015 Document Reviewed: 02/28/2014 Elsevier Interactive Patient Education  2018 [place] Maintenance, Male A healthy lifestyle and preventive care is  important for your health and wellness. Ask your health care provider about what schedule of regular examinations is right for you. What should I know about weight and diet? Eat a Healthy Diet  Eat plenty of vegetables, fruits, whole grains, low-fat dairy products, and lean protein.  Do not eat a lot of foods high in solid fats, added sugars, or salt.  Maintain a Healthy Weight Regular exercise can help you achieve or maintain a healthy weight. You should:  Do at least 150 minutes of exercise each week. The exercise should increase your heart rate and make you sweat (moderate-intensity exercise).  Do strength-training exercises at least twice a week.  Watch Your Levels of Cholesterol and Blood Lipids  Have your blood tested for lipids and cholesterol every 5 years starting at 76 years of age. If you are at high risk for heart disease, you should start having your blood tested when you are 76 years old. You may need to have your cholesterol levels checked more often if: ? Your lipid or cholesterol levels are high. ? You are older than 76 years of age. ? You are at high risk for heart disease.  What should I know about cancer screening? Many types of cancers can be detected early and may often be prevented. Lung Cancer  You should be screened every year for lung cancer if: ? You are a current smoker who has smoked for at least 30 years. ? You are a former smoker who has quit within the past 15 years.  Talk to your health care provider about your screening options, when you should start screening, and how often you should be screened.  Colorectal Cancer  Routine colorectal cancer screening  usually begins at 76 years of age and should be repeated every 5-10 years until you are 76 years old. You may need to be screened more often if early forms of precancerous polyps or small growths are found. Your health care provider may recommend screening at an earlier age if you have risk factors for colon cancer.  Your health care provider may recommend using home test kits to check for hidden blood in the stool.  A small camera at the end of a tube can be used to examine your colon (sigmoidoscopy or colonoscopy). This checks for the earliest forms of colorectal cancer.  Prostate and Testicular Cancer  Depending on your age and overall health, your health care provider may do certain tests to screen for prostate and testicular cancer.  Talk to your health care provider about any symptoms or concerns you have about testicular or prostate cancer.  Skin Cancer  Check your skin from head to toe regularly.  Tell your health care provider about any new moles or changes in moles, especially if: ? There is a change in a mole's size, shape, or color. ? You have a mole that is larger than a pencil eraser.  Always use sunscreen. Apply sunscreen liberally and repeat throughout the day.  Protect yourself by wearing long sleeves, pants, a wide-brimmed hat, and sunglasses when outside.  What should I know about heart disease, diabetes, and high blood pressure?  If you are 70-50 years of age, have your blood pressure checked every 3-5 years. If you are 68 years of age or older, have your blood pressure checked every year. You should have your blood pressure measured twice-once when you are at a hospital or clinic, and once when you are not at a hospital or clinic. Record the  average of the two measurements. To check your blood pressure when you are not at a hospital or clinic, you can use: ? An automated blood pressure machine at a pharmacy. ? A home blood pressure monitor.  Talk to your health care  provider about your target blood pressure.  If you are between 53-54 years old, ask your health care provider if you should take aspirin to prevent heart disease.  Have regular diabetes screenings by checking your fasting blood sugar level. ? If you are at a normal weight and have a low risk for diabetes, have this test once every three years after the age of 63. ? If you are overweight and have a high risk for diabetes, consider being tested at a younger age or more often.  A one-time screening for abdominal aortic aneurysm (AAA) by ultrasound is recommended for men aged 70-75 years who are current or former smokers. What should I know about preventing infection? Hepatitis B If you have a higher risk for hepatitis B, you should be screened for this virus. Talk with your health care provider to find out if you are at risk for hepatitis B infection. Hepatitis C Blood testing is recommended for:  Everyone born from [birth year] through 1964.  Anyone with known risk factors for hepatitis C.  Sexually Transmitted Diseases (STDs)  You should be screened each year for STDs including gonorrhea and chlamydia if: ? You are sexually active and are younger than 76 years of age. ? You are older than 76 years of age and your health care provider tells you that you are at risk for this type of infection. ? Your sexual activity has changed since you were last screened and you are at an increased risk for chlamydia or gonorrhea. Ask your health care provider if you are at risk.  Talk with your health care provider about whether you are at high risk of being infected with HIV. Your health care provider may recommend a prescription medicine to help prevent HIV infection.  What else can I do?  Schedule regular health, dental, and eye exams.  Stay current with your vaccines (immunizations).  Do not use any tobacco products, such as cigarettes, chewing tobacco, and e-cigarettes. If you need help quitting, ask  your health care provider.  Limit alcohol intake to no more than 2 drinks per day. One drink equals 12 ounces of beer, 5 ounces of wine, or 1 ounces of hard liquor.  Do not use street drugs.  Do not share needles.  Ask your health care provider for help if you need support or information about quitting drugs.  Tell your health care provider if you often feel depressed.  Tell your health care provider if you have ever been abused or do not feel safe at home. This information is not intended to replace advice given to you by your health care provider. Make sure you discuss any questions you have with your health care provider. Document Released: 07/23/2007 Document Revised: 09/23/2015 Document Reviewed: 10/28/2014 Elsevier Interactive Patient Education  [employer].

## 2017-09-27 NOTE — Progress Notes (Signed)
I have reviewed documentation for AWV and Advance Care Planning provided by the health coach and agree with documentation. I was immediately available for questions.  

## 2017-09-27 NOTE — Progress Notes (Signed)
Subjective:    Patient ID: [name], male    DOB: [birth year], 77 y.o.   MRN: [id number]  HPI Patient presents for yearly preventative medicine examination. He is a pleasant 77 year old male who  has a past medical history of Arthritis, Brain tumor ([place]), Colonic mass (2001), Fibromyalgia (1990's ), Frequent PVCs, H/O inguinal hernia, WTUUEKCM(034.9), Hernia, umbilical, History of kidney stones, Hypertension, NICM (nonischemic cardiomyopathy) ([place]), NSVT (nonsustained ventricular tachycardia) ([place]), Premature atrial contractions, and Prolonged Q-T interval on ECG.  Essential Hypertension -currently prescribed lisinopril 5 mg and Coreg 3.125 mg.  Restarted on these medications earlier this month he had without his meds "for a while". BP Readings from Last 3 Encounters:  09/27/17 122/64  09/07/17 (!) 150/76  08/21/17 (!) 142/94   Anxiety -prescribed Celexa 10 mg, he was started on this approximately 3 weeks ago. Reports some improvement in anxiety but still often feels pretty anxious and claustrophobic. He does report that he has been able to get outside and get back to playing tennis, which he considers improvement.    All immunizations and health maintenance protocols were reviewed with the patient and needed orders were placed. UTD on vaccinations.   Appropriate screening laboratory values were ordered for the patient including screening of hyperlipidemia, renal function and hepatic function.  Medication reconciliation,  past medical history, social history, problem list and allergies were reviewed in detail with the patient  Goals were established with regard to weight loss, exercise, and  diet in compliance with medications. He reports that he has been able to get out and start exercising again, he is a avid Firefighter.   End of life planning was discussed.  He no longer needs to have a colonoscopy.  He does not participate in routine dental or vision screens  He has been  seen by his Dermatologist within the last year.   Review of Systems  Constitutional: Positive for fatigue.  HENT: Negative.   Eyes: Negative.   Respiratory: Negative.   Cardiovascular: Negative.   Gastrointestinal: Negative.   Endocrine: Negative.   Genitourinary: Negative.   Musculoskeletal: Negative.   Skin: Negative.   Allergic/Immunologic: Negative.   Neurological: Negative.   Hematological: Negative.   Psychiatric/Behavioral: The patient is nervous/anxious.   All other systems reviewed and are negative.  Past Medical History:  Diagnosis Date  . Arthritis    HANDS AND PT STATES HIS ARMS HURT  . Brain tumor [facility])    age 37 - brain tumor removed left- benign - occas slight headaches since the surgery  . Colonic mass 2001  . Fibromyalgia 1990's   . Frequent PVCs   . H/O inguinal hernia    right side  . Headache(784.0)   . Hernia, umbilical   . History of kidney stones    HX OF MULTIPLE KIDNEY STONES AND  CURRENTLY HAS BILATERAL RENAL STONES CAUSING ABDOMINAL AND BACK PAIN  . Hypertension    high blood pressure readings   . NICM (nonischemic cardiomyopathy) ([place])   . NSVT (nonsustained ventricular tachycardia) ([place])   . Premature atrial contractions   . Prolonged Q-T interval on ECG     Social History   Socioeconomic History  . Marital status: Single    Spouse name: Not on file  . Number of children: Not on file  . Years of education: Not on file  . Highest education level: Not on file  Occupational History  . Not on file  Social Needs  . Emergency planning/management officer  strain: Not on file  . Food insecurity:    Worry: Not on file    Inability: Not on file  . Transportation needs:    Medical: Not on file    Non-medical: Not on file  Tobacco Use  . Smoking status: Never Smoker  . Smokeless tobacco: Never Used  Substance and Sexual Activity  . Alcohol use: Yes    Alcohol/week: 7.0 standard drinks    Types: 7 Standard drinks or equivalent per week  . Drug use: No    . Sexual activity: Not on file  Lifestyle  . Physical activity:    Days per week: Not on file    Minutes per session: Not on file  . Stress: Not on file  Relationships  . Social connections:    Talks on phone: Not on file    Gets together: Not on file    Attends religious service: Not on file    Active member of club or organization: Not on file    Attends meetings of clubs or organizations: Not on file    Relationship status: Not on file  . Intimate partner violence:    Fear of current or ex partner: Not on file    Emotionally abused: Not on file    Physically abused: Not on file    Forced sexual activity: Not on file  Other Topics Concern  . 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 BRAIN TUMOR EXCISION    . CARDIAC CATHETERIZATION N/A 04/09/2015   Procedure: Left Heart Cath and Coronary Angiography;  Surgeon: Burnell Blanks, MD;  Location: [facility];  Service: Cardiovascular;  Laterality: N/A;  . COLON SURGERY  2001   COLON RESECTION FOR GROWTH - NOT CANCER  . CYSTOSCOPY WITH RETROGRADE PYELOGRAM, URETEROSCOPY AND STENT PLACEMENT Left 10/05/2012   Procedure: CYSTOSCOPY WITH RETROGRADE PYELOGRAM, URETEROSCOPY AND STENT PLACEMENT;  Surgeon: Alexis Frock, MD;  Location: [facility];  Service: Urology;  Laterality: Left;  . CYSTOSCOPY/RETROGRADE/URETEROSCOPY/STONE EXTRACTION WITH BASKET Right 10/05/2012   Procedure: CYSTOSCOPY/RETROGRADE/URETEROSCOPY/STONE EXTRACTION WITH BASKET;  Surgeon: Alexis Frock, MD;  Location: [facility];  Service: Urology;  Laterality: Right;  . CYSTOSCOPY/RETROGRADE/URETEROSCOPY/STONE EXTRACTION WITH BASKET Bilateral 10/24/2012   Procedure: CYSTOSCOPY/RETROGRADE/URETEROSCOPY/STONE EXTRACTION WITH BASKET;  Surgeon: Alexis Frock, MD;  Location: [facility];  Service: Urology;  Laterality: Bilateral;  with bilateral  stent placement  . HOLMIUM LASER APPLICATION Right 3/97/6734   Procedure: HOLMIUM LASER APPLICATION;  Surgeon: Alexis Frock, MD;  Location: [facility];  Service: Urology;  Laterality: Right;  . HOLMIUM LASER APPLICATION Bilateral 1/93/7902   Procedure: HOLMIUM LASER APPLICATION;  Surgeon: Alexis Frock, MD;  Location: [facility];  Service: Urology;  Laterality: Bilateral;  . LITHOTRIPSY    . TONSILLECTOMY      Family History  Problem Relation Age of Onset  . Colon cancer Father   . Heart disease Father        quad bypass    Allergies  Allergen Reactions  . Other Other (See Comments)    /mangos/caused a severe rash  . Prozac [Fluoxetine Hcl]     Made him feel "loopy"  . Sulfa Antibiotics Hives  . Vicodin [Hydrocodone-Acetaminophen] Itching    Tolerates oxycodone  . Erythromycin Rash    Many years ago an oral antibiotic caused a rash(wife and patient think  it was erythromycin but they are not positive).    Current Outpatient Medications on File Prior to Visit  Medication Sig Dispense Refill  . aspirin EC 81 MG tablet Take 1 tablet (81 mg total) by mouth daily. 90 tablet 3  . Bioflavonoid Products (BIOFLEX PO) Take 2 tablets by mouth daily.     . Cranberry 1000 MG CAPS Take 1,000 mg by mouth daily.     Marland Kitchen ibuprofen (ADVIL,MOTRIN) 200 MG tablet Take 600 mg by mouth 2 (two) times daily. Take 3 pills in am and 3 pills at bedtime    . Multiple Vitamin (MULTIVITAMIN) tablet Take 1 tablet by mouth daily.     No current facility-administered medications on file prior to visit.     BP 122/64 (BP Location: Left Arm, Patient Position: Sitting, Cuff Size: Normal)   Pulse (!) 48   Temp 98.1 F (36.7 C) (Oral)   Wt 169 lb 4 oz (76.8 kg)   SpO2 98%   BMI 22.95 kg/m      Objective:   Physical Exam  Constitutional: He is oriented to person, place, and time. He appears well-developed and well-nourished. No distress.  HENT:  Head: Normocephalic and atraumatic.  Right Ear: External ear  normal.  Left Ear: External ear normal.  Nose: Nose normal.  Mouth/Throat: Oropharynx is clear and moist. He has dentures. No oropharyngeal exudate.  Eyes: Pupils are equal, round, and reactive to light. Conjunctivae and EOM are normal. Right eye exhibits no discharge. Left eye exhibits no discharge. No scleral icterus.  Neck: Normal range of motion. Neck supple. No JVD present. No tracheal deviation present. No thyromegaly present.  Cardiovascular: Regular rhythm, normal heart sounds and intact distal pulses.  Occasional extrasystoles are present. Bradycardia present. Exam reveals no gallop and no friction rub.  No murmur heard. Pulmonary/Chest: Effort normal and breath sounds normal. No stridor. No respiratory distress. He has no wheezes. He has no rales. He exhibits no tenderness.  Abdominal: Soft. Bowel sounds are normal. He exhibits no distension and no mass. There is no tenderness. There is no rebound and no guarding. No hernia.  Musculoskeletal: Normal range of motion. He exhibits no edema, tenderness or deformity.  Lymphadenopathy:    He has no cervical adenopathy.  Neurological: He is alert and oriented to person, place, and time. No cranial nerve deficit. Coordination normal.  Skin: Skin is warm and dry. Capillary refill takes less than 2 seconds. No rash noted. He is not diaphoretic. No erythema. No pallor.  Psychiatric: He has a normal mood and affect. His behavior is normal. Judgment and thought content normal.  Nursing note and vitals reviewed.     Assessment & Plan:  1. Essential hypertension - better controlled.  - No change in medications at this time - Basic metabolic panel - CBC with Differential/Platelet - TSH - Hepatic function panel - Lipid panel - carvedilol (COREG) 3.125 MG tablet; Take 1 tablet (3.125 mg total) by mouth 2 (two) times daily.  Dispense: 180 tablet; Refill: 3 - lisinopril (PRINIVIL,ZESTRIL) 5 MG tablet; TAKE 1 TABLET (5 MG TOTAL) BY MOUTH DAILY.   Dispense: 90 tablet; Refill: 3  2. Anxiety - Starting to improve. Will increase Celexa from 10 mg to 20 mg. Follow up in one month  - citalopram (CELEXA) 10 MG tablet; Take 2 tablets (20 mg total) by mouth daily.  Dispense: 90 tablet; Refill: 0  3. Fatigue, unspecified type - Starting to improve with exercise  - Basic metabolic panel -  CBC with Differential/Platelet - TSH - Hepatic function panel - Lipid panel  4. Frequent PVCs - asymptomatic bradycardia. Consistent with past visits.  - carvedilol (COREG) 3.125 MG tablet; Take 1 tablet (3.125 mg total) by mouth 2 (two) times daily.  Dispense: 180 tablet; Refill: 3 - EKG 12-Lead- Marked sinus  Bradycardia  - frequent ectopic ventricular beat s  # VECs = 2 BORDERLINE RHYTHM. Consistent with previous EKGS when he is taking his medications. - Will recheck at Follow up in one month   Dorothyann Peng, NP

## 2017-10-31 ENCOUNTER — Encounter: Payer: Self-pay | Admitting: Adult Health

## 2017-10-31 ENCOUNTER — Ambulatory Visit (INDEPENDENT_AMBULATORY_CARE_PROVIDER_SITE_OTHER): Payer: Medicare Other | Admitting: Adult Health

## 2017-10-31 DIAGNOSIS — F419 Anxiety disorder, unspecified: Secondary | ICD-10-CM

## 2017-10-31 MED ORDER — CITALOPRAM HYDROBROMIDE 20 MG PO TABS: 20.0000 mg | ORAL_TABLET | Freq: Every day | ORAL | 1 refills | Status: DC

## 2017-10-31 NOTE — Progress Notes (Signed)
Subjective:    Patient ID: [name], male    DOB: [birth year], 77 y.o.   MRN: [id number]  HPI 77 year old male who  has a past medical history of Arthritis, Brain tumor ([place]), Colonic mass (2001), Fibromyalgia (1990's ), Frequent PVCs, H/O inguinal hernia, WNUUVOZD(664.4), Hernia, umbilical, History of kidney stones, Hypertension, NICM (nonischemic cardiomyopathy) ([place]), NSVT (nonsustained ventricular tachycardia) ([place]), Premature atrial contractions, and Prolonged Q-T interval on ECG.  He presents to the office today for follow up regarding anxiety and fatigue. He reports that since increasing Celexa his fatigue and anxiety have continued to improve. 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 He was able to go to a [facility] concert over the weekend and did not feel anxious.   Currently, he has no complaints. He is happy with his progress.    Review of Systems See HPI   Past Medical History:  Diagnosis Date  . Arthritis    HANDS AND PT STATES HIS ARMS HURT  . Brain tumor [facility])    age 21 - brain tumor removed left- benign - occas slight headaches since the surgery  . Colonic mass 2001  . Fibromyalgia 1990's   . Frequent PVCs   . H/O inguinal hernia    right side  . Headache(784.0)   . Hernia, umbilical   . History of kidney stones    HX OF MULTIPLE KIDNEY STONES AND  CURRENTLY HAS BILATERAL RENAL STONES CAUSING ABDOMINAL AND BACK PAIN  . Hypertension    high blood pressure readings   . NICM (nonischemic cardiomyopathy) ([place])   . NSVT (nonsustained ventricular tachycardia) ([place])   . Premature atrial contractions   . Prolonged Q-T interval on ECG     Social History   Socioeconomic History  . Marital status: Single    Spouse name: Not on file  . Number of children: Not on file  . Years of education: Not on file  . Highest education level: Not on file  Occupational History  . Not on file  Social Needs   . Financial resource strain: Not on file  . Food insecurity:    Worry: Not on file    Inability: Not on file  . Transportation needs:    Medical: Not on file    Non-medical: Not on file  Tobacco Use  . Smoking status: Never Smoker  . Smokeless tobacco: Never Used  Substance and Sexual Activity  . Alcohol use: Yes    Alcohol/week: 7.0 standard drinks    Types: 7 Standard drinks or equivalent per week  . Drug use: Yes    Types: Amyl nitrate  . Sexual activity: Not on file  Lifestyle  . Physical activity:    Days per week: Not on file    Minutes per session: Not on file  . Stress: Not on file  Relationships  . Social connections:    Talks on phone: Not on file    Gets together: Not on file    Attends religious service: Not on file    Active member of club or organization: Not on file    Attends meetings of clubs or organizations: Not on file    Relationship status: Not on file  . Intimate partner violence:    Fear of current or ex partner: Not on file    Emotionally abused: Not on file    Physically abused: Not on file  Forced sexual activity: Not on file  Other Topics Concern  . 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 BRAIN TUMOR EXCISION    . CARDIAC CATHETERIZATION N/A 04/09/2015   Procedure: Left Heart Cath and Coronary Angiography;  Surgeon: Burnell Blanks, MD;  Location: [facility] [place] [facility];  Service: Cardiovascular;  Laterality: N/A;  . COLON SURGERY  2001   COLON RESECTION FOR GROWTH - NOT CANCER  . CYSTOSCOPY WITH RETROGRADE PYELOGRAM, URETEROSCOPY AND STENT PLACEMENT Left 10/05/2012   Procedure: CYSTOSCOPY WITH RETROGRADE PYELOGRAM, URETEROSCOPY AND STENT PLACEMENT;  Surgeon: Alexis Frock, MD;  [facility];  Service: Urology;  Laterality: Left;  . CYSTOSCOPY/RETROGRADE/URETEROSCOPY/STONE EXTRACTION WITH BASKET  Right 10/05/2012   Procedure: CYSTOSCOPY/RETROGRADE/URETEROSCOPY/STONE EXTRACTION WITH BASKET;  Surgeon: Alexis Frock, MD;  Location[facility];  Service: Urology;  Laterality: Right;  . CYSTOSCOPY/RETROGRADE/URETEROSCOPY/STONE EXTRACTION WITH BASKET Bilateral 10/24/2012   Procedure: CYSTOSCOPY/RETROGRADE/URETEROSCOPY/STONE EXTRACTION WITH BASKET;  Surgeon: Alexis Frock, MD;  Location[facility];  Service: Urology;  Laterality: Bilateral;  with bilateral stent placement  . HOLMIUM LASER APPLICATION Right 0/86/7619   Procedure: HOLMIUM LASER APPLICATION;  Surgeon: Alexis Frock, MD;  [facility];  Service: Urology;  Laterality: Right;  . HOLMIUM LASER APPLICATION Bilateral 06/15/3265   Procedure: HOLMIUM LASER APPLICATION;  Surgeon: Alexis Frock, MD;  [facility];  Service: Urology;  Laterality: Bilateral;  . LITHOTRIPSY    . TONSILLECTOMY      Family History  Problem Relation Age of Onset  . Colon cancer Father   . Heart disease Father        quad bypass    Allergies  Allergen Reactions  . Other Other (See Comments)    /mangos/caused a severe rash  . Prozac [Fluoxetine Hcl]     Made him feel "loopy"  . Sulfa Antibiotics Hives  . Vicodin [Hydrocodone-Acetaminophen] Itching    Tolerates oxycodone  . Erythromycin Rash    Many years ago an oral antibiotic caused a rash(wife and patient think it was erythromycin but they are not positive).    Current Outpatient Medications on File Prior to Visit  Medication Sig Dispense Refill  . aspirin EC 81 MG tablet Take 1 tablet (81 mg total) by mouth daily. 90 tablet 3  . Bioflavonoid Products (BIOFLEX PO) Take 2 tablets by mouth daily.     . carvedilol (COREG) 3.125 MG tablet Take 1 tablet (3.125 mg total) by mouth 2 (two) times daily. 180 tablet 3  . citalopram (CELEXA) 10 MG tablet Take 2 tablets (20 mg total) by mouth daily. 90 tablet 0  . Cranberry 1000 MG CAPS Take 1,000 mg by mouth daily.     Marland Kitchen ibuprofen (ADVIL,MOTRIN) 200 MG  tablet Take 600 mg by mouth 2 (two) times daily. Take 3 pills in am and 3 pills at bedtime    . lisinopril (PRINIVIL,ZESTRIL) 5 MG tablet TAKE 1 TABLET (5 MG TOTAL) BY MOUTH DAILY. 90 tablet 3  . Multiple Vitamin (MULTIVITAMIN) tablet Take 1 tablet by mouth daily.     No current facility-administered medications on file prior to visit.     BP 140/80 (BP Location: Left Arm, Patient Position: Sitting, Cuff Size: Normal)   Pulse (!) 54   Temp 97.8 F (36.6 C) (Oral)   Wt 169 lb 3.2 oz (76.7 kg)  SpO2 97%   BMI 20.60 kg/m       Objective:   Physical Exam  Constitutional: He is oriented to person, place, and time. He appears well-developed and well-nourished. No distress.  Cardiovascular: Regular rhythm, normal heart sounds and intact distal pulses.  Occasional extrasystoles are present. Bradycardia present.  Pulmonary/Chest: Effort normal and breath sounds normal.  Neurological: He is alert and oriented to person, place, and time.  Skin: Skin is warm and dry. He is not diaphoretic.  Psychiatric: He has a normal mood and affect. His behavior is normal. Thought content normal.  Nursing note and vitals reviewed.     Assessment & Plan:  1. Anxiety - Controlled on Celexa 20 mg.  - No change in medications  - Follow up in 3 months or sooner if needed  Dorothyann Peng, NP

## 2018-02-09 ENCOUNTER — Encounter: Payer: Self-pay | Admitting: Adult Health

## 2018-02-09 ENCOUNTER — Ambulatory Visit (INDEPENDENT_AMBULATORY_CARE_PROVIDER_SITE_OTHER): Payer: Medicare Other | Admitting: Adult Health

## 2018-02-09 DIAGNOSIS — F419 Anxiety disorder, unspecified: Secondary | ICD-10-CM

## 2018-02-09 MED ORDER — CITALOPRAM HYDROBROMIDE 40 MG PO TABS: 40.0000 mg | ORAL_TABLET | Freq: Every day | ORAL | 1 refills | Status: DC

## 2018-02-09 NOTE — Progress Notes (Signed)
Subjective:    Patient ID: [name], male    DOB: 1940-08-11, 78 y.o.   MRN: [id number]  HPI  78 year old male who  has a past medical history of Arthritis, Brain tumor ([place]), Colonic mass (2001), Fibromyalgia (1990's ), Frequent PVCs, H/O inguinal hernia, JSEGBTDV(761.6), Hernia, umbilical, History of kidney stones, Hypertension, NICM (nonischemic cardiomyopathy) ([place]), NSVT (nonsustained ventricular tachycardia) ([place]), Premature atrial contractions, and Prolonged Q-T interval on ECG.  He presents to the office today for 80-month follow-up regarding anxiety.  He was last seen in September 2019, it had been 1 month since Celexa was increased from 10 mg to 20 mg.  At that visit he reported that since the increase his anxiety had continued to improve.  He was back to exercising and competing in tennis tournaments.  He had no complaints was and was happy with the progress that he had experienced thus far.  Today in the office he continues to feel as though his symptoms are controlled but he continues to have anxiety, that is especially apparent in setting with large crowds. He is interesting in increasing celexa to see if this becomes more manageable. He continues to play tennis on a weekly basis and is now teaching youth tennis lessons.    Review of Systems See HPI   Past Medical History:  Diagnosis Date  . Arthritis    HANDS AND PT STATES HIS ARMS HURT  . Brain tumor [facility])    age 41 - brain tumor removed left- benign - occas slight headaches since the surgery  . Colonic mass 2001  . Fibromyalgia 1990's   . Frequent PVCs   . H/O inguinal hernia    right side  . Headache(784.0)   . Hernia, umbilical   . History of kidney stones    HX OF MULTIPLE KIDNEY STONES AND  CURRENTLY HAS BILATERAL RENAL STONES CAUSING ABDOMINAL AND BACK PAIN  . Hypertension    high blood pressure readings   . NICM (nonischemic cardiomyopathy) ([place])   . NSVT (nonsustained ventricular tachycardia) ([place])     . Premature atrial contractions   . Prolonged Q-T interval on ECG     Social History   Socioeconomic History  . Marital status: Single    Spouse name: Not on file  . Number of children: Not on file  . Years of education: Not on file  . Highest education level: Not on file  Occupational History  . Not on file  Social Needs  . Financial resource strain: Not on file  . Food insecurity:    Worry: Not on file    Inability: Not on file  . Transportation needs:    Medical: Not on file    Non-medical: Not on file  Tobacco Use  . Smoking status: Never Smoker  . Smokeless tobacco: Never Used  Substance and Sexual Activity  . Alcohol use: Yes    Alcohol/week: 7.0 standard drinks    Types: 7 Standard drinks or equivalent per week  . Drug use: Yes    Types: Amyl nitrate  . Sexual activity: Not on file  Lifestyle  . Physical activity:    Days per week: Not on file    Minutes per session: Not on file  . Stress: Not on file  Relationships  . Social connections:    Talks on phone: Not on file    Gets together: Not on file    Attends religious service: Not on file    Active member  of club or organization: Not on file    Attends meetings of clubs or organizations: Not on file    Relationship status: Not on file  . Intimate partner violence:    Fear of current or ex partner: Not on file    Emotionally abused: Not on file    Physically abused: Not on file    Forced sexual activity: Not on file  Other Topics Concern  . 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 BRAIN TUMOR EXCISION    . CARDIAC CATHETERIZATION N/A 04/09/2015   Procedure: Left Heart Cath and Coronary Angiography;  Surgeon: Burnell Blanks, MD;  Location: [facility];  Service: Cardiovascular;  Laterality: N/A;  . COLON SURGERY  2001   COLON RESECTION FOR GROWTH -  NOT CANCER  . CYSTOSCOPY WITH RETROGRADE PYELOGRAM, URETEROSCOPY AND STENT PLACEMENT Left 10/05/2012   Procedure: CYSTOSCOPY WITH RETROGRADE PYELOGRAM, URETEROSCOPY AND STENT PLACEMENT;  Surgeon: Alexis Frock, MD;  Location: [facility];  Service: Urology;  Laterality: Left;  . CYSTOSCOPY/RETROGRADE/URETEROSCOPY/STONE EXTRACTION WITH BASKET Right 10/05/2012   Procedure: CYSTOSCOPY/RETROGRADE/URETEROSCOPY/STONE EXTRACTION WITH BASKET;  Surgeon: Alexis Frock, MD;  Location: [facility];  Service: Urology;  Laterality: Right;  . CYSTOSCOPY/RETROGRADE/URETEROSCOPY/STONE EXTRACTION WITH BASKET Bilateral 10/24/2012   Procedure: CYSTOSCOPY/RETROGRADE/URETEROSCOPY/STONE EXTRACTION WITH BASKET;  Surgeon: Alexis Frock, MD;  Location[facility];  Service: Urology;  Laterality: Bilateral;  with bilateral stent placement  . HOLMIUM LASER APPLICATION Right 5/85/2778   Procedure: HOLMIUM LASER APPLICATION;  Surgeon: Alexis Frock, MD;  Location: [facility];  Service: Urology;  Laterality: Right;  . HOLMIUM LASER APPLICATION Bilateral 2/42/3536   Procedure: HOLMIUM LASER APPLICATION;  Surgeon: Alexis Frock, MD;  [facility];  Service: Urology;  Laterality: Bilateral;  . LITHOTRIPSY    . TONSILLECTOMY      Family History  Problem Relation Age of Onset  . Colon cancer Father   . Heart disease Father        quad bypass    Allergies  Allergen Reactions  . Other Other (See Comments)    /mangos/caused a severe rash  . Prozac [Fluoxetine Hcl]     Made him feel "loopy"  . Sulfa Antibiotics Hives  . Vicodin [Hydrocodone-Acetaminophen] Itching    Tolerates oxycodone  . Erythromycin Rash    Many years ago an oral antibiotic caused a rash(wife and patient think it was erythromycin but they are not positive).    Current Outpatient Medications on File Prior to Visit  Medication Sig Dispense Refill  . aspirin EC 81 MG tablet Take 1 tablet (81 mg total) by mouth daily. 90 tablet 3  . Bioflavonoid Products (BIOFLEX  PO) Take 2 tablets by mouth daily.     . Cranberry 1000 MG CAPS Take 1,000 mg by mouth daily.     Marland Kitchen ibuprofen (ADVIL,MOTRIN) 200 MG tablet Take 600 mg by mouth 2 (two) times daily. Take 3 pills in am and 3 pills at bedtime    . lisinopril (PRINIVIL,ZESTRIL) 5 MG tablet TAKE 1 TABLET (5 MG TOTAL) BY MOUTH DAILY. 90 tablet 3  . Multiple Vitamin (MULTIVITAMIN) tablet Take 1 tablet by mouth daily.    . carvedilol (COREG) 3.125 MG tablet Take 1 tablet (3.125 mg total) by mouth 2 (two) times daily. 180 tablet 3   No current facility-administered medications on  file prior to visit.     BP (!) 144/82   Temp 97.9 F (36.6 C)   Wt 173 lb (78.5 kg)   BMI 21.06 kg/m       Objective:   Physical Exam Vitals signs and nursing note reviewed.  Constitutional:      Appearance: He is normal weight.  Cardiovascular:     Rate and Rhythm: Normal rate and regular rhythm.     Pulses: Normal pulses.     Heart sounds: Normal heart sounds.  Pulmonary:     Effort: Pulmonary effort is normal.     Breath sounds: Normal breath sounds.  Musculoskeletal: Normal range of motion.  Skin:    General: Skin is warm and dry.     Capillary Refill: Capillary refill takes less than 2 seconds.  Neurological:     General: No focal deficit present.     Mental Status: He is alert and oriented to person, place, and time.  Psychiatric:        Mood and Affect: Mood normal.        Behavior: Behavior normal.        Thought Content: Thought content normal.        Judgment: Judgment normal.       Assessment & Plan:  1. Anxiety - Will increase Celexa to 40 mg to see if this helps control anxiety better.  - citalopram (CELEXA) 40 MG tablet; Take 1 tablet (40 mg total) by mouth daily.  Dispense: 90 tablet; Refill: 1 - Follow up as needed  Dorothyann Peng, NP

## 2018-02-15 ENCOUNTER — Other Ambulatory Visit: Payer: Self-pay

## 2018-02-15 ENCOUNTER — Emergency Department (HOSPITAL_COMMUNITY): Payer: Medicare Other

## 2018-02-15 ENCOUNTER — Encounter (HOSPITAL_COMMUNITY): Payer: Self-pay | Admitting: Emergency Medicine

## 2018-02-15 ENCOUNTER — Emergency Department (HOSPITAL_COMMUNITY)
Admission: EM | Admit: 2018-02-15 | Discharge: 2018-02-15 | Disposition: A | Discharge status: Home / Self Care | Payer: Medicare Other | Attending: Emergency Medicine | Admitting: Emergency Medicine

## 2018-02-15 DIAGNOSIS — S20211A Contusion of right front wall of thorax, initial encounter: Secondary | ICD-10-CM | POA: Diagnosis not present

## 2018-02-15 DIAGNOSIS — R0781 Pleurodynia: Secondary | ICD-10-CM | POA: Diagnosis not present

## 2018-02-15 DIAGNOSIS — S0990XA Unspecified injury of head, initial encounter: Secondary | ICD-10-CM | POA: Diagnosis not present

## 2018-02-15 DIAGNOSIS — I10 Essential (primary) hypertension: Secondary | ICD-10-CM | POA: Diagnosis not present

## 2018-02-15 DIAGNOSIS — Z79899 Other long term (current) drug therapy: Secondary | ICD-10-CM | POA: Diagnosis not present

## 2018-02-15 DIAGNOSIS — Y929 Unspecified place or not applicable: Secondary | ICD-10-CM | POA: Insufficient documentation

## 2018-02-15 DIAGNOSIS — Z7982 Long term (current) use of aspirin: Secondary | ICD-10-CM | POA: Diagnosis not present

## 2018-02-15 DIAGNOSIS — Y9373 Activity, racquet and hand sports: Secondary | ICD-10-CM | POA: Insufficient documentation

## 2018-02-15 DIAGNOSIS — S0003XA Contusion of scalp, initial encounter: Secondary | ICD-10-CM | POA: Diagnosis not present

## 2018-02-15 DIAGNOSIS — W19XXXA Unspecified fall, initial encounter: Secondary | ICD-10-CM

## 2018-02-15 DIAGNOSIS — Y998 Other external cause status: Secondary | ICD-10-CM | POA: Insufficient documentation

## 2018-02-15 DIAGNOSIS — W0110XA Fall on same level from slipping, tripping and stumbling with subsequent striking against unspecified object, initial encounter: Secondary | ICD-10-CM | POA: Insufficient documentation

## 2018-02-15 DIAGNOSIS — S299XXA Unspecified injury of thorax, initial encounter: Secondary | ICD-10-CM | POA: Diagnosis not present

## 2018-02-15 MED ORDER — BACITRACIN ZINC 500 UNIT/GM EX OINT
TOPICAL_OINTMENT | CUTANEOUS | Status: AC
  Administered 2018-02-15: 1
  Filled 2018-02-15: qty 0.9

## 2018-02-15 NOTE — Discharge Instructions (Signed)
It was my pleasure taking care of you today!   Keep wound clean and dry.   Tylenol as needed for pain.   Ice and/or heat can help with pain as well.   Return to ER for new or worsening symptoms, any additional concerns.

## 2018-02-15 NOTE — ED Provider Notes (Signed)
[facility] DEPT Provider Note   CSN: [id number] Arrival date & time: 02/15/18  1936     History   Chief Complaint Chief Complaint  Patient presents with  . Fall    HPI [name] is a 78 y.o. male.  The history is provided by the patient and medical records. No language interpreter was used.  Fall    [name] is a 78 y.o. male  with a PMH as listed below who presents to the Emergency Department for evaluation after a fall just prior to arrival.  Patient states he was playing tennis when he accidentally tripped and fell, landing on right side and hit forehead.  No loss of consciousness.  Not on blood thinners.  No nausea or vomiting.  No neck pain or back pain.  Reports right-sided rib pain and wound/hematoma to the forehead.  Tetanus is up-to-date.  No medications taken prior to arrival for symptoms.   Past Medical History:  Diagnosis Date  . Arthritis    HANDS AND PT STATES HIS ARMS HURT  . Brain tumor [facility])    age 52 - brain tumor removed left- benign - occas slight headaches since the surgery  . Colonic mass 2001  . Fibromyalgia 1990's   . Frequent PVCs   . H/O inguinal hernia    right side  . Headache(784.0)   . Hernia, umbilical   . History of kidney stones    HX OF MULTIPLE KIDNEY STONES AND  CURRENTLY HAS BILATERAL RENAL STONES CAUSING ABDOMINAL AND BACK PAIN  . Hypertension    high blood pressure readings   . NICM (nonischemic cardiomyopathy) ([place])   . NSVT (nonsustained ventricular tachycardia) ([place])   . Premature atrial contractions   . Prolonged Q-T interval on ECG     Patient Active Problem List   Diagnosis Date Noted  . Frequent PVCs   . NSVT (nonsustained ventricular tachycardia) ([place])   . Premature atrial contractions   . Systolic dysfunction   . Essential hypertension 02/03/2015  . 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 BRAIN TUMOR EXCISION     . CARDIAC CATHETERIZATION N/A 04/09/2015   Procedure: Left Heart Cath and Coronary Angiography;  Surgeon: Burnell Blanks, MD;  Location: [facility];  Service: Cardiovascular;  Laterality: N/A;  . COLON SURGERY  2001   COLON RESECTION FOR GROWTH - NOT CANCER  . CYSTOSCOPY WITH RETROGRADE PYELOGRAM, URETEROSCOPY AND STENT PLACEMENT Left 10/05/2012   Procedure: CYSTOSCOPY WITH RETROGRADE PYELOGRAM, URETEROSCOPY AND STENT PLACEMENT;  Surgeon: Alexis Frock, MD;  [facility];  Service: Urology;  Laterality: Left;  . CYSTOSCOPY/RETROGRADE/URETEROSCOPY/STONE EXTRACTION WITH BASKET Right 10/05/2012   Procedure: CYSTOSCOPY/RETROGRADE/URETEROSCOPY/STONE EXTRACTION WITH BASKET;  Surgeon: Alexis Frock, MD;  [facility];  Service: Urology;  Laterality: Right;  . CYSTOSCOPY/RETROGRADE/URETEROSCOPY/STONE EXTRACTION WITH BASKET Bilateral 10/24/2012   Procedure: CYSTOSCOPY/RETROGRADE/URETEROSCOPY/STONE EXTRACTION WITH BASKET;  Surgeon: Alexis Frock, MD;  [facility];  Service: Urology;  Laterality: Bilateral;  with bilateral stent placement  . HOLMIUM LASER APPLICATION Right 6/76/7209   Procedure: HOLMIUM LASER APPLICATION;  Surgeon: Alexis Frock, MD;  [facility];  Service: Urology;  Laterality: Right;  . HOLMIUM LASER APPLICATION Bilateral 4/70/9628   Procedure: HOLMIUM LASER APPLICATION;  Surgeon: Alexis Frock, MD;  [facility];  Service: Urology;  Laterality: Bilateral;  . LITHOTRIPSY    . TONSILLECTOMY          Home Medications  Prior to Admission medications   Medication Sig Start Date End Date Taking? Authorizing Provider  aspirin EC 81 MG tablet Take 1 tablet (81 mg total) by mouth daily. 04/02/15   Dorothy Spark, MD  Bioflavonoid Products (BIOFLEX PO) Take 2 tablets by mouth daily.     [provider]  carvedilol (COREG) 3.125 MG tablet Take 1 tablet (3.125 mg total) by mouth 2 (two) times daily. 09/27/17 12/26/17  Nafziger, Tommi Rumps, NP    citalopram (CELEXA) 40 MG tablet Take 1 tablet (40 mg total) by mouth daily. 02/09/18 05/10/18  Nafziger, Tommi Rumps, NP  Cranberry 1000 MG CAPS Take 1,000 mg by mouth daily.     [provider]  ibuprofen (ADVIL,MOTRIN) 200 MG tablet Take 600 mg by mouth 2 (two) times daily. Take 3 pills in am and 3 pills at bedtime    [provider]  lisinopril (PRINIVIL,ZESTRIL) 5 MG tablet TAKE 1 TABLET (5 MG TOTAL) BY MOUTH DAILY. 09/27/17   Nafziger, Tommi Rumps, NP  Multiple Vitamin (MULTIVITAMIN) tablet Take 1 tablet by mouth daily.    [provider]    Family History Family History  Problem Relation Age of Onset  . Colon cancer Father   . Heart disease Father        quad bypass    Social History Social History   Tobacco Use  . Smoking status: Never Smoker  . Smokeless tobacco: Never Used  Substance Use Topics  . Alcohol use: Yes    Alcohol/week: 7.0 standard drinks    Types: 7 Standard drinks or equivalent per week  . Drug use: Yes    Types: Amyl nitrate     Allergies   Other; Prozac [fluoxetine hcl]; Sulfa antibiotics; Vicodin [hydrocodone-acetaminophen]; and Erythromycin   Review of Systems Review of Systems  Musculoskeletal: Positive for arthralgias.  Skin: Positive for wound.  All other systems reviewed and are negative.    Physical Exam Updated Vital Signs BP (!) 179/97 (BP Location: Left Arm)   Pulse (!) 55   Temp 97.6 F (36.4 C) (Oral)   Resp 18   Ht 6' (1.829 m)   Wt 77.1 kg   SpO2 99%   BMI 23.06 kg/m   Physical Exam Vitals signs and nursing note reviewed.  Constitutional:      General: He is not in acute distress.    Appearance: He is well-developed.  HENT:     Head: Normocephalic.   Cardiovascular:     Rate and Rhythm: Normal rate and regular rhythm.     Heart sounds: Normal heart sounds. No murmur.  Pulmonary:     Effort: Pulmonary effort is normal. No respiratory distress.     Breath sounds: Normal breath sounds.     Comments:  Tenderness to right mid lateral rib cage.  No crepitus.  No overlying skin changes. Abdominal:     General: There is no distension.     Palpations: Abdomen is soft.     Tenderness: There is no abdominal tenderness.  Musculoskeletal:     Comments: No C/T/L-spine tenderness.  Full range of motion and 5/5 muscle strength in all 4 extremities without tenderness.  Skin:    General: Skin is warm and dry.  Neurological:     Mental Status: He is alert and oriented to person, place, and time.     Comments: Alert, oriented, thought content appropriate, able to give a coherent history. Speech is clear and goal oriented, able to follow commands.  Cranial Nerves:  II:  Peripheral visual fields grossly normal, pupils equal, round, reactive to light III, IV, VI: EOM intact bilaterally, ptosis not present V,VII: smile symmetric, eyes kept closed tightly against resistance, facial light touch sensation equal VIII: hearing grossly normal IX, X: symmetric soft palate movement, uvula elevates symmetrically  XI: bilateral shoulder shrug symmetric and strong XII: midline tongue extension Sensory to light touch normal in all four extremities.      ED Treatments / Results  Labs (all labs ordered are listed, but only abnormal results are displayed) Labs Reviewed - No data to display  EKG None  Radiology Dg Ribs Unilateral W/chest Right  Result Date: 02/15/2018 CLINICAL DATA:  Fall, tennis accident, right rib pain EXAM: RIGHT RIBS AND CHEST - 3+ VIEW COMPARISON:  08/16/2007 FINDINGS: Normal heart size and vascularity. Aorta is ectatic and tortuous with atherosclerosis. Trachea is midline. Lungs remain clear. No focal pneumonia, collapse or consolidation. Right ribs demonstrate old rib fractures of the lower ribs. No definite acute displaced rib fracture. No chest wall asymmetry, hematoma, or subcutaneous emphysema. IMPRESSION: No definite acute finding by plain radiography Aortic atherosclerosis, ectasia,  and tortuosity, slightly increased compared 2009. Electronically Signed   By: Jerilynn Mages.  Shick M.D.   On: 02/15/2018 20:32   Ct Head Wo Contrast  Result Date: 02/15/2018 CLINICAL DATA:  Recent fall with head injury, initial encounter EXAM: CT HEAD WITHOUT CONTRAST TECHNIQUE: Contiguous axial images were obtained from the base of the skull through the vertex without intravenous contrast. COMPARISON:  07/29/2007 FINDINGS: Brain: Mild atrophic changes are noted. No findings to suggest acute hemorrhage, acute infarction or space-occupying mass lesion are seen. Vascular: No hyperdense vessel or unexpected calcification. Skull: Normal. Negative for fracture or focal lesion. Sinuses/Orbits: No acute finding. Other: Mild right scalp hematoma is noted in the forehead. IMPRESSION: Mild atrophic changes without acute abnormality. Mild red scalp hematoma as described. Electronically Signed   By: Inez Catalina M.D.   On: 02/15/2018 20:26    Procedures Procedures (including critical care time)  Medications Ordered in ED Medications - No data to display   Initial Impression / Assessment and Plan / ED Course  I have reviewed the triage vital signs and the nursing notes.  Pertinent labs & imaging results that were available during my care of the patient were reviewed by me and considered in my medical decision making (see chart for details).    [name] is a 78 y.o. male who presents to ED for evaluation after a fall just prior to arrival.  No focal neuro deficits on exam.  Not on anticoagulation.  Does have a hematoma to the forehead.  He also has tenderness to the right rib cage.  Lungs are clear to auscultation bilaterally.  Chest x-ray and rib series as well as CT head negative for acute abnormalities. Evaluation does not show pathology that would require ongoing emergent intervention or inpatient treatment.  Medic home care instructions, follow-up care and return precautions were discussed with patient and  son at bedside.  All questions answered.  Patient discussed with Dr. Tomi Bamberger who agrees with treatment plan.    Final Clinical Impressions(s) / ED Diagnoses   Final diagnoses:  Fall, initial encounter  Minor head injury, initial encounter  Contusion of rib on right side, initial encounter    ED Discharge Orders    None       , Ozella Almond, PA-C 02/15/18 2114    Dorie Rank, MD 02/15/18 2356

## 2018-02-15 NOTE — ED Triage Notes (Signed)
Patient reports trip and fall playing tennis tonight. Hematoma to right forehead. Denies LOC. Also c/o right rib pain. Ambulatory. Denies taking blood thinners.

## 2018-05-04 ENCOUNTER — Telehealth: Payer: Self-pay | Admitting: Adult Health

## 2018-05-04 DIAGNOSIS — F419 Anxiety disorder, unspecified: Secondary | ICD-10-CM

## 2018-05-04 DIAGNOSIS — I10 Essential (primary) hypertension: Secondary | ICD-10-CM

## 2018-05-04 DIAGNOSIS — I493 Ventricular premature depolarization: Secondary | ICD-10-CM

## 2018-05-04 NOTE — Telephone Encounter (Signed)
Copied from [place] [id number]. Topic: Quick Communication - Rx Refill/Question >> May 04, 2018  9:04 AM Richardo Priest, NT wrote: Medication:  lisinopril (PRINIVIL,ZESTRIL) 5 MG tablet carvedilol (COREG) 3.125 MG tablet  citalopram (CELEXA) 40 MG tablet  Has the patient contacted their pharmacy? Yes, patient called stating they are currently running low on their medication. Patient states he is in [facility], stuck at a job due to [employer]. Requesting medication refills be sent to [facility] instead, due to it being closer.  Preferred Pharmacy (with phone number or street name):  [address] [place], [place], [place] [address] Phone: [phone]  Agent: Please be advised that RX refills may take up to 3 business days. We ask that you follow-up with your pharmacy.

## 2018-05-04 NOTE — Telephone Encounter (Signed)
Patients son Remo Lipps called wanting to know had the prescriptions he called about earlier today been called into the pharmacy in [place] but pharmacy address is [employer].

## 2018-05-07 MED ORDER — CARVEDILOL 3.125 MG PO TABS: 3.1250 mg | ORAL_TABLET | Freq: Two times a day (BID) | ORAL | 0 refills | Status: DC

## 2018-05-07 MED ORDER — CITALOPRAM HYDROBROMIDE 40 MG PO TABS: 40.0000 mg | ORAL_TABLET | Freq: Every day | ORAL | 0 refills | Status: DC

## 2018-05-07 MED ORDER — LISINOPRIL 5 MG PO TABS: ORAL_TABLET | ORAL | 0 refills | Status: DC

## 2018-05-07 NOTE — Telephone Encounter (Signed)
Rx sent to the pharmacy by e-scribe.  Nothing further needed.

## 2018-05-08 NOTE — Telephone Encounter (Signed)
This encounter is blank.

## 2018-05-10 NOTE — Telephone Encounter (Signed)
My apologies, this message was taken care of on March 30

## 2018-07-27 ENCOUNTER — Other Ambulatory Visit: Payer: Self-pay | Admitting: Adult Health

## 2018-07-27 DIAGNOSIS — F419 Anxiety disorder, unspecified: Secondary | ICD-10-CM

## 2018-07-27 NOTE — Telephone Encounter (Signed)
Sent to the pharmacy by e-scribe. 

## 2018-08-25 ENCOUNTER — Other Ambulatory Visit: Payer: Self-pay | Admitting: Adult Health

## 2018-08-25 DIAGNOSIS — I10 Essential (primary) hypertension: Secondary | ICD-10-CM

## 2018-08-25 DIAGNOSIS — I493 Ventricular premature depolarization: Secondary | ICD-10-CM

## 2018-08-28 NOTE — Telephone Encounter (Signed)
Sent to the pharmacy by e-scribe for 90 days. 

## 2019-01-02 ENCOUNTER — Other Ambulatory Visit: Payer: Self-pay

## 2019-02-10 IMAGING — CR DG RIBS W/ CHEST 3+V*R*
5 series · 5 of 5 positions shown · non-contrast
Comparison: 08/16/2007

CLINICAL DATA: Fall, tennis accident, right rib pain

EXAM:
RIGHT RIBS AND CHEST - 3+ VIEW

[w chest pa]
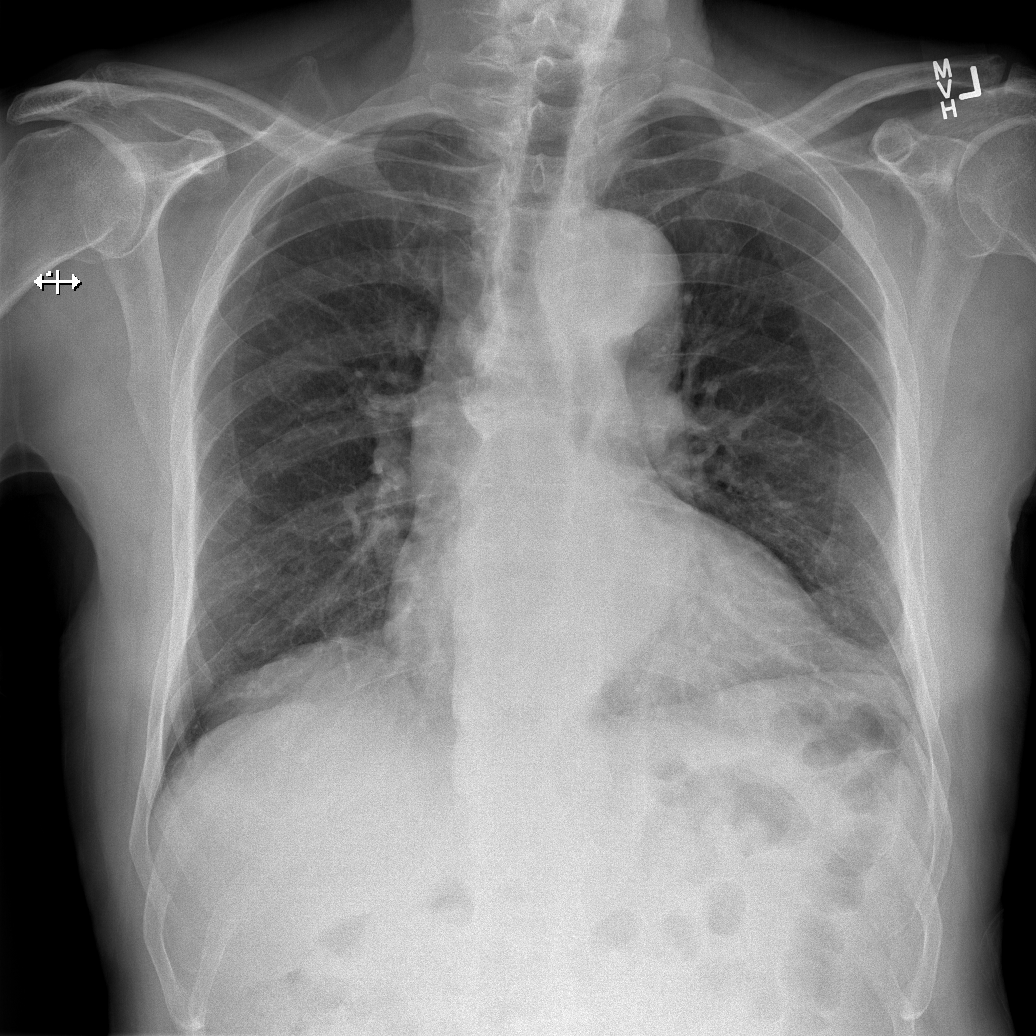

[w ribs ap upper right]
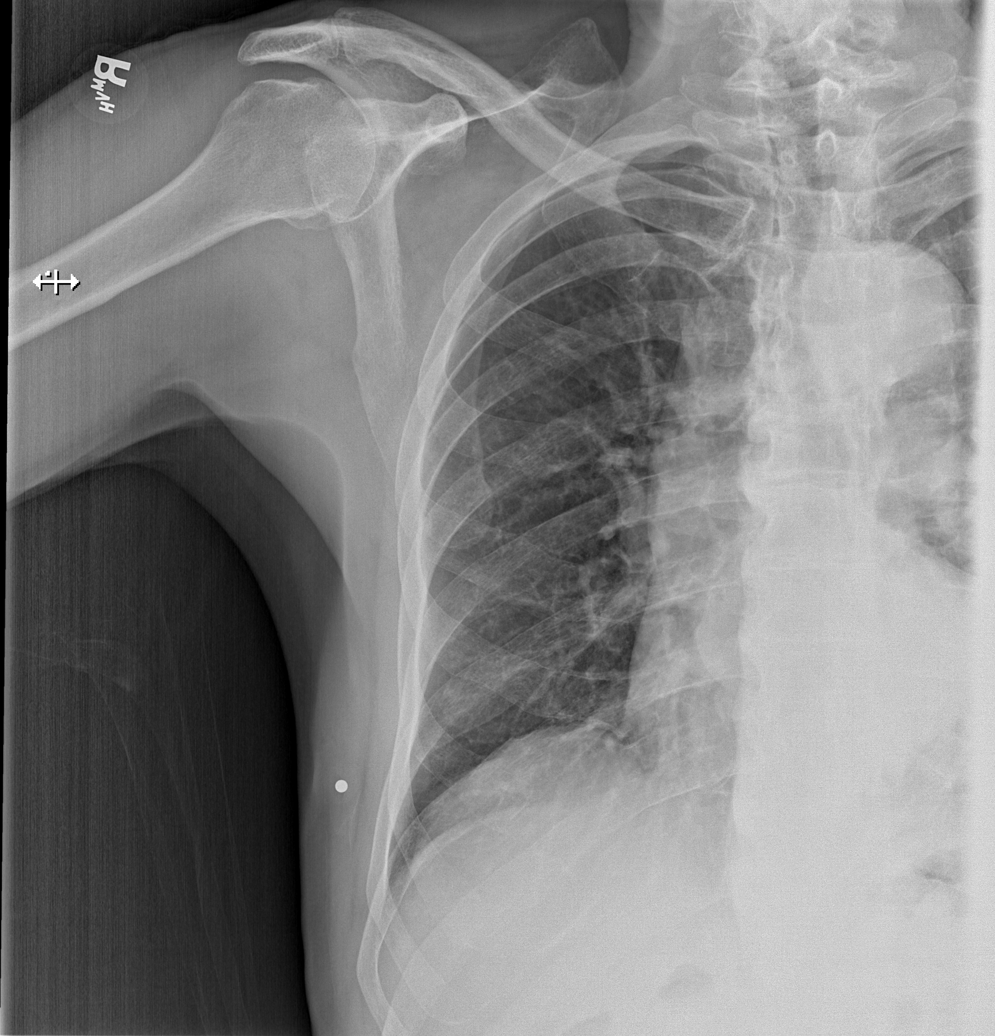

[w ribs ap lower right]
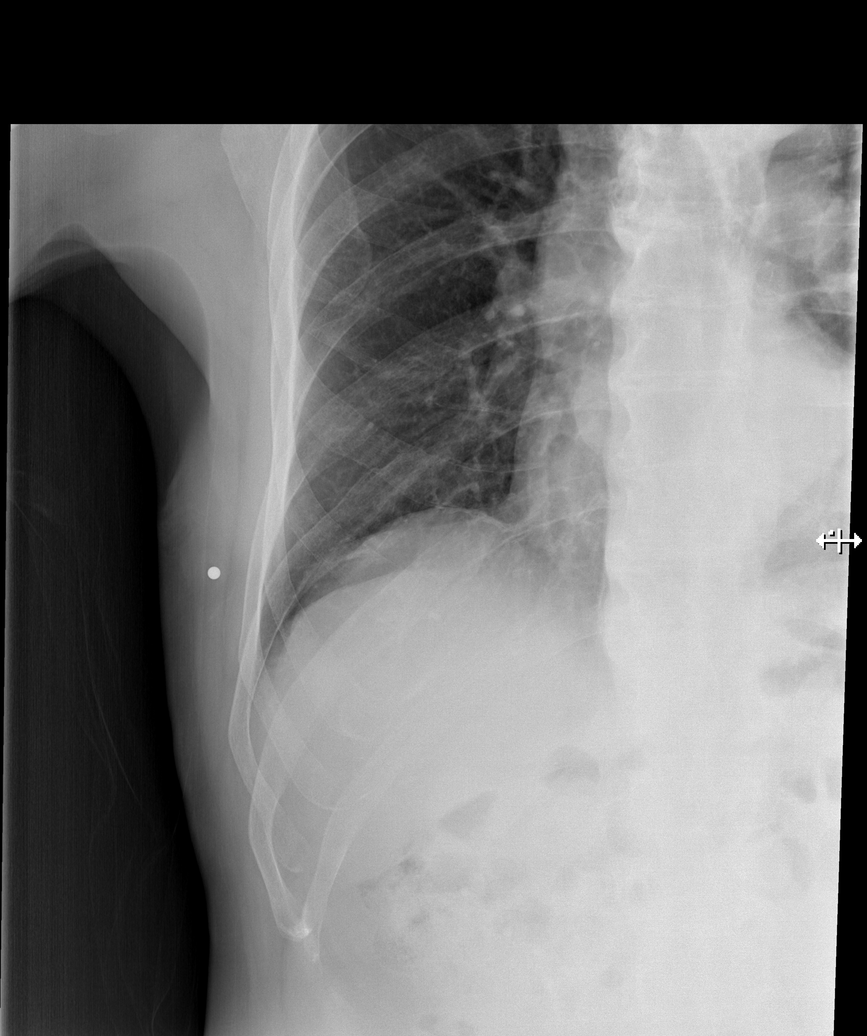

[w ribs obl right (1 of 2)]
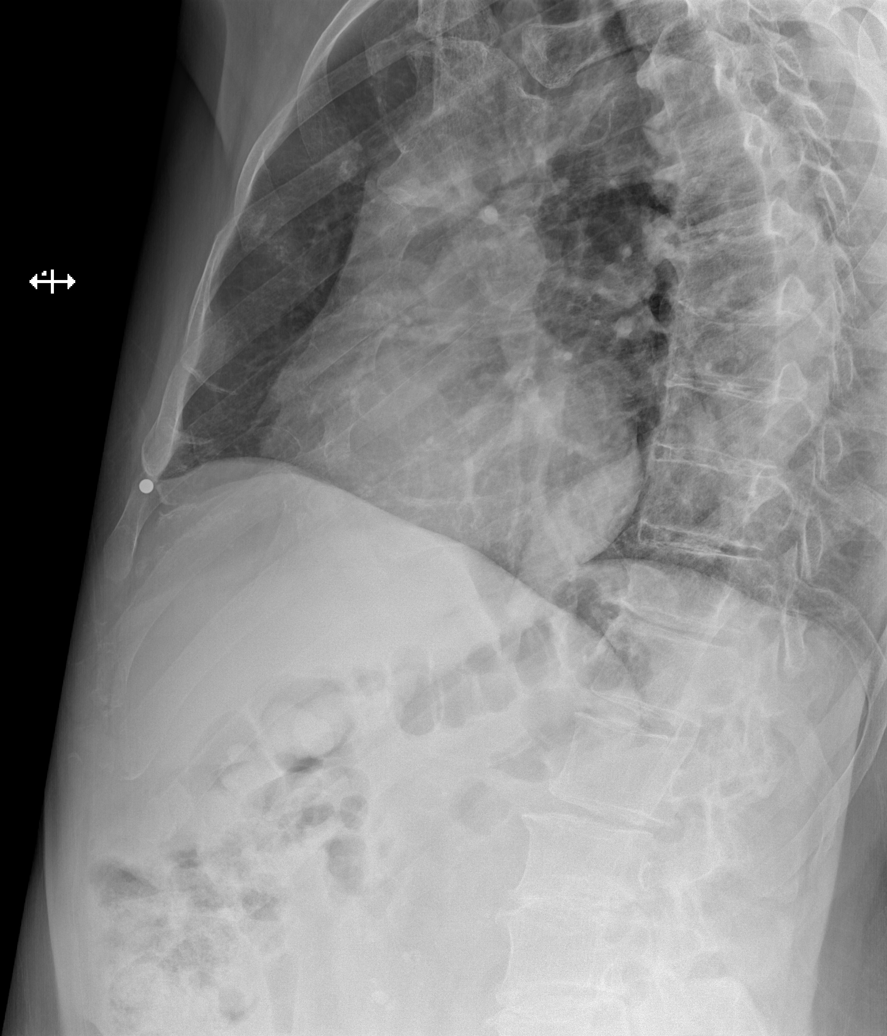

[w ribs obl right (2 of 2)]
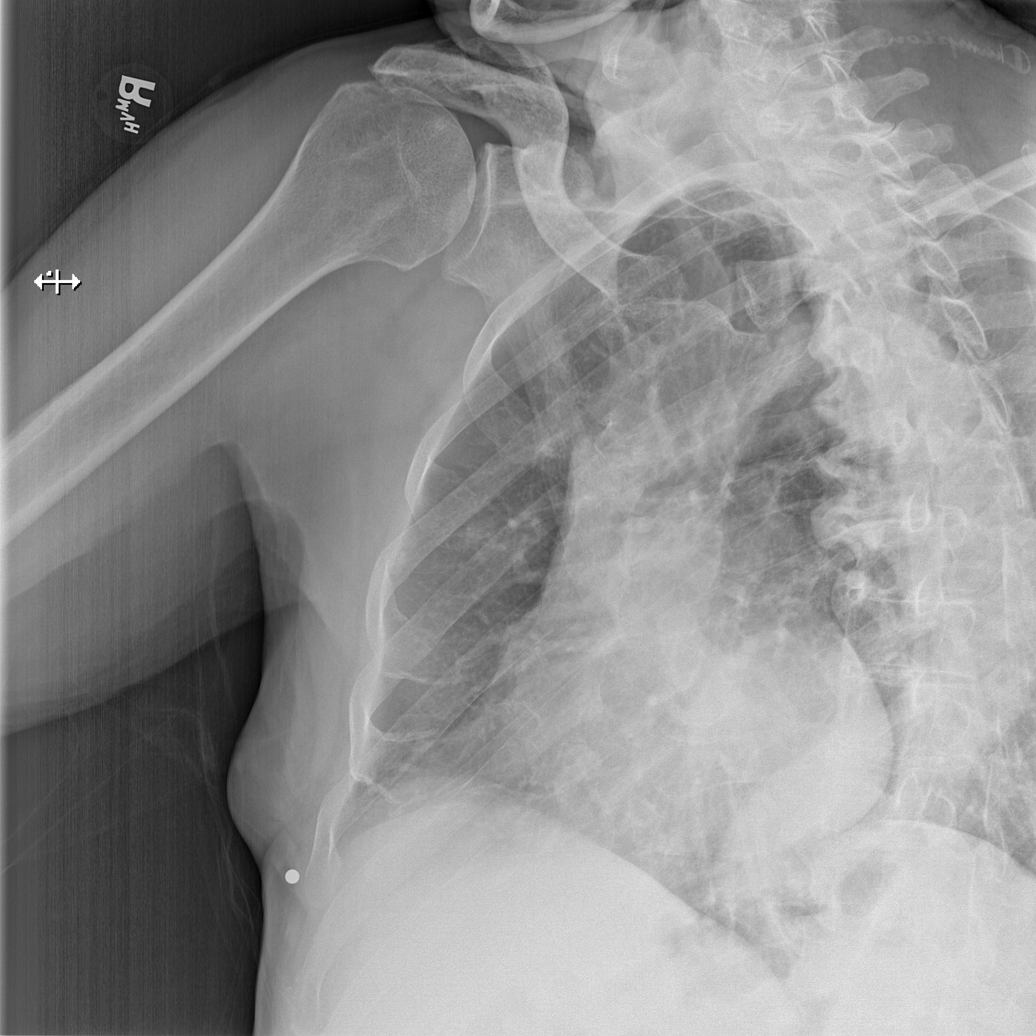

[5 of 5 positions shown; findings below may reference images not displayed]

FINDINGS: Normal heart size and vascularity. Aorta is ectatic and tortuous
with atherosclerosis. Trachea is midline. Lungs remain clear. No
focal pneumonia, collapse or consolidation.

Right ribs demonstrate old rib fractures of the lower ribs. No
definite acute displaced rib fracture. No chest wall asymmetry,
hematoma, or subcutaneous emphysema.
IMPRESSION: No definite acute finding by plain radiography

Aortic atherosclerosis, ectasia, and tortuosity, slightly increased
compared 2334.

## 2019-03-24 DIAGNOSIS — Z23 Encounter for immunization: Secondary | ICD-10-CM | POA: Diagnosis not present

## 2019-04-21 DIAGNOSIS — Z23 Encounter for immunization: Secondary | ICD-10-CM | POA: Diagnosis not present

## 2019-08-10 ENCOUNTER — Emergency Department (HOSPITAL_COMMUNITY): Payer: Medicare Other

## 2019-08-10 ENCOUNTER — Other Ambulatory Visit: Payer: Self-pay

## 2019-08-10 ENCOUNTER — Emergency Department (HOSPITAL_COMMUNITY)
Admission: EM | Admit: 2019-08-10 | Discharge: 2019-08-10 | Disposition: A | Discharge status: Home / Self Care | Payer: Medicare Other | Attending: Emergency Medicine | Admitting: Emergency Medicine

## 2019-08-10 ENCOUNTER — Encounter (HOSPITAL_COMMUNITY): Payer: Self-pay | Admitting: Emergency Medicine

## 2019-08-10 DIAGNOSIS — R0602 Shortness of breath: Secondary | ICD-10-CM | POA: Insufficient documentation

## 2019-08-10 DIAGNOSIS — R519 Headache, unspecified: Secondary | ICD-10-CM | POA: Diagnosis not present

## 2019-08-10 DIAGNOSIS — I160 Hypertensive urgency: Secondary | ICD-10-CM | POA: Insufficient documentation

## 2019-08-10 DIAGNOSIS — Z85841 Personal history of malignant neoplasm of brain: Secondary | ICD-10-CM | POA: Insufficient documentation

## 2019-08-10 DIAGNOSIS — R42 Dizziness and giddiness: Secondary | ICD-10-CM | POA: Insufficient documentation

## 2019-08-10 DIAGNOSIS — R110 Nausea: Secondary | ICD-10-CM | POA: Insufficient documentation

## 2019-08-10 DIAGNOSIS — M549 Dorsalgia, unspecified: Secondary | ICD-10-CM | POA: Diagnosis not present

## 2019-08-10 DIAGNOSIS — Z7982 Long term (current) use of aspirin: Secondary | ICD-10-CM | POA: Insufficient documentation

## 2019-08-10 DIAGNOSIS — I10 Essential (primary) hypertension: Secondary | ICD-10-CM | POA: Diagnosis not present

## 2019-08-10 DIAGNOSIS — R1030 Lower abdominal pain, unspecified: Secondary | ICD-10-CM | POA: Insufficient documentation

## 2019-08-10 DIAGNOSIS — Z79899 Other long term (current) drug therapy: Secondary | ICD-10-CM | POA: Diagnosis not present

## 2019-08-10 DIAGNOSIS — I493 Ventricular premature depolarization: Secondary | ICD-10-CM

## 2019-08-10 DIAGNOSIS — R531 Weakness: Secondary | ICD-10-CM | POA: Diagnosis present

## 2019-08-10 DIAGNOSIS — R5383 Other fatigue: Secondary | ICD-10-CM | POA: Diagnosis not present

## 2019-08-10 LAB — URINALYSIS, ROUTINE W REFLEX MICROSCOPIC
Bacteria, UA: NONE SEEN
Bilirubin Urine: NEGATIVE
Glucose, UA: NEGATIVE mg/dL
Hgb urine dipstick: NEGATIVE
Ketones, ur: NEGATIVE mg/dL
Leukocytes,Ua: NEGATIVE
Nitrite: NEGATIVE
Protein, ur: NEGATIVE mg/dL
Specific Gravity, Urine: 1.011 (ref 1.005–1.030)
pH: 7 (ref 5.0–8.0)

## 2019-08-10 LAB — BASIC METABOLIC PANEL
Anion gap: 12 (ref 5–15)
BUN: 22 mg/dL (ref 8–23)
CO2: 26 mmol/L (ref 22–32)
Calcium: 9.8 mg/dL (ref 8.9–10.3)
Chloride: 102 mmol/L (ref 98–111)
Creatinine, Ser: 1.02 mg/dL (ref 0.61–1.24)
GFR calc Af Amer: >60 mL/min (ref >60)
GFR calc non Af Amer: >60 mL/min (ref >60)
Glucose, Bld: 98 mg/dL (ref 70–99)
Potassium: 4.8 mmol/L (ref 3.5–5.1)
Sodium: 140 mmol/L (ref 135–145)

## 2019-08-10 LAB — CBC
HCT: 45.6 % (ref 39.0–52.0)
Hemoglobin: 15.2 g/dL (ref 13.0–17.0)
MCH: 33 pg (ref 26.0–34.0)
MCHC: 33.3 g/dL (ref 30.0–36.0)
MCV: 98.9 fL (ref 80.0–100.0)
Platelets: 298 10*3/uL (ref 150–400)
RBC: 4.61 MIL/uL (ref 4.22–5.81)
RDW: 12.7 % (ref 11.5–15.5)
WBC: 7 10*3/uL (ref 4.0–10.5)
nRBC: 0 % (ref 0.0–0.2)

## 2019-08-10 LAB — CBG MONITORING, ED: Glucose-Capillary: 96 mg/dL (ref 70–99)

## 2019-08-10 MED ORDER — SODIUM CHLORIDE (PF) 0.9 % IJ SOLN
INTRAMUSCULAR | Status: AC
  Filled 2019-08-10: qty 50

## 2019-08-10 MED ORDER — SODIUM CHLORIDE 0.9 % IV BOLUS
1000.0000 mL | Freq: Once | INTRAVENOUS | Status: AC
  Administered 2019-08-10: 1000 mL via INTRAVENOUS

## 2019-08-10 MED ORDER — SODIUM CHLORIDE 0.9% FLUSH: 3.0000 mL | Freq: Once | INTRAVENOUS | Status: DC

## 2019-08-10 MED ORDER — LISINOPRIL 10 MG PO TABS
10.0000 mg | ORAL_TABLET | Freq: Once | ORAL | Status: AC
  Administered 2019-08-10: 10 mg via ORAL
  Filled 2019-08-10: qty 1

## 2019-08-10 MED ORDER — METOCLOPRAMIDE HCL 5 MG/ML IJ SOLN
10.0000 mg | Freq: Once | INTRAMUSCULAR | Status: AC
  Administered 2019-08-10: 10 mg via INTRAVENOUS
  Filled 2019-08-10: qty 2

## 2019-08-10 MED ORDER — KETOROLAC TROMETHAMINE 15 MG/ML IJ SOLN
15.0000 mg | Freq: Once | INTRAMUSCULAR | Status: AC
  Administered 2019-08-10: 15 mg via INTRAVENOUS
  Filled 2019-08-10: qty 1

## 2019-08-10 MED ORDER — LISINOPRIL 5 MG PO TABS: ORAL_TABLET | ORAL | 0 refills | Status: DC

## 2019-08-10 MED ORDER — MECLIZINE HCL 25 MG PO TABS: 25.0000 mg | ORAL_TABLET | Freq: Three times a day (TID) | ORAL | 0 refills | Status: DC | PRN

## 2019-08-10 MED ORDER — CARVEDILOL 3.125 MG PO TABS: 3.1250 mg | ORAL_TABLET | Freq: Two times a day (BID) | ORAL | 0 refills | Status: DC

## 2019-08-10 MED ORDER — IOHEXOL 350 MG/ML SOLN
75.0000 mL | Freq: Once | INTRAVENOUS | Status: AC | PRN
  Administered 2019-08-10: 75 mL via INTRAVENOUS

## 2019-08-10 NOTE — ED Provider Notes (Signed)
[facility] DEPT Provider Note   CSN: [id number] Arrival date & time: 08/10/19  1300     History Chief Complaint  Patient presents with   Fatigue   pain   Headache    [name] is a 79 y.o. male.  The history is provided by the patient and medical records. No language interpreter was used.  Headache    79 year old male with remote history of benign brain tumor status post resection, kidney stone, fibromyalgia, hypertension, presenting with complaints of generalized weakness.  Patient report for the past 2 weeks he has had recurrent headache.  Headache is described and uncomfortable sensation to his forehead, waxing waning, and mild at this time.  He normally does not have headache so this headache is new.  He endorsed some ringing sensation in his ears but that has been ongoing for several months.  He also endorsed some lightheadedness and occasional bouts of nausea.  He endorsed generalized fatigue and feeling as if he is going to sleep.  States he gets tired from simple exertion such as walking up the steps but denies any associated shortness of breath with that.  He also endorsed increased urination at nighttime.  He endorsed some lower abdominal discomfort from hernia that he has for "quite a while".  He endorsed history of fibromyalgias and has had recurrent back pain which is not new.  He normally takes Advil daily without adequate relief of his symptoms.  He denies any exertional chest pain, fever, abnormal weight loss, or night sweats.  He denies any focal numbness or focal weakness.  He is a social drinker but denies alcohol abuse or tobacco abuse.  He has had his Covid vaccination.   Past Medical History:  Diagnosis Date   Arthritis    HANDS AND PT STATES HIS ARMS HURT   Brain tumor [facility])    age 29 - brain tumor removed left- benign - occas slight headaches since the surgery   Colonic mass 2001   Fibromyalgia 1990's    Frequent  PVCs    H/O inguinal hernia    right side   Headache(784.0)    Hernia, umbilical    History of kidney stones    HX OF MULTIPLE KIDNEY STONES AND  CURRENTLY HAS BILATERAL RENAL STONES CAUSING ABDOMINAL AND BACK PAIN   Hypertension    high blood pressure readings    NICM (nonischemic cardiomyopathy) (HCC)    NSVT (nonsustained ventricular tachycardia) (HCC)    Premature atrial contractions    Prolonged Q-T interval on ECG     Patient Active Problem List   Diagnosis Date Noted   Frequent PVCs    NSVT (nonsustained ventricular tachycardia) (HCC)    Premature atrial contractions    Systolic dysfunction    Essential hypertension 02/03/2015   Ectopic beat, ventricular 02/03/2015    Past Surgical History:  Procedure Laterality Date   BRAIN TUMOR EXCISION     CARDIAC CATHETERIZATION N/A 04/09/2015   Procedure: Left Heart Cath and Coronary Angiography;  Surgeon: Burnell Blanks, MD;  Location: [facility];  Service: Cardiovascular;  Laterality: N/A;   COLON SURGERY  2001   COLON RESECTION FOR GROWTH - NOT CANCER   CYSTOSCOPY WITH RETROGRADE PYELOGRAM, URETEROSCOPY AND STENT PLACEMENT Left 10/05/2012   Procedure: CYSTOSCOPY WITH RETROGRADE PYELOGRAM, URETEROSCOPY AND STENT PLACEMENT;  Surgeon: Alexis Frock, MD;  Location: [facility];  Service: Urology;  Laterality: Left;   CYSTOSCOPY/RETROGRADE/URETEROSCOPY/STONE EXTRACTION WITH BASKET Right 10/05/2012  Procedure: CYSTOSCOPY/RETROGRADE/URETEROSCOPY/STONE EXTRACTION WITH BASKET;  Surgeon: Alexis Frock, MD;  Location: [facility];  Service: Urology;  Laterality: Right;   CYSTOSCOPY/RETROGRADE/URETEROSCOPY/STONE EXTRACTION WITH BASKET Bilateral 10/24/2012   Procedure: CYSTOSCOPY/RETROGRADE/URETEROSCOPY/STONE EXTRACTION WITH BASKET;  Surgeon: Alexis Frock, MD;  Location: [facility];  Service: Urology;  Laterality: Bilateral;  with bilateral stent placement   HOLMIUM LASER APPLICATION Right 3/35/4562   Procedure:  HOLMIUM LASER APPLICATION;  Surgeon: Alexis Frock, MD;  Location: [facility];  Service: Urology;  Laterality: Right;   HOLMIUM LASER APPLICATION Bilateral 5/63/8937   Procedure: HOLMIUM LASER APPLICATION;  Surgeon: Alexis Frock, MD;  Location: [facility];  Service: Urology;  Laterality: Bilateral;   LITHOTRIPSY     TONSILLECTOMY         Family History  Problem Relation Age of Onset   Colon cancer Father    Heart disease Father        quad bypass    Social History   Tobacco Use   Smoking status: Never Smoker   Smokeless tobacco: Never Used  Vaping Use   Vaping Use: Never used  Substance Use Topics   Alcohol use: Yes    Alcohol/week: 7.0 standard drinks    Types: 7 Standard drinks or equivalent per week   Drug use: Yes    Types: Amyl nitrate    Home Medications Prior to Admission medications   Medication Sig Start Date End Date Taking? Authorizing Provider  aspirin EC 81 MG tablet Take 1 tablet (81 mg total) by mouth daily. 04/02/15   Dorothy Spark, MD  Bioflavonoid Products (BIOFLEX PO) Take 2 tablets by mouth daily.     [provider]  carvedilol (COREG) 3.125 MG tablet TAKE 1 TABLET (3.125 MG TOTAL) BY MOUTH 2 (TWO) TIMES DAILY. 08/28/18 11/26/18  Nafziger, Tommi Rumps, NP  citalopram (CELEXA) 40 MG tablet TAKE 1 TABLET BY MOUTH EVERY DAY 07/27/18   Nafziger, Tommi Rumps, NP  Cranberry 1000 MG CAPS Take 1,000 mg by mouth daily.     [provider]  ibuprofen (ADVIL,MOTRIN) 200 MG tablet Take 600 mg by mouth 2 (two) times daily. Take 3 pills in am and 3 pills at bedtime    [provider]  lisinopril (ZESTRIL) 5 MG tablet TAKE 1 TABLET BY MOUTH EVERY DAY 08/28/18   Nafziger, Tommi Rumps, NP  Multiple Vitamin (MULTIVITAMIN) tablet Take 1 tablet by mouth daily.    [provider]    Allergies    Other, Prozac [fluoxetine hcl], Sulfa antibiotics, Vicodin [hydrocodone-acetaminophen], and Erythromycin  Review of Systems   Review of Systems    Neurological: Positive for headaches.  All other systems reviewed and are negative.   Physical Exam Updated Vital Signs BP (!) 190/105 (BP Location: Left Arm)    Pulse (!) 56    Temp 97.6 F (36.4 C) (Oral)    Resp 17    SpO2 99%   Physical Exam Vitals and nursing note reviewed.  Constitutional:      General: He is not in acute distress.    Appearance: He is well-developed.  HENT:     Head: Atraumatic.     Mouth/Throat:     Mouth: Mucous membranes are moist.  Eyes:     Extraocular Movements: Extraocular movements intact.     Conjunctiva/sclera: Conjunctivae normal.     Pupils: Pupils are equal, round, and reactive to light.  Cardiovascular:     Rate and Rhythm: Normal rate and regular rhythm.     Heart sounds: Normal heart sounds. No murmur  heard.  No friction rub. No gallop.   Pulmonary:     Effort: Pulmonary effort is normal.     Breath sounds: Normal breath sounds. No wheezing, rhonchi or rales.  Abdominal:     Palpations: Abdomen is soft.     Tenderness: There is no abdominal tenderness.     Comments: Soft umbilical hernia easily reducible.  Soft right inguinal hernia easily reducible.  Musculoskeletal:        General: No swelling. Normal range of motion.     Cervical back: Normal range of motion and neck supple. No rigidity.  Skin:    Findings: No rash.  Neurological:     Mental Status: He is alert and oriented to person, place, and time.     GCS: GCS eye subscore is 4. GCS verbal subscore is 5. GCS motor subscore is 6.     Sensory: No sensory deficit.     Motor: No weakness.  Psychiatric:        Mood and Affect: Mood normal.     ED Results / Procedures / Treatments   Labs (all labs ordered are listed, but only abnormal results are displayed) Labs Reviewed  URINALYSIS, ROUTINE W REFLEX MICROSCOPIC - Abnormal; Notable for the following components:      Result Value   Color, Urine STRAW (*)    All other components within normal limits  BASIC METABOLIC  PANEL  CBC  CBG MONITORING, ED    EKG EKG Interpretation  Date/Time:  Saturday August 10 2019 17:00:32 EDT Ventricular Rate:  73 PR Interval:    QRS Duration: 97 QT Interval:  469 QTC Calculation: 428 R Axis:   -9 Text Interpretation: Sinus rhythm Multiple premature complexes, vent & supraven No acute changes No significant change since last tracing Confirmed by Varney Biles ([id number]) on 08/10/2019 6:05:49 PM   Radiology CT Head Wo Contrast  Result Date: 08/10/2019 CLINICAL DATA:  79 year old male with headache and dizziness. EXAM: CT HEAD WITHOUT CONTRAST CT CERVICAL SPINE WITHOUT CONTRAST TECHNIQUE: Multidetector CT imaging of the head and cervical spine was performed following the standard protocol without intravenous contrast. Multiplanar CT image reconstructions of the cervical spine were also generated. COMPARISON:  Head CT dated 02/15/2018. FINDINGS: CT HEAD FINDINGS Brain: Mild age-related atrophy and chronic microvascular ischemic changes. There is no acute intracranial hemorrhage. No mass effect or midline shift. No extra-axial fluid collection. Vascular: No hyperdense vessel or unexpected calcification. Skull: No acute calvarial pathology. Minimal broad-based protrusion of the left frontal calvarium, possibly a small exostosis. Sinuses/Orbits: No acute finding. Other: None CT CERVICAL SPINE FINDINGS Alignment: No acute subluxation. There is mild reversal of normal cervical lordosis, likely chronic muscle spasm or degenerative changes. Skull base and vertebrae: No acute fracture. Soft tissues and spinal canal: No prevertebral fluid or swelling. No visible canal hematoma. Disc levels: Multilevel degenerative changes with disc space narrowing and endplate irregularity. Upper chest: Negative. Other: Left carotid bulb calcified plaques. IMPRESSION: 1. No acute intracranial pathology. Mild age-related atrophy and chronic microvascular ischemic changes. 2. No acute/traumatic cervical spine  pathology. Multilevel degenerative changes. Electronically Signed   By: Anner Crete M.D.   On: 08/10/2019 16:59   CT Cervical Spine Wo Contrast  Result Date: 08/10/2019 CLINICAL DATA:  79 year old male with headache and dizziness. EXAM: CT HEAD WITHOUT CONTRAST CT CERVICAL SPINE WITHOUT CONTRAST TECHNIQUE: Multidetector CT imaging of the head and cervical spine was performed following the standard protocol without intravenous contrast. Multiplanar CT image reconstructions of the cervical spine  were also generated. COMPARISON:  Head CT dated 02/15/2018. FINDINGS: CT HEAD FINDINGS Brain: Mild age-related atrophy and chronic microvascular ischemic changes. There is no acute intracranial hemorrhage. No mass effect or midline shift. No extra-axial fluid collection. Vascular: No hyperdense vessel or unexpected calcification. Skull: No acute calvarial pathology. Minimal broad-based protrusion of the left frontal calvarium, possibly a small exostosis. Sinuses/Orbits: No acute finding. Other: None CT CERVICAL SPINE FINDINGS Alignment: No acute subluxation. There is mild reversal of normal cervical lordosis, likely chronic muscle spasm or degenerative changes. Skull base and vertebrae: No acute fracture. Soft tissues and spinal canal: No prevertebral fluid or swelling. No visible canal hematoma. Disc levels: Multilevel degenerative changes with disc space narrowing and endplate irregularity. Upper chest: Negative. Other: Left carotid bulb calcified plaques. IMPRESSION: 1. No acute intracranial pathology. Mild age-related atrophy and chronic microvascular ischemic changes. 2. No acute/traumatic cervical spine pathology. Multilevel degenerative changes. Electronically Signed   By: Anner Crete M.D.   On: 08/10/2019 16:59    Procedures Procedures (including critical care time)  Medications Ordered in ED Medications  sodium chloride flush (NS) 0.9 % injection 3 mL (has no administration in time range)   lisinopril (ZESTRIL) tablet 10 mg (10 mg Oral Given 08/10/19 1656)  sodium chloride 0.9 % bolus 1,000 mL ( Intravenous Restarted 08/10/19 1711)    ED Course  I have reviewed the triage vital signs and the nursing notes.  Pertinent labs & imaging results that were available during my care of the patient were reviewed by me and considered in my medical decision making (see chart for details).    MDM Rules/Calculators/A&P                          BP (!) 190/105 (BP Location: Left Arm)    Pulse (!) 56    Temp 97.6 F (36.4 C) (Oral)    Resp 17    Ht 6' (1.829 m)    Wt 79.4 kg    SpO2 99%    BMI 23.73 kg/m   Final Clinical Impression(s) / ED Diagnoses Final diagnoses:  Recurrent headache  Hypertensive urgency  Fatigue, unspecified type    Rx / DC Orders ED Discharge Orders         Ordered    carvedilol (COREG) 3.125 MG tablet  2 times daily     Discontinue  Reprint     08/10/19 1808    lisinopril (ZESTRIL) 5 MG tablet     Discontinue  Reprint     08/10/19 1808         4:28 PM Patient with history of fibromyalgia here complaining headache ongoing for the past 2 weeks as well as generalized fatigue.  He has history of hypertension but does not take any blood pressure medication.  Patient found to be hypertensive with initial blood pressure of 190/105.  We will give him his home blood pressure medication.  Headache is nonfocal.  Low suspicion for meningitis, subarachnoid hemorrhage, or space-occupying lesion.  But in the setting of advanced age and new onset headache, will obtain head CT scan.  Labs are reassuring, normal H&H, normal WBC, normal CBG, electrolyte panels are reassuring, kidney functions are normal.  We will also check UA, orthostatic vital sign, and obtain labs.  5:52 PM Head and cervical spine CT obtained showed no acute intracranial pathology.  No signs of acute traumatic cervical spine pathology.  No evidence of multilevel degenerative changes noted in the cervical  spine as well as mild age-related atrophy and chronic microvascular ischemic changes in the head CT scan.  These are not likely related to his current presentation.  And orthostatic vital signs obtained which was normal.  Patient is hypertensive.  Will recheck blood pressure.  Plan to discharge home with his blood pressure medication as he has not been taking it for a period of time.  Care discussed with Dr. Kathrynn Humble.  Anticipate pt can be discharge with refill of his BP medications and outpt f/u.    The cardiac monitor revealed sinus rhythm with PVC as interpreted by me. The cardiac monitor was ordered secondary to the patients history of HTN and to monitor the patient for dysrhythmia  6:20 PM Pt Nanavati have evaluated pt and recommend brain MRI to r/o posterior circulation stroke as pt does have nystagmus on exam.  MRI ordered.  Pt sign out to DR. Nanavati who will f/u on result.    Domenic Moras, PA-C 08/11/19 [id number]    Varney Biles, MD 08/11/19 1431

## 2019-08-10 NOTE — ED Triage Notes (Signed)
Patient here from home reporting fatigue, generalizing body aches. Reports that he has fibromyalgia and "that may have something to do with it". Also reports headache.

## 2019-08-10 NOTE — ED Notes (Signed)
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

## 2019-08-10 NOTE — Discharge Instructions (Signed)
You have been evaluated for your headache and your generalized fatigue.  Fortunately your head CT scan did not show any concerning finding.  Your blood pressure is elevated today likely due to lack of blood pressure medication treatment.  I have refilled your blood pressure medications, please take as prescribed.  Follow-up closely with your primary care doctor for a recheck.  Your labs are reassuring today.  No evidence of any infection.  Return to the ER if you have any other concern.

## 2019-09-10 ENCOUNTER — Other Ambulatory Visit: Payer: Self-pay

## 2019-09-10 ENCOUNTER — Encounter: Payer: Self-pay | Admitting: Adult Health

## 2019-09-10 ENCOUNTER — Ambulatory Visit (INDEPENDENT_AMBULATORY_CARE_PROVIDER_SITE_OTHER): Payer: Medicare Other | Admitting: Adult Health

## 2019-09-10 VITALS — BP 140/92 | Temp 98.0°F | Wt 173.0 lb

## 2019-09-10 DIAGNOSIS — F419 Anxiety disorder, unspecified: Secondary | ICD-10-CM

## 2019-09-10 DIAGNOSIS — I10 Essential (primary) hypertension: Secondary | ICD-10-CM

## 2019-09-10 DIAGNOSIS — R42 Dizziness and giddiness: Secondary | ICD-10-CM

## 2019-09-10 MED ORDER — LISINOPRIL 10 MG PO TABS: ORAL_TABLET | ORAL | 1 refills | Status: DC

## 2019-09-10 MED ORDER — CITALOPRAM HYDROBROMIDE 40 MG PO TABS: 40.0000 mg | ORAL_TABLET | Freq: Every day | ORAL | 1 refills | Status: DC

## 2019-09-10 MED ORDER — CARVEDILOL 3.125 MG PO TABS: 3.1250 mg | ORAL_TABLET | Freq: Two times a day (BID) | ORAL | 1 refills | Status: DC

## 2019-09-10 MED ORDER — MECLIZINE HCL 25 MG PO TABS: 25.0000 mg | ORAL_TABLET | Freq: Three times a day (TID) | ORAL | 1 refills | Status: DC | PRN

## 2019-09-10 NOTE — Progress Notes (Signed)
Subjective:    Patient ID: [name], male    DOB: Oct 14, 1940, 79 y.o.   MRN: [id number]  HPI 79 year old male who  has a past medical history of Arthritis, Brain tumor ([place]), Colonic mass (2001), Fibromyalgia (1990's ), Frequent PVCs, H/O inguinal hernia, NGEXBMWU(132.4), Hernia, umbilical, History of kidney stones, Hypertension, NICM (nonischemic cardiomyopathy) ([place]), NSVT (nonsustained ventricular tachycardia) ([place]), Premature atrial contractions, and Prolonged Q-T interval on ECG.  He was seen in the emergency room 30 days ago with a chief complaint of headache and weakness.  He reported recurrent headache for 2 weeks prior to being seen.  This headache was described as an uncomfortable sensation to his forehead, waxing and waning, and mild at the time.  He normally does not have headaches.  He also endorsed some ringing sensation in his ears that had been going on for several months.  He also endorsed generalized fatigue and feeling as though if he was going to sleep.  He was getting tired from simple exertion such as walking up the steps but denied associated shortness of breath.  His blood pressure on presentation was 190/105.  He was not taking his prescribed blood pressure medications.  His labs are reassuring, normal H&H, normal WBC, normal CBG, and normal electrolyte panel.  Head and cervical spine CT obtained showed no acute intracranial pathology.  An MRI was done to rule out stroke due to nystagmus.  The radiologist called and reported that there was some flair suggestive of possible cerebral venous thrombosis, therefore a CT venogram was ordered which was negative.  He reports that he continues to have a slight headache and is still experiencing vertigo on occasion. He was given Meclizine in the ER and reports that this worked well for him.   He is not checking his blood pressure at home.   BP Readings from Last 10 Encounters:  09/10/19 (!) 140/92  08/10/19 (!) 176/89    02/15/18 (!) 147/89  02/09/18 (!) 144/82  10/31/17 140/80  09/27/17 122/64  09/27/17 122/64  09/07/17 (!) 150/76  08/21/17 (!) 142/94  08/15/17 (!) 165/99    He also needs a refill of Celexa  Review of Systems See HPI   Past Medical History:  Diagnosis Date  . Arthritis    HANDS AND PT STATES HIS ARMS HURT  . Brain tumor [facility])    age 55 - brain tumor removed left- benign - occas slight headaches since the surgery  . Colonic mass 2001  . Fibromyalgia 1990's   . Frequent PVCs   . H/O inguinal hernia    right side  . Headache(784.0)   . Hernia, umbilical   . History of kidney stones    HX OF MULTIPLE KIDNEY STONES AND  CURRENTLY HAS BILATERAL RENAL STONES CAUSING ABDOMINAL AND BACK PAIN  . Hypertension    high blood pressure readings   . NICM (nonischemic cardiomyopathy) ([place])   . NSVT (nonsustained ventricular tachycardia) ([place])   . Premature atrial contractions   . Prolonged Q-T interval on ECG     Social History   Socioeconomic History  . Marital status: Single    Spouse name: Not on file  . Number of children: Not on file  . Years of education: Not on file  . Highest education level: Not on file  Occupational History  . Not on file  Tobacco Use  . Smoking status: Never Smoker  . Smokeless tobacco: Never Used  Vaping Use  . Vaping Use: Never  used  Substance and Sexual Activity  . Alcohol use: Yes    Alcohol/week: 7.0 standard drinks    Types: 7 Standard drinks or equivalent per week  . Drug use: Yes    Types: Amyl nitrate  . Sexual activity: Not on file  Other Topics Concern  . Not on file  Social History Narrative   Retired from Museum/gallery curator    Has one son - lives local    Likes to play Tennis   Social Determinants of Health   Financial Resource Strain:   . Difficulty of Paying Living Expenses:   Food Insecurity:   . Worried About Charity fundraiser in the Last Year:   . Arboriculturist in the Last Year:   Transportation Needs:   . Consulting civil engineer (Medical):   Marland Kitchen Lack of Transportation (Non-Medical):   Physical Activity:   . Days of Exercise per Week:   . Minutes of Exercise per Session:   Stress:   . Feeling of Stress :   Social Connections:   . Frequency of Communication with Friends and Family:   . Frequency of Social Gatherings with Friends and Family:   . Attends Religious Services:   . Active Member of Clubs or Organizations:   . Attends Archivist Meetings:   Marland Kitchen Marital Status:   Intimate Partner Violence:   . Fear of Current or Ex-Partner:   . Emotionally Abused:   Marland Kitchen Physically Abused:   . Sexually Abused:     Past Surgical History:  Procedure Laterality Date  . BRAIN TUMOR EXCISION    . CARDIAC CATHETERIZATION N/A 04/09/2015   Procedure: Left Heart Cath and Coronary Angiography;  Surgeon: Burnell Blanks, MD;  Location: [facility];  Service: Cardiovascular;  Laterality: N/A;  . COLON SURGERY  2001   COLON RESECTION FOR GROWTH - NOT CANCER  . CYSTOSCOPY WITH RETROGRADE PYELOGRAM, URETEROSCOPY AND STENT PLACEMENT Left 10/05/2012   Procedure: CYSTOSCOPY WITH RETROGRADE PYELOGRAM, URETEROSCOPY AND STENT PLACEMENT;  Surgeon: Alexis Frock, MD;  Location: [facility];  Service: Urology;  Laterality: Left;  . CYSTOSCOPY/RETROGRADE/URETEROSCOPY/STONE EXTRACTION WITH BASKET Right 10/05/2012   Procedure: CYSTOSCOPY/RETROGRADE/URETEROSCOPY/STONE EXTRACTION WITH BASKET;  Surgeon: Alexis Frock, MD;  Location: [facility];  Service: Urology;  Laterality: Right;  . CYSTOSCOPY/RETROGRADE/URETEROSCOPY/STONE EXTRACTION WITH BASKET Bilateral 10/24/2012   Procedure: CYSTOSCOPY/RETROGRADE/URETEROSCOPY/STONE EXTRACTION WITH BASKET;  Surgeon: Alexis Frock, MD;  Location: [facility];  Service: Urology;  Laterality: Bilateral;  with bilateral stent placement  . HOLMIUM LASER APPLICATION Right 9/98/3382   Procedure: HOLMIUM LASER APPLICATION;  Surgeon: Alexis Frock, MD;  Location: [facility];  Service: Urology;   Laterality: Right;  . HOLMIUM LASER APPLICATION Bilateral 06/12/3974   Procedure: HOLMIUM LASER APPLICATION;  Surgeon: Alexis Frock, MD;  Location: [facility];  Service: Urology;  Laterality: Bilateral;  . LITHOTRIPSY    . TONSILLECTOMY      Family History  Problem Relation Age of Onset  . Colon cancer Father   . Heart disease Father        quad bypass    Allergies  Allergen Reactions  . Other Other (See Comments)    /mangos/caused a severe rash  . Prozac [Fluoxetine Hcl]     Made him feel "loopy"  . Sulfa Antibiotics Hives  . Vicodin [Hydrocodone-Acetaminophen] Itching    Tolerates oxycodone  . Erythromycin Rash    Many years ago an oral antibiotic caused a rash(wife and patient think it was erythromycin but they are not positive).  Current Outpatient Medications on File Prior to Visit  Medication Sig Dispense Refill  . aspirin EC 81 MG tablet Take 1 tablet (81 mg total) by mouth daily. 90 tablet 3  . Bioflavonoid Products (BIOFLEX PO) Take 2 tablets by mouth daily.     . Cranberry 1000 MG CAPS Take 1,000 mg by mouth daily.     Marland Kitchen ibuprofen (ADVIL,MOTRIN) 200 MG tablet Take 600 mg by mouth 2 (two) times daily. Take 3 pills in am and 3 pills at bedtime    . Multiple Vitamin (MULTIVITAMIN) tablet Take 1 tablet by mouth daily.     No current facility-administered medications on file prior to visit.    BP (!) 140/92   Temp 98 F (36.7 C)   Wt 173 lb (78.5 kg)   BMI 23.46 kg/m       Objective:   Physical Exam Vitals and nursing note reviewed.  Constitutional:      General: He is not in acute distress.    Appearance: Normal appearance. He is well-developed and normal weight.  HENT:     Head: Normocephalic and atraumatic.     Right Ear: Tympanic membrane, ear canal and external ear normal. There is no impacted cerumen.     Left Ear: Tympanic membrane, ear canal and external ear normal. There is no impacted cerumen.     Nose: Nose normal. No congestion or rhinorrhea.       Mouth/Throat:     Mouth: Mucous membranes are moist.     Pharynx: Oropharynx is clear. No oropharyngeal exudate or posterior oropharyngeal erythema.  Eyes:     General:        Right eye: No discharge.        Left eye: No discharge.     Extraocular Movements: Extraocular movements intact.     Conjunctiva/sclera: Conjunctivae normal.     Pupils: Pupils are equal, round, and reactive to light.  Neck:     Vascular: No carotid bruit.     Trachea: No tracheal deviation.  Cardiovascular:     Rate and Rhythm: Normal rate and regular rhythm.     Pulses: Normal pulses.     Heart sounds: Normal heart sounds. No murmur heard.  No friction rub. No gallop.   Pulmonary:     Effort: Pulmonary effort is normal. No respiratory distress.     Breath sounds: Normal breath sounds. No stridor. No wheezing, rhonchi or rales.  Chest:     Chest wall: No tenderness.  Musculoskeletal:        General: No swelling, tenderness, deformity or signs of injury. Normal range of motion.     Right lower leg: No edema.     Left lower leg: No edema.  Lymphadenopathy:     Cervical: No cervical adenopathy.  Skin:    General: Skin is warm and dry.     Capillary Refill: Capillary refill takes less than 2 seconds.     Coloration: Skin is not jaundiced or pale.     Findings: No bruising, erythema, lesion or rash.  Neurological:     General: No focal deficit present.     Mental Status: He is alert and oriented to person, place, and time.     Cranial Nerves: No cranial nerve deficit.     Sensory: No sensory deficit.     Motor: No weakness.     Coordination: Coordination normal.     Gait: Gait normal.     Deep Tendon Reflexes: Reflexes normal.  Psychiatric:        Mood and Affect: Mood normal.        Behavior: Behavior normal.        Thought Content: Thought content normal.        Judgment: Judgment normal.       Assessment & Plan:  1. Essential hypertension - will increase Lisinopril to 10 mg  -  Encouraged to monitor BP and home and bring log to CPE  - carvedilol (COREG) 3.125 MG tablet; Take 1 tablet (3.125 mg total) by mouth 2 (two) times daily.  Dispense: 180 tablet; Refill: 1 - lisinopril (ZESTRIL) 10 MG tablet; TAKE 1 TABLET BY MOUTH EVERY DAY  Dispense: 90 tablet; Refill: 1  2. Anxiety  - citalopram (CELEXA) 40 MG tablet; Take 1 tablet (40 mg total) by mouth daily.  Dispense: 90 tablet; Refill: 1  3. Vertigo  - meclizine (ANTIVERT) 25 MG tablet; Take 1 tablet (25 mg total) by mouth 3 (three) times daily as needed for dizziness.  Dispense: 30 tablet; Refill: 1   Dorothyann Peng, NP

## 2019-09-10 NOTE — Patient Instructions (Addendum)
I am going to increase your lisinopril from 5 mg to 10 mg to bring down your blood pressure some more.   I am going to send in your medications for 6 months   Please follow up in about 3 months for a physical exam

## 2019-09-14 ENCOUNTER — Emergency Department (HOSPITAL_COMMUNITY): Payer: Medicare Other

## 2019-09-14 ENCOUNTER — Encounter (HOSPITAL_COMMUNITY): Payer: Self-pay | Admitting: Emergency Medicine

## 2019-09-14 ENCOUNTER — Emergency Department (HOSPITAL_COMMUNITY)
Admission: EM | Admit: 2019-09-14 | Discharge: 2019-09-14 | Disposition: A | Discharge status: Home / Self Care | Payer: Medicare Other | Attending: Emergency Medicine | Admitting: Emergency Medicine

## 2019-09-14 ENCOUNTER — Other Ambulatory Visit: Payer: Self-pay

## 2019-09-14 DIAGNOSIS — I10 Essential (primary) hypertension: Secondary | ICD-10-CM | POA: Diagnosis not present

## 2019-09-14 DIAGNOSIS — R1030 Lower abdominal pain, unspecified: Secondary | ICD-10-CM | POA: Diagnosis not present

## 2019-09-14 DIAGNOSIS — R5383 Other fatigue: Secondary | ICD-10-CM | POA: Diagnosis not present

## 2019-09-14 DIAGNOSIS — M791 Myalgia, unspecified site: Secondary | ICD-10-CM | POA: Diagnosis not present

## 2019-09-14 DIAGNOSIS — Z86011 Personal history of benign neoplasm of the brain: Secondary | ICD-10-CM | POA: Diagnosis not present

## 2019-09-14 DIAGNOSIS — N210 Calculus in bladder: Secondary | ICD-10-CM | POA: Diagnosis not present

## 2019-09-14 DIAGNOSIS — R519 Headache, unspecified: Secondary | ICD-10-CM | POA: Diagnosis not present

## 2019-09-14 DIAGNOSIS — R630 Anorexia: Secondary | ICD-10-CM | POA: Diagnosis not present

## 2019-09-14 DIAGNOSIS — K4091 Unilateral inguinal hernia, without obstruction or gangrene, recurrent: Secondary | ICD-10-CM | POA: Insufficient documentation

## 2019-09-14 DIAGNOSIS — R109 Unspecified abdominal pain: Secondary | ICD-10-CM | POA: Diagnosis present

## 2019-09-14 DIAGNOSIS — Z7982 Long term (current) use of aspirin: Secondary | ICD-10-CM | POA: Diagnosis not present

## 2019-09-14 DIAGNOSIS — Z79899 Other long term (current) drug therapy: Secondary | ICD-10-CM | POA: Insufficient documentation

## 2019-09-14 DIAGNOSIS — Z955 Presence of coronary angioplasty implant and graft: Secondary | ICD-10-CM | POA: Diagnosis not present

## 2019-09-14 DIAGNOSIS — Z20822 Contact with and (suspected) exposure to covid-19: Secondary | ICD-10-CM | POA: Insufficient documentation

## 2019-09-14 DIAGNOSIS — K4090 Unilateral inguinal hernia, without obstruction or gangrene, not specified as recurrent: Secondary | ICD-10-CM | POA: Diagnosis not present

## 2019-09-14 DIAGNOSIS — K439 Ventral hernia without obstruction or gangrene: Secondary | ICD-10-CM | POA: Diagnosis not present

## 2019-09-14 DIAGNOSIS — N200 Calculus of kidney: Secondary | ICD-10-CM | POA: Diagnosis not present

## 2019-09-14 LAB — COMPREHENSIVE METABOLIC PANEL
ALT: 19 U/L (ref 0–44)
AST: 27 U/L (ref 15–41)
Albumin: 4.4 g/dL (ref 3.5–5.0)
Alkaline Phosphatase: 75 U/L (ref 38–126)
Anion gap: 7 (ref 5–15)
BUN: 19 mg/dL (ref 8–23)
CO2: 28 mmol/L (ref 22–32)
Calcium: 9 mg/dL (ref 8.9–10.3)
Chloride: 103 mmol/L (ref 98–111)
Creatinine, Ser: 0.95 mg/dL (ref 0.61–1.24)
GFR calc Af Amer: >60 mL/min (ref >60)
GFR calc non Af Amer: >60 mL/min (ref >60)
Glucose, Bld: 115 mg/dL — ABNORMAL HIGH (ref 70–99)
Potassium: 4.5 mmol/L (ref 3.5–5.1)
Sodium: 138 mmol/L (ref 135–145)
Total Bilirubin: 1 mg/dL (ref 0.3–1.2)
Total Protein: 7 g/dL (ref 6.5–8.1)

## 2019-09-14 LAB — LIPASE, BLOOD: Lipase: 33 U/L (ref 11–51)

## 2019-09-14 LAB — URINALYSIS, ROUTINE W REFLEX MICROSCOPIC
Bilirubin Urine: NEGATIVE
Glucose, UA: NEGATIVE mg/dL
Hgb urine dipstick: NEGATIVE
Ketones, ur: NEGATIVE mg/dL
Leukocytes,Ua: NEGATIVE
Nitrite: NEGATIVE
Protein, ur: NEGATIVE mg/dL
Specific Gravity, Urine: 1.006 (ref 1.005–1.030)
pH: 7 (ref 5.0–8.0)

## 2019-09-14 LAB — CBC
HCT: 44.8 % (ref 39.0–52.0)
Hemoglobin: 15.1 g/dL (ref 13.0–17.0)
MCH: 33.6 pg (ref 26.0–34.0)
MCHC: 33.7 g/dL (ref 30.0–36.0)
MCV: 99.8 fL (ref 80.0–100.0)
Platelets: 287 10*3/uL (ref 150–400)
RBC: 4.49 MIL/uL (ref 4.22–5.81)
RDW: 12.3 % (ref 11.5–15.5)
WBC: 9.6 10*3/uL (ref 4.0–10.5)
nRBC: 0 % (ref 0.0–0.2)

## 2019-09-14 LAB — SARS CORONAVIRUS 2 BY RT PCR (HOSPITAL ORDER, PERFORMED IN ~~LOC~~ HOSPITAL LAB): SARS Coronavirus 2: NEGATIVE

## 2019-09-14 MED ORDER — MORPHINE SULFATE (PF) 4 MG/ML IV SOLN
4.0000 mg | Freq: Once | INTRAVENOUS | Status: AC
  Administered 2019-09-14: 4 mg via INTRAVENOUS
  Filled 2019-09-14: qty 1

## 2019-09-14 MED ORDER — SODIUM CHLORIDE (PF) 0.9 % IJ SOLN
INTRAMUSCULAR | Status: AC
  Filled 2019-09-14: qty 50

## 2019-09-14 MED ORDER — METOCLOPRAMIDE HCL 5 MG/ML IJ SOLN
10.0000 mg | Freq: Once | INTRAMUSCULAR | Status: AC
  Administered 2019-09-14: 10 mg via INTRAVENOUS
  Filled 2019-09-14: qty 2

## 2019-09-14 MED ORDER — ONDANSETRON HCL 4 MG/2ML IJ SOLN
4.0000 mg | Freq: Once | INTRAMUSCULAR | Status: AC
  Administered 2019-09-14: 4 mg via INTRAVENOUS
  Filled 2019-09-14: qty 2

## 2019-09-14 MED ORDER — IOHEXOL 300 MG/ML  SOLN
100.0000 mL | Freq: Once | INTRAMUSCULAR | Status: AC | PRN
  Administered 2019-09-14: 100 mL via INTRAVENOUS

## 2019-09-14 MED ORDER — SODIUM CHLORIDE 0.9 % IV BOLUS
1000.0000 mL | Freq: Once | INTRAVENOUS | Status: AC
  Administered 2019-09-14: 1000 mL via INTRAVENOUS

## 2019-09-14 MED ORDER — ONDANSETRON 4 MG PO TBDP
4.0000 mg | ORAL_TABLET | Freq: Three times a day (TID) | ORAL | 0 refills | Status: DC | PRN
Start: 2019-09-14 — End: 2020-02-19

## 2019-09-14 MED ORDER — SODIUM CHLORIDE 0.9% FLUSH
3.0000 mL | Freq: Once | INTRAVENOUS | Status: AC
  Administered 2019-09-14: 3 mL via INTRAVENOUS

## 2019-09-14 NOTE — ED Provider Notes (Signed)
[facility] DEPT Provider Note   CSN: [id number] Arrival date & time: 09/14/19  1209    History Chief Complaint  Patient presents with  . Abdominal Pain  . Nausea  . Emesis   CALDWELL KRONENBERGER is a 79 y.o. male with past medical history significant for fibromyalgia, arthritis, kidney stones, hypertension, nonischemic cardiomyopathy, SVT, prolonged QT interval who presents for evaluation multiple complaints. Patient states he has had generalized abdominal pain however worse to his lower quadrants over the last week. He reports multiple episodes of NBNB emesis as well as persistent nausea. States he thinks has been passing some kidney stones. His pain is primarily located over his suprapubic region. No hematuria or dysuria. Patient also states he has generalized myalgias as well as headache. States he was seen similarly for this. Denies an onset thunderclap headache. No unilateral weakness, paresthesias, facial droop, difficulty with word finding, visual field cuts. Dates he has fibromyalgia and his abdominal pain has "set off" his fibromyalgia pain. He typically takes Advil for this. He did get [place] Covid vaccines. Denies fever, chills, chest pain, shortness of breath, hemoptysis, cough, congestion, rhinorrhea, dysuria, diarrhea, constipation, lateral leg swelling, redness or warmth. Denies additional aggravating or relieving factors.  History obtained from patient and past medical records. No interpreter used.  HPI     Past Medical History:  Diagnosis Date  . Arthritis    HANDS AND PT STATES HIS ARMS HURT  . 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 Colonic mass 2001  . Fibromyalgia 1990's   . Frequent PVCs   . H/O inguinal hernia    right side  . Headache(784.0)   . Hernia, umbilical   . History of kidney stones    HX OF MULTIPLE KIDNEY STONES AND  CURRENTLY HAS BILATERAL RENAL  STONES CAUSING ABDOMINAL AND BACK PAIN  . Hypertension    high blood pressure readings   . NICM (nonischemic cardiomyopathy) ([place])   . NSVT (nonsustained ventricular tachycardia) ([place])   . Premature atrial contractions   . Prolonged Q-T interval on ECG     Patient Active Problem List   Diagnosis Date Noted  . Frequent PVCs   . NSVT (nonsustained ventricular tachycardia) ([place])   . Premature atrial contractions   . Systolic dysfunction   . Essential hypertension 02/03/2015  . 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 BRAIN TUMOR EXCISION    . CARDIAC CATHETERIZATION N/A 04/09/2015   Procedure: Left Heart Cath and Coronary Angiography;  Surgeon: Burnell Blanks, MD;  Location: [facility];  Service: Cardiovascular;  Laterality: N/A;  . COLON SURGERY  2001   COLON RESECTION FOR GROWTH - NOT CANCER  . CYSTOSCOPY WITH RETROGRADE PYELOGRAM, URETEROSCOPY AND STENT PLACEMENT Left 10/05/2012   Procedure: CYSTOSCOPY WITH RETROGRADE PYELOGRAM, URETEROSCOPY AND STENT PLACEMENT;  Surgeon: Alexis Frock, MD;  Location: [facility];  Service: Urology;  Laterality: Left;  . CYSTOSCOPY/RETROGRADE/URETEROSCOPY/STONE EXTRACTION WITH BASKET Right 10/05/2012   Procedure: CYSTOSCOPY/RETROGRADE/URETEROSCOPY/STONE EXTRACTION WITH BASKET;  Surgeon: Alexis Frock, MD;  Location: [facility];  Service: Urology;  Laterality: Right;  . CYSTOSCOPY/RETROGRADE/URETEROSCOPY/STONE EXTRACTION WITH BASKET Bilateral 10/24/2012   Procedure: CYSTOSCOPY/RETROGRADE/URETEROSCOPY/STONE EXTRACTION WITH BASKET;  Surgeon: Alexis Frock, MD;  Location: [facility];  Service: Urology;  Laterality: Bilateral;  with bilateral stent placement  . HOLMIUM LASER APPLICATION Right 2/40/9735   Procedure: HOLMIUM LASER  APPLICATION;  Surgeon: Alexis Frock, MD;  Location: [facility];  Service: Urology;  Laterality: Right;  . HOLMIUM LASER APPLICATION Bilateral 03/05/5168   Procedure: HOLMIUM  LASER APPLICATION;  Surgeon: Alexis Frock, MD;  Location: [facility];  Service: Urology;  Laterality: Bilateral;  . LITHOTRIPSY    . TONSILLECTOMY         Family History  Problem Relation Age of Onset  . Colon cancer Father   . Heart disease Father        quad bypass    Social History   Tobacco Use  . Smoking status: Never Smoker  . Smokeless tobacco: Never Used  Vaping Use  . Vaping Use: Never used  Substance Use Topics  . Alcohol use: Yes    Alcohol/week: 7.0 standard drinks    Types: 7 Standard drinks or equivalent per week  . Drug use: Yes    Types: Amyl nitrate    Home Medications Prior to Admission medications   Medication Sig Start Date End Date Taking? Authorizing Provider  aspirin EC 81 MG tablet Take 1 tablet (81 mg total) by mouth daily. 04/02/15   Dorothy Spark, MD  Bioflavonoid Products (BIOFLEX PO) Take 2 tablets by mouth daily.     [provider]  carvedilol (COREG) 3.125 MG tablet Take 1 tablet (3.125 mg total) by mouth 2 (two) times daily. 09/10/19 12/09/19  Nafziger, Tommi Rumps, NP  citalopram (CELEXA) 40 MG tablet Take 1 tablet (40 mg total) by mouth daily. 09/10/19   Nafziger, Tommi Rumps, NP  Cranberry 1000 MG CAPS Take 1,000 mg by mouth daily.     [provider]  ibuprofen (ADVIL,MOTRIN) 200 MG tablet Take 600 mg by mouth 2 (two) times daily. Take 3 pills in am and 3 pills at bedtime    [provider]  lisinopril (ZESTRIL) 10 MG tablet TAKE 1 TABLET BY MOUTH EVERY DAY 09/10/19   Nafziger, Tommi Rumps, NP  meclizine (ANTIVERT) 25 MG tablet Take 1 tablet (25 mg total) by mouth 3 (three) times daily as needed for dizziness. 09/10/19   Nafziger, Tommi Rumps, NP  Multiple Vitamin (MULTIVITAMIN) tablet Take 1 tablet by mouth daily.    [provider]  ondansetron (ZOFRAN ODT) 4 MG disintegrating tablet Take 1 tablet (4 mg total) by mouth every 8 (eight) hours as needed for nausea or vomiting. 09/14/19   Ciro Tashiro A, PA-C    Allergies    Other,  Prozac [fluoxetine hcl], Sulfa antibiotics, Vicodin [hydrocodone-acetaminophen], and Erythromycin  Review of Systems   Review of Systems  Constitutional: Positive for activity change, appetite change and fatigue. Negative for chills and diaphoresis.  HENT: Negative.   Respiratory: Negative.   Cardiovascular: Negative.   Gastrointestinal: Positive for abdominal pain, nausea and vomiting. Negative for abdominal distention, anal bleeding, blood in stool, constipation, diarrhea and rectal pain.  Genitourinary: Negative.   Musculoskeletal: Positive for myalgias. Negative for arthralgias, back pain, gait problem, joint swelling, neck pain and neck stiffness.  Skin: Negative.   Neurological: Positive for headaches. Negative for dizziness, tremors, seizures, syncope, facial asymmetry, speech difficulty, weakness, light-headedness and numbness.  All other systems reviewed and are negative.   Physical Exam Updated Vital Signs BP (!) 133/97 (BP Location: Right Arm)   Pulse 61   Temp 98 F (36.7 C) (Oral)   Resp 18   Ht 6\' 1"  (1.854 m)   Wt 78.5 kg   SpO2 96%   BMI 22.82 kg/m   Physical Exam Vitals and nursing note reviewed.  Constitutional:      General: He is not in acute distress.    Appearance: He is not ill-appearing, toxic-appearing or diaphoretic.  HENT:     Head: Normocephalic and atraumatic.     Jaw: There is normal jaw occlusion.     Right Ear: Tympanic membrane, ear canal and external ear normal. There is no impacted cerumen. No hemotympanum. Tympanic membrane is not injected, scarred, perforated, erythematous, retracted or bulging.     Left Ear: Tympanic membrane, ear canal and external ear normal. There is no impacted cerumen. No hemotympanum. Tympanic membrane is not injected, scarred, perforated, erythematous, retracted or bulging.     Ears:     Comments: No Mastoid tenderness.    Nose:     Comments: Clear rhinorrhea and congestion to bilateral nares.  No sinus  tenderness.    Mouth/Throat:     Mouth: Mucous membranes are moist.     Comments: Posterior oropharynx clear.  Mucous membranes moist.  Tonsils without erythema or exudate.  Uvula midline without deviation.  No evidence of PTA or RPA.  No drooling, dysphasia or trismus.  Phonation normal. Eyes:     Extraocular Movements: Extraocular movements intact.     Conjunctiva/sclera: Conjunctivae normal.  Neck:     Trachea: Trachea and phonation normal.     Meningeal: Brudzinski's sign and Kernig's sign absent.     Comments: No Neck stiffness or neck rigidity.  No meningismus.  No cervical lymphadenopathy. Cardiovascular:     Rate and Rhythm: Normal rate.     Pulses: Normal pulses.          Radial pulses are 2+ on the right side and 2+ on the left side.       Dorsalis pedis pulses are 2+ on the right side and 2+ on the left side.       Posterior tibial pulses are 2+ on the right side and 2+ on the left side.     Heart sounds: Normal heart sounds.     Comments: No murmurs rubs or gallops. Pulmonary:     Effort: Pulmonary effort is normal.     Breath sounds: Normal breath sounds and air entry.     Comments: Clear to auscultation bilaterally without wheeze, rhonchi or rales.  No accessory muscle usage.  Able speak in full sentences. Abdominal:     General: Bowel sounds are normal.     Palpations: Abdomen is soft.     Tenderness: There is abdominal tenderness in the suprapubic area. There is no right CVA tenderness, left CVA tenderness, guarding or rebound. Negative signs include Murphy's sign.     Hernia: A hernia is present. Hernia is present in the umbilical area and ventral area.     Comments: Soft, nontender without rebound or guarding.  No CVA tenderness.  Ventral and umbilical hernia which is reducible.  There is inguinal hernia which is reducible as well  Musculoskeletal:     Cervical back: Full passive range of motion without pain and normal range of motion.     Comments: Moves all 4  extremities without difficulty.  Lower extremities without edema, erythema or warmth.  Lymphadenopathy:     Cervical: No cervical adenopathy.  Skin:    General: Skin is warm.     Capillary Refill: Capillary refill takes less than 2 seconds.     Comments: Brisk capillary refill.  No rashes or lesions.  Neurological:     General: No focal deficit present.     Mental  Status: He is alert.     Cranial Nerves: Cranial nerves are intact.     Sensory: Sensation is intact.     Motor: Motor function is intact.     Coordination: Coordination is intact.     Gait: Gait is intact.     Comments: Mental Status:  Alert, oriented, thought content appropriate. Speech fluent without evidence of aphasia. Able to follow 2 step commands without difficulty.  Cranial Nerves:  II:  Peripheral visual fields grossly normal, pupils equal, round, reactive to light III,IV, VI: ptosis not present, extra-ocular motions intact bilaterally  V,VII: smile symmetric, facial light touch sensation equal VIII: hearing grossly normal bilaterally  IX,X: midline uvula rise  XI: bilateral shoulder shrug equal and strong XII: midline tongue extension  Motor:  5/5 in upper and lower extremities bilaterally including strong and equal grip strength and dorsiflexion/plantar flexion Sensory: Pinprick and light touch normal in all extremities.  Deep Tendon Reflexes: 2+ and symmetric  Cerebellar: normal finger-to-nose with bilateral upper extremities Gait: normal gait and balance CV: distal pulses palpable throughout      ED Results / Procedures / Treatments   Labs (all labs ordered are listed, but only abnormal results are displayed) Labs Reviewed  COMPREHENSIVE METABOLIC PANEL - Abnormal; Notable for the following components:      Result Value   Glucose, Bld 115 (*)    All other components within normal limits  URINALYSIS, ROUTINE W REFLEX MICROSCOPIC - Abnormal; Notable for the following components:   Color, Urine STRAW  (*)    All other components within normal limits  SARS CORONAVIRUS 2 BY RT PCR (HOSPITAL ORDER, [facility])  URINE CULTURE  LIPASE, BLOOD  CBC    EKG None  Radiology CT ABDOMEN PELVIS W CONTRAST  Result Date: 09/14/2019 CLINICAL DATA:  Mid to lower abdominal pain. EXAM: CT ABDOMEN AND PELVIS WITH CONTRAST TECHNIQUE: Multidetector CT imaging of the abdomen and pelvis was performed using the standard protocol following bolus administration of intravenous contrast. CONTRAST:  111mL OMNIPAQUE IOHEXOL 300 MG/ML  SOLN COMPARISON:  CT abdomen 08/15/2017 FINDINGS: Lower chest: Lung bases are clear. Hepatobiliary: No focal hepatic lesion. No biliary duct dilatation. Common bile duct is normal. Pancreas: Pancreas is normal. No ductal dilatation. No pancreatic inflammation. Spleen: Normal spleen Adrenals/urinary tract: Adrenal glands normal. Bilateral renal cortical thinning. There multiple bilateral renal calculi. No obstruction. Ureters normal without calculi or obstruction. There is a tiny calculus in the dependent surface of the bladder measuring 1 mm image 69/2. Stomach/Bowel: Stomach, duodenum small-bowel normal. Appendix normal. The colon and rectosigmoid colon are normal. Vascular/Lymphatic: Abdominal aorta is normal caliber. No periportal or retroperitoneal adenopathy. No pelvic adenopathy. Reproductive: Prostate unremarkable. Other: Small RIGHT inguinal hernia contains a nonobstructed loop of small bowel. Approximately 4 cm of small bowel enter and exit the hernia (image 74/2). The inguinal hernia measures 2.9 cm in diameter. Midline ventral hernias which contain peritoneal fat. Musculoskeletal: No aggressive osseous lesion. IMPRESSION: 1. Small dependent calculus within the lumen the bladder may represent a recent passed ureteral stone. No hydroureter or ureteral inflammation present. 2. Bilateral nephrolithiasis. 3. Short segment small bowel enters a RIGHT inguinal hernia. No  evidence of bowel obstruction or inflammation; however, the loop of bowel may be at risk for future strangulation or incarceration. Electronically Signed   By: Suzy Bouchard M.D.   On: 09/14/2019 18:59    Procedures Procedures (including critical care time)  Medications Ordered in ED Medications  sodium  chloride (PF) 0.9 % injection (has no administration in time range)  metoCLOPramide (REGLAN) injection 10 mg (has no administration in time range)  sodium chloride flush (NS) 0.9 % injection 3 mL (3 mLs Intravenous Given 09/14/19 1655)  sodium chloride 0.9 % bolus 1,000 mL (1,000 mLs Intravenous New Bag/Given 09/14/19 1728)  ondansetron (ZOFRAN) injection 4 mg (4 mg Intravenous Given 09/14/19 1725)  morphine 4 MG/ML injection 4 mg (4 mg Intravenous Given 09/14/19 1727)  iohexol (OMNIPAQUE) 300 MG/ML solution 100 mL (100 mLs Intravenous Contrast Given 09/14/19 1832)   ED Course  I have reviewed the triage vital signs and the nursing notes.  Pertinent labs & imaging results that were available during my care of the patient were reviewed by me and considered in my medical decision making (see chart for details).  45 old presents for evaluation of myalgias and abdominal pain multiple episodes of NBNB emesis.  He is passing flatulence.  No diarrhea.  He has a nonfocal neuro exam without deficits.  Abdomen soft without tenderness.  He does have reducible hernias.  No neck stiffness neck rigidity.  No meningismus.  His heart and lungs are clear.  Plan on labs, imaging and reassess.  Plan on treating pain.  Labs and imaging personally reviewed and interpreted:   CBC without leukocytosis  Metabolic panel with mild hyperglycemia to 115, renal abnormalities Lipase 33 Covid negative Urine culture sent however.  UA negative for infection EKG No STEMI CT AP with likely recently passed ureteral stone, inguinal hernia which does not appear incarcerated.  Patient reassessed.  Pain controlled.  HA resolved.  Continues nonfocal neuro exam without deficits.  Plan to p.o. challenge and reassess  Patient able to tolerate PO without difficulty.  Abdomen soft, nontender.  He is ambulatory without difficulty.  Discussed labs and findings.  Will DC home with follow-up with general surgery for his hernia, does follow-up with [facility] for his kidney stone.  He will return for any worsening symptoms.   Patient does not meet the SIRS or Sepsis criteria.  On repeat exam patient does not have a surgical abdomin and there are no peritoneal signs.  No indication of appendicitis, bowel obstruction, bowel perforation, hernia incarceration/strangulation, cholecystitis, diverticulitis.    The patient has been appropriately medically screened and/or stabilized in the ED. I have low suspicion for any other emergent medical condition which would require further screening, evaluation or treatment in the ED or require inpatient management.  Patient is hemodynamically stable and in no acute distress.  Patient able to ambulate in department prior to ED.  Evaluation does not show acute pathology that would require ongoing or additional emergent interventions while in the emergency department or further inpatient treatment.  I have discussed the diagnosis with the patient and answered all questions.  Pain is been managed while in the emergency department and patient has no further complaints prior to discharge.  Patient is comfortable with plan discussed in room and is stable for discharge at this time.  I have discussed strict return precautions for returning to the emergency department.  Patient was encouraged to follow-up with PCP/specialist refer to at discharge.  Patient seen and evaluated by attending, Dr. Roslynn Amble who agrees with the above treatment, plan and disposition.     MDM Rules/Calculators/A&P                           Final Clinical Impression(s) / ED Diagnoses Final diagnoses:  Lower  abdominal pain    Unilateral recurrent inguinal hernia without obstruction or gangrene  Myalgia  Bladder stone    Rx / DC Orders ED Discharge Orders         Ordered    ondansetron (ZOFRAN ODT) 4 MG disintegrating tablet  Every 8 hours PRN     Discontinue  Reprint     09/14/19 2106           Ronal Maybury A, PA-C 09/14/19 2108    Lucrezia Starch, MD 09/16/19 1625

## 2019-09-14 NOTE — ED Triage Notes (Signed)
Patient here from home reporting abd pain, n/v. Reports that he was seen for same last month.

## 2019-09-14 NOTE — Discharge Instructions (Addendum)
Follow-up with general surgery for your hernias  Follow-up with [facility] for your recurrent kidney stones  Take the medications as prescribed for your nausea and vomiting.  Take Tylenol and ibuprofen as well at home for your pain  Return for any new or worsening symptoms.

## 2019-09-16 LAB — URINE CULTURE: Culture: NO GROWTH

## 2019-09-30 ENCOUNTER — Other Ambulatory Visit: Payer: Self-pay

## 2019-09-30 ENCOUNTER — Emergency Department (HOSPITAL_COMMUNITY): Payer: Medicare Other

## 2019-09-30 ENCOUNTER — Emergency Department (HOSPITAL_COMMUNITY)
Admission: EM | Admit: 2019-09-30 | Discharge: 2019-09-30 | Disposition: A | Discharge status: Home / Self Care | Payer: Medicare Other | Attending: Emergency Medicine | Admitting: Emergency Medicine

## 2019-09-30 ENCOUNTER — Encounter (HOSPITAL_COMMUNITY): Payer: Self-pay

## 2019-09-30 DIAGNOSIS — R079 Chest pain, unspecified: Secondary | ICD-10-CM | POA: Insufficient documentation

## 2019-09-30 DIAGNOSIS — R0789 Other chest pain: Secondary | ICD-10-CM | POA: Diagnosis not present

## 2019-09-30 DIAGNOSIS — Z7982 Long term (current) use of aspirin: Secondary | ICD-10-CM | POA: Insufficient documentation

## 2019-09-30 DIAGNOSIS — K219 Gastro-esophageal reflux disease without esophagitis: Secondary | ICD-10-CM | POA: Diagnosis not present

## 2019-09-30 LAB — CBC
HCT: 45.9 % (ref 39.0–52.0)
Hemoglobin: 15.3 g/dL (ref 13.0–17.0)
MCH: 33.6 pg (ref 26.0–34.0)
MCHC: 33.3 g/dL (ref 30.0–36.0)
MCV: 100.7 fL — ABNORMAL HIGH (ref 80.0–100.0)
Platelets: 291 10*3/uL (ref 150–400)
RBC: 4.56 MIL/uL (ref 4.22–5.81)
RDW: 12.4 % (ref 11.5–15.5)
WBC: 7.9 10*3/uL (ref 4.0–10.5)
nRBC: 0 % (ref 0.0–0.2)

## 2019-09-30 LAB — BASIC METABOLIC PANEL
Anion gap: 11 (ref 5–15)
BUN: 20 mg/dL (ref 8–23)
CO2: 24 mmol/L (ref 22–32)
Calcium: 9.5 mg/dL (ref 8.9–10.3)
Chloride: 101 mmol/L (ref 98–111)
Creatinine, Ser: 1.09 mg/dL (ref 0.61–1.24)
GFR calc Af Amer: >60 mL/min (ref >60)
GFR calc non Af Amer: >60 mL/min (ref >60)
Glucose, Bld: 111 mg/dL — ABNORMAL HIGH (ref 70–99)
Potassium: 4.4 mmol/L (ref 3.5–5.1)
Sodium: 136 mmol/L (ref 135–145)

## 2019-09-30 LAB — TROPONIN I (HIGH SENSITIVITY): Troponin I (High Sensitivity): 4 ng/L (ref <18)

## 2019-09-30 MED ORDER — ONDANSETRON 4 MG PO TBDP
4.0000 mg | ORAL_TABLET | Freq: Once | ORAL | Status: AC | PRN
  Administered 2019-09-30: 4 mg via ORAL
  Filled 2019-09-30: qty 1

## 2019-09-30 MED ORDER — ALUM & MAG HYDROXIDE-SIMETH 200-200-20 MG/5ML PO SUSP
30.0000 mL | Freq: Once | ORAL | Status: AC
  Administered 2019-09-30: 30 mL via ORAL
  Filled 2019-09-30: qty 30

## 2019-09-30 NOTE — ED Notes (Signed)
Patient complains of nausea, given ODT zofran. States he takes that at home as needed.

## 2019-09-30 NOTE — ED Provider Notes (Signed)
[place] DEPT Provider Note   CSN: [id number] Arrival date & time: 09/30/19  2956     History Chief Complaint  Patient presents with  . Chest Pain    [name] is a 79 y.o. male with a past medical history of hypertension, nonischemic cardiomyopathy, SVT, kidney stones presenting to the ED with a chief complaint of chest pain.  States that for the past few weeks he has been dealing with generalized fatigue, nausea and decreased appetite.  He was diagnosed with a kidney stone about 2 weeks ago.  He has had intermittent dizziness for the past few weeks as well.  He was diagnosed with vertigo and prescribed meclizine.  However he woke up this morning around 5:30 AM with continued dizziness and central and left-sided chest pain.  Describes it as a "tickle" intermittently throughout his chest which last several seconds and alleviates without intervention.  No history of similar symptoms in the past.  Denies any shortness of breath, cough, fever, abdominal pain, numbness in arms or legs.  No history of MI in the past although does report a family history of CAD. Believes he underwent a cardiac catheterization in 2017 which she was told was unremarkable. Denies any leg swelling, hemoptysis, recent immobilization, recent surgeries, history of DVT or PE.  HPI     Past Medical History:  Diagnosis Date  . Arthritis    HANDS AND PT STATES HIS ARMS HURT  . Brain tumor [facility])    age 54 - brain tumor removed left- benign - occas slight headaches since the surgery  . Colonic mass 2001  . Fibromyalgia 1990's   . Frequent PVCs   . H/O inguinal hernia    right side  . Headache(784.0)   . Hernia, umbilical   . History of kidney stones    HX OF MULTIPLE KIDNEY STONES AND  CURRENTLY HAS BILATERAL RENAL STONES CAUSING ABDOMINAL AND BACK PAIN  . Hypertension    high blood pressure readings   . NICM (nonischemic cardiomyopathy) ([place])   . NSVT (nonsustained  ventricular tachycardia) ([place])   . Premature atrial contractions   . Prolonged Q-T interval on ECG     Patient Active Problem List   Diagnosis Date Noted  . Frequent PVCs   . NSVT (nonsustained ventricular tachycardia) ([place])   . Premature atrial contractions   . Systolic dysfunction   . Essential hypertension 02/03/2015  . 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 BRAIN TUMOR EXCISION    . CARDIAC CATHETERIZATION N/A 04/09/2015   Procedure: Left Heart Cath and Coronary Angiography;  Surgeon: Burnell Blanks, MD;  Location: [facility];  Service: Cardiovascular;  Laterality: N/A;  . COLON SURGERY  2001   COLON RESECTION FOR GROWTH - NOT CANCER  . CYSTOSCOPY WITH RETROGRADE PYELOGRAM, URETEROSCOPY AND STENT PLACEMENT Left 10/05/2012   Procedure: CYSTOSCOPY WITH RETROGRADE PYELOGRAM, URETEROSCOPY AND STENT PLACEMENT;  Surgeon: Alexis Frock, MD;  [facility];  Service: Urology;  Laterality: Left;  . CYSTOSCOPY/RETROGRADE/URETEROSCOPY/STONE EXTRACTION WITH BASKET Right 10/05/2012   Procedure: CYSTOSCOPY/RETROGRADE/URETEROSCOPY/STONE EXTRACTION WITH BASKET;  Surgeon: Alexis Frock, MD;  Location[facility];  Service: Urology;  Laterality: Right;  . CYSTOSCOPY/RETROGRADE/URETEROSCOPY/STONE EXTRACTION WITH BASKET Bilateral 10/24/2012   Procedure: CYSTOSCOPY/RETROGRADE/URETEROSCOPY/STONE EXTRACTION WITH BASKET;  Surgeon: Alexis Frock, MD;  [facility];  Service: Urology;  Laterality: Bilateral;  with bilateral stent placement  . HOLMIUM LASER APPLICATION Right 03/23/863   Procedure: HOLMIUM  LASER APPLICATION;  Surgeon: Alexis Frock, MD;  [facility];  Service: Urology;  Laterality: Right;  . HOLMIUM LASER APPLICATION Bilateral 2/58/5277   Procedure: HOLMIUM LASER APPLICATION;  Surgeon: Alexis Frock, MD;  [facility];  Service: Urology;  Laterality: Bilateral;  . LITHOTRIPSY    . TONSILLECTOMY          Family History  Problem Relation Age of Onset  . Colon cancer Father   . Heart disease Father        quad bypass    Social History   Tobacco Use  . Smoking status: Never Smoker  . Smokeless tobacco: Never Used  Vaping Use  . Vaping Use: Never used  Substance Use Topics  . Alcohol use: Yes    Alcohol/week: 7.0 standard drinks    Types: 7 Standard drinks or equivalent per week  . Drug use: Yes    Types: Amyl nitrate    Home Medications Prior to Admission medications   Medication Sig Start Date End Date Taking? Authorizing Provider  aspirin EC 81 MG tablet Take 1 tablet (81 mg total) by mouth daily. 04/02/15  Yes Dorothy Spark, MD  carvedilol (COREG) 3.125 MG tablet Take 1 tablet (3.125 mg total) by mouth 2 (two) times daily. 09/10/19 12/09/19 Yes Nafziger, Tommi Rumps, NP  Cranberry 1000 MG CAPS Take 1,000 mg by mouth daily.    Yes [provider]  ibuprofen (ADVIL,MOTRIN) 200 MG tablet Take 600 mg by mouth 2 (two) times daily.    Yes [provider]  lisinopril (ZESTRIL) 10 MG tablet TAKE 1 TABLET BY MOUTH EVERY DAY Patient taking differently: Take 10 mg by mouth daily.  09/10/19  Yes Nafziger, Tommi Rumps, NP  meclizine (ANTIVERT) 25 MG tablet Take 1 tablet (25 mg total) by mouth 3 (three) times daily as needed for dizziness. 09/10/19  Yes Nafziger, Tommi Rumps, NP  Misc Natural Products (OSTEO BI-FLEX ADV JOINT SHIELD PO) Take 2 capsules by mouth daily.   Yes [provider]  Multiple Vitamin (MULTIVITAMIN) tablet Take 1 tablet by mouth daily.   Yes [provider]  ondansetron (ZOFRAN ODT) 4 MG disintegrating tablet Take 1 tablet (4 mg total) by mouth every 8 (eight) hours as needed for nausea or vomiting. 09/14/19  Yes Henderly, Britni A, PA-C  citalopram (CELEXA) 40 MG tablet Take 1 tablet (40 mg total) by mouth daily. Patient not taking: Reported on 09/30/2019 09/10/19   Dorothyann Peng, NP    Allergies    Other, Prozac [fluoxetine hcl], Sulfa  antibiotics, Vicodin [hydrocodone-acetaminophen], and Erythromycin  Review of Systems   Review of Systems  Constitutional: Negative for appetite change, chills and fever.  HENT: Negative for ear pain, rhinorrhea, sneezing and sore throat.   Eyes: Negative for photophobia and visual disturbance.  Respiratory: Negative for cough, chest tightness, shortness of breath and wheezing.   Cardiovascular: Positive for chest pain. Negative for palpitations.  Gastrointestinal: Negative for abdominal pain, blood in stool, constipation, diarrhea, nausea and vomiting.  Genitourinary: Negative for dysuria, hematuria and urgency.  Musculoskeletal: Negative for myalgias.  Skin: Negative for rash.  Neurological: Positive for dizziness. Negative for weakness and light-headedness.    Physical Exam Updated Vital Signs BP 128/70 (BP Location: Right Arm)   Pulse 93   Temp 98.2 F (36.8 C) (Oral)   Resp 11   SpO2 100%   Physical Exam Vitals and nursing note reviewed.  Constitutional:      General: He is not in acute distress.  Appearance: He is well-developed.  HENT:     Head: Normocephalic and atraumatic.     Nose: Nose normal.  Eyes:     General: No scleral icterus.       Left eye: No discharge.     Conjunctiva/sclera: Conjunctivae normal.  Cardiovascular:     Rate and Rhythm: Normal rate and regular rhythm.     Heart sounds: Normal heart sounds. No murmur heard.  No friction rub. No gallop.   Pulmonary:     Effort: Pulmonary effort is normal. No respiratory distress.     Breath sounds: Normal breath sounds.  Abdominal:     General: Bowel sounds are normal. There is no distension.     Palpations: Abdomen is soft.     Tenderness: There is no abdominal tenderness. There is no guarding.  Musculoskeletal:        General: Normal range of motion.     Cervical back: Normal range of motion and neck supple.     Right lower leg: No tenderness.     Left lower leg: No tenderness.  Skin:     General: Skin is warm and dry.     Findings: No rash.  Neurological:     General: No focal deficit present.     Mental Status: He is alert and oriented to person, place, and time.     Cranial Nerves: No cranial nerve deficit.     Motor: No weakness or abnormal muscle tone.     Coordination: Coordination normal.     ED Results / Procedures / Treatments   Labs (all labs ordered are listed, but only abnormal results are displayed) Labs Reviewed  BASIC METABOLIC PANEL - Abnormal; Notable for the following components:      Result Value   Glucose, Bld 111 (*)    All other components within normal limits  CBC - Abnormal; Notable for the following components:   MCV 100.7 (*)    All other components within normal limits  TROPONIN I (HIGH SENSITIVITY)    EKG EKG Interpretation  Date/Time:  Monday September 30 2019 06:29:58 EDT Ventricular Rate:  55 PR Interval:    QRS Duration: 103 QT Interval:  467 QTC Calculation: 447 R Axis:   -12 Text Interpretation: Sinus rhythm Atrial premature complex Artifact otherwise unremarkable ECG Confirmed by Carmin Muskrat [phone]) on 09/30/2019 12:30:55 PM   Radiology DG Chest 2 View  Result Date: 09/30/2019 CLINICAL DATA:  Chest pain EXAM: CHEST - 2 VIEW COMPARISON:  7/9/9 FINDINGS: The heart size and mediastinal contours are within normal limits. Both lungs are clear. Degenerative changes noted within the thoracic spine. IMPRESSION: No active cardiopulmonary disease. Electronically Signed   By: Kerby Moors M.D.   On: 09/30/2019 07:24    Procedures Procedures (including critical care time)  Medications Ordered in ED Medications  ondansetron (ZOFRAN-ODT) disintegrating tablet 4 mg (4 mg Oral Given 09/30/19 0857)  alum & mag hydroxide-simeth (MAALOX/MYLANTA) 200-200-20 MG/5ML suspension 30 mL (30 mLs Oral Given 09/30/19 1234)    ED Course  I have reviewed the triage vital signs and the nursing notes.  Pertinent labs & imaging results that  were available during my care of the patient were reviewed by me and considered in my medical decision making (see chart for details).    MDM Rules/Calculators/A&P                          79 year old male with a past medical  history of hypertension, SVT, nonischemic cardiomyopathy presenting to the ED with a chief complaint of chest pain.  For the past few weeks has been having generalized fatigue, nausea and diagnosed with a kidney stone about 2 weeks ago.  He has had intermittent dizziness for the past few weeks as well.  However this morning woke up with central and left-sided chest pain that has been intermittent without specific aggravating or alleviating factor.  No respiratory symptoms, fever, no history of DVT, PE or MI in the past. On exam patient is overall well appearing. No signs of respiratory distress or airway compromise.  No lower extremity edema, erythema or calf tenderness That would concern me for DVT.  His vital signs are within normal limits, he is not tachycardic, tachypneic or hypoxic.  EKG shows sinus rhythm, PACs which patient has a history of.  Chest x-ray is unremarkable.  BMP, CBC and troponin unremarkable.  Patient given GI cocktail here but appears overall well.  He remains hemodynamically stable here.  I doubt ACS as a cause of his symptoms with his reassuring work-up here.  Doubt PE, pneumonia or other emergent cause.  He is comfortable with following up with PCP and will give recommendations to follow-up with GI as well. Return precautions given. Patient discussed with and seen by the attending, Dr. Vanita Panda.  Patient is hemodynamically stable, in NAD, and able to ambulate in the ED. Evaluation does not show pathology that would require ongoing emergent intervention or inpatient treatment. I explained the diagnosis to the patient. Pain has been managed and has no complaints prior to discharge. Patient is comfortable with above plan and is stable for discharge at this time.  All questions were answered prior to disposition. Strict return precautions for returning to the ED were discussed. Encouraged follow up with PCP.   An After Visit Summary was printed and given to the patient.   Portions of this note were generated with Lobbyist. Dictation errors may occur despite best attempts at proofreading.  Final Clinical Impression(s) / ED Diagnoses Final diagnoses:  Chest wall pain  Gastroesophageal reflux disease, unspecified whether esophagitis present    Rx / DC Orders ED Discharge Orders    None       Delia Heady, PA-C 09/30/19 1345    Carmin Muskrat, MD 09/30/19 1712

## 2019-09-30 NOTE — ED Triage Notes (Signed)
Patient arrived with complaints of intermittent central chest pain and dizziness that started this morning. Reports some nausea and generalized body aches.

## 2019-09-30 NOTE — Discharge Instructions (Addendum)
Continue your home medications as previously prescribed. Follow up with the GI doctor and your primary care provider. Return to the ED if you start to experience worsening chest pain, shortness of breath, vomiting, bloody stools.

## 2019-10-01 ENCOUNTER — Encounter: Payer: Self-pay | Admitting: Adult Health

## 2019-10-01 ENCOUNTER — Telehealth: Payer: Self-pay | Admitting: Adult Health

## 2019-10-01 ENCOUNTER — Ambulatory Visit (INDEPENDENT_AMBULATORY_CARE_PROVIDER_SITE_OTHER): Payer: Medicare Other | Admitting: Adult Health

## 2019-10-01 ENCOUNTER — Other Ambulatory Visit: Payer: Self-pay

## 2019-10-01 VITALS — BP 110/80 | HR 68 | Temp 98.7°F | Wt 170.0 lb

## 2019-10-01 DIAGNOSIS — R300 Dysuria: Secondary | ICD-10-CM | POA: Diagnosis not present

## 2019-10-01 DIAGNOSIS — K458 Other specified abdominal hernia without obstruction or gangrene: Secondary | ICD-10-CM | POA: Diagnosis not present

## 2019-10-01 DIAGNOSIS — R1030 Lower abdominal pain, unspecified: Secondary | ICD-10-CM

## 2019-10-01 LAB — POCT URINALYSIS DIPSTICK
Bilirubin, UA: NEGATIVE
Blood, UA: NEGATIVE
Glucose, UA: NEGATIVE
Ketones, UA: NEGATIVE
Leukocytes, UA: NEGATIVE
Nitrite, UA: NEGATIVE
Protein, UA: NEGATIVE
Spec Grav, UA: >=1.03 — AB (ref 1.010–1.025)
Urobilinogen, UA: 0.2 E.U./dL
pH, UA: 5.5 (ref 5.0–8.0)

## 2019-10-01 MED ORDER — TRAMADOL HCL 50 MG PO TABS: 50.0000 mg | ORAL_TABLET | Freq: Three times a day (TID) | ORAL | 0 refills | Status: AC | PRN

## 2019-10-01 MED ORDER — TAMSULOSIN HCL 0.4 MG PO CAPS: 0.4000 mg | ORAL_CAPSULE | Freq: Every day | ORAL | 3 refills | Status: DC

## 2019-10-01 NOTE — Telephone Encounter (Signed)
No longer needed

## 2019-10-02 LAB — URINALYSIS, ROUTINE W REFLEX MICROSCOPIC
Bilirubin Urine: NEGATIVE
Glucose, UA: NEGATIVE
Hgb urine dipstick: NEGATIVE
Ketones, ur: NEGATIVE
Leukocytes,Ua: NEGATIVE
Nitrite: NEGATIVE
Protein, ur: NEGATIVE
Specific Gravity, Urine: 1.024 (ref 1.001–1.03)
pH: 5.5 (ref 5.0–8.0)

## 2019-10-02 NOTE — Progress Notes (Signed)
Subjective:    Patient ID: [name], male    DOB: [birth year], 79 y.o.   MRN: [id number]  HPI 79 year old male who  has a past medical history of Arthritis, Brain tumor ([place]), Colonic mass (2001), Fibromyalgia (1990's ), Frequent PVCs, H/O inguinal hernia, JASNKNLZ(767.3), Hernia, umbilical, History of kidney stones, Hypertension, NICM (nonischemic cardiomyopathy) ([place]), NSVT (nonsustained ventricular tachycardia) ([place]), Premature atrial contractions, and Prolonged Q-T interval on ECG.  Resents to the office today for abdominal pain.  Is originally seen in the emergency room on September 14, 2019 with a complaint of generalized abdominal pain however it was worse in his lower quadrant over the previous week.  He reported multiple episodes of nonbloody emesis as well as persistent nausea.  His CT showed   IMPRESSION: 1. Small dependent calculus within the lumen the bladder may represent a recent passed ureteral stone. No hydroureter or ureteral inflammation present. 2. Bilateral nephrolithiasis. 3. Short segment small bowel enters a RIGHT inguinal hernia. No evidence of bowel obstruction or inflammation; however, the loop of bowel may be at risk for future strangulation or incarceration.  He was discharged home with referral to general surgery for his hernias.   He was then seen in the emergency room again 1 day ago chief complaint of chest pain.  Per patient "I thought I was having a heart attack".  His EKG showed sinus rhythm with atrial premature complexes.  Lab work including CBC, CMP, and troponin were negative  Today he reports that his chest pain has resolved but he continues to have lower abdominal pain, decreased urine stream, painful urination, and generalized fatigue.  He has not noticed passing any further kidney stones   Review of Systems See HPI   Past Medical History:  Diagnosis Date  . Arthritis    HANDS AND PT STATES HIS ARMS HURT  . Brain tumor [facility])    age  84 - brain tumor removed left- benign - occas slight headaches since the surgery  . Colonic mass 2001  . Fibromyalgia 1990's   . Frequent PVCs   . H/O inguinal hernia    right side  . Headache(784.0)   . Hernia, umbilical   . History of kidney stones    HX OF MULTIPLE KIDNEY STONES AND  CURRENTLY HAS BILATERAL RENAL STONES CAUSING ABDOMINAL AND BACK PAIN  . Hypertension    high blood pressure readings   . NICM (nonischemic cardiomyopathy) ([place])   . NSVT (nonsustained ventricular tachycardia) ([place])   . Premature atrial contractions   . Prolonged Q-T interval on ECG     Social History   Socioeconomic History  . Marital status: Single    Spouse name: Not on file  . Number of children: Not on file  . Years of education: Not on file  . Highest education level: Not on file  Occupational History  . Not on file  Tobacco Use  . Smoking status: Never Smoker  . Smokeless tobacco: Never Used  Vaping Use  . Vaping Use: Never used  Substance and Sexual Activity  . Alcohol use: Yes    Alcohol/week: 7.0 standard drinks    Types: 7 Standard drinks or equivalent per week  . Drug use: Yes    Types: Amyl nitrate  . Sexual activity: Not on file  Other Topics Concern  . Not on file  Social History Narrative   Retired from Museum/gallery curator    Has one son - lives local    [place] to  play Tennis   Social Determinants of Health   Financial Resource Strain:   . Difficulty of Paying Living Expenses: Not on file  Food Insecurity:   . Worried About Charity fundraiser in the Last Year: Not on file  . Ran Out of Food in the Last Year: Not on file  Transportation Needs:   . Lack of Transportation (Medical): Not on file  . Lack of Transportation (Non-Medical): Not on file  Physical Activity:   . Days of Exercise per Week: Not on file  . Minutes of Exercise per Session: Not on file  Stress:   . Feeling of Stress : Not on file  Social Connections:   . Frequency of Communication with Friends and  Family: Not on file  . Frequency of Social Gatherings with Friends and Family: Not on file  . Attends Religious Services: Not on file  . Active Member of Clubs or Organizations: Not on file  . Attends Archivist Meetings: Not on file  . Marital Status: Not on file  Intimate Partner Violence:   . Fear of Current or Ex-Partner: Not on file  . Emotionally Abused: Not on file  . Physically Abused: Not on file  . Sexually Abused: Not on file    Past Surgical History:  Procedure Laterality Date  . BRAIN TUMOR EXCISION    . CARDIAC CATHETERIZATION N/A 04/09/2015   Procedure: Left Heart Cath and Coronary Angiography;  Surgeon: Burnell Blanks, MD;  Location: [place] [facility];  Service: Cardiovascular;  Laterality: N/A;  . COLON SURGERY  2001   COLON RESECTION FOR GROWTH - NOT CANCER  . CYSTOSCOPY WITH RETROGRADE PYELOGRAM, URETEROSCOPY AND STENT PLACEMENT Left 10/05/2012   Procedure: CYSTOSCOPY WITH RETROGRADE PYELOGRAM, URETEROSCOPY AND STENT PLACEMENT;  Surgeon: Alexis Frock, MD;  [facility];  Service: Urology;  Laterality: Left;  . CYSTOSCOPY/RETROGRADE/URETEROSCOPY/STONE EXTRACTION WITH BASKET Right 10/05/2012   Procedure: CYSTOSCOPY/RETROGRADE/URETEROSCOPY/STONE EXTRACTION WITH BASKET;  Surgeon: Alexis Frock, MD;  [facility];  Service: Urology;  Laterality: Right;  . CYSTOSCOPY/RETROGRADE/URETEROSCOPY/STONE EXTRACTION WITH BASKET Bilateral 10/24/2012   Procedure: CYSTOSCOPY/RETROGRADE/URETEROSCOPY/STONE EXTRACTION WITH BASKET;  Surgeon: Alexis Frock, MD;  [facility];  Service: Urology;  Laterality: Bilateral;  with bilateral stent placement  . HOLMIUM LASER APPLICATION Right 2/68/3419   Procedure: HOLMIUM LASER APPLICATION;  Surgeon: Alexis Frock, MD;  [facility];  Service: Urology;  Laterality: Right;  . HOLMIUM LASER APPLICATION Bilateral 07/29/2977   Procedure: HOLMIUM LASER APPLICATION;  Surgeon: Alexis Frock, MD;  [facility];   Service: Urology;  Laterality: Bilateral;  . LITHOTRIPSY    . TONSILLECTOMY      Family History  Problem Relation Age of Onset  . Colon cancer Father   . Heart disease Father        quad bypass    Allergies  Allergen Reactions  . Other Other (See Comments)    [place] - caused a severe rash  . Prozac [Fluoxetine Hcl]     Made him feel "loopy"  . Sulfa Antibiotics Hives  . Vicodin [Hydrocodone-Acetaminophen] Itching    Tolerates oxycodone  . Erythromycin Rash    Many years ago an oral antibiotic caused a rash(wife and patient think it was erythromycin but they are not positive).    Current Outpatient Medications on File Prior to Visit  Medication Sig Dispense Refill  . aspirin EC 81 MG tablet Take 1 tablet (81 mg total) by mouth daily. 90 tablet 3  . carvedilol (COREG) 3.125 MG  tablet Take 1 tablet (3.125 mg total) by mouth 2 (two) times daily. 180 tablet 1  . citalopram (CELEXA) 40 MG tablet Take 1 tablet (40 mg total) by mouth daily. 90 tablet 1  . Cranberry 1000 MG CAPS Take 1,000 mg by mouth daily.     Marland Kitchen ibuprofen (ADVIL,MOTRIN) 200 MG tablet Take 600 mg by mouth 2 (two) times daily.     Marland Kitchen lisinopril (ZESTRIL) 10 MG tablet TAKE 1 TABLET BY MOUTH EVERY DAY (Patient taking differently: Take 10 mg by mouth daily. ) 90 tablet 1  . meclizine (ANTIVERT) 25 MG tablet Take 1 tablet (25 mg total) by mouth 3 (three) times daily as needed for dizziness. 30 tablet 1  . Misc Natural Products (OSTEO BI-FLEX ADV JOINT SHIELD PO) Take 2 capsules by mouth daily.    . Multiple Vitamin (MULTIVITAMIN) tablet Take 1 tablet by mouth daily.    . ondansetron (ZOFRAN ODT) 4 MG disintegrating tablet Take 1 tablet (4 mg total) by mouth every 8 (eight) hours as needed for nausea or vomiting. 20 tablet 0   No current facility-administered medications on file prior to visit.    BP 110/80 (BP Location: Right Arm, Patient Position: Sitting, Cuff Size: Normal)   Pulse 68   Temp 98.7 F (37.1 C) (Oral)    Wt 170 lb (77.1 kg)   SpO2 95%   BMI 22.43 kg/m       Objective:   Physical Exam Vitals and nursing note reviewed.  Constitutional:      Appearance: He is well-developed.  Cardiovascular:     Rate and Rhythm: Normal rate and regular rhythm.     Pulses: Normal pulses.     Heart sounds: Normal heart sounds.  Pulmonary:     Effort: Pulmonary effort is normal.     Breath sounds: Normal breath sounds.  Abdominal:     General: Abdomen is flat. Bowel sounds are normal.     Palpations: Abdomen is soft.     Tenderness: There is abdominal tenderness in the periumbilical area and suprapubic area. There is no right CVA tenderness, left CVA tenderness, guarding or rebound.     Hernia: A hernia is present. Hernia is present in the umbilical area, ventral area and right inguinal area.     Comments: Hernias are easily reducible but he has some tenderness with reduction     Musculoskeletal:        General: Normal range of motion.  Skin:    General: Skin is warm and dry.     Capillary Refill: Capillary refill takes less than 2 seconds.  Neurological:     General: No focal deficit present.     Mental Status: He is alert and oriented to person, place, and time.  Psychiatric:        Mood and Affect: Mood normal.        Behavior: Behavior normal.        Thought Content: Thought content normal.        Judgment: Judgment normal.       Assessment & Plan:  1. Lower abdominal pain -Likely another kidney stone that is causing his discomfort.  Will give short course of tramadol for pain relief as well as start him on Flomax to take until his kidney stones have resolved.  Advise follow-up if no improvement over the next week or sooner if needed - POCT urinalysis dipstick- Negative  - tamsulosin (FLOMAX) 0.4 MG CAPS capsule; Take 1 capsule (0.4 mg total)  by mouth daily.  Dispense: 30 capsule; Refill: 3 - Urinalysis with Reflex Microscopic; Future - traMADol (ULTRAM) 50 MG tablet; Take 1 tablet (50  mg total) by mouth every 8 (eight) hours as needed for up to 5 days.  Dispense: 15 tablet; Refill: 0 - Urinalysis with Reflex Microscopic  2. Other specified abdominal hernia without obstruction or gangrene  - Ambulatory referral to [place], NP

## 2019-10-17 ENCOUNTER — Other Ambulatory Visit: Payer: Self-pay | Admitting: Adult Health

## 2019-10-17 ENCOUNTER — Telehealth: Payer: Self-pay | Admitting: Adult Health

## 2019-10-17 DIAGNOSIS — R1030 Lower abdominal pain, unspecified: Secondary | ICD-10-CM

## 2019-10-17 NOTE — Telephone Encounter (Signed)
Please advise. Rx is not on the med list? °

## 2019-10-17 NOTE — Telephone Encounter (Signed)
Pt called in to see if he could get a appointment with Tommi Rumps so that he can get some additional pain medication for the kidney stones.  Pt is aware that we do not have any open appointment today and he was given the number to [place] office that he had requested to go to.

## 2019-12-02 ENCOUNTER — Ambulatory Visit: Payer: Self-pay | Admitting: Surgery

## 2019-12-02 DIAGNOSIS — I493 Ventricular premature depolarization: Secondary | ICD-10-CM | POA: Diagnosis not present

## 2019-12-02 DIAGNOSIS — K4090 Unilateral inguinal hernia, without obstruction or gangrene, not specified as recurrent: Secondary | ICD-10-CM | POA: Diagnosis not present

## 2019-12-02 DIAGNOSIS — K432 Incisional hernia without obstruction or gangrene: Secondary | ICD-10-CM | POA: Diagnosis not present

## 2019-12-02 NOTE — H&P (Signed)
Ilsa Iha Appointment: 12/02/2019 3:15 PM Location: [facility] Patient #: [id number] DOB: 09/30/1940 Single / Language: [name] / Race: White Male  History of Present Illness Adin Hector MD; 12/02/2019 5:08 PM) The patient is a 79 year old male who presents with an incisional hernia. Note for "Incisional hernia": ` ` ` Patient sent for surgical consultation at the request of Beaulah Dinning, NP  Chief Complaint: Worsening incisional and right inguinal hernias. ` ` The patient is a thin gentleman with prior abdominal surgeries. Was seen in 2017 by Dr. Redmond Pulling with our group concerning hernias. Patient sent back to me. Patient findings seeing me instead of Dr. Redmond Pulling at this time. She was diagnosed with periumbilical ventral hernias and right inguinal hernia. Recommendation made to consider hernia repair. Patient had some intermittent chest pains and cardiac clearance was recommended. Also overdue for colonoscopy and this was recommended. Patient never had a colonoscopy. However he did see cardiology. Had some abnormal testing the cardiac catheterization looked rather normal. Hold her monitoring noting some PVCs but no major cardiac dysrhythmias. Reassuring. Last seen by them in 2018. He plays tennis regularly. 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 Moderately active. As his bowels every day. He is not on blood thinners. Does not smoke. No diabetes. Can walk several miles without difficulty. No history of skin infections or fall. No hospitalizations or any major medical events since last seen 4 years ago.  (Review of systems as stated in this history (HPI) or in the review of systems. Otherwise all other 12 point ROS are negative) ` ` ###########################################`  This patient encounter took 35 minutes today to perform the following: obtain history, perform exam, review outside  records, interpret tests & imaging, counsel the patient on their diagnosis; and, document this encounter, including findings & plan in the electronic health record (EHR).   Allergies (Chanel Teressa Senter, CMA; 12/02/2019 3:14 PM) Sulfabenzamide *CHEMICALS* Vicodin ES *ANALGESICS - OPIOID* Erythromycin *DERMATOLOGICALS* Allergies Reconciled  Medication History (Chanel Teressa Senter, CMA; 12/02/2019 3:15 PM) Tamsulosin HCl (0.4MG  Capsule, Oral) Active. Coreg (3.125MG  Tablet, Oral) Active. CeleXA (40MG  Tablet, Oral) Active. Lisinopril (5MG  Tablet, Oral) Active. Medications Reconciled    Vitals (Chanel Nolan CMA; 12/02/2019 3:16 PM) 12/02/2019 3:15 PM Weight: 171.25 lb Height: 72in Body Surface Area: 1.99 m Body Mass Index: 23.23 kg/m  Temp.: 97.88F  Pulse: 87 (Regular)  BP: 136/84(Sitting, Left Arm, Standard)        Physical Exam Adin Hector MD; 12/02/2019 3:34 PM)  General Mental Status-Alert. General Appearance-Not in acute distress, Not Sickly. Orientation-Oriented X3. Hydration-Well hydrated. Voice-Normal.  Integumentary Global Assessment Upon inspection and palpation of skin surfaces of the - Axillae: non-tender, no inflammation or ulceration, no drainage. and Distribution of scalp and body hair is normal. General Characteristics Temperature - normal warmth is noted.  Head and Neck Head-normocephalic, atraumatic with no lesions or palpable masses. Face Global Assessment - atraumatic, no absence of expression. Neck Global Assessment - no abnormal movements, no bruit auscultated on the right, no bruit auscultated on the left, no decreased range of motion, non-tender. Trachea-midline. Thyroid Gland Characteristics - non-tender.  Eye Eyeball - Left-Extraocular movements intact, No Nystagmus - Left. Eyeball - Right-Extraocular movements intact, No Nystagmus - Right. Cornea - Left-No Hazy - Left. Cornea - Right-No Hazy -  Right. Sclera/Conjunctiva - Left-No scleral icterus, No Discharge - Left. Sclera/Conjunctiva - Right-No scleral icterus, No Discharge - Right. Pupil - Left-Direct reaction to light  normal. Pupil - Right-Direct reaction to light normal.  ENMT Ears Pinna - Left - no drainage observed, no generalized tenderness observed. Pinna - Right - no drainage observed, no generalized tenderness observed. Nose and Sinuses External Inspection of the Nose - no destructive lesion observed. Inspection of the nares - Left - quiet respiration. Inspection of the nares - Right - quiet respiration. Mouth and Throat Lips - Upper Lip - no fissures observed, no pallor noted. Lower Lip - no fissures observed, no pallor noted. Nasopharynx - no discharge present. Oral Cavity/Oropharynx - Tongue - no dryness observed. Oral Mucosa - no cyanosis observed. Hypopharynx - no evidence of airway distress observed.  Chest and Lung Exam Inspection Movements - Normal and Symmetrical. Accessory muscles - No use of accessory muscles in breathing. Palpation Palpation of the chest reveals - Non-tender. Auscultation Breath sounds - Normal and Clear.  Cardiovascular Auscultation Rhythm - Regular. Murmurs & Other Heart Sounds - Auscultation of the heart reveals - No Murmurs and No Systolic Clicks.  Abdomen Inspection Inspection of the abdomen reveals - No Visible peristalsis and No Abnormal pulsations. Umbilicus - No Bleeding, No Urine drainage. Palpation/Percussion Palpation and Percussion of the abdomen reveal - Soft, Non Tender, No Rebound tenderness, No Rigidity (guarding) and No Cutaneous hyperesthesia. Note: Abdomen soft. Thin wall. Moderate diastases. Supraumbilical swelling reduces down to supraumbilical hernia. Not fully reducible but most likely 3 cm defect. Obvious periumbilical hernia with very thinned out skin reduces down to 3 cm defect. Sensitive. Low midline incision.  Male Genitourinary Sexual  Maturity Tanner 5 - Adult hair pattern and Adult penile size and shape. Note: Obvious right groin bulging consistent with reducible inguinal hernia. Sensitivity and mild impulse on left side. No major hernia there. No inguinal lymphadenopathy. Otherwise normal external male genitalia.  Peripheral Vascular Upper Extremity Inspection - Left - No Cyanotic nailbeds - Left, Not Ischemic. Inspection - Right - No Cyanotic nailbeds - Right, Not Ischemic.  Neurologic Neurologic evaluation reveals -normal attention span and ability to concentrate, able to name objects and repeat phrases. Appropriate fund of knowledge , normal sensation and normal coordination. Mental Status Affect - not angry, not paranoid. Cranial Nerves-Normal Bilaterally. Gait-Normal.  Neuropsychiatric Mental status exam performed with findings of-able to articulate well with normal speech/language, rate, volume and coherence, thought content normal with ability to perform basic computations and apply abstract reasoning and no evidence of hallucinations, delusions, obsessions or homicidal/suicidal ideation.  Musculoskeletal Global Assessment Spine, Ribs and Pelvis - no instability, subluxation or laxity. Right Upper Extremity - no instability, subluxation or laxity.  Lymphatic Head & Neck  General Head & Neck Lymphatics: Bilateral - Description - No Localized lymphadenopathy. Axillary  General Axillary Region: Bilateral - Description - No Localized lymphadenopathy. Femoral & Inguinal  Generalized Femoral & Inguinal Lymphatics: Left - Description - No Localized lymphadenopathy. Right - Description - No Localized lymphadenopathy.    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 I think he would benefit from surgical repair. He is interested in proceeding. He did have underwhelming cardiac  workup in 2017-18 with good exercise tolerance. He is followed closely by his primary care office with no new issues. I think is reasonable proceed. Given the fact he's had incisional & inguinal hernias, maybe overnight stay.  He is hoping to do around the winter holidays when he is not working as much. We will try and coordinate it around that  time but I cautioned that I cannot guarantee that. He understands.  Current Plans [facility] Consent - Hernia Repair - Ventral/Incisional/Umbilical (Tangala Wiegert): discussed with patient and provided information.  RIGHT INGUINAL HERNIA (K40.90) Impression: Obvious right inguinal hernia sensitive but reducible. Some pain to his inner thighs and groins. Probable left inguinal hernia on CT scan. Subtle at the most on exam. I would recommend repair of all hernias in the abdominal pelvic cavity at the same time. Perhaps TAPP Approach versus starting TEP for the inguinal hernias & then converting to intra-abdominal IPUM approach  Current Plans The anatomy & physiology of the abdominal wall and pelvic floor was discussed. The pathophysiology of hernias in the inguinal and pelvic region was discussed. Natural history risks such as progressive enlargement, pain, incarceration, and strangulation was discussed. Contributors to complications such as smoking, obesity, diabetes, prior surgery, etc were discussed.  I feel the risks of no intervention will lead to serious problems that outweigh the operative risks; therefore, I recommended surgery to reduce and repair the hernia. I explained laparoscopic techniques with possible need for an open approach. I noted usual use of mesh to patch and/or buttress hernia repair  Risks such as bleeding, infection, abscess, need for further treatment, heart attack, death, and other risks were discussed. I noted a good likelihood this will help address the problem. Goals of post-operative recovery were discussed as well. Possibility that  this will not correct all symptoms was explained. I stressed the importance of low-impact activity, aggressive pain control, avoiding constipation, & not pushing through pain to minimize risk of post-operative chronic pain or injury. Possibility of reherniation was discussed. We will work to minimize complications.  An educational handout further explaining the pathology & treatment options was given as well. Questions were answered. The patient expresses understanding & wishes to proceed with surgery.   PREOP - [place] - ENCOUNTER FOR PREOPERATIVE EXAMINATION FOR GENERAL SURGICAL PROCEDURE (Z01.818)  Current Plans You are being scheduled for surgery- Our schedulers will call you.  You should hear from our office's scheduling department within 5 working days about the location, date, and time of surgery. We try to make accommodations for patient's preferences in scheduling surgery, but sometimes the OR schedule or the surgeon's schedule prevents [place] from making those accommodations.  If you have not heard from our office [phone]) in 5 working days, call the office and ask for your surgeon's nurse.  If you have other questions about your diagnosis, plan, or surgery, call the office and ask for your surgeon's nurse.  Written instructions provided Pt Education - [facility] Hernia Post-Op HCI (Breion Novacek): discussed with patient and provided information. Pt Education - Pamphlet Given - Laparoscopic Hernia Repair: discussed with patient and provided information. Pt Education - [facility] Mesh education: discussed with patient and provided information.  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 Had extensive cardiac workup in 2017-2018 with normal cath angiography and underwhelming Holter montitor with pVCs. Excellent exercise tolerance. Skeptical he needs further cardiac clearance as long as primary care physician agrees.  Adin Hector, MD, FACS, MASCRS Gastrointestinal and Minimally  Invasive Surgery  [facility] [address], [place], [place] [address] [phone] Fax [phone] Main/Paging  CONTACT INFORMATION: Weekday (9AM-5PM) concerns: Call [facility] main office at [phone] Weeknight (5PM-9AM) or Weekend/Holiday concerns: Check [email or web address] for General Surgery [facility] coverage (Please, do not use SecureChat as it is not reliable communication to operating surgeons for immediate patient care)

## 2019-12-02 NOTE — H&P (Signed)
[name] Appointment: 12/02/2019 3:15 PM Location: [facility] Patient #: [id number] DOB: May 26, 1940 Single / Language: [name] / Race: White Male  History of Present Illness Alex Hector MD; 12/02/2019 5:08 PM) The patient is a 79 year old male who presents with an incisional hernia. Note for "Incisional hernia": ` ` ` Patient sent for surgical consultation at the request of Beaulah Dinning, NP  Chief Complaint: Worsening incisional and right inguinal hernias. ` ` The patient is a thin gentleman with prior abdominal surgeries. Was seen in 2017 by Dr. Redmond Pulling with our group concerning hernias. Patient sent back to me. Patient findings seeing me instead of Dr. Redmond Pulling at this time. She was diagnosed with periumbilical ventral hernias and right inguinal hernia. Recommendation made to consider hernia repair. Patient had some intermittent chest pains and cardiac clearance was recommended. Also overdue for colonoscopy and this was recommended. Patient never had a colonoscopy. However he did see cardiology. Had some abnormal testing the cardiac catheterization looked rather normal. Hold her monitoring noting some PVCs but no major cardiac dysrhythmias. Reassuring. Last seen by them in 2018. He plays tennis regularly. 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 Moderately active. As his bowels every day. He is not on blood thinners. Does not smoke. No diabetes. Can walk several miles without difficulty. No history of skin infections or fall. No hospitalizations or any major medical events since last seen 4 years ago.  (Review of systems as stated in this history (HPI) or in the review of systems. Otherwise all other 12 point ROS are negative) ` ` ###########################################`  This patient encounter took 35 minutes today to perform the following: obtain history, perform exam, review outside  records, interpret tests & imaging, counsel the patient on their diagnosis; and, document this encounter, including findings & plan in the electronic health record (EHR).   Allergies (Chanel Teressa Senter, CMA; 12/02/2019 3:14 PM) Sulfabenzamide *CHEMICALS* Vicodin ES *ANALGESICS - OPIOID* Erythromycin *DERMATOLOGICALS* Allergies Reconciled  Medication History (Chanel Teressa Senter, CMA; 12/02/2019 3:15 PM) Tamsulosin HCl (0.4MG  Capsule, Oral) Active. Coreg (3.125MG  Tablet, Oral) Active. CeleXA (40MG  Tablet, Oral) Active. Lisinopril (5MG  Tablet, Oral) Active. Medications Reconciled    Vitals (Chanel Nolan CMA; 12/02/2019 3:16 PM) 12/02/2019 3:15 PM Weight: 171.25 lb Height: 72in Body Surface Area: 1.99 m Body Mass Index: 23.23 kg/m  Temp.: 97.57F  Pulse: 87 (Regular)  BP: 136/84(Sitting, Left Arm, Standard)        Physical Exam Alex Hector MD; 12/02/2019 3:34 PM)  General Mental Status-Alert. General Appearance-Not in acute distress, Not Sickly. Orientation-Oriented X3. Hydration-Well hydrated. Voice-Normal.  Integumentary Global Assessment Upon inspection and palpation of skin surfaces of the - Axillae: non-tender, no inflammation or ulceration, no drainage. and Distribution of scalp and body hair is normal. General Characteristics Temperature - normal warmth is noted.  Head and Neck Head-normocephalic, atraumatic with no lesions or palpable masses. Face Global Assessment - atraumatic, no absence of expression. Neck Global Assessment - no abnormal movements, no bruit auscultated on the right, no bruit auscultated on the left, no decreased range of motion, non-tender. Trachea-midline. Thyroid Gland Characteristics - non-tender.  Eye Eyeball - Left-Extraocular movements intact, No Nystagmus - Left. Eyeball - Right-Extraocular movements intact, No Nystagmus - Right. Cornea - Left-No Hazy - Left. Cornea - Right-No Hazy -  Right. Sclera/Conjunctiva - Left-No scleral icterus, No Discharge - Left. Sclera/Conjunctiva - Right-No scleral icterus, No Discharge - Right. Pupil - Left-Direct reaction to light  normal. Pupil - Right-Direct reaction to light normal.  ENMT Ears Pinna - Left - no drainage observed, no generalized tenderness observed. Pinna - Right - no drainage observed, no generalized tenderness observed. Nose and Sinuses External Inspection of the Nose - no destructive lesion observed. Inspection of the nares - Left - quiet respiration. Inspection of the nares - Right - quiet respiration. Mouth and Throat Lips - Upper Lip - no fissures observed, no pallor noted. Lower Lip - no fissures observed, no pallor noted. Nasopharynx - no discharge present. Oral Cavity/Oropharynx - Tongue - no dryness observed. Oral Mucosa - no cyanosis observed. Hypopharynx - no evidence of airway distress observed.  Chest and Lung Exam Inspection Movements - Normal and Symmetrical. Accessory muscles - No use of accessory muscles in breathing. Palpation Palpation of the chest reveals - Non-tender. Auscultation Breath sounds - Normal and Clear.  Cardiovascular Auscultation Rhythm - Regular. Murmurs & Other Heart Sounds - Auscultation of the heart reveals - No Murmurs and No Systolic Clicks.  Abdomen Inspection Inspection of the abdomen reveals - No Visible peristalsis and No Abnormal pulsations. Umbilicus - No Bleeding, No Urine drainage. Palpation/Percussion Palpation and Percussion of the abdomen reveal - Soft, Non Tender, No Rebound tenderness, No Rigidity (guarding) and No Cutaneous hyperesthesia. Note: Abdomen soft. Thin wall. Moderate diastases. Supraumbilical swelling reduces down to supraumbilical hernia. Not fully reducible but most likely 3 cm defect. Obvious periumbilical hernia with very thinned out skin reduces down to 3 cm defect. Sensitive. Low midline incision.  Male Genitourinary Sexual  Maturity Tanner 5 - Adult hair pattern and Adult penile size and shape. Note: Obvious right groin bulging consistent with reducible inguinal hernia. Sensitivity and mild impulse on left side. No major hernia there. No inguinal lymphadenopathy. Otherwise normal external male genitalia.  Peripheral Vascular Upper Extremity Inspection - Left - No Cyanotic nailbeds - Left, Not Ischemic. Inspection - Right - No Cyanotic nailbeds - Right, Not Ischemic.  Neurologic Neurologic evaluation reveals -normal attention span and ability to concentrate, able to name objects and repeat phrases. Appropriate fund of knowledge , normal sensation and normal coordination. Mental Status Affect - not angry, not paranoid. Cranial Nerves-Normal Bilaterally. Gait-Normal.  Neuropsychiatric Mental status exam performed with findings of-able to articulate well with normal speech/language, rate, volume and coherence, thought content normal with ability to perform basic computations and apply abstract reasoning and no evidence of hallucinations, delusions, obsessions or homicidal/suicidal ideation.  Musculoskeletal Global Assessment Spine, Ribs and Pelvis - no instability, subluxation or laxity. Right Upper Extremity - no instability, subluxation or laxity.  Lymphatic Head & Neck  General Head & Neck Lymphatics: Bilateral - Description - No Localized lymphadenopathy. Axillary  General Axillary Region: Bilateral - Description - No Localized lymphadenopathy. Femoral & Inguinal  Generalized Femoral & Inguinal Lymphatics: Left - Description - No Localized lymphadenopathy. Right - Description - No Localized lymphadenopathy.    Assessment & Plan Alex Hector MD; 12/02/2019 3:52 PM)  INCISIONAL HERNIA, WITHOUT OBSTRUCTION OR GANGRENE (K43.2) Impression: Periumbilical Swiss cheeselike hernias. I think he would benefit from surgical repair. He is interested in proceeding. He did have underwhelming cardiac  workup in 2017-18 with good exercise tolerance. He is followed closely by his primary care office with no new issues. I think is reasonable proceed. Given the fact he's had incisional & inguinal hernias, maybe overnight stay.  He is hoping to do around the winter holidays when he is not working as much. We will try and coordinate it around that  time but I cautioned that I cannot guarantee that. He understands.  Current Plans [facility] Consent - Hernia Repair - Ventral/Incisional/Umbilical (Kaiah Hosea): discussed with patient and provided information.  RIGHT INGUINAL HERNIA (K40.90) Impression: Obvious right inguinal hernia sensitive but reducible. Some pain to his inner thighs and groins. Probable left inguinal hernia on CT scan. Subtle at the most on exam. I would recommend repair of all hernias in the abdominal pelvic cavity at the same time. Perhaps TAPP Approach versus starting TEP for the inguinal hernias & then converting to intra-abdominal IPUM approach  Current Plans The anatomy & physiology of the abdominal wall and pelvic floor was discussed. The pathophysiology of hernias in the inguinal and pelvic region was discussed. Natural history risks such as progressive enlargement, pain, incarceration, and strangulation was discussed. Contributors to complications such as smoking, obesity, diabetes, prior surgery, etc were discussed.  I feel the risks of no intervention will lead to serious problems that outweigh the operative risks; therefore, I recommended surgery to reduce and repair the hernia. I explained laparoscopic techniques with possible need for an open approach. I noted usual use of mesh to patch and/or buttress hernia repair  Risks such as bleeding, infection, abscess, need for further treatment, heart attack, death, and other risks were discussed. I noted a good likelihood this will help address the problem. Goals of post-operative recovery were discussed as well. Possibility that  this will not correct all symptoms was explained. I stressed the importance of low-impact activity, aggressive pain control, avoiding constipation, & not pushing through pain to minimize risk of post-operative chronic pain or injury. Possibility of reherniation was discussed. We will work to minimize complications.  An educational handout further explaining the pathology & treatment options was given as well. Questions were answered. The patient expresses understanding & wishes to proceed with surgery.   PREOP - [place] - ENCOUNTER FOR PREOPERATIVE EXAMINATION FOR GENERAL SURGICAL PROCEDURE (Z01.818)  Current Plans You are being scheduled for surgery- Our schedulers will call you.  You should hear from our office's scheduling department within 5 working days about the location, date, and time of surgery. We try to make accommodations for patient's preferences in scheduling surgery, but sometimes the OR schedule or the surgeon's schedule prevents [place] from making those accommodations.  If you have not heard from our office [phone]) in 5 working days, call the office and ask for your surgeon's nurse.  If you have other questions about your diagnosis, plan, or surgery, call the office and ask for your surgeon's nurse.  Written instructions provided Pt Education - [facility] Hernia Post-Op HCI (Tobenna Needs): discussed with patient and provided information. Pt Education - Pamphlet Given - Laparoscopic Hernia Repair: discussed with patient and provided information. Pt Education - [facility] Mesh education: discussed with patient and provided information.  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 Had extensive cardiac workup in 2017-2018 with normal cath angiography and underwhelming Holter montitor with pVCs. Excellent exercise tolerance. Skeptical he needs further cardiac clearance as long as primary care physician agrees.  Alex Hector, MD, FACS, MASCRS Gastrointestinal and Minimally  Invasive Surgery  [facility] [address] [place], [place] [address] [phone] Fax [phone] Main/Paging  CONTACT INFORMATION: Weekday (9AM-5PM) concerns: Call [facility] main office at [phone] Weeknight (5PM-9AM) or Weekend/Holiday concerns: Check [email or web address] for General Surgery [facility] coverage (Please, do not use SecureChat as it is not reliable communication to operating surgeons for immediate patient care)

## 2019-12-11 ENCOUNTER — Encounter: Payer: Medicare Other | Admitting: Adult Health

## 2019-12-24 ENCOUNTER — Other Ambulatory Visit: Payer: Self-pay | Admitting: Adult Health

## 2019-12-24 DIAGNOSIS — R1030 Lower abdominal pain, unspecified: Secondary | ICD-10-CM

## 2020-01-17 ENCOUNTER — Other Ambulatory Visit: Payer: Self-pay

## 2020-01-17 ENCOUNTER — Ambulatory Visit (INDEPENDENT_AMBULATORY_CARE_PROVIDER_SITE_OTHER): Payer: Medicare Other | Admitting: Adult Health

## 2020-01-17 ENCOUNTER — Encounter: Payer: Self-pay | Admitting: Adult Health

## 2020-01-17 VITALS — BP 128/78 | HR 54 | Temp 98.0°F | Ht 70.0 in | Wt 173.2 lb

## 2020-01-17 DIAGNOSIS — M159 Polyosteoarthritis, unspecified: Secondary | ICD-10-CM

## 2020-01-17 DIAGNOSIS — F419 Anxiety disorder, unspecified: Secondary | ICD-10-CM

## 2020-01-17 DIAGNOSIS — I10 Essential (primary) hypertension: Secondary | ICD-10-CM | POA: Diagnosis not present

## 2020-01-17 DIAGNOSIS — M8949 Other hypertrophic osteoarthropathy, multiple sites: Secondary | ICD-10-CM

## 2020-01-17 DIAGNOSIS — N400 Enlarged prostate without lower urinary tract symptoms: Secondary | ICD-10-CM | POA: Diagnosis not present

## 2020-01-17 LAB — POCT URINALYSIS DIPSTICK
Bilirubin, UA: NEGATIVE
Blood, UA: NEGATIVE
Glucose, UA: NEGATIVE
Ketones, UA: NEGATIVE
Leukocytes, UA: NEGATIVE
Nitrite, UA: NEGATIVE
Protein, UA: NEGATIVE
Spec Grav, UA: >=1.03 — AB (ref 1.010–1.025)
Urobilinogen, UA: 0.2 E.U./dL
pH, UA: 6 (ref 5.0–8.0)

## 2020-01-17 MED ORDER — MELOXICAM 15 MG PO TABS: 15.0000 mg | ORAL_TABLET | Freq: Every day | ORAL | 0 refills | Status: DC

## 2020-01-17 NOTE — Progress Notes (Signed)
Subjective:    Patient ID: ELVIS LAUFER, male    DOB: May 03, 1940, 79 y.o.   MRN: [id number]  HPI Patient presents for yearly preventative medicine examination. He is a pleasant 79 year old male who  has a past medical history of Arthritis, Brain tumor ([place]), Colonic mass (2001), Fibromyalgia (1990's ), Frequent PVCs, H/O inguinal hernia, NWGNFAOZ(308.6), Hernia, umbilical, History of kidney stones, Hypertension, NICM (nonischemic cardiomyopathy) ([place]), NSVT (nonsustained ventricular tachycardia) ([place]), Premature atrial contractions, and Prolonged Q-T interval on ECG.  Essential Hypertension - Currently prescribed lisinopril 10 mg and Coreg 3.125 mg BID.  Denies dizziness, lightheadedness, chest pain, or shortness of breath BP Readings from Last 3 Encounters:  01/17/20 128/78  10/01/19 110/80  09/30/19 128/70   Anxiety - well controlled with Celexa 40 mg   BPH - takes Flomax 0.4 mg daily.   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 Has not been playing tennis on a regular basis due to pain from hernias. Uses OTC motrin and tylenol without relief. He is wondering if he can get anything stronger. Pain is worse in the morning.   All immunizations and health maintenance protocols were reviewed with the patient and needed orders were placed.  Appropriate screening laboratory values were ordered for the patient including screening of hyperlipidemia, renal function and hepatic function. If indicated by BPH, a PSA was ordered.  Medication reconciliation,  past medical history, social history, problem list and allergies were reviewed in detail with the patient  Goals were established with regard to weight loss, exercise, and  diet in compliance with medications  Wt Readings from Last 3 Encounters:  01/17/20 173 lb 3.2 oz (78.6 kg)  10/01/19 170 lb (77.1 kg)  09/14/19 173 lb (78.5 kg)    Review of Systems   Constitutional: Negative.   HENT: Negative.   Eyes: Negative.   Respiratory: Negative.   Cardiovascular: Negative.   Gastrointestinal: Negative.   Endocrine: Negative.   Genitourinary: Negative.   Musculoskeletal: Positive for arthralgias.  Skin: Negative.   Allergic/Immunologic: Negative.   Neurological: Negative.   Hematological: Negative.   Psychiatric/Behavioral: Negative.   All other systems reviewed and are negative.  Past Medical History:  Diagnosis Date  . Arthritis    HANDS AND PT STATES HIS ARMS HURT  . Brain tumor [facility])    age 59 - brain tumor removed left- benign - occas slight headaches since the surgery  . Colonic mass 2001  . Fibromyalgia 1990's   . Frequent PVCs   . H/O inguinal hernia    right side  . Headache(784.0)   . Hernia, umbilical   . History of kidney stones    HX OF MULTIPLE KIDNEY STONES AND  CURRENTLY HAS BILATERAL RENAL STONES CAUSING ABDOMINAL AND BACK PAIN  . Hypertension    high blood pressure readings   . NICM (nonischemic cardiomyopathy) ([place])   . NSVT (nonsustained ventricular tachycardia) ([place])   . Premature atrial contractions   . Prolonged Q-T interval on ECG     Social History   Socioeconomic History  . Marital status: Single    Spouse name: Not on file  . Number of children: Not on file  . Years of education: Not on file  . Highest education level: Not on file  Occupational History  . Not on file  Tobacco Use  . Smoking status: Never Smoker  . Smokeless tobacco: Never Used  Vaping Use  .  Vaping Use: Never used  Substance and Sexual Activity  . Alcohol use: Yes    Alcohol/week: 7.0 standard drinks    Types: 7 Standard drinks or equivalent per week  . Drug use: Yes    Types: Amyl nitrate  . Sexual activity: Not on file  Other Topics Concern  . Not on file  Social History Narrative   Retired from Museum/gallery curator    Has one son - lives local    Likes to play Tennis   Social Determinants of Health   Financial  Resource Strain: Not on file  Food Insecurity: Not on file  Transportation Needs: Not on file  Physical Activity: Not on file  Stress: Not on file  Social Connections: Not on file  Intimate Partner Violence: Not on file    Past Surgical History:  Procedure Laterality Date  . BRAIN TUMOR EXCISION    . CARDIAC CATHETERIZATION N/A 04/09/2015   Procedure: Left Heart Cath and Coronary Angiography;  Surgeon: Burnell Blanks, MD;  Location: [facility];  Service: Cardiovascular;  Laterality: N/A;  . COLON SURGERY  2001   COLON RESECTION FOR GROWTH - NOT CANCER  . CYSTOSCOPY WITH RETROGRADE PYELOGRAM, URETEROSCOPY AND STENT PLACEMENT Left 10/05/2012   Procedure: CYSTOSCOPY WITH RETROGRADE PYELOGRAM, URETEROSCOPY AND STENT PLACEMENT;  Surgeon: Alexis Frock, MD;  Location: [facility];  Service: Urology;  Laterality: Left;  . CYSTOSCOPY/RETROGRADE/URETEROSCOPY/STONE EXTRACTION WITH BASKET Right 10/05/2012   Procedure: CYSTOSCOPY/RETROGRADE/URETEROSCOPY/STONE EXTRACTION WITH BASKET;  Surgeon: Alexis Frock, MD;  Location: [facility];  Service: Urology;  Laterality: Right;  . CYSTOSCOPY/RETROGRADE/URETEROSCOPY/STONE EXTRACTION WITH BASKET Bilateral 10/24/2012   Procedure: CYSTOSCOPY/RETROGRADE/URETEROSCOPY/STONE EXTRACTION WITH BASKET;  Surgeon: Alexis Frock, MD;  Location: [facility];  Service: Urology;  Laterality: Bilateral;  with bilateral stent placement  . HOLMIUM LASER APPLICATION Right 9/60/4540   Procedure: HOLMIUM LASER APPLICATION;  Surgeon: Alexis Frock, MD;  Location: [facility];  Service: Urology;  Laterality: Right;  . HOLMIUM LASER APPLICATION Bilateral 9/81/1914   Procedure: HOLMIUM LASER APPLICATION;  Surgeon: Alexis Frock, MD;  Location: [facility];  Service: Urology;  Laterality: Bilateral;  . LITHOTRIPSY    . TONSILLECTOMY      Family History  Problem Relation Age of Onset  . Colon cancer Father   . Heart disease Father        quad bypass    Allergies  Allergen Reactions   . Other Other (See Comments)    [place]ang[place] - caused a severe rash  . Prozac [Fluoxetine Hcl]     Made him feel "loopy"  . Sulfa Antibiotics Hives  . Vicodin [Hydrocodone-Acetaminophen] Itching    Tolerates oxycodone  . Erythromycin Rash    Many years ago an oral antibiotic caused a rash(wife and patient think it was erythromycin but they are not positive).    Current Outpatient Medications on File Prior to Visit  Medication Sig Dispense Refill  . aspirin EC 81 MG tablet Take 1 tablet (81 mg total) by mouth daily. 90 tablet 3  . citalopram (CELEXA) 40 MG tablet Take 1 tablet (40 mg total) by mouth daily. 90 tablet 1  . Cranberry 1000 MG CAPS Take 1,000 mg by mouth daily.     Marland Kitchen ibuprofen (ADVIL,MOTRIN) 200 MG tablet Take 600 mg by mouth 2 (two) times daily.     Marland Kitchen lisinopril (ZESTRIL) 10 MG tablet TAKE 1 TABLET BY MOUTH EVERY DAY (Patient taking differently: Take 10 mg by mouth daily.) 90 tablet 1  . Misc Natural Products (OSTEO BI-FLEX ADV  JOINT SHIELD PO) Take 2 capsules by mouth daily.    . Multiple Vitamin (MULTIVITAMIN) tablet Take 1 tablet by mouth daily.    . ondansetron (ZOFRAN ODT) 4 MG disintegrating tablet Take 1 tablet (4 mg total) by mouth every 8 (eight) hours as needed for nausea or vomiting. 20 tablet 0  . tamsulosin (FLOMAX) 0.4 MG CAPS capsule TAKE 1 CAPSULE BY MOUTH EVERY DAY 90 capsule 1  . carvedilol (COREG) 3.125 MG tablet Take 1 tablet (3.125 mg total) by mouth 2 (two) times daily. 180 tablet 1  . meclizine (ANTIVERT) 25 MG tablet Take 1 tablet (25 mg total) by mouth 3 (three) times daily as needed for dizziness. (Patient not taking: Reported on 01/17/2020) 30 tablet 1   No current facility-administered medications on file prior to visit.    BP 128/78   Pulse (!) 54   Temp 98 F (36.7 C) (Oral)   Ht _0  (1.778 m)   Wt 173 lb 3.2 oz (78.6 kg)   SpO2 97%   BMI 24.85 kg/m       Objective:   Physical Exam Vitals and nursing note reviewed.  Constitutional:       General: He is not in acute distress.    Appearance: Normal appearance. He is well-developed and normal weight.  HENT:     Head: Normocephalic and atraumatic.     Right Ear: Tympanic membrane, ear canal and external ear normal. There is no impacted cerumen.     Left Ear: Tympanic membrane, ear canal and external ear normal. There is no impacted cerumen.     Nose: Nose normal. No congestion or rhinorrhea.     Mouth/Throat:     Mouth: Mucous membranes are moist.     Pharynx: Oropharynx is clear. No oropharyngeal exudate or posterior oropharyngeal erythema.  Eyes:     General:        Right eye: No discharge.        Left eye: No discharge.     Extraocular Movements: Extraocular movements intact.     Conjunctiva/sclera: Conjunctivae normal.     Pupils: Pupils are equal, round, and reactive to light.  Neck:     Vascular: No carotid bruit.     Trachea: No tracheal deviation.  Cardiovascular:     Rate and Rhythm: Normal rate and regular rhythm.     Pulses: Normal pulses.     Heart sounds: Normal heart sounds. No murmur heard. No friction rub. No gallop.   Pulmonary:     Effort: Pulmonary effort is normal. No respiratory distress.     Breath sounds: Normal breath sounds. No stridor. No wheezing, rhonchi or rales.  Chest:     Chest wall: No tenderness.  Abdominal:     General: Bowel sounds are normal. There is no distension.     Palpations: Abdomen is soft. There is no mass.     Tenderness: There is no abdominal tenderness. There is no right CVA tenderness, left CVA tenderness, guarding or rebound.     Hernia: No hernia is present.  Musculoskeletal:        General: No swelling, tenderness, deformity or signs of injury. Normal range of motion.     Right lower leg: No edema.     Left lower leg: No edema.  Lymphadenopathy:     Cervical: No cervical adenopathy.  Skin:    General: Skin is warm and dry.     Capillary Refill: Capillary refill takes less than 2 seconds.  Coloration:  Skin is not jaundiced or pale.     Findings: No bruising, erythema, lesion or rash.  Neurological:     General: No focal deficit present.     Mental Status: He is alert and oriented to person, place, and time.     Cranial Nerves: No cranial nerve deficit.     Sensory: No sensory deficit.     Motor: No weakness.     Coordination: Coordination normal.     Gait: Gait normal.     Deep Tendon Reflexes: Reflexes normal.  Psychiatric:        Mood and Affect: Mood normal.        Behavior: Behavior normal.        Thought Content: Thought content normal.        Judgment: Judgment normal.       Assessment & Plan:  1. Essential hypertension - Well controlled.  - No change in medications  - POC Urinalysis Dipstick; Future - CBC with Differential/Platelet; Future - Lipid panel; Future - TSH; Future - CMP with eGFR([facility]); Future  2. Anxiety - Continue with Celexa   3. Primary osteoarthritis involving multiple joints  - meloxicam (MOBIC) 15 MG tablet; Take 1 tablet (15 mg total) by mouth daily.  Dispense: 30 tablet; Refill: 0  4. Benign prostatic hyperplasia without lower urinary tract symptoms  - PSA; Future  Dorothyann Peng, NP

## 2020-01-18 LAB — PSA: PSA: 1.55 ng/mL (ref <=4.0)

## 2020-01-18 LAB — COMPLETE METABOLIC PANEL WITH GFR
AG Ratio: 1.7 (calc) (ref 1.0–2.5)
ALT: 18 U/L (ref 9–46)
AST: 28 U/L (ref 10–35)
Albumin: 4.1 g/dL (ref 3.6–5.1)
Alkaline phosphatase (APISO): 89 U/L (ref 35–144)
BUN: 17 mg/dL (ref 7–25)
CO2: 28 mmol/L (ref 20–32)
Calcium: 9.3 mg/dL (ref 8.6–10.3)
Chloride: 103 mmol/L (ref 98–110)
Creat: 1.01 mg/dL (ref 0.70–1.18)
GFR, Est African American: 82 mL/min/{1.73_m2} (ref >=60)
GFR, Est Non African American: 71 mL/min/{1.73_m2} (ref >=60)
Globulin: 2.4 g/dL (calc) (ref 1.9–3.7)
Glucose, Bld: 81 mg/dL (ref 65–99)
Potassium: 4.4 mmol/L (ref 3.5–5.3)
Sodium: 139 mmol/L (ref 135–146)
Total Bilirubin: 0.7 mg/dL (ref 0.2–1.2)
Total Protein: 6.5 g/dL (ref 6.1–8.1)

## 2020-01-18 LAB — LIPID PANEL
Cholesterol: 211 mg/dL — ABNORMAL HIGH (ref <200)
HDL: 48 mg/dL (ref >=40)
LDL Cholesterol (Calc): 145 mg/dL (calc) — ABNORMAL HIGH
Non-HDL Cholesterol (Calc): 163 mg/dL (calc) — ABNORMAL HIGH (ref <130)
Total CHOL/HDL Ratio: 4.4 (calc) (ref <5.0)
Triglycerides: 80 mg/dL (ref <150)

## 2020-01-18 LAB — CBC WITH DIFFERENTIAL/PLATELET
Absolute Monocytes: 597 cells/uL (ref 200–950)
Basophils Absolute: 17 cells/uL (ref 0–200)
Basophils Relative: 0.3 %
Eosinophils Absolute: 81 cells/uL (ref 15–500)
Eosinophils Relative: 1.4 %
HCT: 40.1 % (ref 38.5–50.0)
Hemoglobin: 13.7 g/dL (ref 13.2–17.1)
Lymphs Abs: 1810 cells/uL (ref 850–3900)
MCH: 33.6 pg — ABNORMAL HIGH (ref 27.0–33.0)
MCHC: 34.2 g/dL (ref 32.0–36.0)
MCV: 98.3 fL (ref 80.0–100.0)
MPV: 10.7 fL (ref 7.5–12.5)
Monocytes Relative: 10.3 %
Neutro Abs: 3294 cells/uL (ref 1500–7800)
Neutrophils Relative %: 56.8 %
Platelets: 277 10*3/uL (ref 140–400)
RBC: 4.08 10*6/uL — ABNORMAL LOW (ref 4.20–5.80)
RDW: 11.4 % (ref 11.0–15.0)
Total Lymphocyte: 31.2 %
WBC: 5.8 10*3/uL (ref 3.8–10.8)

## 2020-01-18 LAB — TSH: TSH: 0.98 mIU/L (ref 0.40–4.50)

## 2020-02-17 ENCOUNTER — Other Ambulatory Visit (HOSPITAL_COMMUNITY)
Admission: RE | Admit: 2020-02-17 | Discharge: 2020-02-17 | Disposition: A | Discharge status: Home / Self Care | Payer: Medicare Other | Source: Ambulatory Visit | Attending: Surgery | Admitting: Surgery

## 2020-02-17 ENCOUNTER — Other Ambulatory Visit: Payer: Self-pay

## 2020-02-17 ENCOUNTER — Encounter (HOSPITAL_BASED_OUTPATIENT_CLINIC_OR_DEPARTMENT_OTHER): Payer: Self-pay | Admitting: Surgery

## 2020-02-17 DIAGNOSIS — Z01812 Encounter for preprocedural laboratory examination: Secondary | ICD-10-CM | POA: Insufficient documentation

## 2020-02-17 DIAGNOSIS — Z20822 Contact with and (suspected) exposure to covid-19: Secondary | ICD-10-CM | POA: Diagnosis not present

## 2020-02-17 LAB — SARS CORONAVIRUS 2 (TAT 6-24 HRS): SARS Coronavirus 2: NEGATIVE

## 2020-02-17 NOTE — Progress Notes (Signed)
Spoke w/ via phone for pre-op interview---pt Lab needs dos----    I stat            COVID test ------02-17-2020 at 1130 am Arrive at -------830 am 02-20-2020 NPO after MN NO Solid Food.  Clear liquids from MN until---730 am, drink 1 ensure pre surgery drink at 1000 pm night before surgery and 1 ensure presurgery drink at 730 am morning of surgery then npo Medications to take morning of surgery -----carvedilol, tamsulosin Diabetic medication -----none Patient Special Instructions -----see above Pre-Op special Istructions -----none Patient verbalized understanding of instructions that were given at this phone interview. Patient denies shortness of breath, chest pain, fever, cough at this phone interview.  Anesthesia Review: hx of pac's, nonsustained svt, prolonged qt on ekg, htn, patient states no chest pain since 09-30-2019 er visit  PCP: dr Georgina Snell nifiger lov 01-17-2020 epic Cardiologist :lov dr taylor 08-04-2016 follow up prn epic Chest x-ray :09-30-2019 epic EKG : 09-30-2019  Echo :04-02-2015 epic Stress test:abnormal 2017 Cardiac Cath : 04-09-2015 epic Activity level: does housework and climbs staits without dofficulty Sleep Study/ CPAP :none Fasting Blood Sugar :      / Checks Blood Sugar -- times a day:  n/a Blood Thinner/ Instructions /Last Dose:n/a ASA / Instructions/ Last Dose : staying on 81 mg aspirin per dr gross last dose will be 02-19-2020

## 2020-02-18 NOTE — Progress Notes (Signed)
Spoke with patient by phone and patient to call pcp and notify pcp he has flu like symptoms and patient to call dr gross and notify his office

## 2020-02-19 ENCOUNTER — Encounter: Payer: Self-pay | Admitting: Adult Health

## 2020-02-19 ENCOUNTER — Telehealth (INDEPENDENT_AMBULATORY_CARE_PROVIDER_SITE_OTHER): Payer: Medicare Other | Admitting: Adult Health

## 2020-02-19 ENCOUNTER — Other Ambulatory Visit: Payer: Self-pay

## 2020-02-19 VITALS — Temp 97.9°F | Wt 170.0 lb

## 2020-02-19 DIAGNOSIS — R6889 Other general symptoms and signs: Secondary | ICD-10-CM | POA: Diagnosis not present

## 2020-02-19 DIAGNOSIS — N200 Calculus of kidney: Secondary | ICD-10-CM | POA: Diagnosis not present

## 2020-02-19 MED ORDER — ONDANSETRON 4 MG PO TBDP
4.0000 mg | ORAL_TABLET | Freq: Three times a day (TID) | ORAL | 0 refills | Status: DC | PRN
Start: 2020-02-19 — End: 2021-08-30

## 2020-02-19 MED ORDER — OXYCODONE-ACETAMINOPHEN 5-325 MG PO TABS
1.0000 | ORAL_TABLET | ORAL | 0 refills | Status: DC | PRN
Start: 2020-02-19 — End: 2020-11-26

## 2020-02-19 NOTE — Progress Notes (Signed)
Virtual Visit via Telephone Note  I connected with [name] on 02/19/20 at  8:00 AM EST by telephone and verified that I am speaking with the correct person using two identifiers.   I discussed the limitations, risks, security and privacy concerns of performing an evaluation and management service by telephone and the availability of in person appointments. I also discussed with the patient that there may be a patient responsible charge related to this service. The patient expressed understanding and agreed to proceed.  Location patient: home Location provider: work or home office Participants present for the call: patient, provider Patient did not have a visit in the prior 7 days to address this/these issue(s).   History of Present Illness: 80 year old male who is being evaluated today for 2 separate issues.  First issue is an acute issue, reports that his symptoms started about 5 days ago.  Symptoms included chills, body ache, fatigue, a few episodes of diarrhea, nausea and vomiting.  Last time he had a vomiting episode was roughly 1 night ago.  Yesterday was the first day he is able to keep food down without vomiting.  He was tested for COVID-19 which resulted negative yesterday.  He is starting to feel slightly better.  Unfortunately, over the last few weeks he has passed multiple kidney stones.  Most recent kidney stone was within the last 24 hours.  Is having some low back pain, dysuria, and frequency from his kidney stones.  All typical symptoms of when he is passing kidney stones.  He is seen by [facility], has had lithotripsy and multiple surgeries on his kidneys.  Has not been seen in about a year though.  He plans on making up an appointment soon.  Taking Flomax, using over-the-counter pain medication but still continues to have discomfort.   Observations/Objective: Patient sounds cheerful and well on the phone. I do not appreciate any SOB. Speech and thought  processing are grossly intact. Patient reported vitals:  Assessment and Plan: 1. Flu-like symptoms  - ondansetron (ZOFRAN ODT) 4 MG disintegrating tablet; Take 1 tablet (4 mg total) by mouth every 8 (eight) hours as needed for nausea or vomiting.  Dispense: 20 tablet; Refill: 0  2. Kidney stones -Send in short course of Percocet to help with symptom relief.  Advised to continue with Flomax.  Stay hydrated.  Follow-up with urology.  If symptoms do not improve then follow-up in the office in the next week for urine sample - oxyCODONE-acetaminophen (PERCOCET) 5-325 MG tablet; Take 1 tablet by mouth every 4 (four) hours as needed for severe pain.  Dispense: 15 tablet; Refill: 0   Follow Up Instructions:  I did not refer this patient for an OV in the next 24 hours for this/these issue(s).  I discussed the assessment and treatment plan with the patient. The patient was provided an opportunity to ask questions and all were answered. The patient agreed with the plan and demonstrated an understanding of the instructions.   The patient was advised to call back or seek an in-person evaluation if the symptoms worsen or if the condition fails to improve as anticipated.  I provided 21 minutes of non-face-to-face time during this encounter.   Dorothyann Peng, NP

## 2020-02-20 ENCOUNTER — Ambulatory Visit (HOSPITAL_BASED_OUTPATIENT_CLINIC_OR_DEPARTMENT_OTHER): Admission: RE | Admit: 2020-02-20 | Payer: Medicare Other | Source: Home / Self Care | Admitting: Surgery

## 2020-02-20 HISTORY — DX: Other fatigue: R53.83

## 2020-02-20 HISTORY — DX: Presence of dental prosthetic device (complete) (partial): Z97.2

## 2020-02-20 HISTORY — DX: Pain, unspecified: R52

## 2020-02-20 SURGERY — REPAIR, HERNIA, VENTRAL, LAPAROSCOPIC
Anesthesia: General

## 2020-03-02 ENCOUNTER — Other Ambulatory Visit: Payer: Self-pay | Admitting: Adult Health

## 2020-03-02 DIAGNOSIS — I10 Essential (primary) hypertension: Secondary | ICD-10-CM

## 2020-03-03 NOTE — Telephone Encounter (Signed)
Sent to the pharmacy by e-scribe. 

## 2020-04-24 ENCOUNTER — Telehealth: Payer: Self-pay | Admitting: Adult Health

## 2020-04-24 NOTE — Telephone Encounter (Signed)
Left message for patient to call back and schedule Medicare Annual Wellness Visit (AWV) either virtually or in office. No detailed message left   Last AWV 09/27/17  please schedule at anytime with [facility] Nurse Health Advisor 1 or 2   This should be a 45 minute visit.

## 2020-05-01 NOTE — Progress Notes (Addendum)
Subjective:   [name] is a 80 y.o. male who presents for Medicare Annual/Subsequent preventive examination.  Review of Systems    N/A Cardiac Risk Factors include: advanced age (>23men, >55 women);male gender;hypertension     Objective:    Today's Vitals   05/04/20 0943 05/04/20 0944 05/04/20 0946  BP:  (!) 158/82   Pulse:  82   Temp: 97.9 F (36.6 C) 97.9 F (36.6 C)   SpO2:  96%   Weight: 176 lb 8 oz (80.1 kg) 176 lb 8 oz (80.1 kg)   PainSc:  5  5    Body mass index is 23.94 kg/m.  Advanced Directives 05/04/2020 09/30/2019 09/14/2019 08/10/2019 02/15/2018 09/27/2017 08/21/2017  Does Patient Have a Medical Advance Directive? Yes No No No No No No  Does patient want to make changes to medical advance directive? No - Patient declined - - - - - -  Would patient like information on creating a medical advance directive? - No - Patient declined - - - - No - Patient declined  Pre-existing out of facility DNR order (yellow form or pink MOST form) - - - - - - -    Current Medications (verified) Outpatient Encounter Medications as of 05/04/2020  Medication Sig  . aspirin EC 81 MG tablet Take 1 tablet (81 mg total) by mouth daily.  . carvedilol (COREG) 3.125 MG tablet TAKE 1 TABLET (3.125 MG TOTAL) BY MOUTH 2 (TWO) TIMES DAILY.  Marland Kitchen Cranberry 1000 MG CAPS Take 1,000 mg by mouth daily.   Marland Kitchen ibuprofen (ADVIL,MOTRIN) 200 MG tablet Take 600 mg by mouth 2 (two) times daily.   Marland Kitchen lisinopril (ZESTRIL) 10 MG tablet TAKE 1 TABLET BY MOUTH EVERY DAY  . Misc Natural Products (OSTEO BI-FLEX ADV JOINT SHIELD PO) Take 2 capsules by mouth daily.  . Multiple Vitamin (MULTIVITAMIN) tablet Take 1 tablet by mouth daily.  . ondansetron (ZOFRAN ODT) 4 MG disintegrating tablet Take 1 tablet (4 mg total) by mouth every 8 (eight) hours as needed for nausea or vomiting.  . tamsulosin (FLOMAX) 0.4 MG CAPS capsule TAKE 1 CAPSULE BY MOUTH EVERY DAY  . meloxicam (MOBIC) 15 MG tablet Take 1 tablet (15 mg total) by  mouth daily. (Patient not taking: Reported on 05/04/2020)  . oxyCODONE-acetaminophen (PERCOCET) 5-325 MG tablet Take 1 tablet by mouth every 4 (four) hours as needed for severe pain. (Patient not taking: Reported on 05/04/2020)   No facility-administered encounter medications on file as of 05/04/2020.    Allergies (verified) Other, Prozac [fluoxetine hcl], Sulfa antibiotics, Vicodin [hydrocodone-acetaminophen], and Erythromycin   History: Past Medical History:  Diagnosis Date  . Arthritis    HANDS AND PT STATES HIS ARMS HURT  . 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 Colonic mass 2001  . Fatigue    x 1 week o as of 02-17-2020  . Fibromyalgia 1990's   . Frequent PVCs   . Generalized body aches    x 1 week as of 02-17-2020  . H/O inguinal hernia    right side  . Hernia, umbilical   . History of kidney stones    HX OF MULTIPLE KIDNEY STONES AND  CURRENTLY HAS BILATERAL RENAL STONES CAUSING ABDOMINAL AND BACK PAIN  . Hypertension    high blood pressure readings   . NSVT (nonsustained ventricular tachycardia) ([place])   . Premature atrial contractions   . Prolonged Q-T  interval on ECG    hx of none currently  . Wears dentures    full dentures   Past Surgical History:  Procedure Laterality Date  . BRAIN TUMOR EXCISION  age 68  . CARDIAC CATHETERIZATION N/A 04/09/2015   Procedure: Left Heart Cath and Coronary Angiography;  Surgeon: Burnell Blanks, MD;  Location: [facility];  Service: Cardiovascular;  Laterality: N/A;  . COLON SURGERY  2001   COLON RESECTION FOR GROWTH - NOT CANCER  . CYSTOSCOPY WITH RETROGRADE PYELOGRAM, URETEROSCOPY AND STENT PLACEMENT Left 10/05/2012   Procedure: CYSTOSCOPY WITH RETROGRADE PYELOGRAM, URETEROSCOPY AND STENT PLACEMENT;  Surgeon: Alexis Frock, MD;  Location: [facility];  Service: Urology;  Laterality: Left;  . CYSTOSCOPY/RETROGRADE/URETEROSCOPY/STONE EXTRACTION WITH BASKET  Right 10/05/2012   Procedure: CYSTOSCOPY/RETROGRADE/URETEROSCOPY/STONE EXTRACTION WITH BASKET;  Surgeon: Alexis Frock, MD;  Location: [facility];  Service: Urology;  Laterality: Right;  . CYSTOSCOPY/RETROGRADE/URETEROSCOPY/STONE EXTRACTION WITH BASKET Bilateral 10/24/2012   Procedure: CYSTOSCOPY/RETROGRADE/URETEROSCOPY/STONE EXTRACTION WITH BASKET;  Surgeon: Alexis Frock, MD;  Location: [facility];  Service: Urology;  Laterality: Bilateral;  with bilateral stent placement  . HOLMIUM LASER APPLICATION Right 08/03/348   Procedure: HOLMIUM LASER APPLICATION;  Surgeon: Alexis Frock, MD;  Location: [facility];  Service: Urology;  Laterality: Right;  . HOLMIUM LASER APPLICATION Bilateral 0/93/8182   Procedure: HOLMIUM LASER APPLICATION;  Surgeon: Alexis Frock, MD;  Location: [facility];  Service: Urology;  Laterality: Bilateral;  . LITHOTRIPSY    . TONSILLECTOMY  age 35's   and adenoids   Family History  Problem Relation Age of Onset  . Colon cancer Father   . Heart disease Father        quad bypass   Social History   Socioeconomic History  . Marital status: Single    Spouse name: Not on file  . Number of children: Not on file  . Years of education: Not on file  . Highest education level: Not on file  Occupational History  . Not on file  Tobacco Use  . Smoking status: Never Smoker  . Smokeless tobacco: Never Used  Vaping Use  . Vaping Use: Never used  Substance and Sexual Activity  . Alcohol use: Yes    Alcohol/week: 7.0 standard drinks    Types: 7 Standard drinks or equivalent per week    Comment: 1 drink per day  . Drug use: Not Currently  . Sexual activity: Not on file  Other Topics Concern  . Not on file  Social History Narrative   Retired from Museum/gallery curator    Has one son - lives Social worker to play Tennis   Social Determinants of Radio broadcast assistant Strain: Not on file  Food Insecurity: Not on file  Transportation Needs: Not on file  Physical Activity: Not on file   Stress: Not on file  Social Connections: Not on file    Tobacco Counseling Counseling given: Not Answered   Clinical Intake:  Pre-visit preparation completed: Yes  Pain : 0-10 Pain Score: 5  Pain Type: (S) Chronic pain (Fibromyalgia flard with cold weather) Pain Location: Flank (Fibromyalgia flard with cold weather) Pain Orientation: Other (Comment) (Fibromyalgia flard with cold weather) Pain Descriptors / Indicators: Other (Comment) (Fibromyalgia flard with cold weather) Pain Onset: More than a month ago (Fibromyalgia flard with cold weather) Pain Frequency: Constant Pain Relieving Factors: Otc pain Medication  Pain Relieving Factors: Otc pain Medication  Nutritional Risks: None Diabetes: No  How often do you need to have someone  help you when you read instructions, pamphlets, or other written materials from your doctor or pharmacy?: 1 - Never What is the last grade level you completed in school?: High School  Diabetic?NO  Interpreter Needed?: No  Information entered by :: L.Bonham Zingale,LPN   Activities of Daily Living In your present state of health, do you have any difficulty performing the following activities: 05/04/2020  Hearing? N  Vision? N  Difficulty concentrating or making decisions? N  Walking or climbing stairs? N  Dressing or bathing? N  Doing errands, shopping? N  Preparing Food and eating ? N  Using the Toilet? N  In the past six months, have you accidently leaked urine? N  Do you have problems with loss of bowel control? N  Managing your Medications? N  Managing your Finances? N  Housekeeping or managing your Housekeeping? N  Some recent data might be hidden    Patient Care Team: Dorothyann Peng, NP as PCP - General (Family Medicine) Evans Lance, MD as Consulting Physician (Cardiology)  Indicate any recent Medical Services you may have received from other than [facility] providers in the past year (date may be approximate).     Assessment:    This is a routine wellness examination for [place].  Hearing/Vision screen  Hearing Screening   125Hz  250Hz  500Hz  1000Hz  2000Hz  3000Hz  4000Hz  6000Hz  8000Hz   Right ear:           Left ear:           Vision Screening Comments: Pt wear reading glasses  Dietary issues and exercise activities discussed: Current Exercise Habits: Structured exercise class, Type of exercise: Other - see comments (plays tennis 3 times a week), Time (Minutes): 60, Frequency (Times/Week): 3, Weekly Exercise (Minutes/Week): 180, Intensity: Intense, Exercise limited by: neurologic condition(s)  Goals    . Exercise 3x per week (30 min per time)     Play tennis 3 times a week     . Patient Stated     Keep playing tennis       Depression Screen PHQ 2/9 Scores 05/04/2020 01/17/2020 09/27/2017 09/27/2017 08/19/2016 02/03/2015  PHQ - 2 Score 0 0 0 0 0 0    Fall Risk Fall Risk  05/04/2020 01/17/2020 01/02/2019 09/27/2017 09/27/2017  Falls in the past year? 0 0 0 No No  Comment - - Emmi Telephone Survey: data to providers prior to load - -  Injury with Fall? 0 - - - -  Follow up Falls evaluation completed - - - -    FALL RISK PREVENTION PERTAINING TO THE HOME:  Any stairs in or around the home? No  If so, are there any without handrails? Yes  Home free of loose throw rugs in walkways, pet beds, electrical cords, etc? Yes  Adequate lighting in your home to reduce risk of falls? Yes   ASSISTIVE DEVICES UTILIZED TO PREVENT FALLS:  Life alert? No  Use of a cane, walker or w/c? No  Grab bars in the bathroom? No  Shower chair or bench in shower? No  Elevated toilet seat or a handicapped toilet? No   TIMED UP AND GO:  Was the test performed? Yes .  Length of time to ambulate 10 feet: 6 sec.   Gait steady and fast without use of assistive device  Cognitive Function: MMSE - Mini Mental State Exam 09/27/2017  Not completed: (No Data)        Immunizations Immunization History  Administered Date(s)  Administered  . Influenza, High Dose  Seasonal PF 02/03/2015  . Moderna Sars-Covid-2 Vaccination 12/28/2019  . Pneumococcal Conjugate-13 02/03/2015  . Pneumococcal Polysaccharide-23 08/19/2016    TDAP status: Due, Education has been provided regarding the importance of this vaccine. Advised may receive this vaccine at local pharmacy or Health Dept. Aware to provide a copy of the vaccination record if obtained from local pharmacy or Health Dept. Verbalized acceptance and understanding.  Flu Vaccine status: Due, Education has been provided regarding the importance of this vaccine. Advised may receive this vaccine at local pharmacy or Health Dept. Aware to provide a copy of the vaccination record if obtained from local pharmacy or Health Dept. Verbalized acceptance and understanding.  Pneumococcal vaccine status: Due, Education has been provided regarding the importance of this vaccine. Advised may receive this vaccine at local pharmacy or Health Dept. Aware to provide a copy of the vaccination record if obtained from local pharmacy or Health Dept. Verbalized acceptance and understanding.  Covid-19 vaccine status: Completed vaccines  Qualifies for Shingles Vaccine? Yes   Zostavax completed No   Shingrix Completed?: No.    Education has been provided regarding the importance of this vaccine. Patient has been advised to call insurance company to determine out of pocket expense if they have not yet received this vaccine. Advised may also receive vaccine at local pharmacy or Health Dept. Verbalized acceptance and understanding.  Screening Tests Health Maintenance  Topic Date Due  . Hepatitis C Screening  Never done  . TETANUS/TDAP  Never done  . COVID-19 Vaccine (2 - Moderna 3-dose series) 01/25/2020  . INFLUENZA VACCINE  05/07/2020 (Originally 09/08/2019)  . PNA vac Low Risk Adult  Completed  . HPV VACCINES  Aged Out    Health Maintenance  Health Maintenance Due  Topic Date Due  . Hepatitis  C Screening  Never done  . TETANUS/TDAP  Never done  . COVID-19 Vaccine (2 - Moderna 3-dose series) 01/25/2020    Colorectal cancer screening: No longer required.   Lung Cancer Screening: (Low Dose CT Chest recommended if Age 8-80 years, 30 pack-year currently smoking OR have quit w/in 15years.) does not qualify.   Lung Cancer Screening Referral: N/A  Additional Screening:  Hepatitis C Screening: does qualify  Vision Screening: Recommended annual ophthalmology exams for early detection of glaucoma and other disorders of the eye. Is the patient up to date with their annual eye exam?  No  Who is the provider or what is the name of the office in which the patient attends annual eye exams? Pt requesting referal If pt is not established with a provider, would they like to be referred to a provider to establish care? Yes .   Dental Screening: Recommended annual dental exams for proper oral hygiene  Community Resource Referral / Chronic Care Management: CRR required this visit?  No   CCM required this visit?  No      Plan:     I have personally reviewed and noted the following in the patient's chart:   . Medical and social history . Use of alcohol, tobacco or illicit drugs  . Current medications and supplements . Functional ability and status . Nutritional status . Physical activity . Advanced directives . List of other physicians . Hospitalizations, surgeries, and ER visits in previous 12 months . Vitals . Screenings to include cognitive, depression, and falls . Referrals and appointments  In addition, I have reviewed and discussed with patient certain preventive protocols, quality metrics, and best practice recommendations. A written personalized care plan  for preventive services as well as general preventive health recommendations were provided to patient.     Randel Pigg, LPN   2/70/3500   Nurse Notes: None

## 2020-05-04 ENCOUNTER — Ambulatory Visit (INDEPENDENT_AMBULATORY_CARE_PROVIDER_SITE_OTHER): Payer: Medicare Other

## 2020-05-04 ENCOUNTER — Other Ambulatory Visit: Payer: Self-pay

## 2020-05-04 VITALS — BP 158/82 | HR 82 | Temp 97.9°F | Wt 176.5 lb

## 2020-05-04 DIAGNOSIS — Z0100 Encounter for examination of eyes and vision without abnormal findings: Secondary | ICD-10-CM

## 2020-05-04 DIAGNOSIS — Z1211 Encounter for screening for malignant neoplasm of colon: Secondary | ICD-10-CM | POA: Diagnosis not present

## 2020-05-04 DIAGNOSIS — Z0000 Encounter for general adult medical examination without abnormal findings: Secondary | ICD-10-CM

## 2020-05-04 NOTE — Patient Instructions (Signed)
[name] , Thank you for taking time to come for your Medicare Wellness Visit. I appreciate your ongoing commitment to your health goals. Please review the following plan we discussed and let me know if I can assist you in the future.   Screening recommendations/referrals: Colonoscopy: referral sent  Recommended yearly ophthalmology/optometry visit for glaucoma screening and checkup Recommended yearly dental visit for hygiene and checkup  Vaccinations: Influenza vaccine:Completed- due next Fall Pneumococcal vaccine: Completed Tdap vaccine: Obtain with injury  Shingles vaccine: Obtain from pharmacy     Advanced directives: Pt to provide copies   Conditions/risks identified: none   Next appointment: None   Preventive Care 43 Years and Older, Male Preventive care refers to lifestyle choices and visits with your health care provider that can promote health and wellness. What does preventive care include?  A yearly physical exam. This is also called an annual well check.  Dental exams once or twice a year.  Routine eye exams. Ask your health care provider how often you should have your eyes checked.  Personal lifestyle choices, including:  Daily care of your teeth and gums.  Regular physical activity.  Eating a healthy diet.  Avoiding tobacco and drug use.  Limiting alcohol use.  Practicing safe sex.  Taking low doses of aspirin every day.  Taking vitamin and mineral supplements as recommended by your health care provider. What happens during an annual well check? The services and screenings done by your health care provider during your annual well check will depend on your age, overall health, lifestyle risk factors, and family history of disease. Counseling  Your health care provider may ask you questions about your:  Alcohol use.  Tobacco use.  Drug use.  Emotional well-being.  Home and relationship well-being.  Sexual activity.  Eating  habits.  History of falls.  Memory and ability to understand (cognition).  Work and work Statistician. Screening  You may have the following tests or measurements:  Height, weight, and BMI.  Blood pressure.  Lipid and cholesterol levels. These may be checked every 5 years, or more frequently if you are over 106 years old.  Skin check.  Lung cancer screening. You may have this screening every year starting at age 10 if you have a 30-pack-year history of smoking and currently smoke or have quit within the past 15 years.  Fecal occult blood test (FOBT) of the stool. You may have this test every year starting at age 83.  Flexible sigmoidoscopy or colonoscopy. You may have a sigmoidoscopy every 5 years or a colonoscopy every 10 years starting at age 61.  Prostate cancer screening. Recommendations will vary depending on your family history and other risks.  Hepatitis C blood test.  Hepatitis B blood test.  Sexually transmitted disease (STD) testing.  Diabetes screening. This is done by checking your blood sugar (glucose) after you have not eaten for a while (fasting). You may have this done every 1-3 years.  Abdominal aortic aneurysm (AAA) screening. You may need this if you are a current or former smoker.  Osteoporosis. You may be screened starting at age 66 if you are at high risk. Talk with your health care provider about your test results, treatment options, and if necessary, the need for more tests. Vaccines  Your health care provider may recommend certain vaccines, such as:  Influenza vaccine. This is recommended every year.  Tetanus, diphtheria, and acellular pertussis (Tdap, Td) vaccine. You may need a Td booster every 10 years.  Zoster  vaccine. You may need this after age 30.  Pneumococcal 13-valent conjugate (PCV13) vaccine. One dose is recommended after age 40.  Pneumococcal polysaccharide (PPSV23) vaccine. One dose is recommended after age 72. Talk to your health  care provider about which screenings and vaccines you need and how often you need them. This information is not intended to replace advice given to you by your health care provider. Make sure you discuss any questions you have with your health care provider. Document Released: 02/20/2015 Document Revised: 10/14/2015 Document Reviewed: 11/25/2014 Elsevier Interactive Patient Education  2017 [place] Prevention in the Home Falls can cause injuries. They can happen to people of all ages. There are many things you can do to make your home safe and to help prevent falls. What can I do on the outside of my home?  Regularly fix the edges of walkways and driveways and fix any cracks.  Remove anything that might make you trip as you walk through a door, such as a raised step or threshold.  Trim any bushes or trees on the path to your home.  Use bright outdoor lighting.  Clear any walking paths of anything that might make someone trip, such as rocks or tools.  Regularly check to see if handrails are loose or broken. Make sure that both sides of any steps have handrails.  Any raised decks and porches should have guardrails on the edges.  Have any leaves, snow, or ice cleared regularly.  Use sand or salt on walking paths during winter.  Clean up any spills in your garage right away. This includes oil or grease spills. What can I do in the bathroom?  Use night lights.  Install grab bars by the toilet and in the tub and shower. Do not use towel bars as grab bars.  Use non-skid mats or decals in the tub or shower.  If you need to sit down in the shower, use a plastic, non-slip stool.  Keep the floor dry. Clean up any water that spills on the floor as soon as it happens.  Remove soap buildup in the tub or shower regularly.  Attach bath mats securely with double-sided non-slip rug tape.  Do not have throw rugs and other things on the floor that can make you trip. What can I do  in the bedroom?  Use night lights.  Make sure that you have a light by your bed that is easy to reach.  Do not use any sheets or blankets that are too big for your bed. They should not hang down onto the floor.  Have a firm chair that has side arms. You can use this for support while you get dressed.  Do not have throw rugs and other things on the floor that can make you trip. What can I do in the kitchen?  Clean up any spills right away.  Avoid walking on wet floors.  Keep items that you use a lot in easy-to-reach places.  If you need to reach something above you, use a strong step stool that has a grab bar.  Keep electrical cords out of the way.  Do not use floor polish or wax that makes floors slippery. If you must use wax, use non-skid floor wax.  Do not have throw rugs and other things on the floor that can make you trip. What can I do with my stairs?  Do not leave any items on the stairs.  Make sure that there are handrails  on both sides of the stairs and use them. Fix handrails that are broken or loose. Make sure that handrails are as long as the stairways.  Check any carpeting to make sure that it is firmly attached to the stairs. Fix any carpet that is loose or worn.  Avoid having throw rugs at the top or bottom of the stairs. If you do have throw rugs, attach them to the floor with carpet tape.  Make sure that you have a light switch at the top of the stairs and the bottom of the stairs. If you do not have them, ask someone to add them for you. What else can I do to help prevent falls?  Wear shoes that:  Do not have high heels.  Have rubber bottoms.  Are comfortable and fit you well.  Are closed at the toe. Do not wear sandals.  If you use a stepladder:  Make sure that it is fully opened. Do not climb a closed stepladder.  Make sure that both sides of the stepladder are locked into place.  Ask someone to hold it for you, if possible.  Clearly mark  and make sure that you can see:  Any grab bars or handrails.  First and last steps.  Where the edge of each step is.  Use tools that help you move around (mobility aids) if they are needed. These include:  Canes.  Walkers.  Scooters.  Crutches.  Turn on the lights when you go into a dark area. Replace any light bulbs as soon as they burn out.  Set up your furniture so you have a clear path. Avoid moving your furniture around.  If any of your floors are uneven, fix them.  If there are any pets around you, be aware of where they are.  Review your medicines with your doctor. Some medicines can make you feel dizzy. This can increase your chance of falling. Ask your doctor what other things that you can do to help prevent falls. This information is not intended to replace advice given to you by your health care provider. Make sure you discuss any questions you have with your health care provider. Document Released: 11/20/2008 Document Revised: 07/02/2015 Document Reviewed: 02/28/2014 Elsevier Interactive Patient Education  2017 [employer].

## 2020-07-08 ENCOUNTER — Other Ambulatory Visit: Payer: Self-pay | Admitting: Adult Health

## 2020-07-08 DIAGNOSIS — R6889 Other general symptoms and signs: Secondary | ICD-10-CM

## 2020-07-09 ENCOUNTER — Other Ambulatory Visit: Payer: Self-pay

## 2020-07-10 ENCOUNTER — Ambulatory Visit: Payer: Medicare Other | Admitting: Adult Health

## 2020-07-10 DIAGNOSIS — Z0289 Encounter for other administrative examinations: Secondary | ICD-10-CM

## 2020-09-06 ENCOUNTER — Other Ambulatory Visit: Payer: Self-pay | Admitting: Adult Health

## 2020-09-06 DIAGNOSIS — I10 Essential (primary) hypertension: Secondary | ICD-10-CM

## 2020-11-26 ENCOUNTER — Other Ambulatory Visit: Payer: Self-pay

## 2020-11-26 ENCOUNTER — Encounter: Payer: Self-pay | Admitting: Family Medicine

## 2020-11-26 ENCOUNTER — Telehealth (INDEPENDENT_AMBULATORY_CARE_PROVIDER_SITE_OTHER): Payer: Medicare Other | Admitting: Family Medicine

## 2020-11-26 DIAGNOSIS — U071 COVID-19: Secondary | ICD-10-CM

## 2020-11-26 MED ORDER — BENZONATATE 100 MG PO CAPS: ORAL_CAPSULE | ORAL | 0 refills | Status: DC

## 2020-11-26 MED ORDER — NIRMATRELVIR/RITONAVIR (PAXLOVID)TABLET: 3.0000 | ORAL_TABLET | Freq: Two times a day (BID) | ORAL | 0 refills | Status: AC

## 2020-11-26 NOTE — Progress Notes (Signed)
Virtual Visit via Telephone Note  I connected with [name] on 11/26/20 at 12:40 PM EDT by telephone and verified that I am speaking with the correct person using two identifiers.   I discussed the limitations, risks, security and privacy concerns of performing an evaluation and management service by telephone and the availability of in person appointments. I also discussed with the patient that there may be a patient responsible charge related to this service. The patient expressed understanding and agreed to proceed.  Location patient: home, [place] Location provider: work or home office Participants present for the call: patient, provider Patient did not have a visit with me in the prior 7 days to address this/these issue(s).   History of Present Illness:  Acute telemedicine visit for Covid19: -Onset: yesterday and had a positive home test x2 today -wife has had covid the last several days -Symptoms include: nasal congestion, cough, little headache -Denies: fever, CP, SOB, NVD, inability to eat/drink/get out of bed -Has tried: advil, cough drops -Pertinent past medical history: see below -Pertinent medication allergies:  Allergies  Allergen Reactions   Other Other (See Comments)    Mangos - caused a severe rash   Prozac [Fluoxetine Hcl]     Made him feel "loopy"   Sulfa Antibiotics Hives   Vicodin [Hydrocodone-Acetaminophen] Itching    Tolerates oxycodone   Erythromycin Rash    Many years ago an oral antibiotic caused a rash(wife and patient think it was erythromycin but they are not positive).  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 - occas slight headaches since the surgery   Colonic mass 2001   Fatigue    x 1 week o as of 02-17-2020   Fibromyalgia 1990's    Frequent PVCs    Generalized body aches    x 1 week as of 02-17-2020   H/O inguinal hernia    right side   Hernia, umbilical    History of kidney stones    HX OF MULTIPLE KIDNEY STONES AND  CURRENTLY HAS BILATERAL RENAL STONES CAUSING ABDOMINAL AND BACK PAIN   Hypertension    high blood pressure readings    NSVT (nonsustained ventricular tachycardia)    Premature atrial contractions    Prolonged Q-T interval on ECG    hx of none currently   Wears dentures    full dentures      Observations/Objective: Patient sounds cheerful and well on the phone. I do not appreciate any SOB. Speech and thought processing are grossly intact. Patient reported vitals:  Assessment and Plan:  COVID-19   Discussed treatment options (infusions and oral options and risk of drug interactions), ideal treatment window, potential complications, isolation and precautions for COVID-19.  Discussed possibility of rebound with antivirals and the need to reisolate if it should occur for 5 days. Checked for/reviewed any labs done  with GFR listed in HPI if available. After lengthy discussion, the patient opted for treatment with PAxlovid due to being higher risk for complications of covid or severe disease and  other factors. He did not wish to go get updated labs and was ok with doing this as feels kidney function has been stable over time. Discussed EUA status of this drug and the fact that there is preliminary limited knowledge of risks/interactions/side effects per EUA document vs possible benefits and precautions. This information was shared with patient during the visit and also was provided in patient instructions. The patient did want a prescription for cough,  Tessalon Rx sent.  Other symptomatic care measures summarized in patient instructions. Work/School slipped offered:  declined Advised to seek prompt in person care if worsening, new symptoms arise, or if is not improving with treatment. Advised of options for inperson care in case PCP office not available. Did let the patient know that I only do telemedicine shifts for [facility] on Tuesdays and Thursdays and advised a follow up visit with PCP or at an [facility] if has further questions or concerns.   Follow Up Instructions:  I did not refer this patient for an OV with me in the next 24 hours for this/these issue(s).  I discussed the assessment and treatment plan with the patient. The patient was provided an opportunity to ask questions and all were answered. The patient agreed with the plan and demonstrated an understanding of the instructions.   I spent 17 minutes on the date of this visit in the care of this patient. See summary of tasks completed to properly care for this patient in the detailed notes above which also included counseling of above, review of PMH, medications, allergies, evaluation of the patient and ordering and/or  instructing patient on testing and care options.     Lucretia Kern, DO

## 2020-11-26 NOTE — Patient Instructions (Signed)
HOME CARE TIPS:  -COVID19 testing information: [email or web address]  Most pharmacies also offer testing and home test kits. If the Covid19 test is positive and you desire antiviral treatment, please contact a [facility] or schedule a follow up virtual visit through your primary care office or through the [employer] [facility].  Other test to treat options: [email or web address]  -I sent the medication(s) we discussed to your pharmacy: Meds ordered this encounter  Medications   nirmatrelvir/ritonavir EUA (PAXLOVID) 20 x 150 MG & 10 x 100MG TABS    Sig: Take 3 tablets by mouth 2 (two) times daily for 5 days. (Take nirmatrelvir 150 mg two tablets twice daily for 5 days and ritonavir 100 mg one tablet twice daily for 5 days) Patient GFR is 79 about 10 months ago    Dispense:  30 tablet    Refill:  0   benzonatate (TESSALON PERLES) 100 MG capsule    Sig: 1-2 capsules twice daily as needed for cough    Dispense:  30 capsule    Refill:  0     -I sent in the [facility] treatment or referral you requested per our discussion. Please see the information provided below and discuss further with the pharmacist/treatment team.  -If taking Paxlovid, please review all medications, supplement and over the counter drugs with your pharmacist and ask them to check for any interactions.  -there is a chance of rebound illness after finishing your treatment. If you become sick again please isolate for an additional 5 days, plus 5 more days of masking.   -can use tylenol f needed for fevers, aches and pains per instructions  -can use nasal saline a few times per day if you have nasal congestion; sometimes  a short course of Afrin nasal spray for 3 days can help with symptoms as well  -stay hydrated, drink plenty of fluids and eat small healthy meals - avoid dairy  -can take 1000 IU (69mg) Vit D3 and 100-500 mg of Vit C daily per instructions  -If  the Covid test is positive, check out the [facility] for more information on home care, transmission and treatment for COVID19  -follow up with your doctor in 2-3 days unless improving and feeling better  -stay home while sick, except to seek medical care. If you have COVID19, ideally it would be best to stay home for a full 10 days since the onset of symptoms PLUS one day of no fever and feeling better. Wear a good mask that fits snugly (such as N95 or KN95) if around others to reduce the risk of transmission.  It was nice to meet you today, and I really hope you are feeling better soon. I help [facility] out with telemedicine visits on Tuesdays and Thursdays and am available for visits on those days. If you have any concerns or questions following this visit please schedule a follow up visit with your Primary Care doctor or seek care at a local urgent care clinic to avoid delays in care.    Seek in person care or schedule a follow up video visit promptly if your symptoms worsen, new concerns arise or you are not improving with treatment. Call 911 and/or seek emergency care if your symptoms are severe or life threatening.  FACT SHEET FOR PATIENTS, PARENTS, AND CAREGIVERS EMERGENCY USE AUTHORIZATION (EUA) OF PAXLOVID FOR CORONAVIRUS DISEASE 2019 (COVID-19) You are being given this Fact Sheet because your healthcare provider believes it is necessary to provide you with PAXLOVID for the treatment  of mild-to-moderate coronavirus disease (COVID-19) caused by the SARS-CoV-2 virus. This Fact Sheet contains information to help you understand the risks and benefits of taking the PAXLOVID you have received or may receive. The U.S. Food and Drug Administration (FDA) has issued an Emergency Use Authorization (EUA) to make PAXLOVID available during the COVID-19 pandemic (for more details about an EUA please see "What is an Emergency Use Authorization?" at the end of this document). PAXLOVID is not an  FDA-approved medicine in the [place]. Read this Fact Sheet for information about PAXLOVID. Talk to your healthcare provider about your options or if you have any questions. It is your choice to take PAXLOVID.  What is COVID-19? COVID-19 is caused by a virus called a coronavirus. You can get COVID-19 through close contact with another person who has the virus. COVID-19 illnesses have ranged from very mild-to-severe, including illness resulting in death. While information so far suggests that most COVID-19 illness is mild, serious illness can happen and may cause some of your other medical conditions to become worse. Older people and people of all ages with severe, long lasting (chronic) medical conditions like heart disease, lung disease, and diabetes, for example seem to be at higher risk of being hospitalized for COVID-19.  What is PAXLOVID? PAXLOVID is an investigational medicine used to treat mild-to-moderate COVID-19 in adults and children [33 years of age and older weighing at least 53 pounds (21 kg)] with positive results of direct SARS-CoV-2 viral testing, and who are at high risk for progression to severe COVID-19, including hospitalization or death. PAXLOVID is investigational because it is still being studied. There is limited information about the safety and effectiveness of using PAXLOVID to treat people with mild-to-moderate COVID-19.  The FDA has authorized the emergency use of PAXLOVID for the treatment of mild-tomoderate COVID-19 in adults and children [60 years of age and older weighing at least 72 pounds (62 kg)] with a positive test for the virus that causes COVID-19, and who are at high risk for progression to severe COVID-19, including hospitalization or death, under an EUA. 1 Revised: 24 April 2020   What should I tell my healthcare provider before I take PAXLOVID? Tell your healthcare provider if you: ? Have any allergies ? Have liver or kidney disease ?  Are pregnant or plan to become pregnant ? Are breastfeeding a child ? Have any serious illnesses  Tell your healthcare provider about all the medicines you take, including prescription and over-the-counter medicines, vitamins, and herbal supplements. Some medicines may interact with PAXLOVID and may cause serious side effects. Keep a list of your medicines to show your healthcare provider and pharmacist when you get a new medicine.  You can ask your healthcare provider or pharmacist for a list of medicines that interact with PAXLOVID. Do not start taking a new medicine without telling your healthcare provider. Your healthcare provider can tell you if it is safe to take PAXLOVID with other medicines.  Tell your healthcare provider if you are taking combined hormonal contraceptive. PAXLOVID may affect how your birth control pills work. Females who are able to become pregnant should use another effective alternative form of contraception or an additional barrier method of contraception. Talk to your healthcare provider if you have any questions about contraceptive methods that might be right for you.  How do I take PAXLOVID? ? PAXLOVID consists of 2 medicines: nirmatrelvir and ritonavir. o Take 2 pink tablets of nirmatrelvir with 1 white tablet of ritonavir by mouth 2  times each day (in the morning and in the evening) for 5 days. For each dose, take all 3 tablets at the same time. o If you have kidney disease, talk to your healthcare provider. You may need a different dose. ? Swallow the tablets whole. Do not chew, break, or crush the tablets. ? Take PAXLOVID with or without food. ? Do not stop taking PAXLOVID without talking to your healthcare provider, even if you feel better. ? If you miss a dose of PAXLOVID within 8 hours of the time it is usually taken, take it as soon as you remember. If you miss a dose by more than 8 hours, skip the missed dose and take the next dose at your  regular time. Do not take 2 doses of PAXLOVID at the same time. ? If you take too much PAXLOVID, call your healthcare provider or go to the nearest hospital emergency room right away. ? If you are taking a ritonavir- or cobicistat-containing medicine to treat hepatitis C or Human Immunodeficiency Virus (HIV), you should continue to take your medicine as prescribed by your healthcare provider. 2 Revised: 24 April 2020    Talk to your healthcare provider if you do not feel better or if you feel worse after 5 days.  Who should generally not take PAXLOVID? Do not take PAXLOVID if: ? You are allergic to nirmatrelvir, ritonavir, or any of the ingredients in PAXLOVID. ? You are taking any of the following medicines: o Alfuzosin o Pethidine, propoxyphene o Ranolazine o Amiodarone, dronedarone, flecainide, propafenone, quinidine o Colchicine o Lurasidone, pimozide, clozapine o Dihydroergotamine, ergotamine, methylergonovine o Lovastatin, simvastatin o Sildenafil (Revatio) for pulmonary arterial hypertension (PAH) o Triazolam, oral midazolam o Apalutamide o Carbamazepine, phenobarbital, phenytoin o Rifampin o St. John's Wort (hypericum perforatum) Taking PAXLOVID with these medicines may cause serious or life-threatening side effects or affect how PAXLOVID works.  These are not the only medicines that may cause serious side effects if taken with PAXLOVID. PAXLOVID may increase or decrease the levels of multiple other medicines. It is very important to tell your healthcare provider about all of the medicines you are taking because additional laboratory tests or changes in the dose of your other medicines may be necessary while you are taking PAXLOVID. Your healthcare provider may also tell you about specific symptoms to watch out for that may indicate that you need to stop or decrease the dose of some of your other medicines.  What are the important possible side effects of  PAXLOVID? Possible side effects of PAXLOVID are: ? Allergic Reactions. Allergic reactions can happen in people taking PAXLOVID, even after only 1 dose. Stop taking PAXLOVID and call your healthcare provider right away if you get any of the following symptoms of an allergic reaction: o hives o trouble swallowing or breathing o swelling of the mouth, lips, or face o throat tightness o hoarseness 3 Revised: 24 April 2020  o skin rash ? Liver Problems. Tell your healthcare provider right away if you have any of these signs and symptoms of liver problems: loss of appetite, yellowing of your skin and the whites of eyes (jaundice), dark-colored urine, pale colored stools and itchy skin, stomach area (abdominal) pain. ? Resistance to HIV Medicines. If you have untreated HIV infection, PAXLOVID may lead to some HIV medicines not working as well in the future. ? Other possible side effects include: o altered sense of taste o diarrhea o high blood pressure o muscle aches These are not all the  possible side effects of PAXLOVID. Not many people have taken PAXLOVID. Serious and unexpected side effects may happen. PAXLOVID is still being studied, so it is possible that all of the risks are not known at this time.  What other treatment choices are there? Veklury (remdesivir) is FDA-approved for the treatment of mild-to-moderate PYYFR-10 in certain adults and children. Talk with your doctor to see if Marijean Heath is appropriate for you. Like PAXLOVID, FDA may also allow for the emergency use of other medicines to treat people with COVID-19. Go to https://price.info/ for information on the emergency use of other medicines that are authorized by FDA to treat people with COVID-19. Your healthcare provider may talk with you about clinical trials for which you may be eligible. It is your choice to be  treated or not to be treated with PAXLOVID. Should you decide not to receive it or for your child not to receive it, it will not change your standard medical care.  What if I am pregnant or breastfeeding? There is no experience treating pregnant women or breastfeeding mothers with PAXLOVID. For a mother and unborn baby, the benefit of taking PAXLOVID may be greater than the risk from the treatment. If you are pregnant, discuss your options and specific situation with your healthcare provider. It is recommended that you use effective barrier contraception or do not have sexual activity while taking PAXLOVID. If you are breastfeeding, discuss your options and specific situation with your healthcare provider. 4 Revised: 24 April 2020   How do I report side effects with PAXLOVID? Contact your healthcare provider if you have any side effects that bother you or do not go away. Report side effects to FDA MedWatch at SmoothHits.hu or call 1-800-FDA1088 or you can report side effects to [employer]. at the contact information provided below. Website Fax number Telephone number www.pfizersafetyreporting.com [phone] How should I store [place]? Store PAXLOVID tablets at room temperature, between 68?F to 77?F (20?C to 25?C). How can I learn more about COVID-19? ? Ask your healthcare provider. ? Visit https://jacobson-johnson.com/. ? Contact your local or state public health department. What is an Emergency Use Authorization (EUA)? The [place] FDA has made PAXLOVID available under an emergency access mechanism called an Emergency Use Authorization (EUA). The EUA is supported by a Education officer, museum and Human Service (HHS) declaration that circumstances exist to justify the emergency use of drugs and biological products during the COVID-19 pandemic. PAXLOVID for the treatment of mild-to-moderate COVID-19 in adults and children [37 years of age and older weighing  at least 30 pounds (49 kg)] with positive results of direct SARS-CoV-2 viral testing, and who are at high risk for progression to severe COVID-19, including hospitalization or death, has not undergone the same type of review as an FDA-approved product. In issuing an EUA under the OILNZ-97 public health emergency, the FDA has determined, among other things, that based on the total amount of scientific evidence available including data from adequate and well-controlled clinical trials, if available, it is reasonable to believe that the product may be effective for diagnosing, treating, or preventing COVID-19, or a serious or life-threatening disease or condition caused by COVID-19; that the known and potential benefits of the product, when used to diagnose, treat, or prevent such disease or condition, outweigh the known and potential risks of such product; and that there are no adequate, approved, and available alternatives. All of these criteria must be met to allow for the product to be used in the treatment  of patients during the COVID-19 pandemic. The EUA for PAXLOVID is in effect for the duration of the COVID-19 declaration justifying emergency use of this product, unless terminated or revoked (after which the products may no longer be used under the EUA). 5 Revised: 24 April 2020     Additional Information For general questions, visit the website or call the telephone number provided below. Website Telephone number www.COVID19oralRx.com [phone] (1-877-C19-PACK) You can also go to www.pfizermedinfo.com or call [phone] for more information. [id number] Revised: 24 April 2020

## 2021-03-05 ENCOUNTER — Encounter (HOSPITAL_COMMUNITY): Payer: Self-pay

## 2021-03-05 ENCOUNTER — Ambulatory Visit (HOSPITAL_COMMUNITY)
Admission: EM | Admit: 2021-03-05 | Discharge: 2021-03-05 | Disposition: A | Discharge status: Home / Self Care | Payer: Medicare Other | Attending: Physician Assistant | Admitting: Physician Assistant

## 2021-03-05 ENCOUNTER — Other Ambulatory Visit: Payer: Self-pay

## 2021-03-05 DIAGNOSIS — S61216A Laceration without foreign body of right little finger without damage to nail, initial encounter: Secondary | ICD-10-CM | POA: Diagnosis not present

## 2021-03-05 MED ORDER — LIDOCAINE HCL (PF) 1 % IJ SOLN
INTRAMUSCULAR | Status: AC
  Filled 2021-03-05: qty 30

## 2021-03-05 MED ORDER — LIDOCAINE HCL (PF) 1 % IJ SOLN
INTRAMUSCULAR | Status: AC
  Filled 2021-03-05: qty 2

## 2021-03-05 NOTE — ED Provider Notes (Signed)
[facility]    CSN: [id number] Arrival date & time: 03/05/21  1059      History   Chief Complaint Chief Complaint  Patient presents with   Laceration    Rt     HPI [name] is a 81 y.o. male.   Pt reports he cut finger on a metal piece at [employer].  Pt is on asa.  Pt reports area continues to bleed.    The history is provided by the patient. No language interpreter was used.  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 - occas slight headaches since the surgery   Colonic mass 2001   Fatigue    x 1 week o as of 02-17-2020   Fibromyalgia 1990's    Frequent PVCs    Generalized body aches    x 1 week as of 02-17-2020   H/O inguinal hernia    right side   Hernia, umbilical    History of kidney stones    HX OF MULTIPLE KIDNEY STONES AND  CURRENTLY HAS BILATERAL RENAL STONES CAUSING ABDOMINAL AND BACK PAIN   Hypertension    high blood pressure readings    NSVT (nonsustained ventricular tachycardia)    Premature atrial contractions    Prolonged Q-T interval on ECG    hx of none currently   Wears dentures    full dentures    Patient Active Problem List   Diagnosis Date Noted   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   Tinnitus, 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   Frequent PVCs    NSVT (nonsustained ventricular tachycardia)    Premature atrial contractions    Systolic dysfunction    Essential hypertension 02/03/2015   Ectopic beat, ventricular 02/03/2015    Past Surgical History:  Procedure Laterality Date   BRAIN TUMOR  EXCISION  age 69   CARDIAC CATHETERIZATION N/A 04/09/2015   Procedure: Left Heart Cath and Coronary Angiography;  Surgeon: Burnell Blanks, MD;  Location: [facility];  Service: Cardiovascular;  Laterality: N/A;   COLON SURGERY  2001   COLON RESECTION FOR GROWTH - NOT CANCER   CYSTOSCOPY WITH RETROGRADE PYELOGRAM, URETEROSCOPY AND STENT PLACEMENT Left 10/05/2012   Procedure: CYSTOSCOPY WITH RETROGRADE PYELOGRAM, URETEROSCOPY AND STENT PLACEMENT;  Surgeon: Alexis Frock, MD;  Location: [facility];  Service: Urology;  Laterality: Left;   CYSTOSCOPY/RETROGRADE/URETEROSCOPY/STONE EXTRACTION WITH BASKET Right 10/05/2012   Procedure: CYSTOSCOPY/RETROGRADE/URETEROSCOPY/STONE EXTRACTION WITH BASKET;  Surgeon: Alexis Frock, MD;  Location: [facility];  Service: Urology;  Laterality: Right;   CYSTOSCOPY/RETROGRADE/URETEROSCOPY/STONE EXTRACTION WITH BASKET Bilateral 10/24/2012   Procedure: CYSTOSCOPY/RETROGRADE/URETEROSCOPY/STONE EXTRACTION WITH BASKET;  Surgeon: Alexis Frock, MD;  Location: [facility];  Service: Urology;  Laterality: Bilateral;  with bilateral stent placement   HOLMIUM LASER APPLICATION Right 5/78/4696   Procedure: HOLMIUM LASER APPLICATION;  Surgeon: Alexis Frock, MD;  Location: [facility];  Service: Urology;  Laterality: Right;   HOLMIUM LASER APPLICATION Bilateral 2/95/2841   Procedure: HOLMIUM LASER APPLICATION;  Surgeon: Alexis Frock, MD;  Location: [facility];  Service: Urology;  Laterality: Bilateral;   LITHOTRIPSY     TONSILLECTOMY  age 75's   and adenoids       Home Medications    Prior to Admission medications   Medication Sig Start Date End Date Taking? Authorizing Provider  aspirin EC 81 MG tablet Take 1 tablet (81 mg total) by mouth daily. 04/02/15   Dorothy Spark, MD  benzonatate (TESSALON PERLES) 100 MG capsule 1-2 capsules twice daily as needed for cough Patient not taking: Reported on 03/05/2021 11/26/20   Lucretia Kern, DO  carvedilol (COREG) 3.125 MG tablet  TAKE 1 TABLET BY MOUTH 2 TIMES DAILY. 09/08/20   Nafziger, Tommi Rumps, NP  Cranberry 1000 MG CAPS Take 1,000 mg by mouth daily.     [provider]  ibuprofen (ADVIL,MOTRIN) 200 MG tablet Take 600 mg by mouth 2 (two) times daily.     [provider]  lisinopril (ZESTRIL) 10 MG tablet TAKE 1 TABLET BY MOUTH EVERY DAY 09/08/20   Nafziger, Tommi Rumps, NP  Misc Natural Products (OSTEO BI-FLEX ADV JOINT SHIELD PO) Take 2 capsules by mouth daily.    [provider]  Multiple Vitamin (MULTIVITAMIN) tablet Take 1 tablet by mouth daily.    [provider]  ondansetron (ZOFRAN ODT) 4 MG disintegrating tablet Take 1 tablet (4 mg total) by mouth every 8 (eight) hours as needed for nausea or vomiting. Patient not taking: Reported on 03/05/2021 02/19/20   Dorothyann Peng, NP    Family History Family History  Problem Relation Age of Onset   Colon cancer Father    Heart disease Father        quad bypass    Social History Social History   Tobacco Use   Smoking status: Never   Smokeless tobacco: Never  Vaping Use   Vaping Use: Never used  Substance Use Topics   Alcohol use: Yes    Alcohol/week: 7.0 standard drinks    Types: 7 Standard drinks or equivalent per week    Comment: 1 drink per day   Drug use: Not Currently     Allergies   Other, Prozac [fluoxetine hcl], Sulfa antibiotics, Vicodin [hydrocodone-acetaminophen], and Erythromycin   Review of Systems Review of Systems  All other systems reviewed and are negative.   Physical Exam Triage Vital Signs ED Triage Vitals  Enc Vitals Group     BP 03/05/21 1120 (!) 179/97     Pulse Rate 03/05/21 1111 60     Resp 03/05/21 1111 14     Temp 03/05/21 1111 97.9 F (36.6 C)     Temp Source 03/05/21 1111 Oral     SpO2 03/05/21 1111 97 %     Weight --      Height --      Head Circumference --      Peak Flow --      Pain Score 03/05/21 1118 1     Pain Loc --      Pain Edu? --      Excl. in [place]? --    No data  found.  Updated Vital Signs BP (!) 179/97    Pulse 60    Temp 97.9 F (36.6 C) (Oral)    Resp 14    SpO2 97%   Visual Acuity Right Eye Distance:   Left Eye Distance:   Bilateral Distance:    Right Eye Near:   Left Eye Near:    Bilateral Near:     Physical Exam Vitals reviewed.  Constitutional:      Appearance: Normal appearance.  HENT:     Head: Normocephalic.  Musculoskeletal:        General: Normal range of motion.     Comments: 68mm laceration dorsal aspect of left ring finger,  normal range of motion,  nv and ns intact  Skin:    General: Skin is warm.  Neurological:     General: No focal deficit present.     Mental Status: He is alert.  Psychiatric:        Mood and Affect: Mood normal.     UC Treatments / Results  Labs (all labs ordered are listed, but only abnormal results are displayed) Labs Reviewed - No data to display  EKG   Radiology No results found.  Procedures Laceration Repair  Date/Time: 03/05/2021 12:27 PM Performed by: Fransico Meadow, PA-C Authorized by: Fransico Meadow, PA-C   Consent:    Consent obtained:  Verbal   Consent given by:  Patient   Risks, benefits, and alternatives were discussed: yes     Risks discussed:  Infection Universal protocol:    Procedure explained and questions answered to patient or proxy's satisfaction: yes     Patient identity confirmed:  Verbally with patient Anesthesia:    Anesthesia method:  Nerve block   Block location:  Digital   Block needle gauge:  27 G   Block anesthetic:  Lidocaine 1% w/o epi   Block injection procedure:  Anatomic landmarks identified and introduced needle   Block outcome:  Anesthesia achieved Laceration details:    Location:  Finger   Finger location:  R small finger   Length (cm):  0.7 Exploration:    Wound exploration: wound explored through full range of motion and entire depth of wound visualized   Treatment:    Area cleansed with:  Povidone-iodine   Irrigation  solution:  Sterile saline   Visualized foreign bodies/material removed: no     Debridement:  None   Undermining:  None Skin repair:    Repair method:  Sutures   Suture size:  5-0   Suture material:  Nylon   Suture technique:  Simple interrupted   Number of sutures:  2 Approximation:    Approximation:  Close Repair type:    Repair type:  Simple Post-procedure details:    Procedure completion:  Tolerated (including critical care time)  Medications Ordered in UC Medications - No data to display  Initial Impression / Assessment and Plan / UC Course  I have reviewed the triage vital signs and the nursing notes.  Pertinent labs & imaging results that were available during my care of the patient were reviewed by me and considered in my medical decision making (see chart for details).      Final Clinical Impressions(s) / UC Diagnoses   Final diagnoses:  Laceration of right little finger, foreign body presence unspecified, nail damage status unspecified, initial encounter     Discharge Instructions      Return in 8 days for suture removal   ED Prescriptions   None    PDMP not reviewed this encounter.   Fransico Meadow, [place] 03/05/21 1230

## 2021-03-05 NOTE — Discharge Instructions (Signed)
Return in 8 days for suture removal °

## 2021-03-05 NOTE — ED Triage Notes (Signed)
Pt presents with rt pinky laceration that occurred several hours ago. Pt states he can not get bleeding to subside. Pt denies taking blood thinners. Lat tdap 3 years ago.

## 2021-03-06 ENCOUNTER — Other Ambulatory Visit: Payer: Self-pay | Admitting: Adult Health

## 2021-03-06 DIAGNOSIS — I10 Essential (primary) hypertension: Secondary | ICD-10-CM

## 2021-03-09 ENCOUNTER — Other Ambulatory Visit: Payer: Self-pay | Admitting: Adult Health

## 2021-03-09 DIAGNOSIS — I10 Essential (primary) hypertension: Secondary | ICD-10-CM

## 2021-03-09 NOTE — Telephone Encounter (Signed)
Patient need to schedule an ov for more refills. 

## 2021-03-13 ENCOUNTER — Other Ambulatory Visit: Payer: Self-pay

## 2021-03-13 ENCOUNTER — Ambulatory Visit (HOSPITAL_COMMUNITY)
Admission: EM | Admit: 2021-03-13 | Discharge: 2021-03-13 | Disposition: A | Discharge status: Home / Self Care | Payer: Medicare Other

## 2021-03-13 DIAGNOSIS — Z4802 Encounter for removal of sutures: Secondary | ICD-10-CM

## 2021-03-13 NOTE — ED Triage Notes (Signed)
Pt presents to UC with c/o suture removal.

## 2021-05-06 ENCOUNTER — Ambulatory Visit (INDEPENDENT_AMBULATORY_CARE_PROVIDER_SITE_OTHER): Payer: Medicare Other

## 2021-05-06 VITALS — Ht 72.0 in | Wt 170.0 lb

## 2021-05-06 DIAGNOSIS — Z0000 Encounter for general adult medical examination without abnormal findings: Secondary | ICD-10-CM

## 2021-05-06 NOTE — Progress Notes (Signed)
? ?Subjective:  ? [name] is a 81 y.o. male who presents for Medicare Annual/Subsequent preventive examination. ? ?Review of Systems    ?Virtual Visit via Telephone Note ? ?I connected with  [name] on 05/06/21 at 12:30 PM EDT by telephone and verified that I am speaking with the correct person using two identifiers. ? ?Location: ?Patient: Home ?Provider: Office ?Persons participating in the virtual visit: patient/Nurse Health Advisor ?  ?I discussed the limitations, risks, security and privacy concerns of performing an evaluation and management service by telephone and the availability of in person appointments. The patient expressed understanding and agreed to proceed. ? ?Interactive audio and video telecommunications were attempted between this nurse and patient, however failed, due to patient having technical difficulties OR patient did not have access to video capability.  We continued and completed visit with audio only. ? ?Some vital signs may be absent or patient reported.  ? ?Criselda Peaches, LPN  ?Cardiac Risk Factors include: advanced age (>52mn, >>5women);hypertension;male gender ? ?   ?Objective:  ?  ?Today's Vitals  ? 05/06/21 1233  ?Weight: 170 lb (77.1 kg)  ?Height: 6' (1.829 m)  ? ?Body mass index is 23.06 kg/m?. ? ? ?  05/06/2021  ? 12:41 PM 05/04/2020  ?  9:52 AM 09/30/2019  ?  6:51 AM 09/14/2019  ? 12:29 PM 08/10/2019  ?  1:20 PM 02/15/2018  ?  7:54 PM 09/27/2017  ?  9:27 AM  ?Advanced Directives  ?Does Patient Have a Medical Advance Directive? No Yes No No No No No  ?Does patient want to make changes to medical advance directive? No - Patient declined No - Patient declined       ?Would patient like information on creating a medical advance directive? No - Patient declined  No - Patient declined      ? ? ?Current Medications (verified) ?Outpatient Encounter Medications as of 05/06/2021  ?Medication Sig  ? aspirin EC 81 MG tablet Take 1 tablet (81 mg total) by mouth daily.  ?  benzonatate (TESSALON PERLES) 100 MG capsule 1-2 capsules twice daily as needed for cough (Patient not taking: Reported on 03/05/2021)  ? carvedilol (COREG) 3.125 MG tablet TAKE 1 TABLET BY MOUTH TWICE A DAY  ? Cranberry 1000 MG CAPS Take 1,000 mg by mouth daily.   ? ibuprofen (ADVIL,MOTRIN) 200 MG tablet Take 600 mg by mouth 2 (two) times daily.   ? lisinopril (ZESTRIL) 10 MG tablet TAKE 1 TABLET BY MOUTH EVERY DAY  ? Misc Natural Products (OSTEO BI-FLEX ADV JOINT SHIELD PO) Take 2 capsules by mouth daily.  ? Multiple Vitamin (MULTIVITAMIN) tablet Take 1 tablet by mouth daily.  ? ondansetron (ZOFRAN ODT) 4 MG disintegrating tablet Take 1 tablet (4 mg total) by mouth every 8 (eight) hours as needed for nausea or vomiting. (Patient not taking: Reported on 03/05/2021)  ? ?No facility-administered encounter medications on file as of 05/06/2021.  ? ? ?Allergies (verified) ?Other, Prozac [fluoxetine hcl], Sulfa antibiotics, Vicodin [hydrocodone-acetaminophen], and Erythromycin  ? ?History: ?Past Medical History:  ?Diagnosis Date  ? Arthritis   ? HANDS AND PT STATES HIS ARMS HURT  ? [place]   [place] 89- brain tumor removed left- benign - occas slight headaches since the surgery  ? Colonic mass 2001  ? Fatigue   ? x 1 week o as of 02-17-2020  ? Fibromyalgia 1990's   ? Frequent PVCs   ? Generalized body aches   ?  x 1 week as of 02-17-2020  ? H/O inguinal hernia   ? right side  ? Hernia, umbilical   ? History of kidney stones   ? HX OF MULTIPLE KIDNEY STONES AND  CURRENTLY HAS BILATERAL RENAL STONES CAUSING ABDOMINAL AND BACK PAIN  ? Hypertension   ? high blood pressure readings   ? NSVT (nonsustained ventricular tachycardia)   ? Premature atrial contractions   ? Prolonged Q-T interval on ECG   ? hx of none currently  ? Wears dentures   ? full dentures  ? ?Past Surgical History:  ?Procedure Laterality Date  ? BRAIN TUMOR EXCISION  age 72  ? CARDIAC CATHETERIZATION N/A 04/09/2015  ? Procedure: Left Heart Cath and Coronary  Angiography;  Surgeon: Burnell Blanks, MD;  Location: [facility];  Service: Cardiovascular;  Laterality: N/A;  ? COLON SURGERY  2001  ? COLON RESECTION FOR GROWTH - NOT CANCER  ? CYSTOSCOPY WITH RETROGRADE PYELOGRAM, URETEROSCOPY AND STENT PLACEMENT Left 10/05/2012  ? Procedure: CYSTOSCOPY WITH RETROGRADE PYELOGRAM, URETEROSCOPY AND STENT PLACEMENT;  Surgeon: Alexis Frock, MD;  [facility];  Service: Urology;  Laterality: Left;  ? CYSTOSCOPY/RETROGRADE/URETEROSCOPY/STONE EXTRACTION WITH BASKET Right 10/05/2012  ? Procedure: CYSTOSCOPY/RETROGRADE/URETEROSCOPY/STONE EXTRACTION WITH BASKET;  Surgeon: Alexis Frock, MD;  [facility];  Service: Urology;  Laterality: Right;  ? CYSTOSCOPY/RETROGRADE/URETEROSCOPY/STONE EXTRACTION WITH BASKET Bilateral 10/24/2012  ? Procedure: CYSTOSCOPY/RETROGRADE/URETEROSCOPY/STONE EXTRACTION WITH BASKET;  Surgeon: Alexis Frock, MD;  [facility];  Service: Urology;  Laterality: Bilateral;  with bilateral stent placement  ? HOLMIUM LASER APPLICATION Right 0/27/2536  ? Procedure: HOLMIUM LASER APPLICATION;  Surgeon: Alexis Frock, MD;  Location: [facility];  Service: Urology;  Laterality: Right;  ? HOLMIUM LASER APPLICATION Bilateral 6/44/0347  ? Procedure: HOLMIUM LASER APPLICATION;  Surgeon: Alexis Frock, MD;  Location[facility];  Service: Urology;  Laterality: Bilateral;  ? LITHOTRIPSY    ? TONSILLECTOMY  age 75's  ? and adenoids  ? ?Family History  ?Problem Relation Age of Onset  ? Colon cancer Father   ? Heart disease Father   ?     quad bypass  ? ?Social History  ? ?Socioeconomic History  ? Marital status: Single  ?  Spouse name: Not on file  ? Number of children: Not on file  ? Years of education: Not on file  ? Highest education level: Not on file  ?Occupational History  ? Not on file  ?Tobacco Use  ? Smoking status: Never  ? Smokeless tobacco: Never  ?Vaping Use  ? Vaping Use: Never used  ?Substance and Sexual Activity  ? Alcohol use: Yes  ?   Alcohol/week: 7.0 standard drinks  ?  Types: 7 Standard drinks or equivalent per week  ?  Comment: 1 drink per day  ? Drug use: Not Currently  ? Sexual activity: Not on file  ?Other Topics Concern  ? Not on file  ?Social History Narrative  ? 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 Has one son - lives local   ? Likes to play Tennis  ? ?Social Determinants of Health  ? ?Financial Resource Strain: Low Risk   ? Difficulty of Paying Living Expenses: Not hard at all  ?Food Insecurity: No Food Insecurity  ? Worried About Charity fundraiser in the Last Year: Never true  ? Ran Out of Food in the Last Year: Never true  ?Transportation Needs: No Transportation Needs  ? Lack of Transportation (Medical): No  ? Lack of Transportation (Non-Medical): No  ?  Physical Activity: Insufficiently Active  ? Days of Exercise per Week: 1 day  ? Minutes of Exercise per Session: 90 min  ?Stress: No Stress Concern Present  ? Feeling of Stress : Not at all  ?Social Connections: Moderately Isolated  ? Frequency of Communication with Friends and Family: More than three times a week  ? Frequency of Social Gatherings with Friends and Family: More than three times a week  ? Attends Religious Services: Never  ? Active Member of Clubs or Organizations: Yes  ? Attends Archivist Meetings: More than 4 times per year  ? Marital Status: Never married  ? ? ? ?Clinical Intake: ?How often do you need to have someone help you when you read instructions, pamphlets, or other written materials from your doctor or pharmacy?: 1 - Never ? ?Diabetic?   No ? ? ?Activities of Daily Living ? ?  05/06/2021  ? 12:40 PM  ?In your present state of health, do you have any difficulty performing the following activities:  ?Hearing? 0  ?Vision? 0  ?Difficulty concentrating or making decisions? 0  ?Walking or climbing stairs? 0  ?Dressing or bathing? 0  ?Doing errands, shopping? 0  ?Preparing Food and eating ? N  ?Using the Toilet? N  ?In the past six months, have you accidently  leaked urine? N  ?Do you have problems with loss of bowel control? N  ?Managing your Medications? N  ?Managing your Finances? N  ?Housekeeping or managing your Housekeeping? N  ? ? ?Patient Care Team: ?Dorothyann Peng,

## 2021-05-06 NOTE — Patient Instructions (Addendum)
?Mr. Groninger , ?Thank you for taking time to come for your Medicare Wellness Visit. I appreciate your ongoing commitment to your health goals. Please review the following plan we discussed and let me know if I can assist you in the future.  ? ?These are the goals we discussed: ? Goals   ? ?   Exercise 3x per week (30 min per time) (pt-stated)   ?   Continue to Play tennis 3 times a week.  ?  ?   Patient Stated   ?   Keep playing tennis  ?  ? ?  ?  ?This is a list of the screening recommended for you and due dates:  ?Health Maintenance  ?Topic Date Due  ? Flu Shot  05/07/2021*  ? COVID-19 Vaccine (2 - Moderna series) 05/22/2021*  ? Zoster (Shingles) Vaccine (1 of 2) 08/06/2021*  ? Tetanus Vaccine  05/07/2022*  ? Hepatitis C Screening: USPSTF Recommendation to screen - Ages 18-80 yo.  05/07/2022*  ? Pneumonia Vaccine  Completed  ? HPV Vaccine  Aged Out  ?*Topic was postponed. The date shown is not the original due date.  ? ?Advanced directives: No Patient declined ? ?Conditions/risks identified: None ? ?Next appointment: Follow up in one year for your annual wellness visit.  ? ?Preventive Care 49 Years and Older, Male ?Preventive care refers to lifestyle choices and visits with your health care provider that can promote health and wellness. ?What does preventive care include? ?A yearly physical exam. This is also called an annual well check. ?Dental exams once or twice a year. ?Routine eye exams. Ask your health care provider how often you should have your eyes checked. ?Personal lifestyle choices, including: ?Daily care of your teeth and gums. ?Regular physical activity. ?Eating a healthy diet. ?Avoiding tobacco and drug use. ?Limiting alcohol use. ?Practicing safe sex. ?Taking low doses of aspirin every day. ?Taking vitamin and mineral supplements as recommended by your health care provider. ?What happens during an annual well check? ?The services and screenings done by your health care provider during your annual  well check will depend on your age, overall health, lifestyle risk factors, and family history of disease. ?Counseling  ?Your health care provider may ask you questions about your: ?Alcohol use. ?Tobacco use. ?Drug use. ?Emotional well-being. ?Home and relationship well-being. ?Sexual activity. ?Eating habits. ?History of falls. ?Memory and ability to understand (cognition). ?Work and work Statistician. ?Screening  ?You may have the following tests or measurements: ?Height, weight, and BMI. ?Blood pressure. ?Lipid and cholesterol levels. These may be checked every 5 years, or more frequently if you are over 48 years old. ?Skin check. ?Lung cancer screening. You may have this screening every year starting at age 55 if you have a 30-pack-year history of smoking and currently smoke or have quit within the past 15 years. ?Fecal occult blood test (FOBT) of the stool. You may have this test every year starting at age 28. ?Flexible sigmoidoscopy or colonoscopy. You may have a sigmoidoscopy every 5 years or a colonoscopy every 10 years starting at age 42. ?Prostate cancer screening. Recommendations will vary depending on your family history and other risks. ?Hepatitis C blood test. ?Hepatitis B blood test. ?Sexually transmitted disease (STD) testing. ?Diabetes screening. This is done by checking your blood sugar (glucose) after you have not eaten for a while (fasting). You may have this done every 1-3 years. ?Abdominal aortic aneurysm (AAA) screening. You may need this if you are a current or former smoker. ?  Osteoporosis. You may be screened starting at age 20 if you are at high risk. ?Talk with your health care provider about your test results, treatment options, and if necessary, the need for more tests. ?Vaccines  ?Your health care provider may recommend certain vaccines, such as: ?Influenza vaccine. This is recommended every year. ?Tetanus, diphtheria, and acellular pertussis (Tdap, Td) vaccine. You may need a Td booster  every 10 years. ?Zoster vaccine. You may need this after age 64. ?Pneumococcal 13-valent conjugate (PCV13) vaccine. One dose is recommended after age 56. ?Pneumococcal polysaccharide (PPSV23) vaccine. One dose is recommended after age 25. ?Talk to your health care provider about which screenings and vaccines you need and how often you need them. ?This information is not intended to replace advice given to you by your health care provider. Make sure you discuss any questions you have with your health care provider. ?Document Released: 02/20/2015 Document Revised: 10/14/2015 Document Reviewed: 11/25/2014 ?Elsevier Interactive Patient Education ? 2017 [place]. ? ?Fall Prevention in the Home ?Falls can cause injuries. They can happen to people of all ages. There are many things you can do to make your home safe and to help prevent falls. ?What can I do on the outside of my home? ?Regularly fix the edges of walkways and driveways and fix any cracks. ?Remove anything that might make you trip as you walk through a door, such as a raised step or threshold. ?Trim any bushes or trees on the path to your home. ?Use bright outdoor lighting. ?Clear any walking paths of anything that might make someone trip, such as rocks or tools. ?Regularly check to see if handrails are loose or broken. Make sure that both sides of any steps have handrails. ?Any raised decks and porches should have guardrails on the edges. ?Have any leaves, snow, or ice cleared regularly. ?Use sand or salt on walking paths during winter. ?Clean up any spills in your garage right away. This includes oil or grease spills. ?What can I do in the bathroom? ?Use night lights. ?Install grab bars by the toilet and in the tub and shower. Do not use towel bars as grab bars. ?Use non-skid mats or decals in the tub or shower. ?If you need to sit down in the shower, use a plastic, non-slip stool. ?Keep the floor dry. Clean up any water that spills on the floor as soon  as it happens. ?Remove soap buildup in the tub or shower regularly. ?Attach bath mats securely with double-sided non-slip rug tape. ?Do not have throw rugs and other things on the floor that can make you trip. ?What can I do in the bedroom? ?Use night lights. ?Make sure that you have a light by your bed that is easy to reach. ?Do not use any sheets or blankets that are too big for your bed. They should not hang down onto the floor. ?Have a firm chair that has side arms. You can use this for support while you get dressed. ?Do not have throw rugs and other things on the floor that can make you trip. ?What can I do in the kitchen? ?Clean up any spills right away. ?Avoid walking on wet floors. ?Keep items that you use a lot in easy-to-reach places. ?If you need to reach something above you, use a strong step stool that has a grab bar. ?Keep electrical cords out of the way. ?Do not use floor polish or wax that makes floors slippery. If you must use wax, use non-skid floor  wax. ?Do not have throw rugs and other things on the floor that can make you trip. ?What can I do with my stairs? ?Do not leave any items on the stairs. ?Make sure that there are handrails on both sides of the stairs and use them. Fix handrails that are broken or loose. Make sure that handrails are as long as the stairways. ?Check any carpeting to make sure that it is firmly attached to the stairs. Fix any carpet that is loose or worn. ?Avoid having throw rugs at the top or bottom of the stairs. If you do have throw rugs, attach them to the floor with carpet tape. ?Make sure that you have a light switch at the top of the stairs and the bottom of the stairs. If you do not have them, ask someone to add them for you. ?What else can I do to help prevent falls? ?Wear shoes that: ?Do not have high heels. ?Have rubber bottoms. ?Are comfortable and fit you well. ?Are closed at the toe. Do not wear sandals. ?If you use a stepladder: ?Make sure that it is fully  opened. Do not climb a closed stepladder. ?Make sure that both sides of the stepladder are locked into place. ?Ask someone to hold it for you, if possible. ?Clearly mark and make sure that you can see

## 2021-05-19 ENCOUNTER — Ambulatory Visit (HOSPITAL_COMMUNITY)
Admission: EM | Admit: 2021-05-19 | Discharge: 2021-05-19 | Disposition: A | Discharge status: Home / Self Care | Payer: Medicare Other | Attending: Family Medicine | Admitting: Family Medicine

## 2021-05-19 ENCOUNTER — Encounter (HOSPITAL_COMMUNITY): Payer: Self-pay

## 2021-05-19 DIAGNOSIS — L97521 Non-pressure chronic ulcer of other part of left foot limited to breakdown of skin: Secondary | ICD-10-CM

## 2021-05-19 MED ORDER — DOXYCYCLINE HYCLATE 100 MG PO CAPS: 100.0000 mg | ORAL_CAPSULE | Freq: Two times a day (BID) | ORAL | 0 refills | Status: AC

## 2021-05-19 NOTE — ED Provider Notes (Signed)
?[facility] ? ? ? ?CSN: [id number] ?Arrival date & time: 05/19/21  5883 ? ? ?  ? ?History   ?Chief Complaint ?No chief complaint on file. ? ? ?HPI ?[name] is a 81 y.o. male.  ? 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 ? ?Past Medical History:  ?Diagnosis Date  ? Arthritis   ? HANDS AND PT STATES HIS ARMS HURT  ? Brain tumor ([place])   ? 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 Colonic mass 2001  ? Fatigue   ? x 1 week o as of 02-17-2020  ? Fibromyalgia 1990's   ? Frequent PVCs   ? Generalized body aches   ? x 1 week as of 02-17-2020  ? H/O inguinal hernia   ? right side  ? Hernia, umbilical   ? History of kidney stones   ? HX OF MULTIPLE KIDNEY STONES AND  CURRENTLY HAS BILATERAL RENAL STONES CAUSING ABDOMINAL AND BACK PAIN  ? Hypertension   ? high blood pressure readings   ? NSVT (nonsustained ventricular tachycardia) ([place])   ? Premature atrial contractions   ? Prolonged Q-T interval on ECG   ? hx of none currently  ? Wears dentures   ? full dentures  ? ? ?Patient Active Problem List  ? Diagnosis Date Noted  ? 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 Frequent PVCs   ? NSVT (nonsustained ventricular tachycardia) ([place])   ? Premature atrial contractions   ? Systolic dysfunction   ? Essential hypertension 02/03/2015  ? Ectopic beat, ventricular 02/03/2015  ? ? ?Past Surgical History:  ?Procedure Laterality Date  ? BRAIN TUMOR EXCISION  age 78  ? CARDIAC CATHETERIZATION N/A 04/09/2015  ? Procedure: Left Heart Cath and Coronary Angiography;  Surgeon: Burnell Blanks, MD;  Location: [facility];  Service:  Cardiovascular;  Laterality: N/A;  ? COLON SURGERY  2001  ? COLON RESECTION FOR GROWTH - NOT CANCER  ? CYSTOSCOPY WITH RETROGRADE PYELOGRAM, URETEROSCOPY AND STENT PLACEMENT Left 10/05/2012  ? Procedure: CYSTOSCOPY WITH RETROGRADE PYELOGRAM, URETEROSCOPY AND STENT PLACEMENT;  Surgeon: Alexis Frock, MD;  Location: [facility];  Service: Urology;  Laterality: Left;  ? CYSTOSCOPY/RETROGRADE/URETEROSCOPY/STONE EXTRACTION WITH BASKET Right 10/05/2012  ? Procedure: CYSTOSCOPY/RETROGRADE/URETEROSCOPY/STONE EXTRACTION WITH BASKET;  Surgeon: Alexis Frock, MD;  Location: [facility];  Service: Urology;  Laterality: Right;  ? CYSTOSCOPY/RETROGRADE/URETEROSCOPY/STONE EXTRACTION WITH BASKET Bilateral 10/24/2012  ? Procedure: CYSTOSCOPY/RETROGRADE/URETEROSCOPY/STONE EXTRACTION WITH BASKET;  Surgeon: Alexis Frock, MD;  Location: [facility];  Service: Urology;  Laterality: Bilateral;  with bilateral stent placement  ? HOLMIUM LASER APPLICATION Right 2/54/9826  ? Procedure: HOLMIUM LASER APPLICATION;  Surgeon: Alexis Frock, MD;  Location: [facility];  Service: Urology;  Laterality: Right;  ? HOLMIUM LASER APPLICATION Bilateral 05/23/8307  ? Procedure: HOLMIUM LASER APPLICATION;  Surgeon: Alexis Frock, MD;  Location: [facility];  Service: Urology;  Laterality: Bilateral;  ? LITHOTRIPSY    ? TONSILLECTOMY  age 26's  ? and adenoids  ? ? ? ? ? ?Home Medications   ? ?Prior to Admission medications   ?  Medication Sig Start Date End Date Taking? Authorizing Provider  ?doxycycline (VIBRAMYCIN) 100 MG capsule Take 1 capsule (100 mg total) by mouth 2 (two) times daily for 5 days. 05/19/21 05/24/21 Yes Eldwin Volkov, Bernita Raisin, DO  ?aspirin EC 81 MG tablet Take 1 tablet (81 mg total) by mouth daily. 04/02/15   Dorothy Spark, MD  ?benzonatate (TESSALON PERLES) 100 MG capsule 1-2 capsules twice daily as needed for cough ?Patient not taking: Reported on 03/05/2021 11/26/20   Colin Benton R, DO  ?carvedilol (COREG) 3.125 MG tablet TAKE 1 TABLET BY MOUTH TWICE A DAY  03/09/21   Dorothyann Peng, NP  ?Cranberry 1000 MG CAPS Take 1,000 mg by mouth daily.     [provider]  ?ibuprofen (ADVIL,MOTRIN) 200 MG tablet Take 600 mg by mouth 2 (two) times daily.     [provider]  ?lisinopril (ZESTRIL) 10 MG tablet TAKE 1 TABLET BY MOUTH EVERY DAY 03/09/21   Dorothyann Peng, NP  ?Misc Natural Products (OSTEO BI-FLEX ADV JOINT SHIELD PO) Take 2 capsules by mouth daily.    [provider]  ?Multiple Vitamin (MULTIVITAMIN) tablet Take 1 tablet by mouth daily.    [provider]  ?ondansetron (ZOFRAN ODT) 4 MG disintegrating tablet Take 1 tablet (4 mg total) by mouth every 8 (eight) hours as needed for nausea or vomiting. ?Patient not taking: Reported on 03/05/2021 02/19/20   Dorothyann Peng, NP  ? ? ?Family History ?Family History  ?Problem Relation Age of Onset  ? Colon cancer Father   ? Heart disease Father   ?     quad bypass  ? ? ?Social History ?Social History  ? ?Tobacco Use  ? Smoking status: Never  ? Smokeless tobacco: Never  ?Vaping Use  ? Vaping Use: Never used  ?Substance Use Topics  ? Alcohol use: Yes  ?  Alcohol/week: 7.0 standard drinks  ?  Types: 7 Standard drinks or equivalent per week  ?  Comment: 1 drink per day  ? Drug use: Not Currently  ? ? ? ?Allergies   ?Other, Prozac [fluoxetine hcl], Sulfa antibiotics, Vicodin [hydrocodone-acetaminophen], and Erythromycin ? ? ?Review of Systems ?Review of Systems  ?All other systems reviewed and are negative. ? ?Per HPI ?Physical Exam ?Triage Vital Signs ?ED Triage Vitals  ?Enc Vitals Group  ?   BP 05/19/21 1022 (!) 190/89  ?   Pulse Rate 05/19/21 1021 (!) 50  ?   Resp 05/19/21 1021 16  ?   Temp 05/19/21 1021 97.9 ?F (36.6 ?C)  ?   Temp Source 05/19/21 1021 Oral  ?   SpO2 05/19/21 1021 95 %  ?   Weight --   ?   Height --   ?   Head Circumference --   ?   Peak Flow --   ?   Pain Score --   ?   Pain Loc --   ?   Pain Edu? --   ?   Excl. in [place]? --   ? ?No data found. ? ?Updated Vital Signs ?BP (!) 190/89    Pulse (!) 50   Temp 97.9 ?F (36.6 ?C) (Oral)   Resp 16   SpO2 95%  ? ?Visual Acuity ?Right Eye Distance:   ?Left Eye Distance:   ?Bilateral Distance:   ? ?Right Eye Near:   ?Left Eye Near:    ?Bilateral Near:    ? ?Physical Exam ?Constitutional:   ?   General: He is not in acute distress. ?  Appearance: Normal appearance. He is not ill-appearing.  ?HENT:  ?   Head: Normocephalic and atraumatic.  ?Eyes:  ?   Conjunctiva/sclera: Conjunctivae normal.  ?Cardiovascular:  ?   Rate and Rhythm: Normal rate.  ?Pulmonary:  ?   Effort: Pulmonary effort is normal. No respiratory distress.  ?Musculoskeletal:  ?   Cervical back: Normal range of motion.  ?   Comments: 2nd toe hammer toe with PIP callus formation ?5th MTP laterally with ulceration of the skin and slight opening without any purulent drainage ?There is a small area just medial to this that is about 3 cm in width that is erythematous, blanching, no increased warmth to palpation ?No TTP of surrounding skin or boney structure  ?Skin: ?   General: Skin is warm and dry.  ?Neurological:  ?   Mental Status: He is alert and oriented to person, place, and time.  ?Psychiatric:     ?   Mood and Affect: Mood normal.     ?   Behavior: Behavior normal.  ? ? ? ?UC Treatments / Results  ?Labs ?(all labs ordered are listed, but only abnormal results are displayed) ?Labs Reviewed - No data to display ? ?EKG ? ? ?Radiology ?No results found. ? ?Procedures ?Procedures (including critical care time) ? ?Medications Ordered in UC ?Medications - No data to display ? ?Initial Impression / Assessment and Plan / UC Course  ?I have reviewed the triage vital signs and the nursing notes. ? ?Pertinent labs & imaging results that were available during my care of the patient were reviewed by me and considered in my medical decision making (see chart for details). ? ?  ? ?Very low suspicion for osteomyelitis.  Wound appears to be contained to the skin.  The erythematous area just medial to the  wound appears to be more of a bruise than erythema from an infection.  No systemic symptoms.  We will treat with a short course of doxycycline to cover for possible infection, but recommend follow-up with

## 2021-05-19 NOTE — Discharge Instructions (Signed)
As we discussed, keep the area clean and dry.  Right short course of antibiotics to hopefully take care of any infection that may be present.  I recommend following up with your primary care doctor so that they keep a close eye on this wound.  If it severely worsens, redness spreads, pain worsens, you develop fevers, you should be seen by medical provider right away. ?

## 2021-05-19 NOTE — ED Triage Notes (Signed)
Pt presents to office for left foot pain x 1 week.  ?

## 2021-08-30 ENCOUNTER — Encounter: Payer: Self-pay | Admitting: Family Medicine

## 2021-08-30 ENCOUNTER — Ambulatory Visit (INDEPENDENT_AMBULATORY_CARE_PROVIDER_SITE_OTHER): Payer: Medicare Other | Admitting: Family Medicine

## 2021-08-30 VITALS — BP 128/90 | HR 80 | Temp 97.7°F | Ht 72.0 in | Wt 166.7 lb

## 2021-08-30 DIAGNOSIS — R300 Dysuria: Secondary | ICD-10-CM

## 2021-08-30 DIAGNOSIS — R3912 Poor urinary stream: Secondary | ICD-10-CM

## 2021-08-30 DIAGNOSIS — R6889 Other general symptoms and signs: Secondary | ICD-10-CM

## 2021-08-30 DIAGNOSIS — I10 Essential (primary) hypertension: Secondary | ICD-10-CM

## 2021-08-30 DIAGNOSIS — N401 Enlarged prostate with lower urinary tract symptoms: Secondary | ICD-10-CM

## 2021-08-30 LAB — POC URINALSYSI DIPSTICK (AUTOMATED)
Bilirubin, UA: NEGATIVE
Blood, UA: NEGATIVE
Glucose, UA: NEGATIVE
Ketones, UA: NEGATIVE
Leukocytes, UA: NEGATIVE
Nitrite, UA: NEGATIVE
Protein, UA: NEGATIVE
Spec Grav, UA: 1.015 (ref 1.010–1.025)
Urobilinogen, UA: 0.2 E.U./dL
pH, UA: 6.5 (ref 5.0–8.0)

## 2021-08-30 MED ORDER — LISINOPRIL 10 MG PO TABS: 10.0000 mg | ORAL_TABLET | Freq: Every day | ORAL | 3 refills | Status: DC

## 2021-08-30 MED ORDER — TAMSULOSIN HCL 0.4 MG PO CAPS: 0.4000 mg | ORAL_CAPSULE | Freq: Every day | ORAL | 3 refills | Status: DC

## 2021-08-30 MED ORDER — ONDANSETRON 4 MG PO TBDP: 4.0000 mg | ORAL_TABLET | Freq: Three times a day (TID) | ORAL | 0 refills | Status: DC | PRN

## 2021-08-30 NOTE — Progress Notes (Signed)
Established Patient Office Visit  Subjective   Patient ID: [name], male    DOB: 30-Nov-1940  Age: 81 y.o. MRN: [id number]  Chief Complaint  Patient presents with   Urinary Tract Infection   Dysuria    Patient complains of dysuria, x1 week   Fatigue    Patient complains of fatigue, x1 week    Nausea    Patient complains of nausea, x1 week    HPI   Presents for several issues as follows  Relates 1 week history of slow urinary stream and nocturia.  Getting up 3-4 times at night.  Very slow stream.  He apparently had similar issues in the past and did well with Flomax.  Currently not on Flomax.  Denies any anticholinergic use.  Occasional mild burning with urination but not consistently.  No hematuria.  Does have history of frequent kidney stones.  He had a little bit of low-grade nausea without vomiting.  No localizing abdominal pains.  He had PSA 12/21 was 1.55.  Has history of hypertension.  Requesting refills of lisinopril 10 mg daily.  Also takes carvedilol 3.125 mg twice daily.  Blood pressure is apparently stable.  No recent headaches or dizziness.  Past Medical History:  Diagnosis Date   Arthritis    HANDS AND PT STATES HIS ARMS HURT   Brain tumor [facility])    age 66 - brain tumor removed left- benign - occas slight headaches since the surgery   Colonic mass 2001   Fatigue    x 1 week o as of 02-17-2020   Fibromyalgia 1990's    Frequent PVCs    Generalized body aches    x 1 week as of 02-17-2020   H/O inguinal hernia    right side   Hernia, umbilical    History of kidney stones    HX OF MULTIPLE KIDNEY STONES AND  CURRENTLY HAS BILATERAL RENAL STONES CAUSING ABDOMINAL AND BACK PAIN   Hypertension    high blood pressure readings    NSVT (nonsustained ventricular tachycardia) ([facility])    Premature atrial contractions    Prolonged Q-T interval on ECG    hx of none currently   Wears dentures    full dentures   Past Surgical History:  Procedure Laterality Date    BRAIN TUMOR EXCISION  age 21   CARDIAC CATHETERIZATION N/A 04/09/2015   Procedure: Left Heart Cath and Coronary Angiography;  Surgeon: Burnell Blanks, MD;  Location: [facility];  Service: Cardiovascular;  Laterality: N/A;   COLON SURGERY  2001   COLON RESECTION FOR GROWTH - NOT CANCER   CYSTOSCOPY WITH RETROGRADE PYELOGRAM, URETEROSCOPY AND STENT PLACEMENT Left 10/05/2012   Procedure: CYSTOSCOPY WITH RETROGRADE PYELOGRAM, URETEROSCOPY AND STENT PLACEMENT;  Surgeon: Alexis Frock, MD;  [facility];  Service: Urology;  Laterality: Left;   CYSTOSCOPY/RETROGRADE/URETEROSCOPY/STONE EXTRACTION WITH BASKET Right 10/05/2012   Procedure: CYSTOSCOPY/RETROGRADE/URETEROSCOPY/STONE EXTRACTION WITH BASKET;  Surgeon: Alexis Frock, MD;  [facility];  Service: Urology;  Laterality: Right;   CYSTOSCOPY/RETROGRADE/URETEROSCOPY/STONE EXTRACTION WITH BASKET Bilateral 10/24/2012   Procedure: CYSTOSCOPY/RETROGRADE/URETEROSCOPY/STONE EXTRACTION WITH BASKET;  Surgeon: Alexis Frock, MD;  [facility];  Service: Urology;  Laterality: Bilateral;  with bilateral stent placement   HOLMIUM LASER APPLICATION Right 05/16/8117   Procedure: HOLMIUM LASER APPLICATION;  Surgeon: Alexis Frock, MD;  [facility]: [facility];  Service: Urology;  Laterality: Right;   HOLMIUM LASER APPLICATION Bilateral 1/47/8295   Procedure: HOLMIUM LASER APPLICATION;  Surgeon: Alexis Frock, MD;  Location:  [facility];  Service: Urology;  Laterality: Bilateral;   LITHOTRIPSY     TONSILLECTOMY  age 37's   and adenoids    reports that he has never smoked. He has never used smokeless tobacco. He reports current alcohol use of about 7.0 standard drinks of alcohol per week. He reports that he does not currently use drugs. family history includes Colon cancer in his father; Heart disease in his father. Allergies  Allergen Reactions   Other Other (See Comments)    [place] - caused a severe rash   Prozac [Fluoxetine Hcl]     Made him  feel "loopy"   Sulfa Antibiotics Hives   Vicodin [Hydrocodone-Acetaminophen] Itching    Tolerates oxycodone   Erythromycin Rash    Many years ago an oral antibiotic caused a rash(wife and patient think it was erythromycin but they are not positive).    Review of Systems  Constitutional:  Negative for chills and fever.  Respiratory:  Negative for cough and shortness of breath.   Cardiovascular:  Negative for chest pain.  Gastrointestinal:  Positive for nausea. Negative for abdominal pain, diarrhea and vomiting.  Genitourinary:  Positive for dysuria and frequency. Negative for hematuria.      Objective:     BP 128/90 (BP Location: Left Arm, Patient Position: Sitting, Cuff Size: Normal)   Pulse 80   Temp 97.7 F (36.5 C) (Oral)   Ht 6' (1.829 m)   Wt 166 lb 11.2 oz (75.6 kg)   SpO2 98%   BMI 22.61 kg/m    Physical Exam Vitals reviewed.  Constitutional:      General: He is not in acute distress.    Appearance: He is not ill-appearing.  Cardiovascular:     Rate and Rhythm: Normal rate.  Pulmonary:     Effort: Pulmonary effort is normal.     Breath sounds: Normal breath sounds.  Abdominal:     Palpations: Abdomen is soft.  Genitourinary:    Comments: Prostate slightly enlarged-diffusely.  Nonboggy.  Nontender. Neurological:     Mental Status: He is alert.      Results for orders placed or performed in visit on 08/30/21  POCT Urinalysis Dipstick (Automated)  Result Value Ref Range   Color, UA yellow    Clarity, UA clear    Glucose, UA Negative Negative   Bilirubin, UA negative    Ketones, UA negative    Spec Grav, UA 1.015 1.010 - 1.025   Blood, UA negative    pH, UA 6.5 5.0 - 8.0   Protein, UA Negative Negative   Urobilinogen, UA 0.2 0.2 or 1.0 E.U./dL   Nitrite, UA negative    Leukocytes, UA Negative Negative      The ASCVD Risk score (Arnett DK, et al., 2019) failed to calculate for the following reasons:   The 2019 ASCVD risk score is only valid for  ages 31 to 62    Assessment & Plan:   #1 nocturia and slow urinary stream.  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  Prostate exam does not show any evidence for acute prostatitis.  Urine dipstick reveals no evidence for infection.  Negative for hemoglobin, nitrites, and leukocytes. -Avoid anticholinergic medications -Avoid late day use of caffeine -Get back on Flomax 0.4 mg nightly -Follow-up with primary if symptoms not improving next couple weeks  #2 hypertension.  Patient requesting refills of lisinopril 10 mg daily.  90-day refill sent and continue close follow-up with primary  #3 intermittent low-grade nausea.  Patient requesting refill of Zofran.  This was sent.  We recommended close follow-up with primary if symptoms persist to evaluate further.  He has had similar symptoms in the past.   No follow-ups on file.    Carolann Littler, MD

## 2021-09-17 ENCOUNTER — Encounter: Payer: Self-pay | Admitting: Adult Health

## 2021-09-17 ENCOUNTER — Ambulatory Visit (INDEPENDENT_AMBULATORY_CARE_PROVIDER_SITE_OTHER): Payer: Medicare Other | Admitting: Adult Health

## 2021-09-17 VITALS — BP 130/90 | HR 65 | Temp 98.5°F | Ht 72.0 in | Wt 169.0 lb

## 2021-09-17 DIAGNOSIS — I10 Essential (primary) hypertension: Secondary | ICD-10-CM | POA: Diagnosis not present

## 2021-09-17 DIAGNOSIS — R110 Nausea: Secondary | ICD-10-CM

## 2021-09-17 DIAGNOSIS — J0140 Acute pansinusitis, unspecified: Secondary | ICD-10-CM | POA: Diagnosis not present

## 2021-09-17 MED ORDER — CARVEDILOL 3.125 MG PO TABS: 3.1250 mg | ORAL_TABLET | Freq: Two times a day (BID) | ORAL | 1 refills | Status: DC

## 2021-09-17 MED ORDER — DOXYCYCLINE HYCLATE 100 MG PO CAPS: 100.0000 mg | ORAL_CAPSULE | Freq: Two times a day (BID) | ORAL | 0 refills | Status: DC

## 2021-09-17 MED ORDER — ONDANSETRON 4 MG PO TBDP: 4.0000 mg | ORAL_TABLET | Freq: Three times a day (TID) | ORAL | 0 refills | Status: DC | PRN

## 2021-09-17 NOTE — Progress Notes (Signed)
Subjective:    Patient ID: [name], male    DOB: 04-25-1940, 81 y.o.   MRN: [id number]  HPI 81 year old male who  has a past medical history of Arthritis, Brain tumor ([place]), Colonic mass (2001), Fatigue, Fibromyalgia (1990's ), Frequent PVCs, Generalized body aches, H/O inguinal hernia, Hernia, umbilical, History of kidney stones, Hypertension, NSVT (nonsustained ventricular tachycardia) ([place]), Premature atrial contractions, Prolonged Q-T interval on ECG, and Wears dentures.  He presents to the office today for generalized complaints x 3 weeks. His symptoms include that of dizziness, headaches, fatigue, sinus pressure, nausea, sinus congestion, and non productive cough.   He hasn't had any chest pain, congestion, SOB, fevers, or chills.   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 left- benign - occas slight headaches since the surgery   Colonic mass 2001   Fatigue    x 1 week o as of 02-17-2020   Fibromyalgia 1990's    Frequent PVCs    Generalized body aches    x 1 week as of 02-17-2020   H/O inguinal hernia    right side   Hernia, umbilical    History of kidney stones    HX OF MULTIPLE KIDNEY STONES AND  CURRENTLY HAS BILATERAL RENAL STONES CAUSING ABDOMINAL AND BACK PAIN   Hypertension    high blood pressure readings    NSVT (nonsustained ventricular tachycardia) ([facility])    Premature atrial contractions    Prolonged Q-T interval on ECG    hx of none currently   Wears dentures    full dentures    Social History   Socioeconomic History   Marital status: Single    Spouse name: Not on file   Number of children: Not on file    Years of education: Not on file   Highest education level: Not on file  Occupational History   Not on file  Tobacco Use   Smoking status: Never   Smokeless tobacco: Never  Vaping Use   Vaping Use: Never used  Substance and Sexual Activity   Alcohol use: Yes    Alcohol/week: 7.0 standard drinks of alcohol    Types: 7 Standard drinks or equivalent per week    Comment: 1 drink per day   Drug use: Not Currently   Sexual activity: Not on file  Other Topics Concern   Not on file  Social History Narrative   Retired from Museum/gallery curator    Has one son - lives local    Likes to play Tennis   Social Determinants of Health   Financial Resource Strain: [place]  (05/06/2021)   Overall Financial Resource Strain (CARDIA)    Difficulty of Paying Living Expenses: Not hard at all  Food Insecurity: No Food Insecurity (05/06/2021)   Hunger Vital Sign    Worried About Running Out of Food in the Last Year: Never true    Ran Out of Food in the Last Year: Never true  Transportation Needs: No Transportation Needs (05/06/2021)   PRAPARE - Transportation  Lack of Transportation (Medical): No    Lack of Transportation (Non-Medical): No  Physical Activity: Insufficiently Active (05/06/2021)   Exercise Vital Sign    Days of Exercise per Week: 1 day    Minutes of Exercise per Session: 90 min  Stress: No Stress Concern Present (05/06/2021)   [place]    Feeling of Stress : Not at all  Social Connections: Moderately Isolated (05/06/2021)   Social Connection and Isolation Panel [NHANES]    Frequency of Communication with Friends and Family: More than three times a week    Frequency of Social Gatherings with Friends and Family: More than three times a week    Attends Religious Services: Never    Marine scientist or Organizations: Yes    Attends Music therapist: More than 4 times per year    Marital Status: Never married   Intimate Partner Violence: Not At Risk (05/06/2021)   Humiliation, Afraid, Rape, and Kick questionnaire    Fear of Current or Ex-Partner: No    Emotionally Abused: No    Physically Abused: No    Sexually Abused: No    Past Surgical History:  Procedure Laterality Date   BRAIN TUMOR EXCISION  age 7   CARDIAC CATHETERIZATION N/A 04/09/2015   Procedure: Left Heart Cath and Coronary Angiography;  Surgeon: Burnell Blanks, MD;  Location: [facility];  Service: Cardiovascular;  Laterality: N/A;   COLON SURGERY  2001   COLON RESECTION FOR GROWTH - NOT CANCER   CYSTOSCOPY WITH RETROGRADE PYELOGRAM, URETEROSCOPY AND STENT PLACEMENT Left 10/05/2012   Procedure: CYSTOSCOPY WITH RETROGRADE PYELOGRAM, URETEROSCOPY AND STENT PLACEMENT;  Surgeon: Alexis Frock, MD;  Location: [facility];  Service: Urology;  Laterality: Left;   CYSTOSCOPY/RETROGRADE/URETEROSCOPY/STONE EXTRACTION WITH BASKET Right 10/05/2012   Procedure: CYSTOSCOPY/RETROGRADE/URETEROSCOPY/STONE EXTRACTION WITH BASKET;  Surgeon: Alexis Frock, MD;  Location: [facility];  Service: Urology;  Laterality: Right;   CYSTOSCOPY/RETROGRADE/URETEROSCOPY/STONE EXTRACTION WITH BASKET Bilateral 10/24/2012   Procedure: CYSTOSCOPY/RETROGRADE/URETEROSCOPY/STONE EXTRACTION WITH BASKET;  Surgeon: Alexis Frock, MD;  Location: [facility];  Service: Urology;  Laterality: Bilateral;  with bilateral stent placement   HOLMIUM LASER APPLICATION Right 08/06/5282   Procedure: HOLMIUM LASER APPLICATION;  Surgeon: Alexis Frock, MD;  Location: [facility];  Service: Urology;  Laterality: Right;   HOLMIUM LASER APPLICATION Bilateral 1/32/4401   Procedure: HOLMIUM LASER APPLICATION;  Surgeon: Alexis Frock, MD;  Location: [facility];  Service: Urology;  Laterality: Bilateral;   LITHOTRIPSY     TONSILLECTOMY  age 53's   and adenoids    Family History  Problem Relation Age of Onset   Colon cancer Father    Heart disease Father        quad bypass    Allergies   Allergen Reactions   Other Other (See Comments)    Mangos - caused a severe rash   Prozac [Fluoxetine Hcl]     Made him feel "loopy"   Sulfa Antibiotics Hives   Vicodin [Hydrocodone-Acetaminophen] Itching    Tolerates oxycodone   Erythromycin Rash    Many years ago an oral antibiotic caused a rash(wife and patient think it was erythromycin but they are not positive).    Current Outpatient Medications on File Prior to Visit  Medication Sig Dispense Refill   aspirin EC 81 MG tablet Take 1 tablet (81 mg total) by mouth daily. 90 tablet 3   benzonatate (TESSALON PERLES) 100 MG capsule 1-2 capsules twice daily as needed for  cough 30 capsule 0   Cranberry 1000 MG CAPS Take 1,000 mg by mouth daily.      ibuprofen (ADVIL,MOTRIN) 200 MG tablet Take 600 mg by mouth 2 (two) times daily.      lisinopril (ZESTRIL) 10 MG tablet Take 1 tablet (10 mg total) by mouth daily. 90 tablet 3   Misc Natural Products (OSTEO BI-FLEX ADV JOINT SHIELD PO) Take 2 capsules by mouth daily.     Multiple Vitamin (MULTIVITAMIN) tablet Take 1 tablet by mouth daily.     tamsulosin (FLOMAX) 0.4 MG CAPS capsule Take 1 capsule (0.4 mg total) by mouth daily. 30 capsule 3   No current facility-administered medications on file prior to visit.    BP (!) 130/90   Pulse 65   Temp 98.5 F (36.9 C) (Oral)   Ht 6' (1.829 m)   Wt 169 lb (76.7 kg)   SpO2 98%   BMI 22.92 kg/m       Objective:   Physical Exam Vitals and nursing note reviewed.  Constitutional:      Appearance: Normal appearance.  HENT:     Nose: Congestion present. No rhinorrhea.     Right Turbinates: Enlarged and swollen.     Left Turbinates: Enlarged and swollen.     Right Sinus: Maxillary sinus tenderness and frontal sinus tenderness present.     Left Sinus: Maxillary sinus tenderness and frontal sinus tenderness present.     Mouth/Throat:     Mouth: Mucous membranes are moist.     Pharynx: Oropharynx is clear.  Cardiovascular:     Rate and  Rhythm: Normal rate and regular rhythm.     Pulses: Normal pulses.     Heart sounds: Normal heart sounds.  Pulmonary:     Effort: Pulmonary effort is normal.     Breath sounds: Normal breath sounds.  Musculoskeletal:        General: Normal range of motion.  Skin:    General: Skin is warm and dry.  Neurological:     General: No focal deficit present.     Mental Status: He is alert and oriented to person, place, and time.  Psychiatric:        Mood and Affect: Mood normal.        Behavior: Behavior normal.        Thought Content: Thought content normal.        Judgment: Judgment normal.        Assessment & Plan:  1. Acute non-recurrent pansinusitis  - doxycycline (VIBRAMYCIN) 100 MG capsule; Take 1 capsule (100 mg total) by mouth 2 (two) times daily.  Dispense: 14 capsule; Refill: 0 - Follow up if no improvement in the next 3-4 days  2. Essential hypertension  - carvedilol (COREG) 3.125 MG tablet; Take 1 tablet (3.125 mg total) by mouth 2 (two) times daily.  Dispense: 180 tablet; Refill: 1  3. Nausea  - ondansetron (ZOFRAN ODT) 4 MG disintegrating tablet; Take 1 tablet (4 mg total) by mouth every 8 (eight) hours as needed for nausea or vomiting.  Dispense: 20 tablet; Refill: 0  Dorothyann Peng, NP

## 2021-10-21 ENCOUNTER — Ambulatory Visit (INDEPENDENT_AMBULATORY_CARE_PROVIDER_SITE_OTHER): Payer: Medicare Other | Admitting: Adult Health

## 2021-10-21 ENCOUNTER — Encounter: Payer: Self-pay | Admitting: Adult Health

## 2021-10-21 VITALS — BP 120/80 | HR 70 | Temp 98.7°F | Ht 69.5 in | Wt 170.0 lb

## 2021-10-21 DIAGNOSIS — R3912 Poor urinary stream: Secondary | ICD-10-CM

## 2021-10-21 DIAGNOSIS — N401 Enlarged prostate with lower urinary tract symptoms: Secondary | ICD-10-CM

## 2021-10-21 DIAGNOSIS — I10 Essential (primary) hypertension: Secondary | ICD-10-CM

## 2021-10-21 DIAGNOSIS — M159 Polyosteoarthritis, unspecified: Secondary | ICD-10-CM

## 2021-10-21 DIAGNOSIS — R42 Dizziness and giddiness: Secondary | ICD-10-CM | POA: Diagnosis not present

## 2021-10-21 LAB — COMPREHENSIVE METABOLIC PANEL
ALT: 18 U/L (ref 0–53)
AST: 27 U/L (ref 0–37)
Albumin: 4.2 g/dL (ref 3.5–5.2)
Alkaline Phosphatase: 90 U/L (ref 39–117)
BUN: 22 mg/dL (ref 6–23)
CO2: 30 mEq/L (ref 19–32)
Calcium: 9.9 mg/dL (ref 8.4–10.5)
Chloride: 100 mEq/L (ref 96–112)
Creatinine, Ser: 1.28 mg/dL (ref 0.40–1.50)
GFR: 52.98 mL/min — ABNORMAL LOW (ref >60.00)
Glucose, Bld: 69 mg/dL — ABNORMAL LOW (ref 70–99)
Potassium: 5.3 mEq/L — ABNORMAL HIGH (ref 3.5–5.1)
Sodium: 138 mEq/L (ref 135–145)
Total Bilirubin: 0.7 mg/dL (ref 0.2–1.2)
Total Protein: 7 g/dL (ref 6.0–8.3)

## 2021-10-21 LAB — CBC WITH DIFFERENTIAL/PLATELET
Basophils Absolute: 0.1 10*3/uL (ref 0.0–0.1)
Basophils Relative: 0.9 % (ref 0.0–3.0)
Eosinophils Absolute: 0.1 10*3/uL (ref 0.0–0.7)
Eosinophils Relative: 1 % (ref 0.0–5.0)
HCT: 44.4 % (ref 39.0–52.0)
Hemoglobin: 14.9 g/dL (ref 13.0–17.0)
Lymphocytes Relative: 21.9 % (ref 12.0–46.0)
Lymphs Abs: 1.3 10*3/uL (ref 0.7–4.0)
MCHC: 33.6 g/dL (ref 30.0–36.0)
MCV: 98.9 fl (ref 78.0–100.0)
Monocytes Absolute: 0.5 10*3/uL (ref 0.1–1.0)
Monocytes Relative: 8.9 % (ref 3.0–12.0)
Neutro Abs: 3.8 10*3/uL (ref 1.4–7.7)
Neutrophils Relative %: 67.3 % (ref 43.0–77.0)
Platelets: 276 10*3/uL (ref 150.0–400.0)
RBC: 4.49 Mil/uL (ref 4.22–5.81)
RDW: 13.2 % (ref 11.5–15.5)
WBC: 5.7 10*3/uL (ref 4.0–10.5)

## 2021-10-21 LAB — LIPID PANEL
Cholesterol: 219 mg/dL — ABNORMAL HIGH (ref 0–200)
HDL: 38.5 mg/dL — ABNORMAL LOW (ref >39.00)
LDL Cholesterol: 148 mg/dL — ABNORMAL HIGH (ref 0–99)
NonHDL: 180.08
Total CHOL/HDL Ratio: 6
Triglycerides: 162 mg/dL — ABNORMAL HIGH (ref 0.0–149.0)
VLDL: 32.4 mg/dL (ref 0.0–40.0)

## 2021-10-21 LAB — PSA: PSA: 1.93 ng/mL (ref 0.10–4.00)

## 2021-10-21 LAB — TSH: TSH: 1.61 u[IU]/mL (ref 0.35–5.50)

## 2021-10-21 MED ORDER — ONDANSETRON HCL 4 MG PO TABS: 4.0000 mg | ORAL_TABLET | Freq: Three times a day (TID) | ORAL | 1 refills | Status: DC | PRN

## 2021-10-21 MED ORDER — MECLIZINE HCL 25 MG PO TABS: 25.0000 mg | ORAL_TABLET | Freq: Three times a day (TID) | ORAL | 2 refills | Status: DC | PRN

## 2021-10-21 NOTE — Progress Notes (Signed)
Subjective:    Patient ID: [name], male    DOB: 01-19-1941, 81 y.o.   MRN: [id number]  HPI Patient presents for yearly preventative medicine examination. He is a pleasant 81 year old male who  has a past medical history of Arthritis, Brain tumor ([place]), Colonic mass (2001), Fatigue, Fibromyalgia (1990's ), Frequent PVCs, Generalized body aches, H/O inguinal hernia, Hernia, umbilical, History of kidney stones, Hypertension, NSVT (nonsustained ventricular tachycardia) ([place]), Premature atrial contractions, Prolonged Q-T interval on ECG, and Wears dentures.  Hypertension-managed with lisinopril 10 mg daily and Coreg 3.125 mg BID.  He denies dizziness, lightheadedness, chest pain, or shortness of breath BP Readings from Last 3 Encounters:  10/21/21 120/80  09/17/21 (!) 130/90  08/30/21 128/90   BPH-uses Flomax 0.4 mg daily.  He does feel well controlled on this medication  Osteoarthritis - in his hands and knees. Uses OTC medications   Vertigo -this has been an ongoing issue for many years.  Has taken meclizine in the past, last being in 2021 and believes it worked well for him.  He has had pretty constant vertigo over the last month to month and a half which causes ringing in his ears, dizziness, and nausea.  He feels like he is "drunk all the time" and has unsteady gait.  He has not had any falls.  All immunizations and health maintenance protocols were reviewed with the patient and needed orders were placed. Refuses flu vacciantion   Appropriate screening laboratory values were ordered for the patient including screening of hyperlipidemia, renal function and hepatic function. If indicated by BPH, a PSA was ordered.  Medication reconciliation,  past medical history, social history, problem list and allergies were reviewed in detail with the patient  Goals were established with regard to weight loss, exercise, and  diet in compliance with medications Wt Readings from Last 3  Encounters:  10/21/21 170 lb (77.1 kg)  09/17/21 169 lb (76.7 kg)  08/30/21 166 lb 11.2 oz (75.6 kg)   Review of Systems  Constitutional: Negative.   HENT:  Positive for tinnitus.   Eyes: Negative.   Respiratory: Negative.    Cardiovascular: Negative.   Gastrointestinal:  Positive for nausea.  Endocrine: Negative.   Genitourinary: Negative.   Musculoskeletal:  Positive for arthralgias.  Skin: Negative.   Allergic/Immunologic: Negative.   Neurological:  Positive for dizziness.  Hematological: Negative.   Psychiatric/Behavioral: Negative.    All other systems reviewed and are negative.  Past Medical History:  Diagnosis Date   Arthritis    HANDS AND PT STATES HIS ARMS HURT   Brain tumor [facility])    age 35 - brain tumor removed left- benign - occas slight headaches since the surgery   Colonic mass 2001   Fatigue    x 1 week o as of 02-17-2020   Fibromyalgia 1990's    Frequent PVCs    Generalized body aches    x 1 week as of 02-17-2020   H/O inguinal hernia    right side   Hernia, umbilical    History of kidney stones    HX OF MULTIPLE KIDNEY STONES AND  CURRENTLY HAS BILATERAL RENAL STONES CAUSING ABDOMINAL AND BACK PAIN   Hypertension    high blood pressure readings    NSVT (nonsustained ventricular tachycardia) ([facility])    Premature atrial contractions    Prolonged Q-T interval on ECG    hx of none currently   Wears dentures    full dentures  Social History   Socioeconomic History   Marital status: Single    Spouse name: Not on file   Number of children: Not on file   Years of education: Not on file   Highest education level: Not on file  Occupational History   Not on file  Tobacco Use   Smoking status: Never   Smokeless tobacco: Never  Vaping Use   Vaping Use: Never used  Substance and Sexual Activity   Alcohol use: Yes    Alcohol/week: 7.0 standard drinks of alcohol    Types: 7 Standard drinks or equivalent per week    Comment: 1 drink per day   Drug  use: Not Currently   Sexual activity: Not on file  Other Topics Concern   Not on file  Social History Narrative   Retired from Museum/gallery curator    Has one son - lives local    Likes to play Tennis   Social Determinants of Health   Financial Resource Strain: [place]  (05/06/2021)   Overall Financial Resource Strain (CARDIA)    Difficulty of Paying Living Expenses: Not hard at all  Food Insecurity: No Food Insecurity (05/06/2021)   Hunger Vital Sign    Worried About Running Out of Food in the Last Year: Never true    [place] in the Last Year: Never true  Transportation Needs: No Transportation Needs (05/06/2021)   PRAPARE - Hydrologist (Medical): No    Lack of Transportation (Non-Medical): No  Physical Activity: Insufficiently Active (05/06/2021)   Exercise Vital Sign    Days of Exercise per Week: 1 day    Minutes of Exercise per Session: 90 min  Stress: No Stress Concern Present (05/06/2021)   [place]    Feeling of Stress : Not at all  Social Connections: Moderately Isolated (05/06/2021)   Social Connection and Isolation Panel [NHANES]    Frequency of Communication with Friends and Family: More than three times a week    Frequency of Social Gatherings with Friends and Family: More than three times a week    Attends Religious Services: Never    Marine scientist or Organizations: Yes    Attends Music therapist: More than 4 times per year    Marital Status: Never married  Intimate Partner Violence: Not At Risk (05/06/2021)   Humiliation, Afraid, Rape, and Kick questionnaire    Fear of Current or Ex-Partner: No    Emotionally Abused: No    Physically Abused: No    Sexually Abused: No    Past Surgical History:  Procedure Laterality Date   BRAIN TUMOR EXCISION  age 33   CARDIAC CATHETERIZATION N/A 04/09/2015   Procedure: Left Heart Cath and Coronary Angiography;   Surgeon: Burnell Blanks, MD;  Location: [facility];  Service: Cardiovascular;  Laterality: N/A;   COLON SURGERY  2001   COLON RESECTION FOR GROWTH - NOT CANCER   CYSTOSCOPY WITH RETROGRADE PYELOGRAM, URETEROSCOPY AND STENT PLACEMENT Left 10/05/2012   Procedure: CYSTOSCOPY WITH RETROGRADE PYELOGRAM, URETEROSCOPY AND STENT PLACEMENT;  Surgeon: Alexis Frock, MD;  Location: [facility];  Service: Urology;  Laterality: Left;   CYSTOSCOPY/RETROGRADE/URETEROSCOPY/STONE EXTRACTION WITH BASKET Right 10/05/2012   Procedure: CYSTOSCOPY/RETROGRADE/URETEROSCOPY/STONE EXTRACTION WITH BASKET;  Surgeon: Alexis Frock, MD;  Location: [facility];  Service: Urology;  Laterality: Right;   CYSTOSCOPY/RETROGRADE/URETEROSCOPY/STONE EXTRACTION WITH BASKET Bilateral 10/24/2012   Procedure: CYSTOSCOPY/RETROGRADE/URETEROSCOPY/STONE EXTRACTION WITH BASKET;  Surgeon:  Alexis Frock, MD;  Location: [facility];  Service: Urology;  Laterality: Bilateral;  with bilateral stent placement   HOLMIUM LASER APPLICATION Right 5/36/6440   Procedure: HOLMIUM LASER APPLICATION;  Surgeon: Alexis Frock, MD;  Location: [facility];  Service: Urology;  Laterality: Right;   HOLMIUM LASER APPLICATION Bilateral 3/47/4259   Procedure: HOLMIUM LASER APPLICATION;  Surgeon: Alexis Frock, MD;  Location: [facility];  Service: Urology;  Laterality: Bilateral;   LITHOTRIPSY     TONSILLECTOMY  age 25's   and adenoids    Family History  Problem Relation Age of Onset   Colon cancer Father    Heart disease Father        quad bypass    Allergies  Allergen Reactions   Other Other (See Comments)    Mangos - caused a severe rash   Prozac [Fluoxetine Hcl]     Made him feel "loopy"   Sulfa Antibiotics Hives   Vicodin [Hydrocodone-Acetaminophen] Itching    Tolerates oxycodone   Erythromycin Rash    Many years ago an oral antibiotic caused a rash(wife and patient think it was erythromycin but they are not positive).    Current Outpatient  Medications on File Prior to Visit  Medication Sig Dispense Refill   aspirin EC 81 MG tablet Take 1 tablet (81 mg total) by mouth daily. 90 tablet 3   carvedilol (COREG) 3.125 MG tablet Take 1 tablet (3.125 mg total) by mouth 2 (two) times daily. 180 tablet 1   Cranberry 1000 MG CAPS Take 1,000 mg by mouth daily.      ibuprofen (ADVIL,MOTRIN) 200 MG tablet Take 600 mg by mouth 2 (two) times daily.      lisinopril (ZESTRIL) 10 MG tablet Take 1 tablet (10 mg total) by mouth daily. 90 tablet 3   Misc Natural Products (OSTEO BI-FLEX ADV JOINT SHIELD PO) Take 2 capsules by mouth daily.     Multiple Vitamin (MULTIVITAMIN) tablet Take 1 tablet by mouth daily.     tamsulosin (FLOMAX) 0.4 MG CAPS capsule Take 1 capsule (0.4 mg total) by mouth daily. 30 capsule 3   No current facility-administered medications on file prior to visit.    BP 120/80   Pulse 70   Temp 98.7 F (37.1 C) (Oral)   Ht 5' 9.5" (1.765 m)   Wt 170 lb (77.1 kg)   SpO2 96%   BMI 24.74 kg/m       Objective:   Physical Exam Vitals and nursing note reviewed.  Constitutional:      Appearance: Normal appearance.  Eyes:     Extraocular Movements:     Right eye: Nystagmus (horizontal) present.     Left eye: Nystagmus (horizontal) present.  Cardiovascular:     Rate and Rhythm: Normal rate and regular rhythm.     Pulses: Normal pulses.     Heart sounds: Normal heart sounds.  Pulmonary:     Effort: Pulmonary effort is normal.     Breath sounds: Normal breath sounds.  Musculoskeletal:        General: Normal range of motion.  Skin:    General: Skin is warm and dry.  Neurological:     General: No focal deficit present.     Mental Status: He is alert and oriented to person, place, and time.     Comments: Became very dizzy in the office today with change of positions from laying down to sitting up quickly on the exam table  Psychiatric:  Mood and Affect: Mood normal.        Behavior: Behavior normal.         Thought Content: Thought content normal.       Assessment & Plan:  1. Essential hypertension - Well controlled. No change in medications  - CBC with Differential/Platelet; Future - Comprehensive metabolic panel; Future - Lipid panel; Future - TSH; Future - TSH - Lipid panel - Comprehensive metabolic panel - CBC with Differential/Platelet  2. Benign prostatic hyperplasia with weak urinary stream - Continue with Flomax  - PSA; Future - PSA  3. Primary osteoarthritis involving multiple joints - Continue with OTC  4. Vertigo -Discussed referral to vestibular rehab.  He would like to try meclizine and see how he does with the medication.  He plans on following up in 2 weeks to see how everything is going - meclizine (ANTIVERT) 25 MG tablet; Take 1 tablet (25 mg total) by mouth 3 (three) times daily as needed for dizziness.  Dispense: 30 tablet; Refill: 2  Dorothyann Peng, NP

## 2021-10-22 ENCOUNTER — Other Ambulatory Visit: Payer: Self-pay

## 2021-10-22 MED ORDER — ATORVASTATIN CALCIUM 10 MG PO TABS: 10.0000 mg | ORAL_TABLET | Freq: Every day | ORAL | 2 refills | Status: DC

## 2021-11-12 ENCOUNTER — Ambulatory Visit (INDEPENDENT_AMBULATORY_CARE_PROVIDER_SITE_OTHER): Payer: Medicare Other | Admitting: Adult Health

## 2021-11-12 ENCOUNTER — Encounter: Payer: Self-pay | Admitting: Adult Health

## 2021-11-12 VITALS — BP 136/80 | HR 59 | Temp 98.4°F | Wt 171.6 lb

## 2021-11-12 DIAGNOSIS — F419 Anxiety disorder, unspecified: Secondary | ICD-10-CM | POA: Diagnosis not present

## 2021-11-12 DIAGNOSIS — M159 Polyosteoarthritis, unspecified: Secondary | ICD-10-CM

## 2021-11-12 DIAGNOSIS — M150 Primary generalized (osteo)arthritis: Secondary | ICD-10-CM

## 2021-11-12 DIAGNOSIS — H8113 Benign paroxysmal vertigo, bilateral: Secondary | ICD-10-CM | POA: Diagnosis not present

## 2021-11-12 MED ORDER — DULOXETINE HCL 30 MG PO CPEP: 30.0000 mg | ORAL_CAPSULE | Freq: Every day | ORAL | 0 refills | Status: DC

## 2021-11-12 NOTE — Progress Notes (Signed)
Subjective:    Patient ID: [name], male    DOB: 04-Jun-1940, 81 y.o.   MRN: [id number]  HPI 82 year old male who  has a past medical history of Arthritis, Brain tumor ([place]), Colonic mass (2001), Fatigue, Fibromyalgia (1990's ), Frequent PVCs, Generalized body aches, H/O inguinal hernia, Hernia, umbilical, History of kidney stones, Hypertension, NSVT (nonsustained ventricular tachycardia) ([place]), Premature atrial contractions, Prolonged Q-T interval on ECG, and Wears dentures.  He presents to the office today for 2-week follow-up regarding vertigo.  When he was last seen pretty constant vertigo like symptoms over the previous month to month and a half which was causing ringing in his ears, dizziness, and nausea.  He stated that he felt "drunk all the time".  And had unsteady gait.  In the past he used meclizine and he believes that this worked well.  His meclizine was renewed, we discussed vestibular rehab but he wanted to try medication therapy first.  Today he reports that his dizziness has improved significantly. He has not had to take meclizine in the last 4-5 days. He has been able to get back to working on his house and feels better overall.  His biggest complaint today is that of worsening osteoarthritic pain especially in his shoulders and knees, he has been using over-the-counter anti-inflammatories but this does not seem to be working as well any longer.  He also reports increase in his anxiety which happens when he is in groups of people.  He feels as though this is getting worse.  In the past he was able to manage his symptoms but does not feel as though he can manage them now and is reluctant to do things that he once enjoyed due to the anxiety.   Review of Systems See HPI   Past Medical History:  Diagnosis Date   Arthritis    HANDS AND PT STATES HIS ARMS HURT   Brain tumor [facility])    age 45 - brain tumor removed left- benign - occas slight headaches since the surgery    Colonic mass 2001   Fatigue    x 1 week o as of 02-17-2020   Fibromyalgia 1990's    Frequent PVCs    Generalized body aches    x 1 week as of 02-17-2020   H/O inguinal hernia    right side   Hernia, umbilical    History of kidney stones    HX OF MULTIPLE KIDNEY STONES AND  CURRENTLY HAS BILATERAL RENAL STONES CAUSING ABDOMINAL AND BACK PAIN   Hypertension    high blood pressure readings    NSVT (nonsustained ventricular tachycardia) ([facility])    Premature atrial contractions    Prolonged Q-T interval on ECG    hx of none currently   Wears dentures    full dentures    Social History   Socioeconomic History   Marital status: Single    Spouse name: Not on file   Number of children: Not on file   Years of education: Not on file   Highest education level: Not on file  Occupational History   Not on file  Tobacco Use   Smoking status: Never   Smokeless tobacco: Never  Vaping Use   Vaping Use: Never used  Substance and Sexual Activity   Alcohol use: Yes    Alcohol/week: 7.0 standard drinks of alcohol    Types: 7 Standard drinks or equivalent per week    Comment: 1 drink per day  Drug use: Not Currently   Sexual activity: Not on file  Other Topics Concern   Not on file  Social History Narrative   Retired from Museum/gallery curator    Has one son - lives local    Likes to play Tennis   Social Determinants of Health   Financial Resource Strain: [place]  (05/06/2021)   Overall Financial Resource Strain (CARDIA)    Difficulty of Paying Living Expenses: Not hard at all  Food Insecurity: No Food Insecurity (05/06/2021)   Hunger Vital Sign    Worried About Running Out of Food in the Last Year: Never true    [place] in the Last Year: Never true  Transportation Needs: No Transportation Needs (05/06/2021)   PRAPARE - Hydrologist (Medical): No    Lack of Transportation (Non-Medical): No  Physical Activity: Insufficiently Active (05/06/2021)   Exercise  Vital Sign    Days of Exercise per Week: 1 day    Minutes of Exercise per Session: 90 min  Stress: No Stress Concern Present (05/06/2021)   [place]    Feeling of Stress : Not at all  Social Connections: Moderately Isolated (05/06/2021)   Social Connection and Isolation Panel [NHANES]    Frequency of Communication with Friends and Family: More than three times a week    Frequency of Social Gatherings with Friends and Family: More than three times a week    Attends Religious Services: Never    Marine scientist or Organizations: Yes    Attends Music therapist: More than 4 times per year    Marital Status: Never married  Intimate Partner Violence: Not At Risk (05/06/2021)   Humiliation, Afraid, Rape, and Kick questionnaire    Fear of Current or Ex-Partner: No    Emotionally Abused: No    Physically Abused: No    Sexually Abused: No    Past Surgical History:  Procedure Laterality Date   BRAIN TUMOR EXCISION  age 62   CARDIAC CATHETERIZATION N/A 04/09/2015   Procedure: Left Heart Cath and Coronary Angiography;  Surgeon: Burnell Blanks, MD;  Location: [place] [facility];  Service: Cardiovascular;  Laterality: N/A;   COLON SURGERY  2001   COLON RESECTION FOR GROWTH - NOT CANCER   CYSTOSCOPY WITH RETROGRADE PYELOGRAM, URETEROSCOPY AND STENT PLACEMENT Left 10/05/2012   Procedure: CYSTOSCOPY WITH RETROGRADE PYELOGRAM, URETEROSCOPY AND STENT PLACEMENT;  Surgeon: Alexis Frock, MD;  Location: [facility];  Service: Urology;  Laterality: Left;   CYSTOSCOPY/RETROGRADE/URETEROSCOPY/STONE EXTRACTION WITH BASKET Right 10/05/2012   Procedure: CYSTOSCOPY/RETROGRADE/URETEROSCOPY/STONE EXTRACTION WITH BASKET;  Surgeon: Alexis Frock, MD;  Location: [facility];  Service: Urology;  Laterality: Right;   CYSTOSCOPY/RETROGRADE/URETEROSCOPY/STONE EXTRACTION WITH BASKET Bilateral 10/24/2012   Procedure:  CYSTOSCOPY/RETROGRADE/URETEROSCOPY/STONE EXTRACTION WITH BASKET;  Surgeon: Alexis Frock, MD;  Location: [facility];  Service: Urology;  Laterality: Bilateral;  with bilateral stent placement   HOLMIUM LASER APPLICATION Right 09/24/5629   Procedure: HOLMIUM LASER APPLICATION;  Surgeon: Alexis Frock, MD;  Location: [facility];  Service: Urology;  Laterality: Right;   HOLMIUM LASER APPLICATION Bilateral 4/97/0263   Procedure: HOLMIUM LASER APPLICATION;  Surgeon: Alexis Frock, MD;  Location: [facility];  Service: Urology;  Laterality: Bilateral;   LITHOTRIPSY     TONSILLECTOMY  age 33's   and adenoids    Family History  Problem Relation Age of Onset   Colon cancer Father    Heart disease Father  quad bypass    Allergies  Allergen Reactions   Other Other (See Comments)    Mangos - caused a severe rash   Prozac [Fluoxetine Hcl]     Made him feel "loopy"   Sulfa Antibiotics Hives   Vicodin [Hydrocodone-Acetaminophen] Itching    Tolerates oxycodone   Erythromycin Rash    Many years ago an oral antibiotic caused a rash(wife and patient think it was erythromycin but they are not positive).    Current Outpatient Medications on File Prior to Visit  Medication Sig Dispense Refill   aspirin EC 81 MG tablet Take 1 tablet (81 mg total) by mouth daily. 90 tablet 3   atorvastatin (LIPITOR) 10 MG tablet Take 1 tablet (10 mg total) by mouth daily. 90 tablet 2   carvedilol (COREG) 3.125 MG tablet Take 1 tablet (3.125 mg total) by mouth 2 (two) times daily. 180 tablet 1   Cranberry 1000 MG CAPS Take 1,000 mg by mouth daily.      ibuprofen (ADVIL,MOTRIN) 200 MG tablet Take 600 mg by mouth 2 (two) times daily.      lisinopril (ZESTRIL) 10 MG tablet Take 1 tablet (10 mg total) by mouth daily. 90 tablet 3   meclizine (ANTIVERT) 25 MG tablet Take 1 tablet (25 mg total) by mouth 3 (three) times daily as needed for dizziness. 30 tablet 2   Misc Natural Products (OSTEO BI-FLEX ADV JOINT SHIELD PO) Take 2  capsules by mouth daily.     Multiple Vitamin (MULTIVITAMIN) tablet Take 1 tablet by mouth daily.     ondansetron (ZOFRAN) 4 MG tablet Take 1 tablet (4 mg total) by mouth every 8 (eight) hours as needed for nausea or vomiting. 20 tablet 1   tamsulosin (FLOMAX) 0.4 MG CAPS capsule Take 1 capsule (0.4 mg total) by mouth daily. 30 capsule 3   No current facility-administered medications on file prior to visit.    There were no vitals taken for this visit.      Objective:   Physical Exam Vitals and nursing note reviewed.  Constitutional:      Appearance: Normal appearance.  Cardiovascular:     Rate and Rhythm: Normal rate and regular rhythm.     Pulses: Normal pulses.     Heart sounds: Normal heart sounds.  Pulmonary:     Effort: Pulmonary effort is normal.     Breath sounds: Normal breath sounds.  Musculoskeletal:        General: Normal range of motion.  Skin:    General: Skin is warm and dry.  Neurological:     General: No focal deficit present.     Mental Status: He is alert and oriented to person, place, and time.  Psychiatric:        Mood and Affect: Mood normal.        Behavior: Behavior normal.        Thought Content: Thought content normal.        Judgment: Judgment normal.        Assessment & Plan:  1. Benign paroxysmal positional vertigo due to bilateral vestibular disorder - Resolved. Can take Meclizine as needed  2. Anxiety - Will trial him on Cymbalta 30 mg for 30 days for both anxiety and MSK pain. He will follow up in one month - DULoxetine (CYMBALTA) 30 MG capsule; Take 1 capsule (30 mg total) by mouth daily.  Dispense: 30 capsule; Refill: 0  3. Primary osteoarthritis involving multiple joints  - DULoxetine (CYMBALTA) 30 MG  capsule; Take 1 capsule (30 mg total) by mouth daily.  Dispense: 30 capsule; Refill: 0  Dorothyann Peng, NP  Time spent with patient today was 30 minutes which consisted of chart review, discussing vertigo, anxiety, and  osteoarthritis  work up, treatment answering questions and documentation.

## 2021-11-25 ENCOUNTER — Other Ambulatory Visit: Payer: Self-pay | Admitting: Family Medicine

## 2021-12-05 ENCOUNTER — Other Ambulatory Visit: Payer: Self-pay | Admitting: Adult Health

## 2021-12-05 DIAGNOSIS — F419 Anxiety disorder, unspecified: Secondary | ICD-10-CM

## 2021-12-05 DIAGNOSIS — M159 Polyosteoarthritis, unspecified: Secondary | ICD-10-CM

## 2021-12-14 ENCOUNTER — Encounter: Payer: Self-pay | Admitting: Adult Health

## 2021-12-14 ENCOUNTER — Ambulatory Visit (INDEPENDENT_AMBULATORY_CARE_PROVIDER_SITE_OTHER): Payer: Medicare Other | Admitting: Adult Health

## 2021-12-14 VITALS — BP 104/60 | HR 78 | Temp 98.4°F | Ht 69.5 in | Wt 168.0 lb

## 2021-12-14 DIAGNOSIS — F419 Anxiety disorder, unspecified: Secondary | ICD-10-CM

## 2021-12-14 DIAGNOSIS — J0101 Acute recurrent maxillary sinusitis: Secondary | ICD-10-CM | POA: Diagnosis not present

## 2021-12-14 DIAGNOSIS — M159 Polyosteoarthritis, unspecified: Secondary | ICD-10-CM

## 2021-12-14 MED ORDER — DULOXETINE HCL 30 MG PO CPEP: 30.0000 mg | ORAL_CAPSULE | Freq: Every day | ORAL | 0 refills | Status: DC

## 2021-12-14 MED ORDER — ONDANSETRON 4 MG PO TBDP: 4.0000 mg | ORAL_TABLET | Freq: Three times a day (TID) | ORAL | 1 refills | Status: DC | PRN

## 2021-12-14 MED ORDER — AMOXICILLIN-POT CLAVULANATE 875-125 MG PO TABS: 1.0000 | ORAL_TABLET | Freq: Two times a day (BID) | ORAL | 0 refills | Status: DC

## 2021-12-14 NOTE — Progress Notes (Signed)
Subjective:    Patient ID: [name], male    DOB: 02-17-1940, 81 y.o.   MRN: [id number]  HPI 81 year old male who is being evaluated today for follow-up regarding anxiety and osteoarthritis.  1 month ago he was started on Cymbalta 30 mg daily.  He reports some significant improvement in his osteoarthritic pain was taking the Cymbalta.  The morning continues to be the worst time for him but once he gets moving the pain does improve as well.  He has also noticed some decreased anxiety since starting Cymbalta.  Biggest side effect noticed is drowsiness during the day.  He also reports ports and a recurrent sinusitis like symptoms or a sinus infection that was never resolved from back in August.  Symptoms include discolored nasal drainage with blood, congestion, headaches, bilateral ear discomfort, and sinus pain and pressure.  He believes that this is causing more of a generalized fatigue and the Cymbalta.  He denies fevers or chills   Review of Systems See HPI   Past Medical History:  Diagnosis Date   Arthritis    HANDS AND PT STATES HIS ARMS HURT   Brain tumor [facility])    age 54 - brain tumor removed left- benign - occas slight headaches since the surgery   Colonic mass 2001   Fatigue    x 1 week o as of 02-17-2020   Fibromyalgia 1990's    Frequent PVCs    Generalized body aches    x 1 week as of 02-17-2020   H/O inguinal hernia    right side   Hernia, umbilical    History of kidney stones    HX OF MULTIPLE KIDNEY STONES AND  CURRENTLY HAS BILATERAL RENAL STONES CAUSING ABDOMINAL AND BACK PAIN   Hypertension    high blood pressure readings    NSVT (nonsustained ventricular tachycardia) ([facility])    Premature atrial contractions    Prolonged Q-T interval on ECG    hx of none currently   Wears dentures    full dentures    Social History   Socioeconomic History   Marital status: Single    Spouse name: Not on file   Number of children: Not on file   Years of education: Not  on file   Highest education level: Not on file  Occupational History   Not on file  Tobacco Use   Smoking status: Never   Smokeless tobacco: Never  Vaping Use   Vaping Use: Never used  Substance and Sexual Activity   Alcohol use: Yes    Alcohol/week: 7.0 standard drinks of alcohol    Types: 7 Standard drinks or equivalent per week    Comment: 1 drink per day   Drug use: Not Currently   Sexual activity: Not on file  Other Topics Concern   Not on file  Social History Narrative   Retired from Museum/gallery curator    Has one son - lives local    Likes to play Tennis   Social Determinants of Health   Financial Resource Strain: [place]  (05/06/2021)   Overall Financial Resource Strain (CARDIA)    Difficulty of Paying Living Expenses: Not hard at all  Food Insecurity: No Food Insecurity (05/06/2021)   Hunger Vital Sign    Worried About Running Out of Food in the Last Year: Never true    Ran Out of Food in the Last Year: Never true  Transportation Needs: No Transportation Needs (05/06/2021)   PRAPARE - Transportation  Lack of Transportation (Medical): No    Lack of Transportation (Non-Medical): No  Physical Activity: Insufficiently Active (05/06/2021)   Exercise Vital Sign    Days of Exercise per Week: 1 day    Minutes of Exercise per Session: 90 min  Stress: No Stress Concern Present (05/06/2021)   [place]    Feeling of Stress : Not at all  Social Connections: Moderately Isolated (05/06/2021)   Social Connection and Isolation Panel [NHANES]    Frequency of Communication with Friends and Family: More than three times a week    Frequency of Social Gatherings with Friends and Family: More than three times a week    Attends Religious Services: Never    Marine scientist or Organizations: Yes    Attends Music therapist: More than 4 times per year    Marital Status: Never married  Intimate Partner  Violence: Not At Risk (05/06/2021)   Humiliation, Afraid, Rape, and Kick questionnaire    Fear of Current or Ex-Partner: No    Emotionally Abused: No    Physically Abused: No    Sexually Abused: No    Past Surgical History:  Procedure Laterality Date   BRAIN TUMOR EXCISION  age 59   CARDIAC CATHETERIZATION N/A 04/09/2015   Procedure: Left Heart Cath and Coronary Angiography;  Surgeon: Burnell Blanks, MD;  Location: [place] [facility];  Service: Cardiovascular;  Laterality: N/A;   COLON SURGERY  2001   COLON RESECTION FOR GROWTH - NOT CANCER   CYSTOSCOPY WITH RETROGRADE PYELOGRAM, URETEROSCOPY AND STENT PLACEMENT Left 10/05/2012   Procedure: CYSTOSCOPY WITH RETROGRADE PYELOGRAM, URETEROSCOPY AND STENT PLACEMENT;  Surgeon: Alexis Frock, MD;  [facility];  Service: Urology;  Laterality: Left;   CYSTOSCOPY/RETROGRADE/URETEROSCOPY/STONE EXTRACTION WITH BASKET Right 10/05/2012   Procedure: CYSTOSCOPY/RETROGRADE/URETEROSCOPY/STONE EXTRACTION WITH BASKET;  Surgeon: Alexis Frock, MD;  [facility];  Service: Urology;  Laterality: Right;   CYSTOSCOPY/RETROGRADE/URETEROSCOPY/STONE EXTRACTION WITH BASKET Bilateral 10/24/2012   Procedure: CYSTOSCOPY/RETROGRADE/URETEROSCOPY/STONE EXTRACTION WITH BASKET;  Surgeon: Alexis Frock, MD;  [facility];  Service: Urology;  Laterality: Bilateral;  with bilateral stent placement   HOLMIUM LASER APPLICATION Right 05/16/8117   Procedure: HOLMIUM LASER APPLICATION;  Surgeon: Alexis Frock, MD;  [facility];  Service: Urology;  Laterality: Right;   HOLMIUM LASER APPLICATION Bilateral 1/47/8295   Procedure: HOLMIUM LASER APPLICATION;  Surgeon: Alexis Frock, MD;  [facility];  Service: Urology;  Laterality: Bilateral;   LITHOTRIPSY     TONSILLECTOMY  age 62's   and adenoids    Family History  Problem Relation Age of Onset   Colon cancer Father    Heart disease Father        quad bypass    Allergies  Allergen Reactions    Other Other (See Comments)    [place] - caused a severe rash   Prozac [Fluoxetine Hcl]     Made him feel "loopy"   Sulfa Antibiotics Hives   Vicodin [Hydrocodone-Acetaminophen] Itching    Tolerates oxycodone   Erythromycin Rash    Many years ago an oral antibiotic caused a rash(wife and patient think it was erythromycin but they are not positive).    Current Outpatient Medications on File Prior to Visit  Medication Sig Dispense Refill   aspirin EC 81 MG tablet Take 1 tablet (81 mg total) by mouth daily. 90 tablet 3   atorvastatin (LIPITOR) 10 MG tablet Take 1 tablet (10 mg total) by mouth  daily. 90 tablet 2   carvedilol (COREG) 3.125 MG tablet Take 1 tablet (3.125 mg total) by mouth 2 (two) times daily. 180 tablet 1   Cranberry 1000 MG CAPS Take 1,000 mg by mouth daily.      ibuprofen (ADVIL,MOTRIN) 200 MG tablet Take 600 mg by mouth 2 (two) times daily.      lisinopril (ZESTRIL) 10 MG tablet Take 1 tablet (10 mg total) by mouth daily. 90 tablet 3   meclizine (ANTIVERT) 25 MG tablet Take 1 tablet (25 mg total) by mouth 3 (three) times daily as needed for dizziness. 30 tablet 2   Misc Natural Products (OSTEO BI-FLEX ADV JOINT SHIELD PO) Take 2 capsules by mouth daily.     Multiple Vitamin (MULTIVITAMIN) tablet Take 1 tablet by mouth daily.     ondansetron (ZOFRAN) 4 MG tablet Take 1 tablet (4 mg total) by mouth every 8 (eight) hours as needed for nausea or vomiting. 20 tablet 1   tamsulosin (FLOMAX) 0.4 MG CAPS capsule TAKE 1 CAPSULE BY MOUTH EVERY DAY 90 capsule 0   No current facility-administered medications on file prior to visit.    BP 104/60   Pulse 78   Temp 98.4 F (36.9 C) (Oral)   Ht 5' 9.5" (1.765 m)   Wt 168 lb (76.2 kg)   SpO2 95%   BMI 24.45 kg/m       Objective:   Physical Exam Vitals and nursing note reviewed.  Constitutional:      Appearance: Normal appearance.  HENT:     Right Ear: Tympanic membrane, ear canal and external ear normal. There is impacted  cerumen. Tympanic membrane is not erythematous.     Left Ear: Tympanic membrane, ear canal and external ear normal. There is impacted cerumen. Tympanic membrane is not erythematous.     Nose: Congestion and rhinorrhea present. Rhinorrhea is purulent and bloody.     Right Turbinates: Enlarged and swollen.     Left Turbinates: Enlarged and swollen.     Right Sinus: Maxillary sinus tenderness present.     Left Sinus: Maxillary sinus tenderness present.     Mouth/Throat:     Mouth: Mucous membranes are moist.     Pharynx: Oropharynx is clear.  Cardiovascular:     Rate and Rhythm: Normal rate and regular rhythm.     Pulses: Normal pulses.     Heart sounds: Normal heart sounds.  Pulmonary:     Effort: Pulmonary effort is normal.     Breath sounds: Normal breath sounds.  Musculoskeletal:        General: Normal range of motion.  Skin:    General: Skin is warm and dry.  Neurological:     General: No focal deficit present.     Mental Status: He is alert and oriented to person, place, and time.  Psychiatric:        Mood and Affect: Mood normal.        Behavior: Behavior normal.        Thought Content: Thought content normal.       Assessment & Plan:  1. Acute recurrent maxillary sinusitis -Treat with Augmentin twice daily x10 days.  Advise follow-up if not resolved.  At this time would consider imaging and referral to ear nose and throat - amoxicillin-clavulanate (AUGMENTIN) 875-125 MG tablet; Take 1 tablet by mouth 2 (two) times daily.  Dispense: 20 tablet; Refill: 0  2. Anxiety  - DULoxetine (CYMBALTA) 30 MG capsule; Take 1 capsule (30 mg  total) by mouth daily.  Dispense: 90 capsule; Refill: 0  3. Primary osteoarthritis involving multiple joints  - DULoxetine (CYMBALTA) 30 MG capsule; Take 1 capsule (30 mg total) by mouth daily.  Dispense: 90 capsule; Refill: 0  Dorothyann Peng, NP

## 2022-01-14 ENCOUNTER — Encounter: Payer: Self-pay | Admitting: Adult Health

## 2022-01-14 ENCOUNTER — Ambulatory Visit (INDEPENDENT_AMBULATORY_CARE_PROVIDER_SITE_OTHER): Payer: Medicare Other | Admitting: Adult Health

## 2022-01-14 VITALS — BP 122/80 | Temp 97.7°F | Wt 170.2 lb

## 2022-01-14 DIAGNOSIS — F419 Anxiety disorder, unspecified: Secondary | ICD-10-CM | POA: Diagnosis not present

## 2022-01-14 DIAGNOSIS — M159 Polyosteoarthritis, unspecified: Secondary | ICD-10-CM | POA: Diagnosis not present

## 2022-01-14 DIAGNOSIS — N400 Enlarged prostate without lower urinary tract symptoms: Secondary | ICD-10-CM | POA: Diagnosis not present

## 2022-01-14 MED ORDER — TAMSULOSIN HCL 0.4 MG PO CAPS: 0.4000 mg | ORAL_CAPSULE | Freq: Every day | ORAL | 3 refills | Status: DC

## 2022-01-14 MED ORDER — BUPROPION HCL ER (XL) 150 MG PO TB24: 150.0000 mg | ORAL_TABLET | Freq: Every day | ORAL | 0 refills | Status: DC

## 2022-01-14 MED ORDER — NAPROXEN 500 MG PO TABS: 500.0000 mg | ORAL_TABLET | Freq: Two times a day (BID) | ORAL | 0 refills | Status: DC

## 2022-01-14 NOTE — Progress Notes (Signed)
Subjective:    Patient ID: [name], male    DOB: May 14, 1940, 81 y.o.   MRN: [id number]  Hypertension   81 year old male who  has a past medical history of Arthritis, Brain tumor ([place]), Colonic mass (2001), Fatigue, Fibromyalgia (1990's ), Frequent PVCs, Generalized body aches, H/O inguinal hernia, Hernia, umbilical, History of kidney stones, Hypertension, NSVT (nonsustained ventricular tachycardia) ([place]), Premature atrial contractions, Prolonged Q-T interval on ECG, and Wears dentures.  He presents to the office today today for follow up regarding osteoarthritic pain and anxiety   During his last visit he was getting relief from Cymbalta on his symptoms but it was making him sleepy.  We decided to have him try taking his dosing in the early evening. Today he reports that although his symptoms are better he continues to be pretty fatigued in the in morning. The morning is the worse with his osteoarthritic pain.   He would like to come off Cymbalta and try something else.   In the past he has tried Lyrica, Gabapentin, and Mobic in the past and try something   He also needs a refill of Flomax for BPH   Review of Systems See HPI   Past Medical History:  Diagnosis Date   Arthritis    HANDS AND PT STATES HIS ARMS HURT   Brain tumor [facility])    age 61 - brain tumor removed left- benign - occas slight headaches since the surgery   Colonic mass 2001   Fatigue    x 1 week o as of 02-17-2020   Fibromyalgia 1990's    Frequent PVCs    Generalized body aches    x 1 week as of 02-17-2020   H/O inguinal hernia    right side   Hernia, umbilical    History of kidney stones    HX OF MULTIPLE KIDNEY STONES AND  CURRENTLY HAS BILATERAL RENAL STONES CAUSING ABDOMINAL AND BACK PAIN   Hypertension    high blood pressure readings    NSVT (nonsustained ventricular tachycardia) ([facility])    Premature atrial contractions    Prolonged Q-T interval on ECG    hx of none currently   Wears dentures     full dentures    Social History   Socioeconomic History   Marital status: Single    Spouse name: Not on file   Number of children: Not on file   Years of education: Not on file   Highest education level: Not on file  Occupational History   Not on file  Tobacco Use   Smoking status: Never   Smokeless tobacco: Never  Vaping Use   Vaping Use: Never used  Substance and Sexual Activity   Alcohol use: Yes    Alcohol/week: 7.0 standard drinks of alcohol    Types: 7 Standard drinks or equivalent per week    Comment: 1 drink per day   Drug use: Not Currently   Sexual activity: Not on file  Other Topics Concern   Not on file  Social History Narrative   Retired from Museum/gallery curator    Has one son - lives local    Likes to play Tennis   Social Determinants of Health   Financial Resource Strain: [place]  (05/06/2021)   Overall Financial Resource Strain (CARDIA)    Difficulty of Paying Living Expenses: Not hard at all  Food Insecurity: No Food Insecurity (05/06/2021)   Hunger Vital Sign    Worried About Running Out of Food  in the Last Year: Never true    [place] in the Last Year: Never true  Transportation Needs: No Transportation Needs (05/06/2021)   PRAPARE - Hydrologist (Medical): No    Lack of Transportation (Non-Medical): No  Physical Activity: Insufficiently Active (05/06/2021)   Exercise Vital Sign    Days of Exercise per Week: 1 day    Minutes of Exercise per Session: 90 min  Stress: No Stress Concern Present (05/06/2021)   [place]    Feeling of Stress : Not at all  Social Connections: Moderately Isolated (05/06/2021)   Social Connection and Isolation Panel [NHANES]    Frequency of Communication with Friends and Family: More than three times a week    Frequency of Social Gatherings with Friends and Family: More than three times a week    Attends Religious Services:  Never    Marine scientist or Organizations: Yes    Attends Music therapist: More than 4 times per year    Marital Status: Never married  Intimate Partner Violence: Not At Risk (05/06/2021)   Humiliation, Afraid, Rape, and Kick questionnaire    Fear of Current or Ex-Partner: No    Emotionally Abused: No    Physically Abused: No    Sexually Abused: No    Past Surgical History:  Procedure Laterality Date   BRAIN TUMOR EXCISION  age 41   CARDIAC CATHETERIZATION N/A 04/09/2015   Procedure: Left Heart Cath and Coronary Angiography;  Surgeon: Burnell Blanks, MD;  Location: [facility];  Service: Cardiovascular;  Laterality: N/A;   COLON SURGERY  2001   COLON RESECTION FOR GROWTH - NOT CANCER   CYSTOSCOPY WITH RETROGRADE PYELOGRAM, URETEROSCOPY AND STENT PLACEMENT Left 10/05/2012   Procedure: CYSTOSCOPY WITH RETROGRADE PYELOGRAM, URETEROSCOPY AND STENT PLACEMENT;  Surgeon: Alexis Frock, MD;  [facility]: [facility];  Service: Urology;  Laterality: Left;   CYSTOSCOPY/RETROGRADE/URETEROSCOPY/STONE EXTRACTION WITH BASKET Right 10/05/2012   Procedure: CYSTOSCOPY/RETROGRADE/URETEROSCOPY/STONE EXTRACTION WITH BASKET;  Surgeon: Alexis Frock, MD;  [facility];  Service: Urology;  Laterality: Right;   CYSTOSCOPY/RETROGRADE/URETEROSCOPY/STONE EXTRACTION WITH BASKET Bilateral 10/24/2012   Procedure: CYSTOSCOPY/RETROGRADE/URETEROSCOPY/STONE EXTRACTION WITH BASKET;  Surgeon: Alexis Frock, MD;  [facility];  Service: Urology;  Laterality: Bilateral;  with bilateral stent placement   HOLMIUM LASER APPLICATION Right 2/58/5277   Procedure: HOLMIUM LASER APPLICATION;  Surgeon: Alexis Frock, MD;  [facility];  Service: Urology;  Laterality: Right;   HOLMIUM LASER APPLICATION Bilateral 10/01/2351   Procedure: HOLMIUM LASER APPLICATION;  Surgeon: Alexis Frock, MD;  [facility];  Service: Urology;  Laterality: Bilateral;   LITHOTRIPSY     TONSILLECTOMY  age 22's    and adenoids    Family History  Problem Relation Age of Onset   Colon cancer Father    Heart disease Father        quad bypass    Allergies  Allergen Reactions   Other Other (See Comments)    Mangos - caused a severe rash   Prozac [Fluoxetine Hcl]     Made him feel "loopy"   Sulfa Antibiotics Hives   Vicodin [Hydrocodone-Acetaminophen] Itching    Tolerates oxycodone   Erythromycin Rash    Many years ago an oral antibiotic caused a rash(wife and patient think it was erythromycin but they are not positive).    Current Outpatient Medications on File Prior to Visit  Medication Sig Dispense Refill  aspirin EC 81 MG tablet Take 1 tablet (81 mg total) by mouth daily. 90 tablet 3   atorvastatin (LIPITOR) 10 MG tablet Take 1 tablet (10 mg total) by mouth daily. 90 tablet 2   carvedilol (COREG) 3.125 MG tablet Take 1 tablet (3.125 mg total) by mouth 2 (two) times daily. 180 tablet 1   Cranberry 1000 MG CAPS Take 1,000 mg by mouth daily.      DULoxetine (CYMBALTA) 30 MG capsule Take 1 capsule (30 mg total) by mouth daily. 90 capsule 0   ibuprofen (ADVIL,MOTRIN) 200 MG tablet Take 600 mg by mouth 2 (two) times daily.      lisinopril (ZESTRIL) 10 MG tablet Take 1 tablet (10 mg total) by mouth daily. 90 tablet 3   meclizine (ANTIVERT) 25 MG tablet Take 1 tablet (25 mg total) by mouth 3 (three) times daily as needed for dizziness. 30 tablet 2   Misc Natural Products (OSTEO BI-FLEX ADV JOINT SHIELD PO) Take 2 capsules by mouth daily.     Multiple Vitamin (MULTIVITAMIN) tablet Take 1 tablet by mouth daily.     ondansetron (ZOFRAN) 4 MG tablet Take 1 tablet (4 mg total) by mouth every 8 (eight) hours as needed for nausea or vomiting. 20 tablet 1   ondansetron (ZOFRAN-ODT) 4 MG disintegrating tablet Take 1 tablet (4 mg total) by mouth every 8 (eight) hours as needed for nausea or vomiting. 20 tablet 1   tamsulosin (FLOMAX) 0.4 MG CAPS capsule TAKE 1 CAPSULE BY MOUTH EVERY DAY 90 capsule 0   No  current facility-administered medications on file prior to visit.    BP 122/80   Temp 97.7 F (36.5 C)   Wt 170 lb 3.2 oz (77.2 kg)   BMI 24.77 kg/m       Objective:   Physical Exam Vitals and nursing note reviewed.  Constitutional:      Appearance: Normal appearance.  Cardiovascular:     Rate and Rhythm: Normal rate and regular rhythm.     Pulses: Normal pulses.     Heart sounds: Normal heart sounds.  Pulmonary:     Effort: Pulmonary effort is normal.     Breath sounds: Normal breath sounds.  Musculoskeletal:        General: Normal range of motion.  Skin:    General: Skin is warm and dry.     Capillary Refill: Capillary refill takes less than 2 seconds.  Neurological:     General: No focal deficit present.     Mental Status: He is oriented to person, place, and time.       Assessment & Plan:  1. Anxiety - Will trial him on Wellbutrin  - Follow up in 30 days  - buPROPion (WELLBUTRIN XL) 150 MG 24 hr tablet; Take 1 tablet (150 mg total) by mouth daily.  Dispense: 90 tablet; Refill: 0  2. Primary osteoarthritis involving multiple joints - Trial him on Naprosyn  - Follow up in 30 days  - naproxen (NAPROSYN) 500 MG tablet; Take 1 tablet (500 mg total) by mouth 2 (two) times daily with a meal.  Dispense: 180 tablet; Refill: 0  3. Benign prostatic hyperplasia without lower urinary tract symptoms  - tamsulosin (FLOMAX) 0.4 MG CAPS capsule; Take 1 capsule (0.4 mg total) by mouth daily.  Dispense: 90 capsule; Refill: [place], NP  Time spent with patient today was 31 minutes which consisted of chart review, discussing anxiety and osteoarthritis. work up, treatment answering questions and  documentation.

## 2022-02-15 ENCOUNTER — Ambulatory Visit (INDEPENDENT_AMBULATORY_CARE_PROVIDER_SITE_OTHER): Payer: Medicare Other

## 2022-02-15 ENCOUNTER — Encounter: Payer: Self-pay | Admitting: Adult Health

## 2022-02-15 ENCOUNTER — Ambulatory Visit (INDEPENDENT_AMBULATORY_CARE_PROVIDER_SITE_OTHER): Payer: Medicare Other | Admitting: Adult Health

## 2022-02-15 VITALS — BP 122/78 | HR 66 | Temp 97.5°F | Ht 69.5 in | Wt 172.0 lb

## 2022-02-15 DIAGNOSIS — G8929 Other chronic pain: Secondary | ICD-10-CM | POA: Diagnosis not present

## 2022-02-15 DIAGNOSIS — F419 Anxiety disorder, unspecified: Secondary | ICD-10-CM

## 2022-02-15 DIAGNOSIS — M25561 Pain in right knee: Secondary | ICD-10-CM | POA: Diagnosis not present

## 2022-02-15 DIAGNOSIS — M25562 Pain in left knee: Secondary | ICD-10-CM | POA: Diagnosis not present

## 2022-02-15 MED ORDER — IBUPROFEN 600 MG PO TABS: 600.0000 mg | ORAL_TABLET | Freq: Three times a day (TID) | ORAL | 3 refills | Status: AC

## 2022-02-15 NOTE — Progress Notes (Addendum)
Subjective:    Patient ID: [name], male    DOB: Jul 09, 1940, 82 y.o.   MRN: [id number]  HPI  82 year old male who  has a past medical history of Arthritis, Brain tumor ([place]), Colonic mass (2001), Fatigue, Fibromyalgia (1990's ), Frequent PVCs, Generalized body aches, H/O inguinal hernia, Hernia, umbilical, History of kidney stones, Hypertension, NSVT (nonsustained ventricular tachycardia) ([place]), Premature atrial contractions, Prolonged Q-T interval on ECG, and Wears dentures.  He presents to the office today for follow-up regarding anxiety.  When he was seen roughly a month ago he wanted to come off Cymbalta for osteoarthritic pain and generalized anxiety disorder due to it making him sleepy.  We decided to trial him on Wellbutrin 150 mg extended release daily for his anxiety symptoms.   For his arthritic pain he was prescribed Naprosyn  500 mg BID   Today he reports that anxiety has improved and he is tolerating the medication well.   He does feel as though the naprosyn helps to some degree but does not last as long as he would hope. Feels as though 600 mg motrin helps more than naprosyn.    Review of Systems See HPI   Past Medical History:  Diagnosis Date   Arthritis    HANDS AND PT STATES HIS ARMS HURT   Brain tumor [facility])    age 32 - brain tumor removed left- benign - occas slight headaches since the surgery   Colonic mass 2001   Fatigue    x 1 week o as of 02-17-2020   Fibromyalgia 1990's    Frequent PVCs    Generalized body aches    x 1 week as of 02-17-2020   H/O inguinal hernia    right side   Hernia, umbilical    History of kidney stones    HX OF MULTIPLE KIDNEY STONES AND  CURRENTLY HAS BILATERAL RENAL STONES CAUSING ABDOMINAL AND BACK PAIN   Hypertension    high blood pressure readings    NSVT (nonsustained ventricular tachycardia) ([facility])    Premature atrial contractions    Prolonged Q-T interval on ECG    hx of none currently   Wears dentures    full  dentures    Social History   Socioeconomic History   Marital status: Single    Spouse name: Not on file   Number of children: Not on file   Years of education: Not on file   Highest education level: Not on file  Occupational History   Not on file  Tobacco Use   Smoking status: Never   Smokeless tobacco: Never  Vaping Use   Vaping Use: Never used  Substance and Sexual Activity   Alcohol use: Yes    Alcohol/week: 7.0 standard drinks of alcohol    Types: 7 Standard drinks or equivalent per week    Comment: 1 drink per day   Drug use: Not Currently   Sexual activity: Not on file  Other Topics Concern   Not on file  Social History Narrative   Retired from Museum/gallery curator    Has one son - lives local    Likes to play Tennis   Social Determinants of Health   Financial Resource Strain: [place]  (05/06/2021)   Overall Financial Resource Strain (CARDIA)    Difficulty of Paying Living Expenses: Not hard at all  Food Insecurity: No Food Insecurity (05/06/2021)   Hunger Vital Sign    Worried About Running Out of Food in the  Last Year: Never true    [place] in the Last Year: Never true  Transportation Needs: No Transportation Needs (05/06/2021)   PRAPARE - Hydrologist (Medical): No    Lack of Transportation (Non-Medical): No  Physical Activity: Insufficiently Active (05/06/2021)   Exercise Vital Sign    Days of Exercise per Week: 1 day    Minutes of Exercise per Session: 90 min  Stress: No Stress Concern Present (05/06/2021)   [place]    Feeling of Stress : Not at all  Social Connections: Moderately Isolated (05/06/2021)   Social Connection and Isolation Panel [NHANES]    Frequency of Communication with Friends and Family: More than three times a week    Frequency of Social Gatherings with Friends and Family: More than three times a week    Attends Religious Services: Never     Marine scientist or Organizations: Yes    Attends Music therapist: More than 4 times per year    Marital Status: Never married  Intimate Partner Violence: Not At Risk (05/06/2021)   Humiliation, Afraid, Rape, and Kick questionnaire    Fear of Current or Ex-Partner: No    Emotionally Abused: No    Physically Abused: No    Sexually Abused: No    Past Surgical History:  Procedure Laterality Date   BRAIN TUMOR EXCISION  age 89   CARDIAC CATHETERIZATION N/A 04/09/2015   Procedure: Left Heart Cath and Coronary Angiography;  Surgeon: Burnell Blanks, MD;  Location: [facility];  Service: Cardiovascular;  Laterality: N/A;   COLON SURGERY  2001   COLON RESECTION FOR GROWTH - NOT CANCER   CYSTOSCOPY WITH RETROGRADE PYELOGRAM, URETEROSCOPY AND STENT PLACEMENT Left 10/05/2012   Procedure: CYSTOSCOPY WITH RETROGRADE PYELOGRAM, URETEROSCOPY AND STENT PLACEMENT;  Surgeon: Alexis Frock, MD;  [facility];  Service: Urology;  Laterality: Left;   CYSTOSCOPY/RETROGRADE/URETEROSCOPY/STONE EXTRACTION WITH BASKET Right 10/05/2012   Procedure: CYSTOSCOPY/RETROGRADE/URETEROSCOPY/STONE EXTRACTION WITH BASKET;  Surgeon: Alexis Frock, MD;  [facility];  Service: Urology;  Laterality: Right;   CYSTOSCOPY/RETROGRADE/URETEROSCOPY/STONE EXTRACTION WITH BASKET Bilateral 10/24/2012   Procedure: CYSTOSCOPY/RETROGRADE/URETEROSCOPY/STONE EXTRACTION WITH BASKET;  Surgeon: Alexis Frock, MD;  [facility];  Service: Urology;  Laterality: Bilateral;  with bilateral stent placement   HOLMIUM LASER APPLICATION Right 0/73/7106   Procedure: HOLMIUM LASER APPLICATION;  Surgeon: Alexis Frock, MD;  [facility];  Service: Urology;  Laterality: Right;   HOLMIUM LASER APPLICATION Bilateral 2/69/4854   Procedure: HOLMIUM LASER APPLICATION;  Surgeon: Alexis Frock, MD;  [facility];  Service: Urology;  Laterality: Bilateral;   LITHOTRIPSY     TONSILLECTOMY  age 6's   and  adenoids    Family History  Problem Relation Age of Onset   Colon cancer Father    Heart disease Father        quad bypass    Allergies  Allergen Reactions   Other Other (See Comments)    Mangos - caused a severe rash   Prozac [Fluoxetine Hcl]     Made him feel "loopy"   Sulfa Antibiotics Hives   Vicodin [Hydrocodone-Acetaminophen] Itching    Tolerates oxycodone   Erythromycin Rash    Many years ago an oral antibiotic caused a rash(wife and patient think it was erythromycin but they are not positive).    Current Outpatient Medications on File Prior to Visit  Medication Sig Dispense Refill  aspirin EC 81 MG tablet Take 1 tablet (81 mg total) by mouth daily. 90 tablet 3   atorvastatin (LIPITOR) 10 MG tablet Take 1 tablet (10 mg total) by mouth daily. 90 tablet 2   buPROPion (WELLBUTRIN XL) 150 MG 24 hr tablet Take 1 tablet (150 mg total) by mouth daily. 90 tablet 0   carvedilol (COREG) 3.125 MG tablet Take 1 tablet (3.125 mg total) by mouth 2 (two) times daily. 180 tablet 1   Cranberry 1000 MG CAPS Take 1,000 mg by mouth daily.      ibuprofen (ADVIL,MOTRIN) 200 MG tablet Take 600 mg by mouth 2 (two) times daily.      lisinopril (ZESTRIL) 10 MG tablet Take 1 tablet (10 mg total) by mouth daily. 90 tablet 3   meclizine (ANTIVERT) 25 MG tablet Take 1 tablet (25 mg total) by mouth 3 (three) times daily as needed for dizziness. 30 tablet 2   Misc Natural Products (OSTEO BI-FLEX ADV JOINT SHIELD PO) Take 2 capsules by mouth daily.     Multiple Vitamin (MULTIVITAMIN) tablet Take 1 tablet by mouth daily.     naproxen (NAPROSYN) 500 MG tablet Take 1 tablet (500 mg total) by mouth 2 (two) times daily with a meal. 180 tablet 0   ondansetron (ZOFRAN) 4 MG tablet Take 1 tablet (4 mg total) by mouth every 8 (eight) hours as needed for nausea or vomiting. 20 tablet 1   ondansetron (ZOFRAN-ODT) 4 MG disintegrating tablet Take 1 tablet (4 mg total) by mouth every 8 (eight) hours as needed for  nausea or vomiting. 20 tablet 1   tamsulosin (FLOMAX) 0.4 MG CAPS capsule Take 1 capsule (0.4 mg total) by mouth daily. 90 capsule 3   No current facility-administered medications on file prior to visit.    There were no vitals taken for this visit.      Objective:   Physical Exam Constitutional:      Appearance: Normal appearance.  Cardiovascular:     Rate and Rhythm: Normal rate and regular rhythm.     Pulses: Normal pulses.     Heart sounds: Normal heart sounds.  Pulmonary:     Effort: Pulmonary effort is normal.     Breath sounds: Normal breath sounds.  Neurological:     General: No focal deficit present.     Mental Status: He is oriented to person, place, and time.  Psychiatric:        Mood and Affect: Mood normal.        Behavior: Behavior normal.        Thought Content: Thought content normal.        Judgment: Judgment normal.        Assessment & Plan:  1. Anxiety - Continue with Wellbutrin    2. Chronic pain of right knee  - ibuprofen (ADVIL) 600 MG tablet; Take 1 tablet (600 mg total) by mouth every 8 (eight) hours.  Dispense: 270 tablet; Refill: 3 - DG Knee 1-2 Views Right; Future  3. Chronic pain of left knee  - ibuprofen (ADVIL) 600 MG tablet; Take 1 tablet (600 mg total) by mouth every 8 (eight) hours.  Dispense: 270 tablet; Refill: 3 - DG Knee 1-2 Views Left; Future  Dorothyann Peng, NP  Time spent with patient today was 31 minutes which consisted of chart review, discussing diagnosis, work up, treatment answering questions, listening,  and documentation.

## 2022-03-10 ENCOUNTER — Other Ambulatory Visit: Payer: Self-pay | Admitting: Adult Health

## 2022-03-10 DIAGNOSIS — I10 Essential (primary) hypertension: Secondary | ICD-10-CM

## 2022-04-11 ENCOUNTER — Other Ambulatory Visit: Payer: Self-pay | Admitting: Adult Health

## 2022-04-11 DIAGNOSIS — F419 Anxiety disorder, unspecified: Secondary | ICD-10-CM

## 2022-04-11 DIAGNOSIS — M159 Polyosteoarthritis, unspecified: Secondary | ICD-10-CM

## 2022-04-13 NOTE — Telephone Encounter (Signed)
  The original prescription was discontinued on 02/15/2022 by Dorothyann Peng, NP. Renewing this prescription may not be appropriate.

## 2022-05-10 ENCOUNTER — Telehealth (INDEPENDENT_AMBULATORY_CARE_PROVIDER_SITE_OTHER): Payer: Medicare Other | Admitting: Family Medicine

## 2022-05-10 DIAGNOSIS — Z0000 Encounter for general adult medical examination without abnormal findings: Secondary | ICD-10-CM | POA: Diagnosis not present

## 2022-05-10 NOTE — Patient Instructions (Addendum)
I really enjoyed getting to talk with you today! I am available on Tuesdays and Thursdays for virtual visits if you have any questions or concerns, or if I can be of any further assistance.   CHECKLIST FROM ANNUAL WELLNESS VISIT:  -Follow up (please call to schedule if not scheduled after visit):   -yearly for annual wellness visit with primary care office  Here is a list of your preventive care/health maintenance measures and the plan for each if any are due:  Health Maintenance  Topic Date Due   DTaP/Tdap/Td (1 - Tdap) Never done   Zoster Vaccines- Shingrix (1 of 2) Never done   COVID-19 Vaccine (2 - 2023-24 season) 10/08/2021   INFLUENZA VACCINE  09/08/2022   Medicare Annual Wellness (AWV)  05/10/2023   Pneumonia Vaccine 17+ Years old  Completed   HPV VACCINES  Aged Out    -See a dentist at least yearly  -Get your eyes checked and then per your eye specialist's recommendations  -Other issues addressed today:   -I have included below further information regarding a healthy whole foods based diet, physical activity guidelines for adults, stress management and opportunities for social connections. I hope you find this information useful.   -----------------------------------------------------------------------------------------------------------------------------------------------------------------------------------------------------------------------------------------------------------  NUTRITION: -eat real food: lots of colorful vegetables (half the plate) and fruits -5-7 servings of vegetables and fruits per day (fresh or steamed is best), exp. 2 servings of vegetables with lunch and dinner and 2 servings of fruit per day. Berries and greens such as kale and collards are great choices.  -consume on a regular basis: whole grains (make sure first ingredient on label contains the word "whole"), fresh fruits, fish, nuts, seeds, healthy oils (such as olive oil, avocado oil, grape  seed oil) -may eat small amounts of dairy and lean meat on occasion, but avoid processed meats such as ham, bacon, lunch meat, etc. -drink water -try to avoid fast food and pre-packaged foods, processed meat -most experts advise limiting sodium to < 2300mg  per day, should limit further is any chronic conditions such as high blood pressure, heart disease, diabetes, etc. The American Heart Association advised that < 1500mg  is is ideal -try to avoid foods that contain any ingredients with names you do not recognize  -try to avoid sugar/sweets (except for the natural sugar that occurs in fresh fruit) -try to avoid sweet drinks -try to avoid white rice, white bread, pasta (unless whole grain), white or yellow potatoes  EXERCISE GUIDELINES FOR ADULTS: -if you wish to increase your physical activity, do so gradually and with the approval of your doctor -STOP and seek medical care immediately if you have any chest pain, chest discomfort or trouble breathing when starting or increasing exercise  -move and stretch your body, legs, feet and arms when sitting for long periods -Physical activity guidelines for optimal health in adults: -least 150 minutes per week of aerobic exercise (can talk, but not sing) once approved by your doctor, 20-30 minutes of sustained activity or two 10 minute episodes of sustained activity every day.  -resistance training at least 2 days per week if approved by your doctor -balance exercises 3+ days per week:   Stand somewhere where you have something sturdy to hold onto if you lose balance.    1) lift up on toes, start with 5x per day and work up to 20x   2) stand and lift on leg straight out to the side so that foot is a few inches of the floor, start  with 5x each side and work up to 20x each side   3) stand on one foot, start with 5 seconds each side and work up to 20 seconds on each side  If you need ideas or help with getting more active:  -Silver  sneakers https://tools.silversneakers.com  -Walk with a Doc: http://stephens-thompson.biz/  -try to include resistance (weight lifting/strength building) and balance exercises twice per week: or the following link for ideas: ChessContest.fr  UpdateClothing.com.cy  STRESS MANAGEMENT: -can try meditating, or just sitting quietly with deep breathing while intentionally relaxing all parts of your body for 5 minutes daily -if you need further help with stress, anxiety or depression please follow up with your primary doctor or contact the wonderful folks at [place]: [place]: -options in [place] if you wish to engage in more social and exercise related activities:  -Silver sneakers https://tools.silversneakers.com  -Walk with a Doc: http://stephens-thompson.biz/  -Check out the [place] 50+ section on the [place] [employer] (hiking clubs, book clubs, cards and games, chess, exercise classes, aquatic classes and much more) - see the website for details: https://www.[place].gov/departments/parks-recreation/active-adults50  -YouTube has lots of exercise videos for different ages and abilities as well  -[place] (a variety of indoor and outdoor inperson activities for adults). ([phone] [phone]. [address].  -Virtual Online Classes (a variety of topics): see seniorplanet.org or call (424)432-1627  -consider volunteering at a school, hospice center, church, senior center or elsewhere

## 2022-05-10 NOTE — Progress Notes (Signed)
PATIENT CHECK-IN and HEALTH RISK ASSESSMENT QUESTIONNAIRE:  -completed by phone/video for upcoming Medicare Preventive Visit  Pre-Visit Check-in: 1)Vitals (height, wt, BP, etc) - record in vitals section for visit on day of visit 2)Review and Update Medications, Allergies PMH, Surgeries, Social history in Epic 3)Hospitalizations in the last year with date/reason? No   4)Review and Update Care Team (patient's specialists) in Epic 5) Complete PHQ9 in Epic  6) Complete Fall Screening in Epic 7)Review all Health Maintenance Due and order under PCP if not done.  Medicare Wellness Patient Questionnaire:  Answer theses question about your habits: Do you drink alcohol? No    If yes, how many drinks do you have a day?No  Have you ever smoked?No     Quit date if applicable?  N/A How many packs a day do/did you smoke? N/A Do you use smokeless tobacco?No Do you use an illicit drugs?No Do you exercises? 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 No    Number of partners?None Does try to eat healthy, eats red meat rarely Typical breakfast: egg,toast Typical lunch: half sandwich Typical dinner:chicken, fish,vegetables, starch  Typical snacks:cheese and crackers, potato chip  Beverages: water, gatorade, 1 coke per day   Answer theses question about you: Can you perform most household chores?Yes  Do you find it hard to follow a conversation in a noisy room?No  Do you often ask people to speak up or repeat themselves?No  Do you feel that you have a problem with memory?No  Do you balance your checkbook and or bank acounts?Yes  Do you feel safe at home?Yes  Last dentist visit?patient has dentures Do you need assistance with any of the following: Please note if so   Driving?-No   Feeding yourself?-No   Getting from bed to  chair?-no   Getting to the toilet?-No   Bathing or showering?-No   Dressing yourself?No   Managing money?-No   Climbing a flight of stairs-No   Preparing meals?-No     Do you have Advanced Directives in place (Living Will, Healthcare Power or Attorney)? Yes, patient has a Will   Last eye Exam and location?few years ago at [facility] in [place] - goes every few years   Do you currently use prescribed or non-prescribed narcotic or opioid pain medications?No, only advil.  Do you have a history or close family history of breast, ovarian, tubal or peritoneal cancer or a family member with BRCA (breast cancer susceptibility 1 and 2) gene mutations?  Nurse/Assistant Credentials/time stamp: [facility]    ----------------------------------------------------------------------------------------------------------------------------------------------------------------------------------------------------------------------    [place] (Welcome to [employer], initial annual wellness or annual wellness exam)  Virtual Visit via Phone/Video Note  I connected with KENDYN PALER on 05/10/22  by a video enabled telemedicine application and verified that I am speaking with the correct person using two identifiers.  Location patient: home Location provider:work or home office Persons participating in the virtual visit: patient, provider  Concerns and/or follow up today: reports stable, no concerns.    See HM section in Epic for other details of completed HM.    ROS: negative for report of fevers, unintentional weight loss, vision changes, vision loss, hearing loss or change, chest pain, sob, hemoptysis, melena, hematochezia, hematuria, falls, bleeding or bruising, loc, thoughts of suicide or  self harm, memory loss  Patient-completed extensive health risk assessment - reviewed and discussed with the patient: See Health Risk Assessment completed with  patient prior to the visit either above or in recent phone note. This was reviewed in detailed with the patient today and appropriate recommendations, orders and referrals were placed as needed per Summary below and patient instructions.   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 - occas slight headaches since the surgery   Colonic mass 2001   Fatigue    x 1 week o as of 02-17-2020   Fibromyalgia 1990's    Frequent PVCs    Generalized body aches    x 1 week as of 02-17-2020   H/O inguinal hernia    right side   Hernia, umbilical    History of kidney stones    HX OF MULTIPLE KIDNEY STONES AND  CURRENTLY HAS BILATERAL RENAL STONES CAUSING ABDOMINAL AND BACK PAIN   Hypertension    high blood pressure readings    NSVT (nonsustained ventricular tachycardia) (HCC)    Premature atrial contractions    Prolonged Q-T interval on ECG    hx of none currently   Wears dentures    full dentures    Past Surgical History:  Procedure Laterality Date   BRAIN TUMOR EXCISION  age 15   CARDIAC CATHETERIZATION N/A 04/09/2015   Procedure: Left Heart Cath and Coronary Angiography;  Surgeon: Burnell Blanks, MD;  Location: [facility];  Service: Cardiovascular;  Laterality: N/A;   COLON SURGERY  2001   COLON RESECTION FOR GROWTH - NOT CANCER   CYSTOSCOPY WITH RETROGRADE PYELOGRAM, URETEROSCOPY AND STENT PLACEMENT Left 10/05/2012   Procedure: CYSTOSCOPY WITH RETROGRADE PYELOGRAM, URETEROSCOPY AND STENT PLACEMENT;  Surgeon: Alexis Frock, MD;  Location: [facility];  Service: Urology;  Laterality: Left;    CYSTOSCOPY/RETROGRADE/URETEROSCOPY/STONE EXTRACTION WITH BASKET Right 10/05/2012   Procedure: CYSTOSCOPY/RETROGRADE/URETEROSCOPY/STONE EXTRACTION WITH BASKET;  Surgeon: Alexis Frock, MD;  Location: [facility];  Service: Urology;  Laterality: Right;   CYSTOSCOPY/RETROGRADE/URETEROSCOPY/STONE EXTRACTION WITH BASKET Bilateral 10/24/2012   Procedure: CYSTOSCOPY/RETROGRADE/URETEROSCOPY/STONE EXTRACTION WITH BASKET;  Surgeon: Alexis Frock, MD;  Location: [facility];  Service: Urology;  Laterality: Bilateral;  with bilateral stent placement   HOLMIUM LASER APPLICATION Right [id number]   Procedure: HOLMIUM LASER APPLICATION;  Surgeon: Alexis Frock, MD;  Location: [facility];  Service: Urology;  Laterality: Right;   HOLMIUM LASER APPLICATION Bilateral [id number]   Procedure: HOLMIUM LASER APPLICATION;  Surgeon: Alexis Frock, MD;  Location: [facility];  Service: Urology;  Laterality: Bilateral;   LITHOTRIPSY     TONSILLECTOMY  age 32's   and adenoids    Social History   Socioeconomic History   Marital status: Single    Spouse name: Not on file   Number of children: Not on file   Years of education: Not on file   Highest education level: Not on file  Occupational History   Not on file  Tobacco Use   Smoking status: Never   Smokeless tobacco: Never  Vaping Use   Vaping Use: Never used  Substance and  Sexual Activity   Alcohol use: Yes    Alcohol/week: 7.0 standard drinks of alcohol    Types: 7 Standard drinks or equivalent per week    Comment: 1 drink per day   Drug use: Not Currently   Sexual activity: Not on file  Other Topics Concern   Not on file  Social History Narrative   Retired from Museum/gallery curator    Has one son - lives local    Likes to play Tennis   Social Determinants of Health   Financial Resource Strain: [place]  (05/06/2021)   Overall Financial Resource Strain (CARDIA)    Difficulty of Paying Living Expenses: Not hard at all  Food Insecurity: No Food Insecurity (05/06/2021)   Hunger  Vital Sign    Worried About Running Out of Food in the Last Year: Never true    [place] in the Last Year: Never true  Transportation Needs: No Transportation Needs (05/06/2021)   PRAPARE - Hydrologist (Medical): No    Lack of Transportation (Non-Medical): No  Physical Activity: Insufficiently Active (05/06/2021)   Exercise Vital Sign    Days of Exercise per Week: 1 day    Minutes of Exercise per Session: 90 min  Stress: No Stress Concern Present (05/06/2021)   [place]    Feeling of Stress : Not at all  Social Connections: Moderately Isolated (05/06/2021)   Social Connection and Isolation Panel [NHANES]    Frequency of Communication with Friends and Family: More than three times a week    Frequency of Social Gatherings with Friends and Family: More than three times a week    Attends Religious Services: Never    Marine scientist or Organizations: Yes    Attends Music therapist: More than 4 times per year    Marital Status: Never married  Intimate Partner Violence: Not At Risk (05/06/2021)   Humiliation, Afraid, Rape, and Kick questionnaire    Fear of Current or Ex-Partner: No    Emotionally Abused: No    Physically Abused: No    Sexually Abused: No    Family History  Problem Relation Age of Onset   Colon cancer Father    Heart disease Father        quad bypass    Current Outpatient Medications on File Prior to Visit  Medication Sig Dispense Refill   aspirin EC 81 MG tablet Take 1 tablet (81 mg total) by mouth daily. 90 tablet 3   atorvastatin (LIPITOR) 10 MG tablet Take 1 tablet (10 mg total) by mouth daily. 90 tablet 2   buPROPion (WELLBUTRIN XL) 150 MG 24 hr tablet TAKE 1 TABLET BY MOUTH EVERY DAY 90 tablet 0   carvedilol (COREG) 3.125 MG tablet TAKE 1 TABLET BY MOUTH 2 TIMES DAILY. 180 tablet 1   Cranberry 1000 MG CAPS Take 1,000 mg by mouth daily.       ibuprofen (ADVIL) 600 MG tablet Take 1 tablet (600 mg total) by mouth every 8 (eight) hours. 270 tablet 3   lisinopril (ZESTRIL) 10 MG tablet Take 1 tablet (10 mg total) by mouth daily. 90 tablet 3   meclizine (ANTIVERT) 25 MG tablet Take 1 tablet (25 mg total) by mouth 3 (three) times daily as needed for dizziness. 30 tablet 2   Misc Natural Products (OSTEO BI-FLEX ADV JOINT SHIELD PO) Take 2 capsules by mouth daily.     Multiple  Vitamin (MULTIVITAMIN) tablet Take 1 tablet by mouth daily.     ondansetron (ZOFRAN) 4 MG tablet Take 1 tablet (4 mg total) by mouth every 8 (eight) hours as needed for nausea or vomiting. 20 tablet 1   ondansetron (ZOFRAN-ODT) 4 MG disintegrating tablet Take 1 tablet (4 mg total) by mouth every 8 (eight) hours as needed for nausea or vomiting. 20 tablet 1   tamsulosin (FLOMAX) 0.4 MG CAPS capsule Take 1 capsule (0.4 mg total) by mouth daily. 90 capsule 3   No current facility-administered medications on file prior to visit.    Allergies  Allergen Reactions   Other Other (See Comments)    [place] - caused a severe rash   Prozac [Fluoxetine Hcl]     Made him feel "loopy"   Sulfa Antibiotics Hives   Vicodin [Hydrocodone-Acetaminophen] Itching    Tolerates oxycodone   Erythromycin Rash    Many years ago an oral antibiotic caused a rash(wife and patient think it was erythromycin but they are not positive).       Physical Exam There were no vitals filed for this visit. Estimated body mass index is 25.04 kg/m as calculated from the following:   Height as of 02/15/22: 5' 9.5" (1.765 m).   Weight as of 02/15/22: 172 lb (78 kg).  EKG (optional): deferred due to virtual visit  GENERAL: alert, oriented, no acute distress detected; full vision exam deferred due to pandemic and/or virtual encounter  PSYCH/NEURO: pleasant and cooperative, no obvious depression or anxiety, speech and thought processing grossly intact, Cognitive function grossly intact  Flowsheet Row  Video Visit from 05/10/2022 in [facility]  PHQ-9 Total Score 3           05/10/2022   12:23 PM 05/10/2022   12:22 PM 05/06/2021   12:38 PM 05/04/2020   10:30 AM 05/04/2020    9:57 AM  Depression screen PHQ 2/9  Decreased Interest 0 0 0 0 0  Down, Depressed, Hopeless 0 0 0 0 0  PHQ - 2 Score 0 0 0 0 0  Altered sleeping 0      Tired, decreased energy 3      Change in appetite 0      Feeling bad or failure about yourself  0      Trouble concentrating 0      Moving slowly or fidgety/restless 0      Suicidal thoughts 0      PHQ-9 Score 3      Difficult doing work/chores Not difficult at all           03/05/2021   11:19 AM 05/06/2021   12:40 PM 05/19/2021   10:22 AM 10/21/2021    9:34 AM 05/10/2022   12:22 PM  Fall Risk  Falls in the past year?  0  0 0  Was there an injury with Fall?  0  0 0  Fall Risk Category Calculator  0  0 0  Fall Risk Category (Retired)  Low  Low   (RETIRED) Patient Fall Risk Level Low fall risk Low fall risk Low fall risk Low fall risk   Patient at Risk for Falls Due to  No Fall Risks  No Fall Risks   Fall risk Follow up    Falls evaluation completed      SUMMARY AND PLAN:  Encounter for Medicare annual wellness exam     Discussed applicable health maintenance/preventive health measures and advised and referred or ordered per  patient preferences: Discussed vaccines due and he reports he plans to get the covid ant tetanus shots. Advised to let us know when he does if gets elsewhere so that we can update. Health Maintenance  Topic Date Due   DTaP/Tdap/Td (1 - Tdap) Never done   Zoster Vaccines- Shingrix (1 of 2) Never done   COVID-19 Vaccine (2 - 2023-24 season) 10/08/2021   INFLUENZA VACCINE  09/08/2022   Medicare Annual Wellness (AWV)  05/10/2023   Pneumonia Vaccine 31+ Years old  Completed   HPV VACCINES  Aged Out    Education and counseling on the following was provided based on the above review of health and a  plan/checklist for the patient, along with additional information discussed, was provided for the patient in the patient instructions :   -Advised and counseled on maintaining healthy lifestyle - including the importance of a healthy diet, regular physical activity, social connections and stress management. -Advised and counseled on a whole foods based healthy diet and regular exercise. - A summary of a healthy diet was provided in the Patient Instructions.  -Reviewed exercise guidelines for adults and discussed resources for home and in the community - summarized in pt instructions.  -Advise yearly dental visits at minimum and regular eye exams   Follow up: see patient instructions   Patient Instructions  I really enjoyed getting to talk with you today! I am available on Tuesdays and Thursdays for virtual visits if you have any questions or concerns, or if I can be of any further assistance.   CHECKLIST FROM ANNUAL WELLNESS VISIT:  -Follow up (please call to schedule if not scheduled after visit):   -yearly for annual wellness visit with primary care office  Here is a list of your preventive care/health maintenance measures and the plan for each if any are due:  Health Maintenance  Topic Date Due   DTaP/Tdap/Td (1 - Tdap) Never done   Zoster Vaccines- Shingrix (1 of 2) Never done   COVID-19 Vaccine (2 - 2023-24 season) 10/08/2021   INFLUENZA VACCINE  09/08/2022   Medicare Annual Wellness (AWV)  05/10/2023   Pneumonia Vaccine 19+ Years old  Completed   HPV VACCINES  Aged Out    -See a dentist at least yearly  -Get your eyes checked and then per your eye specialist's recommendations  -Other issues addressed today:   -I have included below further information regarding a healthy whole foods based diet, physical activity guidelines for adults, stress management and opportunities for social connections. I hope you find this information useful.    -----------------------------------------------------------------------------------------------------------------------------------------------------------------------------------------------------------------------------------------------------------  NUTRITION: -eat real food: lots of colorful vegetables (half the plate) and fruits -5-7 servings of vegetables and fruits per day (fresh or steamed is best), exp. 2 servings of vegetables with lunch and dinner and 2 servings of fruit per day. Berries and greens such as kale and collards are great choices.  -consume on a regular basis: whole grains (make sure first ingredient on label contains the word "whole"), fresh fruits, fish, nuts, seeds, healthy oils (such as olive oil, avocado oil, grape seed oil) -may eat small amounts of dairy and lean meat on occasion, but avoid processed meats such as ham, bacon, lunch meat, etc. -drink water -try to avoid fast food and pre-packaged foods, processed meat -most experts advise limiting sodium to < 2300mg  per day, should limit further is any chronic conditions such as high blood pressure, heart disease, diabetes, etc. The American Heart Association advised that < 1500mg  is  is ideal -try to avoid foods that contain any ingredients with names you do not recognize  -try to avoid sugar/sweets (except for the natural sugar that occurs in fresh fruit) -try to avoid sweet drinks -try to avoid white rice, white bread, pasta (unless whole grain), white or yellow potatoes  EXERCISE GUIDELINES FOR ADULTS: -if you wish to increase your physical activity, do so gradually and with the approval of your doctor -STOP and seek medical care immediately if you have any chest pain, chest discomfort or trouble breathing when starting or increasing exercise  -move and stretch your body, legs, feet and arms when sitting for long periods -Physical activity guidelines for optimal health in adults: -least 150 minutes per week of  aerobic exercise (can talk, but not sing) once approved by your doctor, 20-30 minutes of sustained activity or two 10 minute episodes of sustained activity every day.  -resistance training at least 2 days per week if approved by your doctor -balance exercises 3+ days per week:   Stand somewhere where you have something sturdy to hold onto if you lose balance.    1) lift up on toes, start with 5x per day and work up to 20x   2) stand and lift on leg straight out to the side so that foot is a few inches of the floor, start with 5x each side and work up to 20x each side   3) stand on one foot, start with 5 seconds each side and work up to 20 seconds on each side  If you need ideas or help with getting more active:  -Silver sneakers https://tools.silversneakers.com  -Walk with a Doc: http://stephens-thompson.biz/  -try to include resistance (weight lifting/strength building) and balance exercises twice per week: or the following link for ideas: ChessContest.fr  UpdateClothing.com.cy  STRESS MANAGEMENT: -can try meditating, or just sitting quietly with deep breathing while intentionally relaxing all parts of your body for 5 minutes daily -if you need further help with stress, anxiety or depression please follow up with your primary doctor or contact the wonderful folks at [facility]: [place]: -options in [place] if you wish to engage in more social and exercise related activities:  -Silver sneakers https://tools.silversneakers.com  -Walk with a Doc: http://stephens-thompson.biz/  -Check out the [place] 50+ section on the [place] [employer] (hiking clubs, book clubs, cards and games, chess, exercise classes, aquatic classes and much more) - see the website for  details: https://www.[place].gov/departments/parks-recreation/active-adults50  -YouTube has lots of exercise videos for different ages and abilities as well  -[place] (a variety of indoor and outdoor inperson activities for adults). 4241986918. [address]  -Virtual Online Classes (a variety of topics): see seniorplanet.org or call 6478478923  -consider volunteering at a school, hospice center, church, senior center or elsewhere           Lucretia Kern, DO

## 2022-07-12 ENCOUNTER — Other Ambulatory Visit: Payer: Self-pay | Admitting: Adult Health

## 2022-07-12 DIAGNOSIS — F419 Anxiety disorder, unspecified: Secondary | ICD-10-CM

## 2022-08-03 DIAGNOSIS — Z7189 Other specified counseling: Secondary | ICD-10-CM | POA: Diagnosis not present

## 2022-08-03 DIAGNOSIS — C44391 Other specified malignant neoplasm of skin of nose: Secondary | ICD-10-CM | POA: Diagnosis not present

## 2022-08-03 DIAGNOSIS — C44321 Squamous cell carcinoma of skin of nose: Secondary | ICD-10-CM | POA: Diagnosis not present

## 2022-08-03 DIAGNOSIS — L989 Disorder of the skin and subcutaneous tissue, unspecified: Secondary | ICD-10-CM | POA: Diagnosis not present

## 2022-08-03 DIAGNOSIS — D485 Neoplasm of uncertain behavior of skin: Secondary | ICD-10-CM | POA: Diagnosis not present

## 2022-08-03 DIAGNOSIS — L821 Other seborrheic keratosis: Secondary | ICD-10-CM | POA: Diagnosis not present

## 2022-08-03 DIAGNOSIS — L570 Actinic keratosis: Secondary | ICD-10-CM | POA: Diagnosis not present

## 2022-08-03 DIAGNOSIS — D225 Melanocytic nevi of trunk: Secondary | ICD-10-CM | POA: Diagnosis not present

## 2022-08-03 DIAGNOSIS — C44319 Basal cell carcinoma of skin of other parts of face: Secondary | ICD-10-CM | POA: Diagnosis not present

## 2022-08-03 DIAGNOSIS — Z08 Encounter for follow-up examination after completed treatment for malignant neoplasm: Secondary | ICD-10-CM | POA: Diagnosis not present

## 2022-08-03 DIAGNOSIS — Z85828 Personal history of other malignant neoplasm of skin: Secondary | ICD-10-CM | POA: Diagnosis not present

## 2022-08-08 DIAGNOSIS — D485 Neoplasm of uncertain behavior of skin: Secondary | ICD-10-CM | POA: Diagnosis not present

## 2022-08-17 DIAGNOSIS — D0359 Melanoma in situ of other part of trunk: Secondary | ICD-10-CM | POA: Diagnosis not present

## 2022-08-17 DIAGNOSIS — L989 Disorder of the skin and subcutaneous tissue, unspecified: Secondary | ICD-10-CM | POA: Diagnosis not present

## 2022-08-26 ENCOUNTER — Encounter: Payer: Self-pay | Admitting: Plastic Surgery

## 2022-08-26 ENCOUNTER — Ambulatory Visit (INDEPENDENT_AMBULATORY_CARE_PROVIDER_SITE_OTHER): Payer: Medicare Other | Admitting: Plastic Surgery

## 2022-08-26 VITALS — BP 145/78 | HR 60 | Ht 72.0 in | Wt 168.4 lb

## 2022-08-26 DIAGNOSIS — C4359 Malignant melanoma of other part of trunk: Secondary | ICD-10-CM

## 2022-08-26 DIAGNOSIS — C44311 Basal cell carcinoma of skin of nose: Secondary | ICD-10-CM | POA: Diagnosis not present

## 2022-08-26 DIAGNOSIS — C4491 Basal cell carcinoma of skin, unspecified: Secondary | ICD-10-CM | POA: Insufficient documentation

## 2022-08-26 DIAGNOSIS — C439 Malignant melanoma of skin, unspecified: Secondary | ICD-10-CM | POA: Insufficient documentation

## 2022-08-26 DIAGNOSIS — C44310 Basal cell carcinoma of skin of unspecified parts of face: Secondary | ICD-10-CM

## 2022-08-26 NOTE — Progress Notes (Signed)
Patient ID: [name], male    DOB: Jul 20, 1940, 82 y.o.   MRN: [id number]   Chief Complaint  Patient presents with   Consult    The patient is an 82 yrs old male here for evaluation of his face and back.  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  I don't have the path but he said it was all out.  The skin has opened with the sutures loose.  I removed the sutures as they were not holding anything at this time.  The spot is ~ 2 x 2 x 0.5 cm and clean.  No sign of infection.  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  We won't know the extent of the reconstruction needed until it is done.  He will be having mohs done in 2 weeks.  He has an appointment with me already set up.  He has a right nasolabial flap done in the past.      Review of Systems  Constitutional: Negative.   HENT: Negative.    Eyes: Negative.   Respiratory: Negative.    Cardiovascular: Negative.   Gastrointestinal: Negative.   Endocrine: Negative.   Genitourinary: Negative.   Musculoskeletal: Negative.     Past Medical History:  Diagnosis Date   Arthritis    HANDS AND PT STATES HIS ARMS HURT   Brain tumor [facility])    age 82 - brain tumor removed left- benign - occas slight headaches since the surgery   Colonic mass 2001   Fatigue    x 1 week o as of 02-17-2020   Fibromyalgia 1990's    Frequent PVCs    Generalized body aches    x 1 week as of 02-17-2020   H/O inguinal hernia    right side   Hernia, umbilical    History of kidney stones    HX OF MULTIPLE KIDNEY STONES AND  CURRENTLY HAS BILATERAL RENAL STONES CAUSING ABDOMINAL AND BACK PAIN   Hypertension    high blood pressure readings    NSVT (nonsustained ventricular tachycardia) (HCC)    Premature atrial contractions    Prolonged Q-T interval on ECG    hx of none currently   Wears dentures    full dentures    Past Surgical History:  Procedure Laterality Date   BRAIN TUMOR EXCISION  age 82   CARDIAC CATHETERIZATION N/A 04/09/2015   Procedure:  Left Heart Cath and Coronary Angiography;  Surgeon: Kathleene Hazel, MD;  Location: [facility] INVASIVE CV LAB;  Service: Cardiovascular;  Laterality: N/A;   COLON SURGERY  2001   COLON RESECTION FOR GROWTH - NOT CANCER   CYSTOSCOPY WITH RETROGRADE PYELOGRAM, URETEROSCOPY AND STENT PLACEMENT Left 10/05/2012   Procedure: CYSTOSCOPY WITH RETROGRADE PYELOGRAM, URETEROSCOPY AND STENT PLACEMENT;  Surgeon: Sebastian Ache, MD;  Location: [facility];  Service: Urology;  Laterality: Left;   CYSTOSCOPY/RETROGRADE/URETEROSCOPY/STONE EXTRACTION WITH BASKET Right 10/05/2012   Procedure: CYSTOSCOPY/RETROGRADE/URETEROSCOPY/STONE EXTRACTION WITH BASKET;  Surgeon: Sebastian Ache, MD;  Location: [facility];  Service: Urology;  Laterality: Right;   CYSTOSCOPY/RETROGRADE/URETEROSCOPY/STONE EXTRACTION WITH BASKET Bilateral 10/24/2012   Procedure: CYSTOSCOPY/RETROGRADE/URETEROSCOPY/STONE EXTRACTION WITH BASKET;  Surgeon: Sebastian Ache, MD;  Location: [facility];  Service: Urology;  Laterality: Bilateral;  with bilateral stent placement   HOLMIUM LASER APPLICATION Right 10/05/2012   Procedure: HOLMIUM LASER APPLICATION;  Surgeon: Sebastian Ache, MD;  Location: [facility];  Service: Urology;  Laterality: Right;   HOLMIUM LASER APPLICATION Bilateral 10/24/2012   Procedure: HOLMIUM LASER APPLICATION;  Surgeon: Sebastian Ache, MD;  Location: [facility];  Service: Urology;  Laterality: Bilateral;   LITHOTRIPSY     TONSILLECTOMY  age 9's   and adenoids      Current Outpatient Medications:    aspirin EC 81 MG tablet, Take 1 tablet (81 mg total) by mouth daily., Disp: 90 tablet, Rfl: 3   atorvastatin (LIPITOR) 10 MG tablet, Take 1 tablet (10 mg total) by mouth daily., Disp: 90 tablet, Rfl: 2   buPROPion (WELLBUTRIN XL) 150 MG 24 hr tablet, TAKE 1 TABLET BY MOUTH EVERY DAY, Disp: 90 tablet, Rfl: 0   carvedilol (COREG) 3.125 MG tablet, TAKE 1 TABLET BY MOUTH 2 TIMES DAILY., Disp: 180 tablet, Rfl: 1   Cranberry 1000 MG CAPS, Take 1,000 mg by mouth  daily. , Disp: , Rfl:    lisinopril (ZESTRIL) 10 MG tablet, Take 1 tablet (10 mg total) by mouth daily., Disp: 90 tablet, Rfl: 3   meclizine (ANTIVERT) 25 MG tablet, Take 1 tablet (25 mg total) by mouth 3 (three) times daily as needed for dizziness., Disp: 30 tablet, Rfl: 2   Misc Natural Products (OSTEO BI-FLEX ADV JOINT SHIELD PO), Take 2 capsules by mouth daily., Disp: , Rfl:    Multiple Vitamin (MULTIVITAMIN) tablet, Take 1 tablet by mouth daily., Disp: , Rfl:    ondansetron (ZOFRAN) 4 MG tablet, Take 1 tablet (4 mg total) by mouth every 8 (eight) hours as needed for nausea or vomiting., Disp: 20 tablet, Rfl: 1   ondansetron (ZOFRAN-ODT) 4 MG disintegrating tablet, Take 1 tablet (4 mg total) by mouth every 8 (eight) hours as needed for nausea or vomiting., Disp: 20 tablet, Rfl: 1   tamsulosin (FLOMAX) 0.4 MG CAPS capsule, Take 1 capsule (0.4 mg total) by mouth daily., Disp: 90 capsule, Rfl: 3   Objective:   Vitals:   08/26/22 1245  BP: (!) 145/78  Pulse: 60  SpO2: 96%    Physical Exam Constitutional:      Appearance: Normal appearance.  Cardiovascular:     Rate and Rhythm: Normal rate.     Pulses: Normal pulses.  Pulmonary:     Effort: Pulmonary effort is normal.  Musculoskeletal:        General: Tenderness and deformity present. No swelling.       Arms:  Skin:    General: Skin is warm.     Capillary Refill: Capillary refill takes less than 2 seconds.  Neurological:     Mental Status: He is alert and oriented to person, place, and time.  Psychiatric:        Mood and Affect: Mood normal.        Behavior: Behavior normal.        Thought Content: Thought content normal.        Judgment: Judgment normal.     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  KY gel daily and follow up in one week.  Plan for reconstruction of nose with skin graft versus forehead flap  or left nasolabial flap.  Alena Bills Keina Mutch, DO

## 2022-08-30 ENCOUNTER — Telehealth: Payer: Self-pay | Admitting: *Deleted

## 2022-08-30 ENCOUNTER — Telehealth: Payer: Self-pay | Admitting: Plastic Surgery

## 2022-08-30 ENCOUNTER — Other Ambulatory Visit: Payer: Self-pay | Admitting: Family Medicine

## 2022-08-30 DIAGNOSIS — I10 Essential (primary) hypertension: Secondary | ICD-10-CM

## 2022-08-30 NOTE — Telephone Encounter (Signed)
Received on (08/29/22) from [employer] -Urgent:Documentation Needed.  Stating:Attention,there are some issues delaying service for the patient.  Please contact:[employer] directly to help [place] clarify the needed information.  Copy scanned into the chart.  Called [employer] and spoke with agent regarding the message above.  Was informed that they needed to clarify the order for the Melpix.  Informed the agent that yes it's Melpix border daily.  Agent verified the Melpix border, and stated that the order will be sent out to the patient.  Called and spoke with the patient and informed him of the hole up on the supply order.  Patient verbalized understanding and agreed.  He stated that he has been dressing the wound, and using Vaseline.  Patient stated that he has an appointment with [place] on (09/01/22).  He asked what is he to do with the supplies when they come.  Informed him that he can use them.  Patient verbalized understanding and agreed.//AB/CMA

## 2022-08-30 NOTE — Telephone Encounter (Signed)
Kristine from [employer] called and lvm about a form they received for patient and need some info clarified. [phone]

## 2022-08-30 NOTE — Telephone Encounter (Signed)
Faxed order on (08/27/22) to [facility] for supplies for the patient.  Confirmation received and copy scanned into the chart.//AB/CMA

## 2022-09-01 ENCOUNTER — Ambulatory Visit (INDEPENDENT_AMBULATORY_CARE_PROVIDER_SITE_OTHER): Payer: Medicare Other | Admitting: Student

## 2022-09-01 ENCOUNTER — Telehealth: Payer: Self-pay

## 2022-09-01 ENCOUNTER — Encounter: Payer: Self-pay | Admitting: Student

## 2022-09-01 ENCOUNTER — Telehealth: Payer: Self-pay | Admitting: *Deleted

## 2022-09-01 VITALS — BP 169/115 | HR 72 | Ht 72.0 in | Wt 168.4 lb

## 2022-09-01 DIAGNOSIS — C44310 Basal cell carcinoma of skin of unspecified parts of face: Secondary | ICD-10-CM

## 2022-09-01 DIAGNOSIS — C439 Malignant melanoma of skin, unspecified: Secondary | ICD-10-CM

## 2022-09-01 NOTE — Telephone Encounter (Signed)
Received on (08/30/22) via of fax Order Status Notification from [facility].  Stating:[employer] has provided service for the patient; no further action is required.  Copy scanned into the chart.//AB/CMA

## 2022-09-01 NOTE — Progress Notes (Signed)
Patient ID: [name], male    DOB: [birth year], 82 y.o.   MRN: [id number]  Chief Complaint  Patient presents with   Pre-op Exam      ICD-10-CM   1. Melanoma of skin (HCC)  C43.9     2. 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  male  with a history of melanoma status post Mohs procedure and skin lesion to the nose.  He presents for preoperative evaluation for upcoming procedure, Mohs repair and application of skin substitute, scheduled for 09/21/2022 with Dr. Ulice Bold.  Summary of Previous Visit: Patient was seen for consult by Dr. Ulice Bold on 08/26/2022.  At this visit, patient reported he had underwent excision of back melanoma 1 week prior to this appointment.  Patient reported that all of the melanoma was out.  Sutures to the opening were removed.  The spot was approximately 2 x 2 x 0.5 cm and was clean.  To his nose, there was a likely basal cell carcinoma.  It is unsure the extent of reconstruction that was needed until the procedure is done.  Patient reported he was having Mohs in 2 weeks.  Patient reports he has had a right nasolabial flap done in the past.  Donated myriad was placed on patient's back.  Plan was to apply K-Y jelly daily and follow-up in a week.  Plan was to schedule reconstruction of the nose with skin graft versus forehead flap or left nasolabial flap.  Today, patient presents with his wife at bedside.  They state they have some questions about the wound to the back.  They state they have been applying Vaseline and gauze.  Patient's blood pressure upon entering the clinic was 192/119.  His blood pressure then came down to 169/115.  He denied any blurred vision, chest pain or shortness of breath.  He states that he deals with chronic dizziness, but does not report any worsening dizziness.  He does report he has ringing in his ears, which he usually  intermittently experiences, but states today that the ringing in his ears is a little worse.  He states he does have some mild headaches.  Physical Exam: Vital Signs BP (!) 169/115 (BP Location: Left Arm, Patient Position: Sitting, Cuff Size: Normal)   Pulse 72   Ht 6' (1.829 m)   Wt 168 lb 6.4 oz (76.4 kg)   SpO2 97%   BMI 22.84 kg/m   Physical Exam  Constitutional:      General: Not in acute distress.    Appearance: Normal appearance. Not ill-appearing.  HENT:     Head: Normocephalic and atraumatic.  Eyes:     Pupils: Pupils are equal, round Neck:     Musculoskeletal: Normal range of motion.  Cardiovascular:     Rate and Rhythm: Normal rate Pulmonary:     Effort: Pulmonary effort is normal. No respiratory distress.  Musculoskeletal: Ambulatory Skin:    Back: Wound to the back is 3 x 4 cm.  Good granulation tissue noted throughout.  No signs of infection on exam.    General: Skin is warm and dry.     Findings: No erythema or rash.  Neurological:     Mental Status: Alert and oriented to person, place, and time. Mental status is at baseline.  Psychiatric:        Mood  and Affect: Mood normal.        Behavior: Behavior normal.    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  I discussed with him that if he is not able to get in with his PCP, he needs to be seen in an urgent care.  Patient's wife states that they will drive directly to their primary care provider.  Discussed with him that we will reschedule preoperative appointment so that he can be seen by a provider for his elevated blood pressure.  Patient and patient's wife expressed understanding and were in agreement.  Discussed with patient's wife that she may apply Adaptic, K-Y jelly and Mepilex border or  gauze and tape over wound daily.  Patient expressed understanding.  Patient to follow back up next week for preoperative appointment.  Electronically signed by: Laurena Spies, PA-C 09/01/2022 4:16 PM

## 2022-09-01 NOTE — Telephone Encounter (Signed)
Pt walked in c/o elevated Bp. Pt stated he left plastic surgeons office and advise pt to f/u with PCP. Pt claimed his bp was 190 but does not remember bottom number. I checked pt Bp and it was 160/120 twice. Spke to [facility] verbally and he advised that pt recheck Bp tonight and tomorrow and call with readings. Pt verbalized understanding and will call tomorrow with f/u readings.

## 2022-09-01 NOTE — Telephone Encounter (Signed)
See note on (08/30/22).//AB/CMA

## 2022-09-02 NOTE — Telephone Encounter (Signed)
Spoke to pt and he stated that his BP reading were 140 but does not remember the bottom number. Pt stated it is going down and he is feeling ok.

## 2022-09-06 DIAGNOSIS — Z48817 Encounter for surgical aftercare following surgery on the skin and subcutaneous tissue: Secondary | ICD-10-CM | POA: Diagnosis not present

## 2022-09-06 DIAGNOSIS — C44319 Basal cell carcinoma of skin of other parts of face: Secondary | ICD-10-CM | POA: Diagnosis not present

## 2022-09-08 ENCOUNTER — Encounter: Payer: Medicare Other | Admitting: Student

## 2022-09-08 NOTE — Progress Notes (Deleted)
Patient ID: [name], male    DOB: [birth year], 82 y.o.   MRN: [id number]  No chief complaint on file.   No diagnosis found.   History of Present Illness: [name] is a 82 y.o.  male  with a history of ***.  He presents for preoperative evaluation for upcoming procedure, ***, scheduled for *** with Dr. {VFIEP:[id number]::"OACZYS","[id number]"}.  The patient {HAS HAS NAT:55732} had problems with anesthesia. ***  Summary of Previous Visit: ***  Job: ***  PMH Significant for: ***   Past Medical History: Allergies: Allergies  Allergen Reactions   Other Other (See Comments)    Mangos - caused a severe rash   Prozac [Fluoxetine Hcl]     Made him feel "loopy"   Sulfa Antibiotics Hives   Vicodin [Hydrocodone-Acetaminophen] Itching    Tolerates oxycodone   Erythromycin Rash    Many years ago an oral antibiotic caused a rash(wife and patient think it was erythromycin but they are not positive).    Current Medications:  Current Outpatient Medications:    aspirin EC 81 MG tablet, Take 1 tablet (81 mg total) by mouth daily., Disp: 90 tablet, Rfl: 3   atorvastatin (LIPITOR) 10 MG tablet, Take 1 tablet (10 mg total) by mouth daily., Disp: 90 tablet, Rfl: 2   buPROPion (WELLBUTRIN XL) 150 MG 24 hr tablet, TAKE 1 TABLET BY MOUTH EVERY DAY, Disp: 90 tablet, Rfl: 0   carvedilol (COREG) 3.125 MG tablet, TAKE 1 TABLET BY MOUTH 2 TIMES DAILY., Disp: 180 tablet, Rfl: 1   Cranberry 1000 MG CAPS, Take 1,000 mg by mouth daily. , Disp: , Rfl:    lisinopril (ZESTRIL) 10 MG tablet, TAKE 1 TABLET BY MOUTH EVERY DAY, Disp: 90 tablet, Rfl: 3   meclizine (ANTIVERT) 25 MG tablet, Take 1 tablet (25 mg total) by mouth 3 (three) times daily as needed for dizziness., Disp: 30 tablet, Rfl: 2   Misc Natural Products (OSTEO BI-FLEX ADV JOINT SHIELD PO), Take 2 capsules by mouth daily., Disp: , Rfl:    Multiple Vitamin (MULTIVITAMIN) tablet, Take 1 tablet by mouth daily., Disp: , Rfl:     ondansetron (ZOFRAN) 4 MG tablet, Take 1 tablet (4 mg total) by mouth every 8 (eight) hours as needed for nausea or vomiting., Disp: 20 tablet, Rfl: 1   ondansetron (ZOFRAN-ODT) 4 MG disintegrating tablet, Take 1 tablet (4 mg total) by mouth every 8 (eight) hours as needed for nausea or vomiting., Disp: 20 tablet, Rfl: 1   tamsulosin (FLOMAX) 0.4 MG CAPS capsule, Take 1 capsule (0.4 mg total) by mouth daily., Disp: 90 capsule, Rfl: 3  Past Medical Problems: Past Medical History:  Diagnosis Date   Arthritis    HANDS AND PT STATES HIS ARMS HURT   Brain tumor [facility])    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 - occas slight headaches since the surgery   Colonic mass 2001   Fatigue    x 1 week o as of 02-17-2020   Fibromyalgia 1990's    Frequent PVCs    Generalized body aches    x 1 week as of 02-17-2020   H/O inguinal hernia    right side   Hernia, umbilical    History of kidney stones    HX OF MULTIPLE KIDNEY STONES AND  CURRENTLY HAS BILATERAL RENAL STONES CAUSING ABDOMINAL AND BACK PAIN   Hypertension    high blood pressure readings    NSVT (nonsustained ventricular tachycardia) (HCC)  Premature atrial contractions    Prolonged Q-T interval on ECG    hx of none currently   Wears dentures    full dentures    Past Surgical History: Past Surgical History:  Procedure Laterality Date   BRAIN TUMOR EXCISION  age 83   CARDIAC CATHETERIZATION N/A 04/09/2015   Procedure: Left Heart Cath and Coronary Angiography;  Surgeon: Kathleene Hazel, MD;  Location: [facility] INVASIVE CV LAB;  Service: Cardiovascular;  Laterality: N/A;   COLON SURGERY  2001   COLON RESECTION FOR GROWTH - NOT CANCER   CYSTOSCOPY WITH RETROGRADE PYELOGRAM, URETEROSCOPY AND STENT PLACEMENT Left 10/05/2012   Procedure: CYSTOSCOPY WITH RETROGRADE PYELOGRAM, URETEROSCOPY AND STENT PLACEMENT;  Surgeon: Sebastian Ache, MD;  Location: [facility];  Service: Urology;  Laterality: Left;   CYSTOSCOPY/RETROGRADE/URETEROSCOPY/STONE  EXTRACTION WITH BASKET Right 10/05/2012   Procedure: CYSTOSCOPY/RETROGRADE/URETEROSCOPY/STONE EXTRACTION WITH BASKET;  Surgeon: Sebastian Ache, MD;  Location: [facility];  Service: Urology;  Laterality: Right;   CYSTOSCOPY/RETROGRADE/URETEROSCOPY/STONE EXTRACTION WITH BASKET Bilateral 10/24/2012   Procedure: CYSTOSCOPY/RETROGRADE/URETEROSCOPY/STONE EXTRACTION WITH BASKET;  Surgeon: Sebastian Ache, MD;  Location: [facility];  Service: Urology;  Laterality: Bilateral;  with bilateral stent placement   HOLMIUM LASER APPLICATION Right 10/05/2012   Procedure: HOLMIUM LASER APPLICATION;  Surgeon: Sebastian Ache, MD;  Location: [facility];  Service: Urology;  Laterality: Right;   HOLMIUM LASER APPLICATION Bilateral 10/24/2012   Procedure: HOLMIUM LASER APPLICATION;  Surgeon: Sebastian Ache, MD;  Location: [facility];  Service: Urology;  Laterality: Bilateral;   LITHOTRIPSY     TONSILLECTOMY  age 85's   and adenoids    Social History: Social History   Socioeconomic History   Marital status: Single    Spouse name: Not on file   Number of children: Not on file   Years of education: Not on file   Highest education level: Not on file  Occupational History   Not on file  Tobacco Use   Smoking status: Never   Smokeless tobacco: Never  Vaping Use   Vaping status: Never Used  Substance and Sexual Activity   Alcohol use: Yes    Alcohol/week: 7.0 standard drinks of alcohol    Types: 7 Standard drinks or equivalent per week    Comment: 1 drink per day   Drug use: Not Currently   Sexual activity: Not on file  Other Topics Concern   Not on file  Social History Narrative   Retired from Engineer, manufacturing    Has one son - lives local    Likes to play Tennis   Social Determinants of Health   Financial Resource Strain: Low Risk  (05/06/2021)   Overall Financial Resource Strain (CARDIA)    Difficulty of Paying Living Expenses: Not hard at all  Food Insecurity: No Food Insecurity (05/06/2021)   Hunger Vital Sign     Worried About Running Out of Food in the Last Year: Never true    Ran Out of Food in the Last Year: Never true  Transportation Needs: No Transportation Needs (05/06/2021)   PRAPARE - Administrator, Civil Service (Medical): No    Lack of Transportation (Non-Medical): No  Physical Activity: Insufficiently Active (05/06/2021)   Exercise Vital Sign    Days of Exercise per Week: 1 day    Minutes of Exercise per Session: 90 min  Stress: No Stress Concern Present (05/06/2021)   [employer] of Occupational Health - Occupational Stress Questionnaire    Feeling of Stress : Not at all  Social Connections: Moderately  Isolated (05/06/2021)   Social Connection and Isolation Panel [NHANES]    Frequency of Communication with Friends and Family: More than three times a week    Frequency of Social Gatherings with Friends and Family: More than three times a week    Attends Religious Services: Never    Database administrator or Organizations: Yes    Attends Engineer, structural: More than 4 times per year    Marital Status: Never married  Intimate Partner Violence: Not At Risk (05/06/2021)   Humiliation, Afraid, Rape, and Kick questionnaire    Fear of Current or Ex-Partner: No    Emotionally Abused: No    Physically Abused: No    Sexually Abused: No    Family History: Family History  Problem Relation Age of Onset   Colon cancer Father    Heart disease Father        quad bypass    Review of Systems: ROS  Physical Exam: Vital Signs There were no vitals taken for this visit.  Physical Exam *** Constitutional:      General: Not in acute distress.    Appearance: Normal appearance. Not ill-appearing.  HENT:     Head: Normocephalic and atraumatic.  Eyes:     Pupils: Pupils are equal, round Neck:     Musculoskeletal: Normal range of motion.  Cardiovascular:     Rate and Rhythm: Normal rate    Pulses: Normal pulses.  Pulmonary:     Effort: Pulmonary effort is  normal. No respiratory distress.  Abdominal:     General: Abdomen is flat. There is no distension.  Musculoskeletal: Normal range of motion.  Skin:    General: Skin is warm and dry.     Findings: No erythema or rash.  Neurological:     General: No focal deficit present.     Mental Status: Alert and oriented to person, place, and time. Mental status is at baseline.     Motor: No weakness.  Psychiatric:        Mood and Affect: Mood normal.        Behavior: Behavior normal.    Assessment/Plan: The patient is scheduled for *** with Dr. {RUEAV:40981::"XBJYNW","GNFAOZHYQM"}.  Risks, benefits, and alternatives of procedure discussed, questions answered and consent obtained.    Smoking Status: ***; Counseling Given? *** Last Mammogram: ***; Results: ***  Caprini Score: ***; Risk Factors include: ***, BMI *** 25, and length of planned surgery. Recommendation for mechanical *** prophylaxis. Encourage early ambulation.   Pictures obtained: @consult ***  Post-op Rx sent to pharmacy: {Blank:19197::"Oxycodone, Zofran, Keflex","Oxycodone, Zofran"}  Patient was provided with the *** General Surgical Risk consent document and Pain Medication Agreement prior to their appointment.  They had adequate time to read through the risk consent documents and Pain Medication Agreement. We also discussed them in person together during this preop appointment. All of their questions were answered to their satisfaction.  Recommended calling if they have any further questions.  Risk consent form and Pain Medication Agreement to be scanned into patient's chart.  ***   Electronically signed by: Laurena Spies, PA-C 09/08/2022 8:49 AM

## 2022-09-13 ENCOUNTER — Encounter: Payer: Medicare Other | Admitting: Physician Assistant

## 2022-09-15 ENCOUNTER — Other Ambulatory Visit: Payer: Self-pay | Admitting: Adult Health

## 2022-09-15 DIAGNOSIS — I10 Essential (primary) hypertension: Secondary | ICD-10-CM

## 2022-09-21 ENCOUNTER — Ambulatory Visit: Payer: Medicare Other | Admitting: Adult Health

## 2022-09-21 ENCOUNTER — Emergency Department (HOSPITAL_COMMUNITY): Payer: Medicare Other

## 2022-09-21 ENCOUNTER — Other Ambulatory Visit: Payer: Self-pay

## 2022-09-21 ENCOUNTER — Inpatient Hospital Stay (HOSPITAL_COMMUNITY): Payer: Medicare Other

## 2022-09-21 ENCOUNTER — Ambulatory Visit (HOSPITAL_BASED_OUTPATIENT_CLINIC_OR_DEPARTMENT_OTHER): Admit: 2022-09-21 | Payer: Medicare Other | Admitting: Plastic Surgery

## 2022-09-21 ENCOUNTER — Inpatient Hospital Stay (HOSPITAL_COMMUNITY)
Admission: EM | Admit: 2022-09-21 | Discharge: 2022-10-05 | DRG: 173 | Disposition: A | Discharge status: Home Health | Payer: Medicare Other | Attending: Family Medicine | Admitting: Family Medicine

## 2022-09-21 ENCOUNTER — Encounter (HOSPITAL_BASED_OUTPATIENT_CLINIC_OR_DEPARTMENT_OTHER): Payer: Self-pay

## 2022-09-21 DIAGNOSIS — Z85828 Personal history of other malignant neoplasm of skin: Secondary | ICD-10-CM | POA: Diagnosis not present

## 2022-09-21 DIAGNOSIS — I2699 Other pulmonary embolism without acute cor pulmonale: Secondary | ICD-10-CM | POA: Diagnosis present

## 2022-09-21 DIAGNOSIS — M1711 Unilateral primary osteoarthritis, right knee: Secondary | ICD-10-CM | POA: Diagnosis present

## 2022-09-21 DIAGNOSIS — K6389 Other specified diseases of intestine: Secondary | ICD-10-CM | POA: Diagnosis not present

## 2022-09-21 DIAGNOSIS — D62 Acute posthemorrhagic anemia: Secondary | ICD-10-CM | POA: Diagnosis not present

## 2022-09-21 DIAGNOSIS — R0902 Hypoxemia: Secondary | ICD-10-CM | POA: Diagnosis not present

## 2022-09-21 DIAGNOSIS — I2609 Other pulmonary embolism with acute cor pulmonale: Principal | ICD-10-CM | POA: Diagnosis present

## 2022-09-21 DIAGNOSIS — Z800 Family history of malignant neoplasm of digestive organs: Secondary | ICD-10-CM

## 2022-09-21 DIAGNOSIS — R6520 Severe sepsis without septic shock: Secondary | ICD-10-CM | POA: Diagnosis not present

## 2022-09-21 DIAGNOSIS — R079 Chest pain, unspecified: Secondary | ICD-10-CM | POA: Diagnosis not present

## 2022-09-21 DIAGNOSIS — Z8249 Family history of ischemic heart disease and other diseases of the circulatory system: Secondary | ICD-10-CM | POA: Diagnosis not present

## 2022-09-21 DIAGNOSIS — M25561 Pain in right knee: Secondary | ICD-10-CM | POA: Diagnosis not present

## 2022-09-21 DIAGNOSIS — Z86011 Personal history of benign neoplasm of the brain: Secondary | ICD-10-CM

## 2022-09-21 DIAGNOSIS — M7121 Synovial cyst of popliteal space [Baker], right knee: Secondary | ICD-10-CM | POA: Diagnosis not present

## 2022-09-21 DIAGNOSIS — I50810 Right heart failure, unspecified: Secondary | ICD-10-CM | POA: Diagnosis not present

## 2022-09-21 DIAGNOSIS — R310 Gross hematuria: Secondary | ICD-10-CM | POA: Diagnosis not present

## 2022-09-21 DIAGNOSIS — J189 Pneumonia, unspecified organism: Secondary | ICD-10-CM | POA: Insufficient documentation

## 2022-09-21 DIAGNOSIS — M797 Fibromyalgia: Secondary | ICD-10-CM | POA: Diagnosis present

## 2022-09-21 DIAGNOSIS — I6523 Occlusion and stenosis of bilateral carotid arteries: Secondary | ICD-10-CM | POA: Diagnosis not present

## 2022-09-21 DIAGNOSIS — Z79899 Other long term (current) drug therapy: Secondary | ICD-10-CM

## 2022-09-21 DIAGNOSIS — Z882 Allergy status to sulfonamides status: Secondary | ICD-10-CM

## 2022-09-21 DIAGNOSIS — M79661 Pain in right lower leg: Secondary | ICD-10-CM | POA: Diagnosis not present

## 2022-09-21 DIAGNOSIS — I491 Atrial premature depolarization: Secondary | ICD-10-CM | POA: Diagnosis present

## 2022-09-21 DIAGNOSIS — T8384XA Pain from genitourinary prosthetic devices, implants and grafts, initial encounter: Secondary | ICD-10-CM | POA: Diagnosis not present

## 2022-09-21 DIAGNOSIS — I10 Essential (primary) hypertension: Secondary | ICD-10-CM

## 2022-09-21 DIAGNOSIS — N400 Enlarged prostate without lower urinary tract symptoms: Secondary | ICD-10-CM | POA: Diagnosis not present

## 2022-09-21 DIAGNOSIS — Y95 Nosocomial condition: Secondary | ICD-10-CM | POA: Diagnosis not present

## 2022-09-21 DIAGNOSIS — R42 Dizziness and giddiness: Secondary | ICD-10-CM | POA: Diagnosis not present

## 2022-09-21 DIAGNOSIS — J9601 Acute respiratory failure with hypoxia: Secondary | ICD-10-CM | POA: Diagnosis present

## 2022-09-21 DIAGNOSIS — M25461 Effusion, right knee: Secondary | ICD-10-CM | POA: Diagnosis not present

## 2022-09-21 DIAGNOSIS — N202 Calculus of kidney with calculus of ureter: Secondary | ICD-10-CM | POA: Diagnosis present

## 2022-09-21 DIAGNOSIS — I110 Hypertensive heart disease with heart failure: Secondary | ICD-10-CM | POA: Diagnosis present

## 2022-09-21 DIAGNOSIS — Z888 Allergy status to other drugs, medicaments and biological substances status: Secondary | ICD-10-CM

## 2022-09-21 DIAGNOSIS — I480 Paroxysmal atrial fibrillation: Secondary | ICD-10-CM | POA: Diagnosis present

## 2022-09-21 DIAGNOSIS — I82411 Acute embolism and thrombosis of right femoral vein: Secondary | ICD-10-CM | POA: Diagnosis not present

## 2022-09-21 DIAGNOSIS — C439 Malignant melanoma of skin, unspecified: Secondary | ICD-10-CM | POA: Diagnosis present

## 2022-09-21 DIAGNOSIS — A419 Sepsis, unspecified organism: Secondary | ICD-10-CM | POA: Diagnosis not present

## 2022-09-21 DIAGNOSIS — K4090 Unilateral inguinal hernia, without obstruction or gangrene, not specified as recurrent: Secondary | ICD-10-CM | POA: Diagnosis not present

## 2022-09-21 DIAGNOSIS — N3289 Other specified disorders of bladder: Secondary | ICD-10-CM | POA: Diagnosis present

## 2022-09-21 DIAGNOSIS — E785 Hyperlipidemia, unspecified: Secondary | ICD-10-CM | POA: Diagnosis present

## 2022-09-21 DIAGNOSIS — I2729 Other secondary pulmonary hypertension: Secondary | ICD-10-CM | POA: Diagnosis not present

## 2022-09-21 DIAGNOSIS — Z7901 Long term (current) use of anticoagulants: Secondary | ICD-10-CM

## 2022-09-21 DIAGNOSIS — M109 Gout, unspecified: Secondary | ICD-10-CM | POA: Diagnosis present

## 2022-09-21 DIAGNOSIS — Y846 Urinary catheterization as the cause of abnormal reaction of the patient, or of later complication, without mention of misadventure at the time of the procedure: Secondary | ICD-10-CM | POA: Diagnosis not present

## 2022-09-21 DIAGNOSIS — Z85820 Personal history of malignant melanoma of skin: Secondary | ICD-10-CM

## 2022-09-21 DIAGNOSIS — U099 Post covid-19 condition, unspecified: Secondary | ICD-10-CM | POA: Diagnosis present

## 2022-09-21 DIAGNOSIS — R109 Unspecified abdominal pain: Secondary | ICD-10-CM | POA: Diagnosis not present

## 2022-09-21 DIAGNOSIS — Z7982 Long term (current) use of aspirin: Secondary | ICD-10-CM

## 2022-09-21 DIAGNOSIS — Z881 Allergy status to other antibiotic agents status: Secondary | ICD-10-CM

## 2022-09-21 DIAGNOSIS — R519 Headache, unspecified: Secondary | ICD-10-CM | POA: Diagnosis not present

## 2022-09-21 DIAGNOSIS — T45615A Adverse effect of thrombolytic drugs, initial encounter: Secondary | ICD-10-CM | POA: Diagnosis not present

## 2022-09-21 DIAGNOSIS — N201 Calculus of ureter: Secondary | ICD-10-CM | POA: Diagnosis not present

## 2022-09-21 DIAGNOSIS — R600 Localized edema: Secondary | ICD-10-CM | POA: Diagnosis not present

## 2022-09-21 DIAGNOSIS — R0602 Shortness of breath: Secondary | ICD-10-CM | POA: Diagnosis not present

## 2022-09-21 DIAGNOSIS — N179 Acute kidney failure, unspecified: Secondary | ICD-10-CM | POA: Insufficient documentation

## 2022-09-21 DIAGNOSIS — K439 Ventral hernia without obstruction or gangrene: Secondary | ICD-10-CM | POA: Diagnosis not present

## 2022-09-21 DIAGNOSIS — G8929 Other chronic pain: Secondary | ICD-10-CM | POA: Diagnosis present

## 2022-09-21 DIAGNOSIS — D75839 Thrombocytosis, unspecified: Secondary | ICD-10-CM | POA: Diagnosis present

## 2022-09-21 DIAGNOSIS — R1084 Generalized abdominal pain: Secondary | ICD-10-CM | POA: Diagnosis not present

## 2022-09-21 DIAGNOSIS — C4491 Basal cell carcinoma of skin, unspecified: Secondary | ICD-10-CM | POA: Diagnosis present

## 2022-09-21 DIAGNOSIS — J159 Unspecified bacterial pneumonia: Secondary | ICD-10-CM | POA: Diagnosis not present

## 2022-09-21 DIAGNOSIS — R918 Other nonspecific abnormal finding of lung field: Secondary | ICD-10-CM | POA: Diagnosis not present

## 2022-09-21 DIAGNOSIS — Z885 Allergy status to narcotic agent status: Secondary | ICD-10-CM

## 2022-09-21 DIAGNOSIS — T8383XA Hemorrhage of genitourinary prosthetic devices, implants and grafts, initial encounter: Secondary | ICD-10-CM | POA: Diagnosis not present

## 2022-09-21 DIAGNOSIS — I771 Stricture of artery: Secondary | ICD-10-CM | POA: Diagnosis not present

## 2022-09-21 DIAGNOSIS — R339 Retention of urine, unspecified: Secondary | ICD-10-CM | POA: Diagnosis not present

## 2022-09-21 DIAGNOSIS — K429 Umbilical hernia without obstruction or gangrene: Secondary | ICD-10-CM | POA: Diagnosis not present

## 2022-09-21 HISTORY — PX: IR INFUSION THROMBOL ARTERIAL INITIAL (MS): IMG5376

## 2022-09-21 HISTORY — PX: IR ANGIOGRAM PULMONARY BILATERAL SELECTIVE: IMG664

## 2022-09-21 HISTORY — PX: IR US GUIDE VASC ACCESS RIGHT: IMG2390

## 2022-09-21 LAB — URINALYSIS, W/ REFLEX TO CULTURE (INFECTION SUSPECTED)
Bacteria, UA: NONE SEEN
Bilirubin Urine: NEGATIVE
Glucose, UA: NEGATIVE mg/dL
Ketones, ur: NEGATIVE mg/dL
Leukocytes,Ua: NEGATIVE
Nitrite: NEGATIVE
Protein, ur: NEGATIVE mg/dL
RBC / HPF: >50 RBC/hpf (ref 0–5)
Specific Gravity, Urine: >1.046 — ABNORMAL HIGH (ref 1.005–1.030)
pH: 6 (ref 5.0–8.0)

## 2022-09-21 LAB — CBC WITH DIFFERENTIAL/PLATELET
Abs Immature Granulocytes: 0.03 10*3/uL (ref 0.00–0.07)
Basophils Absolute: 0 10*3/uL (ref 0.0–0.1)
Basophils Relative: 0 %
Eosinophils Absolute: 0.1 10*3/uL (ref 0.0–0.5)
Eosinophils Relative: 1 %
HCT: 42.3 % (ref 39.0–52.0)
Hemoglobin: 14.3 g/dL (ref 13.0–17.0)
Immature Granulocytes: 0 %
Lymphocytes Relative: 13 %
Lymphs Abs: 1.3 10*3/uL (ref 0.7–4.0)
MCH: 33 pg (ref 26.0–34.0)
MCHC: 33.8 g/dL (ref 30.0–36.0)
MCV: 97.7 fL (ref 80.0–100.0)
Monocytes Absolute: 1.2 10*3/uL — ABNORMAL HIGH (ref 0.1–1.0)
Monocytes Relative: 11 %
Neutro Abs: 7.9 10*3/uL — ABNORMAL HIGH (ref 1.7–7.7)
Neutrophils Relative %: 75 %
Platelets: 241 10*3/uL (ref 150–400)
RBC: 4.33 MIL/uL (ref 4.22–5.81)
RDW: 12.4 % (ref 11.5–15.5)
WBC: 10.6 10*3/uL — ABNORMAL HIGH (ref 4.0–10.5)
nRBC: 0 % (ref 0.0–0.2)

## 2022-09-21 LAB — APTT: aPTT: 27 seconds (ref 24–36)

## 2022-09-21 LAB — COMPREHENSIVE METABOLIC PANEL
ALT: 19 U/L (ref 0–44)
AST: 27 U/L (ref 15–41)
Albumin: 3.5 g/dL (ref 3.5–5.0)
Alkaline Phosphatase: 102 U/L (ref 38–126)
Anion gap: 8 (ref 5–15)
BUN: 18 mg/dL (ref 8–23)
CO2: 28 mmol/L (ref 22–32)
Calcium: 9.4 mg/dL (ref 8.9–10.3)
Chloride: 98 mmol/L (ref 98–111)
Creatinine, Ser: 1.29 mg/dL — ABNORMAL HIGH (ref 0.61–1.24)
GFR, Estimated: 56 mL/min — ABNORMAL LOW (ref >60)
Glucose, Bld: 125 mg/dL — ABNORMAL HIGH (ref 70–99)
Potassium: 3.8 mmol/L (ref 3.5–5.1)
Sodium: 134 mmol/L — ABNORMAL LOW (ref 135–145)
Total Bilirubin: 1.1 mg/dL (ref 0.3–1.2)
Total Protein: 6.4 g/dL — ABNORMAL LOW (ref 6.5–8.1)

## 2022-09-21 LAB — CBC
HCT: 42.6 % (ref 39.0–52.0)
HCT: 36.2 % — ABNORMAL LOW (ref 39.0–52.0)
Hemoglobin: 14 g/dL (ref 13.0–17.0)
Hemoglobin: 11.9 g/dL — ABNORMAL LOW (ref 13.0–17.0)
MCH: 32.4 pg (ref 26.0–34.0)
MCH: 32.4 pg (ref 26.0–34.0)
MCHC: 32.9 g/dL (ref 30.0–36.0)
MCHC: 32.9 g/dL (ref 30.0–36.0)
MCV: 98.6 fL (ref 80.0–100.0)
MCV: 98.6 fL (ref 80.0–100.0)
Platelets: 232 10*3/uL (ref 150–400)
Platelets: 195 10*3/uL (ref 150–400)
RBC: 4.32 MIL/uL (ref 4.22–5.81)
RBC: 3.67 MIL/uL — ABNORMAL LOW (ref 4.22–5.81)
RDW: 12.6 % (ref 11.5–15.5)
RDW: 12.6 % (ref 11.5–15.5)
WBC: 14.3 10*3/uL — ABNORMAL HIGH (ref 4.0–10.5)
WBC: 13.9 10*3/uL — ABNORMAL HIGH (ref 4.0–10.5)
nRBC: 0 % (ref 0.0–0.2)
nRBC: 0 % (ref 0.0–0.2)

## 2022-09-21 LAB — ECHOCARDIOGRAM COMPLETE
Height: 72 in
S' Lateral: 4 cm
Weight: 2688 oz

## 2022-09-21 LAB — TYPE AND SCREEN
ABO/RH(D): O POS
Antibody Screen: NEGATIVE

## 2022-09-21 LAB — CREATININE, URINE, RANDOM: Creatinine, Urine: 93 mg/dL

## 2022-09-21 LAB — PROTIME-INR
INR: 1.1 (ref 0.8–1.2)
Prothrombin Time: 14.6 seconds (ref 11.4–15.2)

## 2022-09-21 LAB — LIPASE, BLOOD: Lipase: 23 U/L (ref 11–51)

## 2022-09-21 LAB — SODIUM, URINE, RANDOM: Sodium, Ur: 60 mmol/L

## 2022-09-21 LAB — FIBRINOGEN
Fibrinogen: 285 mg/dL (ref 210–475)
Fibrinogen: 233 mg/dL (ref 210–475)

## 2022-09-21 LAB — TROPONIN I (HIGH SENSITIVITY)
Troponin I (High Sensitivity): 112 ng/L (ref <18)
Troponin I (High Sensitivity): 101 ng/L (ref <18)

## 2022-09-21 LAB — BRAIN NATRIURETIC PEPTIDE: B Natriuretic Peptide: 775.7 pg/mL — ABNORMAL HIGH (ref 0.0–100.0)

## 2022-09-21 LAB — PROCALCITONIN: Procalcitonin: <0.1 ng/mL

## 2022-09-21 LAB — ABO/RH: ABO/RH(D): O POS

## 2022-09-21 LAB — HEPARIN LEVEL (UNFRACTIONATED)
Heparin Unfractionated: 0.64 [IU]/mL (ref 0.30–0.70)
Heparin Unfractionated: 0.33 [IU]/mL (ref 0.30–0.70)

## 2022-09-21 LAB — I-STAT CG4 LACTIC ACID, ED: Lactic Acid, Venous: 1.1 mmol/L (ref 0.5–1.9)

## 2022-09-21 LAB — MRSA NEXT GEN BY PCR, NASAL: MRSA by PCR Next Gen: NOT DETECTED

## 2022-09-21 SURGERY — DEBRIDEMENT, WOUND, WITH CLOSURE
Anesthesia: Choice | Site: Nose

## 2022-09-21 MED ORDER — MIDAZOLAM HCL 2 MG/2ML IJ SOLN
INTRAMUSCULAR | Status: AC
  Filled 2022-09-21: qty 2

## 2022-09-21 MED ORDER — MIDAZOLAM HCL 2 MG/2ML IJ SOLN
2.0000 mg | INTRAMUSCULAR | Status: AC
  Administered 2022-09-21: 2 mg via INTRAVENOUS

## 2022-09-21 MED ORDER — SODIUM CHLORIDE 0.9% FLUSH
3.0000 mL | Freq: Two times a day (BID) | INTRAVENOUS | Status: DC
  Administered 2022-09-21 – 2022-09-30 (×17): 3 mL via INTRAVENOUS

## 2022-09-21 MED ORDER — LACTATED RINGERS IV BOLUS
1000.0000 mL | Freq: Once | INTRAVENOUS | Status: AC
  Administered 2022-09-21: 1000 mL via INTRAVENOUS

## 2022-09-21 MED ORDER — SODIUM CHLORIDE 0.9 % IV SOLN
12.0000 mg | Freq: Once | INTRAVENOUS | Status: AC
  Administered 2022-09-21: 12 mg via INTRAVENOUS
  Filled 2022-09-21: qty 12

## 2022-09-21 MED ORDER — IOHEXOL 350 MG/ML SOLN
100.0000 mL | Freq: Once | INTRAVENOUS | Status: AC | PRN
  Administered 2022-09-21: 100 mL via INTRAVENOUS

## 2022-09-21 MED ORDER — FENTANYL CITRATE (PF) 100 MCG/2ML IJ SOLN
INTRAMUSCULAR | Status: AC
  Filled 2022-09-21: qty 2

## 2022-09-21 MED ORDER — SODIUM CHLORIDE 0.9% FLUSH: 3.0000 mL | INTRAVENOUS | Status: DC | PRN

## 2022-09-21 MED ORDER — IOHEXOL 300 MG/ML  SOLN
50.0000 mL | Freq: Once | INTRAMUSCULAR | Status: AC | PRN
  Administered 2022-09-21: 10 mL

## 2022-09-21 MED ORDER — SODIUM CHLORIDE (PF) 0.9 % IJ SOLN
INTRAMUSCULAR | Status: AC
  Filled 2022-09-21: qty 50

## 2022-09-21 MED ORDER — LACTATED RINGERS IV SOLN: INTRAVENOUS | Status: DC

## 2022-09-21 MED ORDER — MIDAZOLAM HCL 2 MG/2ML IJ SOLN
2.0000 mg | Freq: Once | INTRAMUSCULAR | Status: AC | PRN
  Administered 2022-09-22: 2 mg via INTRAVENOUS
  Filled 2022-09-21: qty 2

## 2022-09-21 MED ORDER — CHLORHEXIDINE GLUCONATE CLOTH 2 % EX PADS
6.0000 | MEDICATED_PAD | Freq: Every day | CUTANEOUS | Status: DC
  Administered 2022-09-21 – 2022-10-04 (×13): 6 via TOPICAL

## 2022-09-21 MED ORDER — FENTANYL CITRATE PF 50 MCG/ML IJ SOSY
50.0000 ug | PREFILLED_SYRINGE | Freq: Once | INTRAMUSCULAR | Status: AC
  Administered 2022-09-21: 50 ug via INTRAVENOUS
  Filled 2022-09-21: qty 1

## 2022-09-21 MED ORDER — LIDOCAINE HCL 1 % IJ SOLN
INTRAMUSCULAR | Status: AC
  Filled 2022-09-21: qty 20

## 2022-09-21 MED ORDER — BUPROPION HCL ER (XL) 150 MG PO TB24
150.0000 mg | ORAL_TABLET | Freq: Every day | ORAL | Status: DC
  Administered 2022-09-22 – 2022-10-05 (×14): 150 mg via ORAL
  Filled 2022-09-21 (×15): qty 1

## 2022-09-21 MED ORDER — SODIUM CHLORIDE 0.9 % IV SOLN: INTRAVENOUS | Status: DC

## 2022-09-21 MED ORDER — POLYETHYLENE GLYCOL 3350 17 G PO PACK: 17.0000 g | PACK | Freq: Every day | ORAL | Status: DC | PRN

## 2022-09-21 MED ORDER — ONDANSETRON HCL 4 MG/2ML IJ SOLN
4.0000 mg | Freq: Once | INTRAMUSCULAR | Status: AC
  Administered 2022-09-21: 4 mg via INTRAVENOUS
  Filled 2022-09-21: qty 2

## 2022-09-21 MED ORDER — FENTANYL CITRATE PF 50 MCG/ML IJ SOSY
12.5000 ug | PREFILLED_SYRINGE | INTRAMUSCULAR | Status: DC | PRN
  Administered 2022-09-21 – 2022-09-22 (×7): 12.5 ug via INTRAVENOUS
  Filled 2022-09-21 (×7): qty 1

## 2022-09-21 MED ORDER — ASPIRIN 81 MG PO CHEW
324.0000 mg | CHEWABLE_TABLET | Freq: Once | ORAL | Status: AC
  Administered 2022-09-21: 324 mg via ORAL
  Filled 2022-09-21: qty 4

## 2022-09-21 MED ORDER — LIDOCAINE HCL (PF) 1 % IJ SOLN
20.0000 mL | Freq: Once | INTRAMUSCULAR | Status: AC
  Administered 2022-09-21: 5 mL via INTRADERMAL

## 2022-09-21 MED ORDER — OXIDIZED CELLULOSE EX PADS
1.0000 | MEDICATED_PAD | Freq: Once | CUTANEOUS | Status: AC
  Administered 2022-09-21: 1 via TOPICAL
  Filled 2022-09-21: qty 1

## 2022-09-21 MED ORDER — ONDANSETRON HCL 4 MG/2ML IJ SOLN
4.0000 mg | Freq: Three times a day (TID) | INTRAMUSCULAR | Status: DC
  Filled 2022-09-21: qty 2

## 2022-09-21 MED ORDER — DOCUSATE SODIUM 100 MG PO CAPS
100.0000 mg | ORAL_CAPSULE | Freq: Two times a day (BID) | ORAL | Status: DC | PRN
  Administered 2022-09-22 – 2022-10-01 (×8): 100 mg via ORAL
  Filled 2022-09-21 (×8): qty 1

## 2022-09-21 MED ORDER — ACETAMINOPHEN 500 MG PO TABS
1000.0000 mg | ORAL_TABLET | Freq: Three times a day (TID) | ORAL | Status: AC | PRN
  Administered 2022-09-21 – 2022-09-22 (×2): 1000 mg via ORAL
  Filled 2022-09-21 (×2): qty 2

## 2022-09-21 MED ORDER — METOCLOPRAMIDE HCL 5 MG/ML IJ SOLN
10.0000 mg | Freq: Once | INTRAMUSCULAR | Status: AC
  Administered 2022-09-21: 10 mg via INTRAVENOUS
  Filled 2022-09-21: qty 2

## 2022-09-21 MED ORDER — ONDANSETRON HCL 4 MG/2ML IJ SOLN
4.0000 mg | Freq: Three times a day (TID) | INTRAMUSCULAR | Status: DC
  Administered 2022-09-21 – 2022-09-24 (×8): 4 mg via INTRAVENOUS
  Filled 2022-09-21 (×8): qty 2

## 2022-09-21 MED ORDER — ONDANSETRON HCL 4 MG PO TABS
4.0000 mg | ORAL_TABLET | Freq: Three times a day (TID) | ORAL | Status: DC | PRN
  Administered 2022-09-24: 4 mg via ORAL
  Filled 2022-09-21: qty 1

## 2022-09-21 MED ORDER — IOHEXOL 300 MG/ML  SOLN
70.0000 mL | Freq: Once | INTRAMUSCULAR | Status: AC | PRN
  Administered 2022-09-21: 70 mL via INTRAVENOUS

## 2022-09-21 MED ORDER — SODIUM CHLORIDE 0.9 % IV SOLN: 250.0000 mL | INTRAVENOUS | Status: DC | PRN

## 2022-09-21 MED ORDER — ONDANSETRON HCL 4 MG/2ML IJ SOLN
INTRAMUSCULAR | Status: AC
  Filled 2022-09-21: qty 2

## 2022-09-21 MED ORDER — HEPARIN (PORCINE) 25000 UT/250ML-% IV SOLN
1300.0000 [IU]/h | INTRAVENOUS | Status: DC
  Administered 2022-09-21: 1300 [IU]/h via INTRAVENOUS
  Filled 2022-09-21: qty 250

## 2022-09-21 MED ORDER — TAMSULOSIN HCL 0.4 MG PO CAPS
0.4000 mg | ORAL_CAPSULE | Freq: Every day | ORAL | Status: DC
  Administered 2022-09-22 – 2022-10-05 (×14): 0.4 mg via ORAL
  Filled 2022-09-21 (×15): qty 1

## 2022-09-21 MED ORDER — HEPARIN BOLUS VIA INFUSION
2500.0000 [IU] | Freq: Once | INTRAVENOUS | Status: AC
  Administered 2022-09-21: 2500 [IU] via INTRAVENOUS
  Filled 2022-09-21: qty 2500

## 2022-09-21 MED ORDER — OXYBUTYNIN CHLORIDE 5 MG PO TABS
5.0000 mg | ORAL_TABLET | Freq: Three times a day (TID) | ORAL | Status: DC
  Administered 2022-09-21 – 2022-10-05 (×41): 5 mg via ORAL
  Filled 2022-09-21 (×41): qty 1

## 2022-09-21 NOTE — Sedation Documentation (Signed)
Main PA 58/13 Mean 31

## 2022-09-21 NOTE — Consult Note (Signed)
NAME:  [name], MRN:  [id number], DOB:  01-Jun-1940, LOS: 0 ADMISSION DATE:  09/21/2022, CONSULTATION DATE:  8/14 REFERRING MD:  Criss Alvine, CHIEF COMPLAINT:  Pulmonary emboli    History of Present Illness:  82 year old male w/ h/o afib (not on systemic AC), fibromyalgia, basal cell carcinoma w/ recent skin cancer excisions (~ 2 wks prior) on Back and left face. Just recovered from COVID w/ three negative tests recorded by the pt on  8/9. Was at his son's house on 8/10 when noted new onset of marked exertional shortness of breath to the point he could not really assist even with helping with hanging paintings without feels very short of breath and light headed to the point he felt he might pass out. These symptoms continued to worsen so he called EMS on 8/14 w/ cc: shortness of breath, new abd discomfort, new nausea, and dizziness. Initial pulse ox 70s. Placed on supplemental oxygen. CT chest showing bilateral pulmonary emboli w/ RV/LV ratio of > 1. Initial trop I: 112, BNP 775.7 Critical care asked to eval.   Pertinent  Medical History  Afib (not on [facility] & not formally seen by cards)  fibromyalgia, ureteral stones, inguinal hernia, enlarged prostate, basal cell carcinoma s/p recent excision   Significant Hospital Events: Including procedures, antibiotic start and stop dates in addition to other pertinent events   8/14 admitted w/ submassive PE 1. Bilateral pulmonary emboli within the distal main pulmonary arteries extending into all lobes with CT findings of right heart strain.2. Two distal left ureteral stones measuring up to 6 mm. No hydronephrosis. Multiple additional bilateral nonobstructing renal stones. 3. Unchanged small right inguinal hernia contains a loop of nonobstructed small bowel and multiple moderate midline anterior abdominal fat containing hernias.4. 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 5. Aortic Atherosclerosis (ICD10-I70.0). Started on IV heparin. Initial  simplified PESI (score 3, high risk 8.9% ).   Interim History / Subjective:  Resting does have SOB   Objective   Blood pressure (!) 135/94, pulse 86, temperature 97.6 F (36.4 C), temperature source Oral, resp. rate (!) 23, height 6' (1.829 m), weight 76.2 kg, SpO2 (!) 89%.       No intake or output data in the 24 hours ending 09/21/22 1127 Filed Weights   09/21/22 1037  Weight: 76.2 kg    Examination: General: 82 year old male resting in bed. Able to speak in full sentences but currently on 8 lpm sats 87-89% HENT: NCAT no JVD  Lungs: clear decreased bases  Cardiovascular: RRR Abdomen: soft not tender  Extremities: warm no sig edema brisk CR  Neuro: awake and alert no focal def  GU: voids   Resolved Hospital Problem list     Assessment & Plan:  Submassive bilateral pulmonary emboli seemingly provoked by recent COVID infection and also has h/o skin cancer  Elevated BNP and trop I  Plan Cont IV heparin for now  Admit to ICU STAT echo pending Will get LEUS to r/o DVT IR consulted for possible catheter directed thrombolysis   Acute hypoxic respiratory failure 2/2 acute submassive PE Plan Supplemental oxygen Pulse ox Wean as needed  Mild AKI. Does have h/o ureteral stones but no evidence of hydro  Plan Ck UNa and cr  IVFs Renal dose meds Strict I&O  Mild leukocytosis ->likely reactive Plan Monitor   H/o skin cancer w/ recent excision (~ 2 weeks prior to admit)  Plan Remove sutures left facial wound (has been present for  2 weeks) Will need f/u w/ plastic after  H/o chronic pain and fibromyalgia  Plan PRN as needed   Best Practice (right click and "Reselect all SmartList Selections" daily)   Diet/type: Regular consistency (see orders) DVT prophylaxis: systemic heparin GI prophylaxis: N/A Lines: N/A Foley:  N/A Code Status:  full code Last date of multidisciplinary goals of care discussion [pending ]  Labs   CBC: Recent Labs  Lab 09/21/22 0834   WBC 10.6*  NEUTROABS 7.9*  HGB 14.3  HCT 42.3  MCV 97.7  PLT 241    Basic Metabolic Panel: Recent Labs  Lab 09/21/22 0834  NA 134*  K 3.8  CL 98  CO2 28  GLUCOSE 125*  BUN 18  CREATININE 1.29*  CALCIUM 9.4   GFR: Estimated Creatinine Clearance: 48.4 mL/min (A) (by C-G formula based on SCr of 1.29 mg/dL (H)). Recent Labs  Lab 09/21/22 0834 09/21/22 0840  WBC 10.6*  --   LATICACIDVEN  --  1.1    Liver Function Tests: Recent Labs  Lab 09/21/22 0834  AST 27  ALT 19  ALKPHOS 102  BILITOT 1.1  PROT 6.4*  ALBUMIN 3.5   Recent Labs  Lab 09/21/22 0834  LIPASE 23   No results for input(s): "AMMONIA" in the last 168 hours.  ABG No results found for: "PHART", "PCO2ART", "PO2ART", "HCO3", "TCO2", "ACIDBASEDEF", "O2SAT"   Coagulation Profile: Recent Labs  Lab 09/21/22 1052  INR 1.1    Cardiac Enzymes: No results for input(s): "CKTOTAL", "CKMB", "CKMBINDEX", "TROPONINI" in the last 168 hours.  HbA1C: No results found for: "HGBA1C"  CBG: No results for input(s): "GLUCAP" in the last 168 hours.  Review of Systems:   Review of Systems  Constitutional:  Negative for chills, diaphoresis and fever.  HENT: Negative.    Eyes: Negative.   Respiratory:  Positive for shortness of breath.   Cardiovascular:  Positive for chest pain. Negative for leg swelling.  Gastrointestinal:  Positive for nausea.  Genitourinary: Negative.   Musculoskeletal: Negative.   Skin:        Wound dressings intact   Neurological:  Positive for dizziness.  Endo/Heme/Allergies: Negative.   Psychiatric/Behavioral: Negative.       Past Medical History:  He,  has a past medical history of Arthritis, Brain tumor (HCC), Colonic mass (2001), Fatigue, Fibromyalgia (1990's ), Frequent PVCs, Generalized body aches, H/O inguinal hernia, Hernia, umbilical, History of kidney stones, Hypertension, NSVT (nonsustained ventricular tachycardia) (HCC), Premature atrial contractions, Prolonged Q-T  interval on ECG, and Wears dentures.   Surgical History:   Past Surgical History:  Procedure Laterality Date   BRAIN TUMOR EXCISION  age 26   CARDIAC CATHETERIZATION N/A 04/09/2015   Procedure: Left Heart Cath and Coronary Angiography;  Surgeon: Kathleene Hazel, MD;  Location: [facility] INVASIVE CV LAB;  Service: Cardiovascular;  Laterality: N/A;   COLON SURGERY  2001   COLON RESECTION FOR GROWTH - NOT CANCER   CYSTOSCOPY WITH RETROGRADE PYELOGRAM, URETEROSCOPY AND STENT PLACEMENT Left 10/05/2012   Procedure: CYSTOSCOPY WITH RETROGRADE PYELOGRAM, URETEROSCOPY AND STENT PLACEMENT;  Surgeon: Sebastian Ache, MD;  Location: [facility];  Service: Urology;  Laterality: Left;   CYSTOSCOPY/RETROGRADE/URETEROSCOPY/STONE EXTRACTION WITH BASKET Right 10/05/2012   Procedure: CYSTOSCOPY/RETROGRADE/URETEROSCOPY/STONE EXTRACTION WITH BASKET;  Surgeon: Sebastian Ache, MD;  Location: [facility];  Service: Urology;  Laterality: Right;   CYSTOSCOPY/RETROGRADE/URETEROSCOPY/STONE EXTRACTION WITH BASKET Bilateral 10/24/2012   Procedure: CYSTOSCOPY/RETROGRADE/URETEROSCOPY/STONE EXTRACTION WITH BASKET;  Surgeon: Sebastian Ache, MD;  Location: [facility];  Service: Urology;  Laterality:  Bilateral;  with bilateral stent placement   HOLMIUM LASER APPLICATION Right 10/05/2012   Procedure: HOLMIUM LASER APPLICATION;  Surgeon: Sebastian Ache, MD;  Location: [facility];  Service: Urology;  Laterality: Right;   HOLMIUM LASER APPLICATION Bilateral 10/24/2012   Procedure: HOLMIUM LASER APPLICATION;  Surgeon: Sebastian Ache, MD;  Location: [facility];  Service: Urology;  Laterality: Bilateral;   LITHOTRIPSY     TONSILLECTOMY  age 49's   and adenoids     Social History:   reports that he has never smoked. He has never used smokeless tobacco. He reports current alcohol use of about 7.0 standard drinks of alcohol per week. He reports that he does not currently use drugs.   Family History:  His family history includes Colon cancer in his father; Heart  disease in his father.   Allergies Allergies  Allergen Reactions   Other Other (See Comments)    Mangos - caused a severe rash   Prozac [Fluoxetine Hcl]     Made him feel "loopy"   Sulfa Antibiotics Hives   Vicodin [Hydrocodone-Acetaminophen] Itching    Tolerates oxycodone   Erythromycin Rash    Many years ago an oral antibiotic caused a rash(wife and patient think it was erythromycin but they are not positive).     Home Medications  Prior to Admission medications   Medication Sig Start Date End Date Taking? Authorizing Provider  aspirin EC 81 MG tablet Take 1 tablet (81 mg total) by mouth daily. 04/02/15  Yes Lars Masson, MD  buPROPion (WELLBUTRIN XL) 150 MG 24 hr tablet TAKE 1 TABLET BY MOUTH EVERY DAY 07/12/22  Yes Deeann Saint, MD  carvedilol (COREG) 3.125 MG tablet TAKE 1 TABLET BY MOUTH TWICE A DAY 09/15/22  Yes Nafziger, Kandee Keen, NP  ibuprofen (ADVIL) 600 MG tablet Take 600 mg by mouth every 8 (eight) hours.   Yes [provider]  lisinopril (ZESTRIL) 10 MG tablet TAKE 1 TABLET BY MOUTH EVERY DAY 08/31/22  Yes Nafziger, Kandee Keen, NP  meclizine (ANTIVERT) 25 MG tablet Take 1 tablet (25 mg total) by mouth 3 (three) times daily as needed for dizziness. 10/21/21  Yes Nafziger, Kandee Keen, NP  Misc Natural Products (OSTEO BI-FLEX ADV JOINT SHIELD PO) Take 2 capsules by mouth daily.   Yes [provider]  Multiple Vitamin (MULTIVITAMIN) tablet Take 1 tablet by mouth daily.   Yes [provider]  ondansetron (ZOFRAN) 4 MG tablet Take 1 tablet (4 mg total) by mouth every 8 (eight) hours as needed for nausea or vomiting. 10/21/21  Yes Nafziger, Kandee Keen, NP  tamsulosin (FLOMAX) 0.4 MG CAPS capsule Take 1 capsule (0.4 mg total) by mouth daily. 01/14/22  Yes Nafziger, Kandee Keen, NP  atorvastatin (LIPITOR) 10 MG tablet Take 1 tablet (10 mg total) by mouth daily. Patient not taking: Reported on 09/21/2022 10/22/21   Shirline Frees, NP  Cranberry 1000 MG CAPS Take 1,000 mg by mouth  daily.  Patient not taking: Reported on 09/21/2022    [provider]  ondansetron (ZOFRAN-ODT) 4 MG disintegrating tablet Take 1 tablet (4 mg total) by mouth every 8 (eight) hours as needed for nausea or vomiting. Patient not taking: Reported on 09/21/2022 12/14/21   Shirline Frees, NP     Critical care time: 45 minutes      Simonne Martinet ACNP-BC [facility] Pager # [phone] OR # [phone] if no answer

## 2022-09-21 NOTE — Progress Notes (Signed)
ANTICOAGULATION CONSULT NOTE - Initial Consult  Pharmacy Consult for Heparin Indication: pulmonary embolus  Allergies  Allergen Reactions   Other Other (See Comments)    Mangos - caused a severe rash   Prozac [Fluoxetine Hcl]     Made him feel "loopy"   Sulfa Antibiotics Hives   Vicodin [Hydrocodone-Acetaminophen] Itching    Tolerates oxycodone   Erythromycin Rash    Many years ago an oral antibiotic caused a rash(wife and patient think it was erythromycin but they are not positive).    Patient Measurements: Height: 6' (182.9 cm) Weight: 76.2 kg (168 lb) IBW/kg (Calculated) : 77.6 Heparin Dosing Weight: 76.2 kg  Vital Signs: Temp: 97.6 F (36.4 C) (08/14 0828) Temp Source: Oral (08/14 0828) BP: 138/101 (08/14 1030) Pulse Rate: 90 (08/14 1030)  Labs: Recent Labs    09/21/22 0834  HGB 14.3  HCT 42.3  PLT 241  CREATININE 1.29*  TROPONINIHS 112*    Estimated Creatinine Clearance: 48.4 mL/min (A) (by C-G formula based on SCr of 1.29 mg/dL (H)).   Medical History: Past Medical History:  Diagnosis Date   Arthritis    HANDS AND PT STATES HIS ARMS HURT   Brain tumor [facility])    age 55 - brain tumor removed left- benign - occas slight headaches since the surgery   Colonic mass 2001   Fatigue    x 1 week o as of 02-17-2020   Fibromyalgia 1990's    Frequent PVCs    Generalized body aches    x 1 week as of 02-17-2020   H/O inguinal hernia    right side   Hernia, umbilical    History of kidney stones    HX OF MULTIPLE KIDNEY STONES AND  CURRENTLY HAS BILATERAL RENAL STONES CAUSING ABDOMINAL AND BACK PAIN   Hypertension    high blood pressure readings    NSVT (nonsustained ventricular tachycardia) (HCC)    Premature atrial contractions    Prolonged Q-T interval on ECG    hx of none currently   Wears dentures    full dentures    Medications:  No anticoagulation PTA  Assessment: 82 year old male presented to the ED with dizziness, shortness of breath and  abdominal pain.  He has a history of hypertension, palpitations, fibromyalgia. Patient reported that last week he had cough and shortness of breath and had a positive home COVID test.     8/14 CT Angio Chest showed bilateral pulmonary emboli within the distal main pulmonary arteries extending into all lobes with CT findings of right heart strain.  Baseline Labs, 09/21/22  Hgb = 14.3  g/dL Plt = 409 K/uL  Goal of Therapy:  Heparin level 0.3-0.7 units/ml Monitor platelets by anticoagulation protocol: Yes   Plan:  -  Ordered baseline aPTT and INR -  Give 2500 units bolus x 1 -  Start heparin infusion at 1300 units/hr -  Check heparin level in 8 hours -  Check heparin level and CBC daily -  Monitor for signs/symptoms of bleeding   Tacy Learn, PharmD Clinical Pharmacist 09/21/2022 11:04 AM

## 2022-09-21 NOTE — Progress Notes (Signed)
ANTICOAGULATION CONSULT NOTE  Pharmacy Consult for Heparin Indication: pulmonary embolus  Allergies  Allergen Reactions   Other Other (See Comments)    Mangos - caused a severe rash   Prozac [Fluoxetine Hcl]     Made him feel "loopy"   Sulfa Antibiotics Hives   Vicodin [Hydrocodone-Acetaminophen] Itching    Tolerates oxycodone   Erythromycin Rash    Many years ago an oral antibiotic caused a rash(wife and patient think it was erythromycin but they are not positive).    Patient Measurements: Height: 6' (182.9 cm) Weight: 76.2 kg (168 lb) IBW/kg (Calculated) : 77.6 Heparin Dosing Weight: 76.2 kg  Vital Signs: Temp: 97.8 F (36.6 C) (08/14 1200) Temp Source: Oral (08/14 1200) BP: 151/95 (08/14 1900) Pulse Rate: 30 (08/14 1816)  Labs: Recent Labs    09/21/22 0834 09/21/22 1036 09/21/22 1052 09/21/22 1923  HGB 14.3  --   --  14.0  HCT 42.3  --   --  42.6  PLT 241  --   --  232  APTT  --   --  27  --   LABPROT  --   --  14.6  --   INR  --   --  1.1  --   HEPARINUNFRC  --   --   --  0.64  CREATININE 1.29*  --   --   --   TROPONINIHS 112* 101*  --   --     Estimated Creatinine Clearance: 48.4 mL/min (A) (by C-G formula based on SCr of 1.29 mg/dL (H)).   Medical History: Past Medical History:  Diagnosis Date   Arthritis    HANDS AND PT STATES HIS ARMS HURT   Brain tumor [facility])    age 36 - brain tumor removed left- benign - occas slight headaches since the surgery   Colonic mass 2001   Fatigue    x 1 week o as of 02-17-2020   Fibromyalgia 1990's    Frequent PVCs    Generalized body aches    x 1 week as of 02-17-2020   H/O inguinal hernia    right side   Hernia, umbilical    History of kidney stones    HX OF MULTIPLE KIDNEY STONES AND  CURRENTLY HAS BILATERAL RENAL STONES CAUSING ABDOMINAL AND BACK PAIN   Hypertension    high blood pressure readings    NSVT (nonsustained ventricular tachycardia) (HCC)    Premature atrial contractions    Prolonged Q-T  interval on ECG    hx of none currently   Wears dentures    full dentures    Medications:  No anticoagulation PTA  Assessment: 82 year old male presented to the ED with dizziness, shortness of breath and abdominal pain.  He has a history of hypertension, palpitations, fibromyalgia. Patient reported that last week he had cough and shortness of breath and had a positive home COVID test.     8/14 CT Angio Chest showed bilateral pulmonary emboli within the distal main pulmonary arteries extending into all lobes with CT findings of right heart strain.  Baseline Labs, 09/21/22  Hgb = 14.3  g/dL Plt = 284 K/uL aPTT 27 sec INR 1.1  09/21/2022 @1710  Started on bilateral pulmonary artery infusion of alteplase, 1 mg/hr through each catheter x 12 hours for total TPA dose = 24 mg First heparin level = 0.64 - therapeutic 8 hours after 1500 unit bolus & drip at 1300 units/hr Fibrinogen 285   Hg 14 - stable PLT  232 stable Per notes pt having some hematuria after foley removed  Goal of Therapy:  Heparin level 0.3-0.7 units/ml Monitor platelets by anticoagulation protocol: Yes   Plan:  -  continue heparin infusion at 1300 units/hr -  heparin level, CBC & fibrinogen q6h x 3 more times per catheter directed thrombolysis protocol -  Check heparin level and CBC daily -  Monitor for signs/symptoms of bleeding   Herby Abraham, Pharm.D Use secure chat for questions 09/21/2022 8:58 PM

## 2022-09-21 NOTE — Progress Notes (Signed)
Bilateral lower extremity venous duplex has been completed. Preliminary results can be found in CV Proc through chart review.  Results were given to the patient's nurse, Cami.  09/21/22 3:06 PM Olen Cordial RVT

## 2022-09-21 NOTE — ED Triage Notes (Addendum)
EMS reports from home c/o abdominal pain, dizziness and headache. Pt states also has fibromyalgia. Hx of afib untreated. Dx with Covid last week, since tested negative. On EMS arrival at home SPO2 @ 74. Arrived on Non rebreather at 15ltrs @ 97 Spo2. Not normally on O2.  BP 110/74 HR 88 RR 20 Spo2 97 @ 15ltrs non rebreather. CBG 148

## 2022-09-21 NOTE — ED Notes (Signed)
Patient transported to CT 

## 2022-09-21 NOTE — ED Notes (Signed)
ED TO INPATIENT HANDOFF REPORT  ED Nurse Name and Phone #: Lona Kettle Name/Age/Gender [name] 82 y.o. male Room/Bed: WA15/WA15  Code Status   Code Status: Full Code  Home/SNF/Other Home Patient oriented to: self, place, time, and situation Is this baseline? Yes   Triage Complete: Triage complete  Chief Complaint Pulmonary emboli [facility]) [I26.99]  Triage Note EMS reports from home c/o abdominal pain, dizziness and headache. Pt states also has fibromyalgia. Hx of afib untreated. Dx with Covid last week, since tested negative. On EMS arrival at home SPO2 @ 74. Arrived on Non rebreather at 15ltrs @ 97 Spo2. Not normally on O2.  BP 110/74 HR 88 RR 20 Spo2 97 @ 15ltrs non rebreather. CBG 148    Allergies Allergies  Allergen Reactions   Other Other (See Comments)    Mangos - caused a severe rash   Prozac [Fluoxetine Hcl]     Made him feel "loopy"   Sulfa Antibiotics Hives   Vicodin [Hydrocodone-Acetaminophen] Itching    Tolerates oxycodone   Erythromycin Rash    Many years ago an oral antibiotic caused a rash(wife and patient think it was erythromycin but they are not positive).    Level of Care/Admitting Diagnosis ED Disposition     ED Disposition  Admit   Condition  --   Comment  Hospital Area: [facility] [[id number]]  Level of Care: ICU [6]  May admit patient to [facility] or [facility] if equivalent level of care is available:: No  Covid Evaluation: Symptomatic Person Under Investigation (PUI) or recent exposure (last 10 days) *Testing Required*  Diagnosis: Pulmonary emboli [facility]) [[id number]]  Admitting Physician: Tomma Lightning [[id number]]  Attending Physician: Tomma Lightning [phone]  Certification:: I certify this patient will need inpatient services for at least 2 midnights  Expected Medical Readiness: 09/23/2022          B Medical/Surgery History Past Medical History:  Diagnosis Date   Arthritis    HANDS AND PT  STATES HIS ARMS HURT   Brain tumor [facility])    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 - occas slight headaches since the surgery   Colonic mass 2001   Fatigue    x 1 week o as of 02-17-2020   Fibromyalgia 1990's    Frequent PVCs    Generalized body aches    x 1 week as of 02-17-2020   H/O inguinal hernia    right side   Hernia, umbilical    History of kidney stones    HX OF MULTIPLE KIDNEY STONES AND  CURRENTLY HAS BILATERAL RENAL STONES CAUSING ABDOMINAL AND BACK PAIN   Hypertension    high blood pressure readings    NSVT (nonsustained ventricular tachycardia) (HCC)    Premature atrial contractions    Prolonged Q-T interval on ECG    hx of none currently   Wears dentures    full dentures   Past Surgical History:  Procedure Laterality Date   BRAIN TUMOR EXCISION  age 60   CARDIAC CATHETERIZATION N/A 04/09/2015   Procedure: Left Heart Cath and Coronary Angiography;  Surgeon: Kathleene Hazel, MD;  Location: [facility] INVASIVE CV LAB;  Service: Cardiovascular;  Laterality: N/A;   COLON SURGERY  2001   COLON RESECTION FOR GROWTH - NOT CANCER   CYSTOSCOPY WITH RETROGRADE PYELOGRAM, URETEROSCOPY AND STENT PLACEMENT Left 10/05/2012   Procedure: CYSTOSCOPY WITH RETROGRADE PYELOGRAM, URETEROSCOPY AND STENT PLACEMENT;  Surgeon: Sebastian Ache, MD;  Location: [facility]  ORS;  Service: Urology;  Laterality: Left;   CYSTOSCOPY/RETROGRADE/URETEROSCOPY/STONE EXTRACTION WITH BASKET Right 10/05/2012   Procedure: CYSTOSCOPY/RETROGRADE/URETEROSCOPY/STONE EXTRACTION WITH BASKET;  Surgeon: Sebastian Ache, MD;  Location: [facility] ORS;  Service: Urology;  Laterality: Right;   CYSTOSCOPY/RETROGRADE/URETEROSCOPY/STONE EXTRACTION WITH BASKET Bilateral 10/24/2012   Procedure: CYSTOSCOPY/RETROGRADE/URETEROSCOPY/STONE EXTRACTION WITH BASKET;  Surgeon: Sebastian Ache, MD;  Location: [facility] ORS;  Service: Urology;  Laterality: Bilateral;  with bilateral stent placement   HOLMIUM LASER APPLICATION Right 10/05/2012   Procedure: HOLMIUM  LASER APPLICATION;  Surgeon: Sebastian Ache, MD;  Location: [facility] ORS;  Service: Urology;  Laterality: Right;   HOLMIUM LASER APPLICATION Bilateral 10/24/2012   Procedure: HOLMIUM LASER APPLICATION;  Surgeon: Sebastian Ache, MD;  Location: [facility] ORS;  Service: Urology;  Laterality: Bilateral;   LITHOTRIPSY     TONSILLECTOMY  age 48's   and adenoids     A IV Location/Drains/Wounds Patient Lines/Drains/Airways Status     Active Line/Drains/Airways     Name Placement date Placement time Site Days   Peripheral IV 09/21/22 20 G Anterior;Distal;Left;Upper Arm 09/21/22  0833  Arm  less than 1   Peripheral IV 09/21/22 20 G Anterior;Right Forearm 09/21/22  1336  Forearm  less than 1   Ureteral Drain/Stent Right ureter 5 Fr. 10/24/12  --  Right ureter  3619   Ureteral Drain/Stent Left ureter 5 Fr. 10/24/12  1504  Left ureter  3619            Intake/Output Last 24 hours No intake or output data in the 24 hours ending 09/21/22 1338  Labs/Imaging Results for orders placed or performed during the hospital encounter of 09/21/22 (from the past 48 hour(s))  Comprehensive metabolic panel     Status: Abnormal   Collection Time: 09/21/22  8:34 AM  Result Value Ref Range   Sodium 134 (L) 135 - 145 mmol/L   Potassium 3.8 3.5 - 5.1 mmol/L   Chloride 98 98 - 111 mmol/L   CO2 28 22 - 32 mmol/L   Glucose, Bld 125 (H) 70 - 99 mg/dL    Comment: Glucose reference range applies only to samples taken after fasting for at least 8 hours.   BUN 18 8 - 23 mg/dL   Creatinine, Ser 2.72 (H) 0.61 - 1.24 mg/dL   Calcium 9.4 8.9 - 53.6 mg/dL   Total Protein 6.4 (L) 6.5 - 8.1 g/dL   Albumin 3.5 3.5 - 5.0 g/dL   AST 27 15 - 41 U/L   ALT 19 0 - 44 U/L   Alkaline Phosphatase 102 38 - 126 U/L   Total Bilirubin 1.1 0.3 - 1.2 mg/dL   GFR, Estimated 56 (L) >60 mL/min    Comment: (NOTE) Calculated using the CKD-EPI Creatinine Equation (2021)    Anion gap 8 5 - 15    Comment: Performed at [facility],  [address], [place], [place] [address]  CBC with Differential     Status: Abnormal   Collection Time: 09/21/22  8:34 AM  Result Value Ref Range   WBC 10.6 (H) 4.0 - 10.5 K/uL   RBC 4.33 4.22 - 5.81 MIL/uL   Hemoglobin 14.3 13.0 - 17.0 g/dL   HCT 47.4 25.9 - 56.3 %   MCV 97.7 80.0 - 100.0 fL   MCH 33.0 26.0 - 34.0 pg   MCHC 33.8 30.0 - 36.0 g/dL   RDW 87.5 64.3 - 32.9 %   Platelets 241 150 - 400 K/uL   nRBC 0.0 0.0 - 0.2 %  Neutrophils Relative % 75 %   Neutro Abs 7.9 (H) 1.7 - 7.7 K/uL   Lymphocytes Relative 13 %   Lymphs Abs 1.3 0.7 - 4.0 K/uL   Monocytes Relative 11 %   Monocytes Absolute 1.2 (H) 0.1 - 1.0 K/uL   Eosinophils Relative 1 %   Eosinophils Absolute 0.1 0.0 - 0.5 K/uL   Basophils Relative 0 %   Basophils Absolute 0.0 0.0 - 0.1 K/uL   Immature Granulocytes 0 %   Abs Immature Granulocytes 0.03 0.00 - 0.07 K/uL    Comment: Performed at [facility], [address], [place], [place] [address]  Lipase, blood     Status: None   Collection Time: 09/21/22  8:34 AM  Result Value Ref Range   Lipase 23 11 - 51 U/L    Comment: Performed at [facility] [address] [place], [place] [address]  Troponin I (High Sensitivity)     Status: Abnormal   Collection Time: 09/21/22  8:34 AM  Result Value Ref Range   Troponin I (High Sensitivity) 112 (HH) <18 ng/L    Comment: CRITICAL RESULT CALLED TO, READ BACK BY AND VERIFIED WITH ,M. RN AT 4696 09/21/22 MULLINS,T (NOTE) Elevated high sensitivity troponin I (hsTnI) values and significant  changes across serial measurements may suggest ACS but many other  chronic and acute conditions are known to elevate hsTnI results.  Refer to the "Links" section for chest pain algorithms and additional  guidance. Performed at [facility], [address], [place], [place] [address]   I-Stat CG4 Lactic Acid     Status: None   Collection Time: 09/21/22  8:40 AM  Result  Value Ref Range   Lactic Acid, Venous 1.1 0.5 - 1.9 mmol/L  Brain natriuretic peptide     Status: Abnormal   Collection Time: 09/21/22  8:47 AM  Result Value Ref Range   B Natriuretic Peptide 775.7 (H) 0.0 - 100.0 pg/mL    Comment: Performed at [facility], [address], [place], [place] [address]  Troponin I (High Sensitivity)     Status: Abnormal   Collection Time: 09/21/22 10:36 AM  Result Value Ref Range   Troponin I (High Sensitivity) 101 (HH) <18 ng/L    Comment: CRITICAL VALUE NOTED. VALUE IS CONSISTENT WITH PREVIOUSLY REPORTED/CALLED VALUE (NOTE) Elevated high sensitivity troponin I (hsTnI) values and significant  changes across serial measurements may suggest ACS but many other  chronic and acute conditions are known to elevate hsTnI results.  Refer to the "Links" section for chest pain algorithms and additional  guidance. Performed at [facility], [address] [place], [place] [address]   APTT     Status: None   Collection Time: 09/21/22 10:52 AM  Result Value Ref Range   aPTT 27 24 - 36 seconds    Comment: Performed at [facility], [address] [place], [place] [address]  Protime-INR     Status: None   Collection Time: 09/21/22 10:52 AM  Result Value Ref Range   Prothrombin Time 14.6 11.4 - 15.2 seconds   INR 1.1 0.8 - 1.2    Comment: (NOTE) INR goal varies based on device and disease states. Performed at [facility], [address] [place], [place] [address]    ECHOCARDIOGRAM COMPLETE  Result Date: 09/21/2022    ECHOCARDIOGRAM REPORT   Patient Name:   [name] Date of Exam: 09/21/2022 Medical Rec #:  [id number]         Height:       72.0 in Accession #:    [id number]        Weight:       168.0 lb Date of Birth:  August 09, 1940          BSA:          1.978 m Patient Age:    82 years          BP:           129/94 mmHg Patient Gender: M                 HR:           82 bpm. Exam  Location:  Inpatient Procedure: 2D Echo, Cardiac Doppler and Color Doppler Indications:    Pulmonary Embolus I26.09  History:        Patient has prior history of Echocardiogram examinations, most                 recent 04/02/2015. Risk Factors:Hypertension.  Sonographer:    Harriette Bouillon RDCS Referring Phys: [id number] ADEWALE A OLALERE IMPRESSIONS  1. Left ventricular ejection fraction, by estimation, is 50 to 55%. The left ventricle has low normal function. The left ventricle has no regional wall motion abnormalities. There is mild left ventricular hypertrophy. Left ventricular diastolic parameters are consistent with Grade I diastolic dysfunction (impaired relaxation).  2. Right ventricular systolic function is severely reduced. The right ventricular size is moderately enlarged. There is severely elevated pulmonary artery systolic pressure.  3. Right atrial size was severely dilated.  4. The mitral valve is normal in structure. Mild mitral valve regurgitation. No evidence of mitral stenosis.  5. The aortic valve is tricuspid. Aortic valve regurgitation is not visualized. No aortic stenosis is present.  6. The inferior vena cava is dilated in size with <50% respiratory variability, suggesting right atrial pressure of 15 mmHg. Comparison(s): No prior Echocardiogram. FINDINGS  Left Ventricle: Left ventricular ejection fraction, by estimation, is 50 to 55%. The left ventricle has low normal function. The left ventricle has no regional wall motion abnormalities. The left ventricular internal cavity size was normal in size. There is mild left ventricular hypertrophy. Left ventricular diastolic parameters are consistent with Grade I diastolic dysfunction (impaired relaxation). Right Ventricle: The right ventricular size is moderately enlarged. Right ventricular systolic function is severely reduced. There is severely elevated pulmonary artery systolic pressure. The tricuspid regurgitant velocity is 3.37 m/s, and with an  assumed right atrial pressure of 15 mmHg, the estimated right ventricular systolic pressure is 60.4 mmHg. Left Atrium: Left atrial size was normal in size. Right Atrium: Right atrial size was severely dilated. Pericardium: There is no evidence of pericardial effusion. Mitral Valve: The mitral valve is normal in structure. Mild mitral valve regurgitation. No evidence of mitral valve stenosis. Tricuspid Valve: The tricuspid valve is normal in structure. Tricuspid valve regurgitation is mild . No evidence of tricuspid stenosis. Aortic Valve: The aortic valve is tricuspid. Aortic valve regurgitation is not visualized. No aortic stenosis is present. Pulmonic Valve: The pulmonic valve was normal in structure. Pulmonic valve regurgitation is mild. No evidence of pulmonic stenosis. Aorta: The aortic root is normal in size and structure. Venous: The inferior vena cava is dilated in size with less than 50% respiratory variability, suggesting right atrial pressure of 15 mmHg. IAS/Shunts: No atrial level shunt detected by color flow Doppler.  LEFT VENTRICLE PLAX 2D LVIDd:  4.80 cm   Diastology LVIDs:         4.00 cm   LV e' medial:  3.92 cm/s LV PW:         1.20 cm   LV e' lateral: 8.59 cm/s LV IVS:        1.20 cm LVOT diam:     2.20 cm LV SV:         56 LV SV Index:   28 LVOT Area:     3.80 cm  RIGHT VENTRICLE            IVC RV S prime:     6.42 cm/s  IVC diam: 2.90 cm TAPSE (M-mode): 1.4 cm LEFT ATRIUM           Index        RIGHT ATRIUM           Index LA diam:      3.60 cm 1.82 cm/m   RA Area:     30.90 cm LA Vol (A2C): 56.5 ml 28.56 ml/m  RA Volume:   126.00 ml 63.69 ml/m LA Vol (A4C): 31.3 ml 15.82 ml/m  AORTIC VALVE             PULMONIC VALVE LVOT Vmax:   82.30 cm/s  PR End Diast Vel: 1.43 msec LVOT Vmean:  54.800 cm/s LVOT VTI:    0.147 m  AORTA Ao Root diam: 3.70 cm Ao Asc diam:  3.80 cm TRICUSPID VALVE TR Peak grad:   45.4 mmHg TR Vmax:        337.00 cm/s  SHUNTS Systemic VTI:  0.15 m Systemic Diam: 2.20  cm Olga Millers MD Electronically signed by Olga Millers MD Signature Date/Time: 09/21/2022/12:33:12 PM    Final    CT ABDOMEN PELVIS W CONTRAST  Result Date: 09/21/2022 CLINICAL DATA:  Abdominal pain, dizziness, and headache. Recent diagnosis COVID with new O2 requirement EXAM: CT ANGIOGRAPHY CHEST CT ABDOMEN AND PELVIS WITH CONTRAST TECHNIQUE: Multidetector CT imaging of the chest was performed using the standard protocol during bolus administration of intravenous contrast. Multiplanar CT image reconstructions and MIPs were obtained to evaluate the vascular anatomy. Multidetector CT imaging of the abdomen and pelvis was performed using the standard protocol during bolus administration of intravenous contrast. RADIATION DOSE REDUCTION: This exam was performed according to the departmental dose-optimization program which includes automated exposure control, adjustment of the mA and/or kV according to patient size and/or use of iterative reconstruction technique. CONTRAST:  OMNIPAQUE IOHEXOL 350 MG/ML SOLN COMPARISON:  Chest radiograph dated 09/21/2022, CT abdomen and pelvis dated 09/14/2019 FINDINGS: CTA CHEST FINDINGS Cardiovascular: The study is high quality for the evaluation of pulmonary embolism. Filling defects within the distal main pulmonary arteries bilaterally extending into all lobes. Main pulmonary artery measures 3.5 cm. Right ventricle to left ventricle ratio > 1. No significant pericardial fluid/thickening. Mediastinum/Nodes: Imaged thyroid gland without nodules meeting criteria for imaging follow-up by size. Normal esophagus. No pathologically enlarged axillary, supraclavicular, mediastinal, or hilar lymph nodes. Lungs/Pleura: The central airways are patent. No focal consolidation. No pneumothorax. No pleural effusion. Musculoskeletal: No acute or abnormal lytic or blastic osseous lesions. Multilevel degenerative changes of the thoracic spine. Review of the MIP images confirms the above  findings. CT ABDOMEN and PELVIS FINDINGS Hepatobiliary: No focal hepatic lesions. No intra or extrahepatic biliary ductal dilation. Normal gallbladder. Pancreas: No focal lesions or main ductal dilation. Unchanged focus of coarse calcification in the pancreatic tail Spleen: Normal in size without focal abnormality.  Adrenals/Urinary Tract: No adrenal nodules. No suspicious renal mass or hydronephrosis. Two distal left ureteral stones measuring up to 6 mm. Multiple additional bilateral nonobstructing stones. No focal bladder wall thickening. Stomach/Bowel: Normal appearance of the stomach. Small diverticulum arising from the second portion. No evidence of bowel wall thickening, distention, or inflammatory changes. Normal appendix. Vascular/Lymphatic: Aortic atherosclerosis. No enlarged abdominal or pelvic lymph nodes. 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 Other: No free fluid, fluid collection, or free air. Musculoskeletal: No acute or abnormal lytic or blastic osseous lesions. Multilevel degenerative changes of the lumbar spine. Unchanged small right inguinal hernia contains a loop of nonobstructed small bowel. Unchanged multiple moderate midline anterior abdominal fat containing hernias. IMPRESSION: 1. Bilateral pulmonary emboli within the distal main pulmonary arteries extending into all lobes with CT findings of right heart strain. 2. Two distal left ureteral stones measuring up to 6 mm. No hydronephrosis. Multiple additional bilateral nonobstructing renal stones. 3. Unchanged small right inguinal hernia contains a loop of nonobstructed small bowel and multiple moderate midline anterior abdominal fat containing hernias. 4. 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 5. Aortic Atherosclerosis (ICD10-I70.0). Critical Value/emergent results were called by telephone at the time of interpretation on 09/21/2022 at 10:55 am to provider Pricilla Loveless , who verbally  acknowledged these results. Electronically Signed   By: Agustin Cree M.D.   On: 09/21/2022 11:00   CT Angio Chest PE W and/or Wo Contrast  Result Date: 09/21/2022 CLINICAL DATA:  Abdominal pain, dizziness, and headache. Recent diagnosis COVID with new O2 requirement EXAM: CT ANGIOGRAPHY CHEST CT ABDOMEN AND PELVIS WITH CONTRAST TECHNIQUE: Multidetector CT imaging of the chest was performed using the standard protocol during bolus administration of intravenous contrast. Multiplanar CT image reconstructions and MIPs were obtained to evaluate the vascular anatomy. Multidetector CT imaging of the abdomen and pelvis was performed using the standard protocol during bolus administration of intravenous contrast. RADIATION DOSE REDUCTION: This exam was performed according to the departmental dose-optimization program which includes automated exposure control, adjustment of the mA and/or kV according to patient size and/or use of iterative reconstruction technique. CONTRAST:  OMNIPAQUE IOHEXOL 350 MG/ML SOLN COMPARISON:  Chest radiograph dated 09/21/2022, CT abdomen and pelvis dated 09/14/2019 FINDINGS: CTA CHEST FINDINGS Cardiovascular: The study is high quality for the evaluation of pulmonary embolism. Filling defects within the distal main pulmonary arteries bilaterally extending into all lobes. Main pulmonary artery measures 3.5 cm. Right ventricle to left ventricle ratio > 1. No significant pericardial fluid/thickening. Mediastinum/Nodes: Imaged thyroid gland without nodules meeting criteria for imaging follow-up by size. Normal esophagus. No pathologically enlarged axillary, supraclavicular, mediastinal, or hilar lymph nodes. Lungs/Pleura: The central airways are patent. No focal consolidation. No pneumothorax. No pleural effusion. Musculoskeletal: No acute or abnormal lytic or blastic osseous lesions. Multilevel degenerative changes of the thoracic spine. Review of the MIP images confirms the above findings. CT  ABDOMEN and PELVIS FINDINGS Hepatobiliary: No focal hepatic lesions. No intra or extrahepatic biliary ductal dilation. Normal gallbladder. Pancreas: No focal lesions or main ductal dilation. Unchanged focus of coarse calcification in the pancreatic tail Spleen: Normal in size without focal abnormality. Adrenals/Urinary Tract: No adrenal nodules. No suspicious renal mass or hydronephrosis. Two distal left ureteral stones measuring up to 6 mm. Multiple additional bilateral nonobstructing stones. No focal bladder wall thickening. Stomach/Bowel: Normal appearance of the stomach. Small diverticulum arising from the second portion. No evidence of bowel wall thickening, distention, or inflammatory changes. Normal appendix. Vascular/Lymphatic: Aortic atherosclerosis.  No enlarged abdominal or pelvic lymph nodes. 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 Other: No free fluid, fluid collection, or free air. Musculoskeletal: No acute or abnormal lytic or blastic osseous lesions. Multilevel degenerative changes of the lumbar spine. Unchanged small right inguinal hernia contains a loop of nonobstructed small bowel. Unchanged multiple moderate midline anterior abdominal fat containing hernias. IMPRESSION: 1. Bilateral pulmonary emboli within the distal main pulmonary arteries extending into all lobes with CT findings of right heart strain. 2. Two distal left ureteral stones measuring up to 6 mm. No hydronephrosis. Multiple additional bilateral nonobstructing renal stones. 3. Unchanged small right inguinal hernia contains a loop of nonobstructed small bowel and multiple moderate midline anterior abdominal fat containing hernias. 4. 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 5. Aortic Atherosclerosis (ICD10-I70.0). Critical Value/emergent results were called by telephone at the time of interpretation on 09/21/2022 at 10:55 am to provider Pricilla Loveless , who verbally acknowledged these  results. Electronically Signed   By: Agustin Cree M.D.   On: 09/21/2022 11:00   DG Chest Portable 1 View  Result Date: 09/21/2022 CLINICAL DATA:  Hypoxia. EXAM: PORTABLE CHEST 1 VIEW COMPARISON:  09/30/2019. FINDINGS: Clear lungs. Stable cardiac and mediastinal contours with tortuosity of the thoracic aorta. No pleural effusion or pneumothorax. Visualized bones and upper abdomen are unremarkable. IMPRESSION: No evidence of acute cardiopulmonary disease. Electronically Signed   By: Orvan Falconer M.D.   On: 09/21/2022 09:25    Pending Labs Unresulted Labs (From admission, onward)     Start     Ordered   09/22/22 0500  CBC  Daily,   R      09/21/22 1113   09/22/22 0500  Heparin level (unfractionated)  Daily,   R      09/21/22 1114   09/22/22 0500  Basic metabolic panel  Tomorrow morning,   R        09/21/22 1233   09/22/22 0500  Magnesium  Tomorrow morning,   R        09/21/22 1233   09/22/22 0500  Brain natriuretic peptide  Daily,   R      09/21/22 1234   09/21/22 1930  Heparin level (unfractionated)  Once-Timed,   URGENT        09/21/22 1113   09/21/22 1236  Sodium, urine, random  Once,   R        09/21/22 1235   09/21/22 1236  Creatinine, urine, random  Once,   R        09/21/22 1235   09/21/22 1233  Procalcitonin  Once,   R       References:    Procalcitonin Lower Respiratory Tract Infection AND Sepsis Procalcitonin Algorithm   09/21/22 1233   09/21/22 0844  Urinalysis, w/ Reflex to Culture (Infection Suspected) -Urine, Clean Catch  Once,   URGENT       Question:  Specimen Source  Answer:  Urine, Clean Catch   09/21/22 0844   09/21/22 0844  Culture, blood (routine x 2)  BLOOD CULTURE X 2,   R (with STAT occurrences)      09/21/22 0844            Vitals/Pain Today's Vitals   09/21/22 1037 09/21/22 1117 09/21/22 1200 09/21/22 1300  BP:  (!) 135/94 (!) 139/108 (!) 155/116  Pulse:  86 80 84  Resp:  (!) 23 16 11   Temp:   97.8 F (36.6 C)   TempSrc:  Oral   SpO2:  (!) 89%  92% 90%  Weight: 76.2 kg     Height: 6' (1.829 m)     PainSc:        Isolation Precautions No active isolations  Medications Medications  heparin ADULT infusion 100 units/mL (25000 units/236mL) (1,300 Units/hr Intravenous New Bag/Given 09/21/22 1116)  buPROPion (WELLBUTRIN XL) 24 hr tablet 150 mg (has no administration in time range)  ondansetron (ZOFRAN) tablet 4 mg (has no administration in time range)  tamsulosin (FLOMAX) capsule 0.4 mg (has no administration in time range)  docusate sodium (COLACE) capsule 100 mg (has no administration in time range)  polyethylene glycol (MIRALAX / GLYCOLAX) packet 17 g (has no administration in time range)  lactated ringers infusion (has no administration in time range)  iohexol (OMNIPAQUE) 300 MG/ML solution 70 mL (has no administration in time range)  lactated ringers bolus 1,000 mL (0 mLs Intravenous Stopped 09/21/22 1021)  fentaNYL (SUBLIMAZE) injection 50 mcg (50 mcg Intravenous Given 09/21/22 0854)  ondansetron (ZOFRAN) injection 4 mg (4 mg Intravenous Given 09/21/22 0906)  iohexol (OMNIPAQUE) 350 MG/ML injection 100 mL (100 mLs Intravenous Contrast Given 09/21/22 1015)  aspirin chewable tablet 324 mg (324 mg Oral Given 09/21/22 1034)  heparin bolus via infusion 2,500 Units (2,500 Units Intravenous Bolus from Bag 09/21/22 1114)    Mobility walks     Focused Assessments Pulmonary Assessment Handoff:  Lung sounds: L Breath Sounds: Diminished R Breath Sounds: Diminished O2 Device: Nasal Cannula O2 Flow Rate (L/min): 6 L/min    R Recommendations: See Admitting Provider Note  Report given to:   Additional Notes: PE on Hep drip

## 2022-09-21 NOTE — Procedures (Signed)
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   Procedure: Placement of bilateral pulmonary artery infusion catheters  Findings: Main pulmonary artery pressure 58/13 mmHg, mean 31.  Bilateral pulmonary artery catheters placed.  Complications: None     EBL: Minimal  Plan: Plan for 1 mg/hr TPA through each catheter for 12 hours, total dose 24 mg.   R. Lowella Dandy, MD  Pager: [phone]

## 2022-09-21 NOTE — Progress Notes (Signed)
eLink Physician-Brief Progress Note Patient Name: [name] DOB: March 11, 1940 MRN: [id number]   Date of Service  09/21/2022  HPI/Events of Note  Patient with gross hematuria, he is receiving catheter directed thrombolysis for PE but Fibrinogen is within normal limits.  eICU Interventions  Will consult urology.        Migdalia Dk 09/21/2022, 8:34 PM

## 2022-09-21 NOTE — Progress Notes (Signed)
Foley removed, patient having about 100cc of bloody output from his penis, c/o urgency, elink called to notify about bloody output

## 2022-09-21 NOTE — Consult Note (Cosign Needed Addendum)
Urology Consult Note   Requesting Attending Physician:  Tomma Lightning, MD Service Providing Consult: Urology  Consulting Attending: Dr Alfredo Martinez   Reason for Consult:  Hematuria   HPI: [name] is seen in consultation for reasons noted above at the request of Tomma Lightning, MD for the evaluation of hematuria.  This is a 82 y.o. male with history of hypertension, palpitations, fibromyalgia, kidney stones who was recently positive for COVID.  He presented with shortness of breath was found to have a PE.  He was started on intrapulmonary tPA as well as a heparin drip.  At the time of arrival to the ED, Foley catheter was placed.  The Foley catheter was removed today because of pain.  Patient then started to void "frank blood".  Patient was voiding small amounts.  He was also having significant abdominal pain.  On my assessment, patient's abdomen was very distended.  Hematuria catheter was placed at the bedside with a 24 [place] three-way Foley catheter.  30 cc was placed in the balloon.  Immediate return of over 600 cc of [place] colored urine.  The Foley was then irrigated with 1-1/2 L of fluid.  No significant clot burden.  However urine was very more low in color.  CBI was then started.  Once the Foley catheter was Foley on traction, the urine began to clear on a wide open drip.  We were able to slow down the CBI to a moderate drip for a pink-tinged/clear urine.  Prior to this, patient was not on any blood thinners.  He is not a smoker.  No family history of kidney cancer, bladder cancer.  No significant risk factor for that.  CT scan was performed with evidence of a stone in the distal left ureter.  No hydronephrosis. Urinalysis was negative for infection.  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 - occas slight headaches since the  surgery   Colonic mass 2001   Fatigue    x 1 week o as of 02-17-2020   Fibromyalgia 1990's    Frequent PVCs    Generalized body aches    x 1 week as of 02-17-2020   H/O inguinal hernia    right side   Hernia, umbilical    History of kidney stones    HX OF MULTIPLE KIDNEY STONES AND  CURRENTLY HAS BILATERAL RENAL STONES CAUSING ABDOMINAL AND BACK PAIN   Hypertension    high blood pressure readings    NSVT (nonsustained ventricular tachycardia) (HCC)    Premature atrial contractions    Prolonged Q-T interval on ECG    hx of none currently   Wears dentures    full dentures    Past Surgical History:  Past Surgical History:  Procedure Laterality Date   BRAIN TUMOR EXCISION  age 79   CARDIAC CATHETERIZATION N/A 04/09/2015   Procedure: Left Heart Cath and Coronary Angiography;  Surgeon: Kathleene Hazel, MD;  Location: [facility] INVASIVE CV LAB;  Service: Cardiovascular;  Laterality: N/A;   COLON SURGERY  2001   COLON RESECTION FOR GROWTH - NOT CANCER   CYSTOSCOPY WITH RETROGRADE PYELOGRAM, URETEROSCOPY AND STENT PLACEMENT Left 10/05/2012   Procedure: CYSTOSCOPY WITH RETROGRADE PYELOGRAM, URETEROSCOPY AND STENT PLACEMENT;  Surgeon: Sebastian Ache, MD;  Location: [facility] ORS;  Service: Urology;  Laterality:  Left;   CYSTOSCOPY/RETROGRADE/URETEROSCOPY/STONE EXTRACTION WITH BASKET Right 10/05/2012   Procedure: CYSTOSCOPY/RETROGRADE/URETEROSCOPY/STONE EXTRACTION WITH BASKET;  Surgeon: Sebastian Ache, MD;  Location: [facility];  Service: Urology;  Laterality: Right;   CYSTOSCOPY/RETROGRADE/URETEROSCOPY/STONE EXTRACTION WITH BASKET Bilateral 10/24/2012   Procedure: CYSTOSCOPY/RETROGRADE/URETEROSCOPY/STONE EXTRACTION WITH BASKET;  Surgeon: Sebastian Ache, MD;  Location: [facility];  Service: Urology;  Laterality: Bilateral;  with bilateral stent placement   HOLMIUM LASER APPLICATION Right 10/05/2012   Procedure: HOLMIUM LASER APPLICATION;  Surgeon: Sebastian Ache, MD;  Location: [facility];  Service: Urology;   Laterality: Right;   HOLMIUM LASER APPLICATION Bilateral 10/24/2012   Procedure: HOLMIUM LASER APPLICATION;  Surgeon: Sebastian Ache, MD;  Location: [facility];  Service: Urology;  Laterality: Bilateral;   LITHOTRIPSY     TONSILLECTOMY  age 76's   and adenoids    Medication: Current Facility-Administered Medications  Medication Dose Route Frequency Provider Last Rate Last Admin   0.9 %  sodium chloride infusion  250 mL Intravenous PRN Richarda Overlie, MD       Melene Muller ON 09/22/2022] 0.9 %  sodium chloride infusion   Intravenous Continuous Richarda Overlie, MD       Melene Muller ON 09/22/2022] 0.9 %  sodium chloride infusion   Intravenous Continuous Richarda Overlie, MD       acetaminophen (TYLENOL) tablet 1,000 mg  1,000 mg Oral Q8H PRN Migdalia Dk, MD   1,000 mg at 09/21/22 2143   buPROPion (WELLBUTRIN XL) 24 hr tablet 150 mg  150 mg Oral Daily Simonne Martinet, NP       Chlorhexidine Gluconate Cloth 2 % PADS 6 each  6 each Topical Daily Olalere, Adewale A, MD   6 each at 09/21/22 1431   docusate sodium (COLACE) capsule 100 mg  100 mg Oral BID PRN Simonne Martinet, NP       fentaNYL (SUBLIMAZE) injection 12.5 mcg  12.5 mcg Intravenous Q2H PRN Simonne Martinet, NP   12.5 mcg at 09/21/22 2130   heparin ADULT infusion 100 units/mL (25000 units/26mL)  1,300 Units/hr Intravenous Continuous Simonne Martinet, NP   Stopping previously hung infusion at 09/21/22 2205   lactated ringers infusion   Intravenous Continuous Simonne Martinet, NP 125 mL/hr at 09/21/22 1900 Infusion Verify at 09/21/22 1900   midazolam (VERSED) injection 2 mg  2 mg Intravenous Once PRN Karie Fetch P, DO       ondansetron [facility]) injection 4 mg  4 mg Intravenous Q8H Olalere, Adewale A, MD   4 mg at 09/21/22 1812   ondansetron (ZOFRAN) tablet 4 mg  4 mg Oral Q8H PRN Simonne Martinet, NP       oxybutynin (DITROPAN) tablet 5 mg  5 mg Oral TID Karie Fetch P, DO       polyethylene glycol (MIRALAX / GLYCOLAX) packet 17 g  17 g Oral Daily PRN Simonne Martinet, NP       sodium chloride flush (NS) 0.9 % injection 3 mL  3 mL Intravenous Q12H Richarda Overlie, MD   3 mL at 09/21/22 2207   sodium chloride flush (NS) 0.9 % injection 3 mL  3 mL Intravenous PRN Richarda Overlie, MD       tamsulosin (FLOMAX) capsule 0.4 mg  0.4 mg Oral Daily Simonne Martinet, NP        Allergies: Allergies  Allergen Reactions   Other Other (See Comments)    Mang[place] - caused a severe rash   Prozac [Fluoxetine Hcl]     Made  him feel "loopy"   Sulfa Antibiotics Hives   Vicodin [Hydrocodone-Acetaminophen] Itching    Tolerates oxycodone   Erythromycin Rash    Many years ago an oral antibiotic caused a rash(wife and patient think it was erythromycin but they are not positive).    Social History: Social History   Tobacco Use   Smoking status: Never   Smokeless tobacco: Never  Vaping Use   Vaping status: Never Used  Substance Use Topics   Alcohol use: Yes    Alcohol/week: 7.0 standard drinks of alcohol    Types: 7 Standard drinks or equivalent per week    Comment: 1 drink per day   Drug use: Not Currently    Family History Family History  Problem Relation Age of Onset   Colon cancer Father    Heart disease Father        quad bypass    Review of Systems 10 systems were reviewed and are negative except as noted specifically in the HPI.  Objective   Vital signs in last 24 hours: BP (!) 185/121   Pulse (!) 29   Temp 97.9 F (36.6 C) (Oral)   Resp (!) 22   Ht 6' (1.829 m)   Wt 76.2 kg   SpO2 98%   BMI 22.78 kg/m   Physical Exam General: NAD, A&O, resting, appropriate HEENT: Green/AT, EOMI, MMM Pulmonary: Normal work of breathing on nasal cannula. Cardiovascular: HDS, adequate peripheral perfusion Abdomen: Firm, distended abdomen. GU: Bladder palpable.  Blood coming from meatus with clots. Extremities: warm and well perfused Neuro: Appropriate Most Recent Labs: Lab Results  Component Value Date   WBC 14.3 (H) 09/21/2022   HGB 14.0 09/21/2022    HCT 42.6 09/21/2022   PLT 232 09/21/2022    Lab Results  Component Value Date   NA 134 (L) 09/21/2022   K 3.8 09/21/2022   CL 98 09/21/2022   CO2 28 09/21/2022   BUN 18 09/21/2022   CREATININE 1.29 (H) 09/21/2022   CALCIUM 9.4 09/21/2022   MG 2.0 07/26/2016    Lab Results  Component Value Date   INR 1.1 09/21/2022   APTT 27 09/21/2022     Urine Culture: @[id number] (laburin,org,[id number],[id number])@   IMAGING: VAS [place] LOWER EXTREMITY VENOUS (DVT)  Result Date: 09/21/2022  Lower Venous DVT Study Patient Name:  [name]  Date of Exam:   09/21/2022 Medical Rec #: [id number]          Accession #:    [id number] Date of Birth: June 01, 1940           Patient Gender: M Patient Age:   39 years Exam Location:  [facility] Procedure:      VAS [place] LOWER EXTREMITY VENOUS (DVT) Referring Phys: Zenia Resides --------------------------------------------------------------------------------  Indications: Pulmonary embolism.  Risk Factors: Confirmed PE. Anticoagulation: Heparin. Comparison Study: No prior studies. Performing Technologist: Chanda Busing RVT  Examination Guidelines: A complete evaluation includes B-mode imaging, spectral Doppler, color Doppler, and power Doppler as needed of all accessible portions of each vessel. Bilateral testing is considered an integral part of a complete examination. Limited examinations for reoccurring indications may be performed as noted. The reflux portion of the exam is performed with the patient in reverse Trendelenburg.  +---------+---------------+---------+-----------+----------+--------------+ RIGHT    CompressibilityPhasicitySpontaneityPropertiesThrombus Aging +---------+---------------+---------+-----------+----------+--------------+ CFV      Full           Yes      Yes                                 +---------+---------------+---------+-----------+----------+--------------+  SFJ      Full                                                         +---------+---------------+---------+-----------+----------+--------------+ FV Prox  Partial        No       No                   Acute          +---------+---------------+---------+-----------+----------+--------------+ FV Mid   Full                                                        +---------+---------------+---------+-----------+----------+--------------+ FV DistalFull                                                        +---------+---------------+---------+-----------+----------+--------------+ PFV      Full                                                        +---------+---------------+---------+-----------+----------+--------------+ POP      Full           Yes      Yes                                 +---------+---------------+---------+-----------+----------+--------------+ PTV      Full                                                        +---------+---------------+---------+-----------+----------+--------------+ PERO     Full                                                        +---------+---------------+---------+-----------+----------+--------------+   +---------+---------------+---------+-----------+----------+--------------+ LEFT     CompressibilityPhasicitySpontaneityPropertiesThrombus Aging +---------+---------------+---------+-----------+----------+--------------+ CFV      Full           Yes      No                                  +---------+---------------+---------+-----------+----------+--------------+ SFJ      Full                                                        +---------+---------------+---------+-----------+----------+--------------+  FV Prox  Full                                                        +---------+---------------+---------+-----------+----------+--------------+ FV Mid   Full                                                         +---------+---------------+---------+-----------+----------+--------------+ FV DistalFull                                                        +---------+---------------+---------+-----------+----------+--------------+ PFV      Full                                                        +---------+---------------+---------+-----------+----------+--------------+ POP      Full           Yes      Yes                                 +---------+---------------+---------+-----------+----------+--------------+ PTV      Full                                                        +---------+---------------+---------+-----------+----------+--------------+ PERO     Full                                                        +---------+---------------+---------+-----------+----------+--------------+    Summary: RIGHT: - Findings consistent with acute deep vein thrombosis involving the right femoral vein. - No cystic structure found in the popliteal fossa.  LEFT: - There is no evidence of deep vein thrombosis in the lower extremity.  - No cystic structure found in the popliteal fossa.  *See table(s) above for measurements and observations.    Preliminary    CT HEAD W & WO CONTRAST ( )  Result Date: 09/21/2022 CLINICAL DATA:  Melanoma.  Pulmonary embolus. EXAM: CT HEAD WITHOUT AND WITH CONTRAST TECHNIQUE: Contiguous axial images were obtained from the base of the skull through the vertex without and with intravenous contrast. RADIATION DOSE REDUCTION: This exam was performed according to the departmental dose-optimization program which includes automated exposure control, adjustment of the mA and/or kV according to patient size and/or use of iterative reconstruction technique. CONTRAST:  70mL OMNIPAQUE IOHEXOL 300 MG/ML  SOLN COMPARISON:  CT head without contrast and MR head without contrast 08/10/2019 FINDINGS: Brain: No acute infarct, hemorrhage, or mass  lesion is present. Scattered  subcortical white matter hypoattenuation is present bilaterally. A Deep brain nuclei are within normal limits. The ventricles are of normal size. No significant extraaxial fluid collection is present. Vascular: Minimal calcifications are present within the cavernous internal carotid arteries. Contrast is present within the blood vessels. No asymmetric hyperdensity is present. Skull: Calvarium is intact. No focal lytic or blastic lesions are present. No significant extracranial soft tissue lesion is present. Sinuses/Orbits: The paranasal sinuses and mastoid air cells are clear. The globes and orbits are within normal limits. IMPRESSION: 1. No acute or focal lesion to suggest metastatic disease. 2. No acute hemorrhage. 3. Scattered subcortical white matter hypoattenuation bilaterally. 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 Electronically Signed   By: Marin Roberts M.D.   On: 09/21/2022 14:42   ECHOCARDIOGRAM COMPLETE  Result Date: 09/21/2022    ECHOCARDIOGRAM REPORT   Patient Name:   [name] Date of Exam: 09/21/2022 Medical Rec #:  [id number]         Height:       72.0 in Accession #:    [id number]        Weight:       168.0 lb Date of Birth:  12/05/1940          BSA:          1.978 m Patient Age:    82 years          BP:           129/94 mmHg Patient Gender: M                 HR:           82 bpm. Exam Location:  Inpatient Procedure: 2D Echo, Cardiac Doppler and Color Doppler Indications:    Pulmonary Embolus I26.09  History:        Patient has prior history of Echocardiogram examinations, most                 recent 04/02/2015. Risk Factors:Hypertension.  Sonographer:    Harriette Bouillon RDCS Referring Phys: [id number] ADEWALE A OLALERE IMPRESSIONS  1. Left ventricular ejection fraction, by estimation, is 50 to 55%. The left ventricle has low normal function. The left ventricle has no regional wall motion abnormalities. There is mild left ventricular hypertrophy. Left  ventricular diastolic parameters are consistent with Grade I diastolic dysfunction (impaired relaxation).  2. Right ventricular systolic function is severely reduced. The right ventricular size is moderately enlarged. There is severely elevated pulmonary artery systolic pressure.  3. Right atrial size was severely dilated.  4. The mitral valve is normal in structure. Mild mitral valve regurgitation. No evidence of mitral stenosis.  5. The aortic valve is tricuspid. Aortic valve regurgitation is not visualized. No aortic stenosis is present.  6. The inferior vena cava is dilated in size with <50% respiratory variability, suggesting right atrial pressure of 15 mmHg. Comparison(s): No prior Echocardiogram. FINDINGS  Left Ventricle: Left ventricular ejection fraction, by estimation, is 50 to 55%. The left ventricle has low normal function. The left ventricle has no regional wall motion abnormalities. The left ventricular internal cavity size was normal in size. There is mild left ventricular hypertrophy. Left ventricular diastolic parameters are consistent with Grade I diastolic dysfunction (impaired relaxation). Right Ventricle: The right ventricular size is moderately enlarged. Right ventricular systolic function is severely reduced. There is severely elevated pulmonary artery systolic pressure. The tricuspid regurgitant velocity is 3.37 m/s, and with  an assumed right atrial pressure of 15 mmHg, the estimated right ventricular systolic pressure is 60.4 mmHg. Left Atrium: Left atrial size was normal in size. Right Atrium: Right atrial size was severely dilated. Pericardium: There is no evidence of pericardial effusion. Mitral Valve: The mitral valve is normal in structure. Mild mitral valve regurgitation. No evidence of mitral valve stenosis. Tricuspid Valve: The tricuspid valve is normal in structure. Tricuspid valve regurgitation is mild . No evidence of tricuspid stenosis. Aortic Valve: The aortic valve is  tricuspid. Aortic valve regurgitation is not visualized. No aortic stenosis is present. Pulmonic Valve: The pulmonic valve was normal in structure. Pulmonic valve regurgitation is mild. No evidence of pulmonic stenosis. Aorta: The aortic root is normal in size and structure. Venous: The inferior vena cava is dilated in size with less than 50% respiratory variability, suggesting right atrial pressure of 15 mmHg. IAS/Shunts: No atrial level shunt detected by color flow Doppler.  LEFT VENTRICLE PLAX 2D LVIDd:         4.80 cm   Diastology LVIDs:         4.00 cm   LV e' medial:  3.92 cm/s LV PW:         1.20 cm   LV e' lateral: 8.59 cm/s LV IVS:        1.20 cm LVOT diam:     2.20 cm LV SV:         56 LV SV Index:   28 LVOT Area:     3.80 cm  RIGHT VENTRICLE            IVC RV S prime:     6.42 cm/s  IVC diam: 2.90 cm TAPSE (M-mode): 1.4 cm LEFT ATRIUM           Index        RIGHT ATRIUM           Index LA diam:      3.60 cm 1.82 cm/m   RA Area:     30.90 cm LA Vol (A2C): 56.5 ml 28.56 ml/m  RA Volume:   126.00 ml 63.69 ml/m LA Vol (A4C): 31.3 ml 15.82 ml/m  AORTIC VALVE             PULMONIC VALVE LVOT Vmax:   82.30 cm/s  PR End Diast Vel: 1.43 msec LVOT Vmean:  54.800 cm/s LVOT VTI:    0.147 m  AORTA Ao Root diam: 3.70 cm Ao Asc diam:  3.80 cm TRICUSPID VALVE TR Peak grad:   45.4 mmHg TR Vmax:        337.00 cm/s  SHUNTS Systemic VTI:  0.15 m Systemic Diam: 2.20 cm Olga Millers MD Electronically signed by Olga Millers MD Signature Date/Time: 09/21/2022/12:33:12 PM    Final    CT ABDOMEN PELVIS W CONTRAST  Result Date: 09/21/2022 CLINICAL DATA:  Abdominal pain, dizziness, and headache. Recent diagnosis COVID with new O2 requirement EXAM: CT ANGIOGRAPHY CHEST CT ABDOMEN AND PELVIS WITH CONTRAST TECHNIQUE: Multidetector CT imaging of the chest was performed using the standard protocol during bolus administration of intravenous contrast. Multiplanar CT image reconstructions and MIPs were obtained to evaluate the  vascular anatomy. Multidetector CT imaging of the abdomen and pelvis was performed using the standard protocol during bolus administration of intravenous contrast. RADIATION DOSE REDUCTION: This exam was performed according to the departmental dose-optimization program which includes automated exposure control, adjustment of the mA and/or kV according to patient size and/or use of iterative reconstruction technique. CONTRAST:  OMNIPAQUE IOHEXOL 350 MG/ML SOLN COMPARISON:  Chest radiograph dated 09/21/2022, CT abdomen and pelvis dated 09/14/2019 FINDINGS: CTA CHEST FINDINGS Cardiovascular: The study is high quality for the evaluation of pulmonary embolism. Filling defects within the distal main pulmonary arteries bilaterally extending into all lobes. Main pulmonary artery measures 3.5 cm. Right ventricle to left ventricle ratio > 1. No significant pericardial fluid/thickening. Mediastinum/Nodes: Imaged thyroid gland without nodules meeting criteria for imaging follow-up by size. Normal esophagus. No pathologically enlarged axillary, supraclavicular, mediastinal, or hilar lymph nodes. Lungs/Pleura: The central airways are patent. No focal consolidation. No pneumothorax. No pleural effusion. Musculoskeletal: No acute or abnormal lytic or blastic osseous lesions. Multilevel degenerative changes of the thoracic spine. Review of the MIP images confirms the above findings. CT ABDOMEN and PELVIS FINDINGS Hepatobiliary: No focal hepatic lesions. No intra or extrahepatic biliary ductal dilation. Normal gallbladder. Pancreas: No focal lesions or main ductal dilation. Unchanged focus of coarse calcification in the pancreatic tail Spleen: Normal in size without focal abnormality. Adrenals/Urinary Tract: No adrenal nodules. No suspicious renal mass or hydronephrosis. Two distal left ureteral stones measuring up to 6 mm. Multiple additional bilateral nonobstructing stones. No focal bladder wall thickening. Stomach/Bowel:  Normal appearance of the stomach. Small diverticulum arising from the second portion. No evidence of bowel wall thickening, distention, or inflammatory changes. Normal appendix. Vascular/Lymphatic: Aortic atherosclerosis. No enlarged abdominal or pelvic lymph nodes. 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 Other: No free fluid, fluid collection, or free air. Musculoskeletal: No acute or abnormal lytic or blastic osseous lesions. Multilevel degenerative changes of the lumbar spine. Unchanged small right inguinal hernia contains a loop of nonobstructed small bowel. Unchanged multiple moderate midline anterior abdominal fat containing hernias. IMPRESSION: 1. Bilateral pulmonary emboli within the distal main pulmonary arteries extending into all lobes with CT findings of right heart strain. 2. Two distal left ureteral stones measuring up to 6 mm. No hydronephrosis. Multiple additional bilateral nonobstructing renal stones. 3. Unchanged small right inguinal hernia contains a loop of nonobstructed small bowel and multiple moderate midline anterior abdominal fat containing hernias. 4. 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 5. Aortic Atherosclerosis (ICD10-I70.0). Critical Value/emergent results were called by telephone at the time of interpretation on 09/21/2022 at 10:55 am to provider Pricilla Loveless , who verbally acknowledged these results. Electronically Signed   By: Agustin Cree M.D.   On: 09/21/2022 11:00   CT Angio Chest PE W and/or Wo Contrast  Result Date: 09/21/2022 CLINICAL DATA:  Abdominal pain, dizziness, and headache. Recent diagnosis COVID with new O2 requirement EXAM: CT ANGIOGRAPHY CHEST CT ABDOMEN AND PELVIS WITH CONTRAST TECHNIQUE: Multidetector CT imaging of the chest was performed using the standard protocol during bolus administration of intravenous contrast. Multiplanar CT image reconstructions and MIPs were obtained to evaluate the vascular anatomy.  Multidetector CT imaging of the abdomen and pelvis was performed using the standard protocol during bolus administration of intravenous contrast. RADIATION DOSE REDUCTION: This exam was performed according to the departmental dose-optimization program which includes automated exposure control, adjustment of the mA and/or kV according to patient size and/or use of iterative reconstruction technique. CONTRAST:  OMNIPAQUE IOHEXOL 350 MG/ML SOLN COMPARISON:  Chest radiograph dated 09/21/2022, CT abdomen and pelvis dated 09/14/2019 FINDINGS: CTA CHEST FINDINGS Cardiovascular: The study is high quality for the evaluation of pulmonary embolism. Filling defects within the distal main pulmonary arteries bilaterally extending into all lobes. Main pulmonary artery measures 3.5 cm. Right ventricle to left ventricle ratio >  1. No significant pericardial fluid/thickening. Mediastinum/Nodes: Imaged thyroid gland without nodules meeting criteria for imaging follow-up by size. Normal esophagus. No pathologically enlarged axillary, supraclavicular, mediastinal, or hilar lymph nodes. Lungs/Pleura: The central airways are patent. No focal consolidation. No pneumothorax. No pleural effusion. Musculoskeletal: No acute or abnormal lytic or blastic osseous lesions. Multilevel degenerative changes of the thoracic spine. Review of the MIP images confirms the above findings. CT ABDOMEN and PELVIS FINDINGS Hepatobiliary: No focal hepatic lesions. No intra or extrahepatic biliary ductal dilation. Normal gallbladder. Pancreas: No focal lesions or main ductal dilation. Unchanged focus of coarse calcification in the pancreatic tail Spleen: Normal in size without focal abnormality. Adrenals/Urinary Tract: No adrenal nodules. No suspicious renal mass or hydronephrosis. Two distal left ureteral stones measuring up to 6 mm. Multiple additional bilateral nonobstructing stones. No focal bladder wall thickening. Stomach/Bowel: Normal appearance of  the stomach. Small diverticulum arising from the second portion. No evidence of bowel wall thickening, distention, or inflammatory changes. Normal appendix. Vascular/Lymphatic: Aortic atherosclerosis. No enlarged abdominal or pelvic lymph nodes. 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 Other: No free fluid, fluid collection, or free air. Musculoskeletal: No acute or abnormal lytic or blastic osseous lesions. Multilevel degenerative changes of the lumbar spine. Unchanged small right inguinal hernia contains a loop of nonobstructed small bowel. Unchanged multiple moderate midline anterior abdominal fat containing hernias. IMPRESSION: 1. Bilateral pulmonary emboli within the distal main pulmonary arteries extending into all lobes with CT findings of right heart strain. 2. Two distal left ureteral stones measuring up to 6 mm. No hydronephrosis. Multiple additional bilateral nonobstructing renal stones. 3. Unchanged small right inguinal hernia contains a loop of nonobstructed small bowel and multiple moderate midline anterior abdominal fat containing hernias. 4. 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 5. Aortic Atherosclerosis (ICD10-I70.0). Critical Value/emergent results were called by telephone at the time of interpretation on 09/21/2022 at 10:55 am to provider Pricilla Loveless , who verbally acknowledged these results. Electronically Signed   By: Agustin Cree M.D.   On: 09/21/2022 11:00   DG Chest Portable 1 View  Result Date: 09/21/2022 CLINICAL DATA:  Hypoxia. EXAM: PORTABLE CHEST 1 VIEW COMPARISON:  09/30/2019. FINDINGS: Clear lungs. Stable cardiac and mediastinal contours with tortuosity of the thoracic aorta. No pleural effusion or pneumothorax. Visualized bones and upper abdomen are unremarkable. IMPRESSION: No evidence of acute cardiopulmonary disease. Electronically Signed   By: Orvan Falconer M.D.   On: 09/21/2022 09:25    ------  Assessment:  82 y.o.  male currently admitted with bilateral PEs after COVID who had acute onset of hematuria after the Foley catheter was removed.  Concern for traumatic Foley injury with insertion which was exacerbated by the fact that patient was on a heparin drip and tPA.  At this time, recommend continuing CBI.  Keep patient on traction.  We will remove traction in the morning.  Given concern for how much bleeding the patient had, would recommend trending hemoglobin.  In addition, would hold heparin at this time while the Foley catheter is on traction to try to get hemostasis given its already started to improve on traction.     Recommendations: -Continue Foley catheter on traction.  Continue CBI at this time.  Titrate to light pink. -Hold heparin if medically able to do so in the setting of continued bleeding from his bladder. -Trend hemoglobin, transfusions per primary team. -We will remove traction from the Foley catheter in the morning.  While patient has traction on  his Foley catheter, would recommend restraints so that the patient does not remove Foley catheter.  Patient had Valium prior to having his catheter placed to calm him down.  Concerned that as he is recovering from the volume dose, he can become altered and pulled the catheter out.  To avoid further injury, recommend restraints until the Foley is off traction. -Patient will need cystoscopic evaluation complete hematuria workup. For the left ureteral stone, patient will require ureteroscopic stone extraction at a later date.  We can evaluate the bladder at that time. -Foley catheter will need to be continued for at least 1 week even when off CBI given retention of greater than 600 cc in the bladder.   Thank you for this consult. Please contact the urology consult pager with any further questions/concerns.

## 2022-09-21 NOTE — Consult Note (Signed)
Chief Complaint: Patient was seen in consultation today for pulmonary arteriogram with catheter directed thrombolysis of pulmonary emboli Chief Complaint  Patient presents with   Abdominal Pain   Dizziness   Headache    Referring Physician(s): Olalare,A  Supervising Physician: Richarda Overlie  Patient Status: [facility] - In-pt  History of Present Illness: [name] is an 82 y.o. 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  Patient presented to [facility] emergency room on 8/14 following a presyncopal episode at home with associated dyspnea, abdominal discomfort, nausea, dizziness, mid substernal chest discomfort.  CT angio chest showed bilateral pulmonary emboli with RV/LV ratio of greater than 1.  Initial troponin I 112, BNP 776.  Echocardiogram revealed ejection fraction of 50 to 55% with severely reduced right ventricular systolic function and severely dilated right atrium.  Lower extremity venous Doppler today revealed DVT involving right femoral vein.  Currently afebrile, BP 158/113, respirations 22, WBC 10.6, hemoglobin normal, platelets normal, creatinine 1.29, PT/INR normal.  CT head revealed no acute or focal lesions, no acute hemorrhage, scattered subcortical white matter hypoattenuation bilaterally  likely sequela of chronic microvascular ischemia.  Patient seen by critical care and request now received for PE thrombolysis.  Past Medical History:  Diagnosis Date   Arthritis    HANDS AND PT STATES HIS ARMS HURT   Brain tumor [facility])    age 73 - brain tumor removed left- benign - occas slight headaches since the surgery   Colonic mass 2001   Fatigue    x 1 week o as of 02-17-2020    Fibromyalgia 1990's    Frequent PVCs    Generalized body aches    x 1 week as of 02-17-2020   H/O inguinal hernia    right side   Hernia, umbilical    History of kidney stones    HX OF MULTIPLE KIDNEY STONES AND  CURRENTLY HAS BILATERAL RENAL STONES CAUSING ABDOMINAL AND BACK PAIN   Hypertension    high blood pressure readings    NSVT (nonsustained ventricular tachycardia) (HCC)    Premature atrial contractions    Prolonged Q-T interval on ECG    hx of none currently   Wears dentures    full dentures    Past Surgical History:  Procedure Laterality Date   BRAIN TUMOR EXCISION  age 67   CARDIAC CATHETERIZATION N/A 04/09/2015   Procedure: Left Heart Cath and Coronary Angiography;  Surgeon: Kathleene Hazel, MD;  Location: [facility] INVASIVE CV LAB;  Service: Cardiovascular;  Laterality: N/A;   COLON SURGERY  2001   COLON RESECTION FOR GROWTH - NOT CANCER   CYSTOSCOPY WITH RETROGRADE PYELOGRAM, URETEROSCOPY AND STENT PLACEMENT Left 10/05/2012   Procedure: CYSTOSCOPY WITH RETROGRADE PYELOGRAM, URETEROSCOPY AND STENT PLACEMENT;  Surgeon: Sebastian Ache, MD;  Location: [facility] ORS;  Service: Urology;  Laterality: Left;   CYSTOSCOPY/RETROGRADE/URETEROSCOPY/STONE EXTRACTION WITH BASKET Right 10/05/2012   Procedure: CYSTOSCOPY/RETROGRADE/URETEROSCOPY/STONE EXTRACTION WITH BASKET;  Surgeon: Sebastian Ache, MD;  Location: [facility];  Service: Urology;  Laterality: Right;   CYSTOSCOPY/RETROGRADE/URETEROSCOPY/STONE EXTRACTION WITH BASKET Bilateral 10/24/2012   Procedure: CYSTOSCOPY/RETROGRADE/URETEROSCOPY/STONE EXTRACTION WITH BASKET;  Surgeon: Sebastian Ache, MD;  Location: [facility];  Service: Urology;  Laterality: Bilateral;  with bilateral stent placement   HOLMIUM LASER APPLICATION Right 10/05/2012  Procedure: HOLMIUM LASER APPLICATION;  Surgeon: Sebastian Ache, MD;  Location: [facility];  Service: Urology;  Laterality: Right;   HOLMIUM LASER APPLICATION Bilateral 10/24/2012   Procedure: HOLMIUM LASER  APPLICATION;  Surgeon: Sebastian Ache, MD;  Location: [facility];  Service: Urology;  Laterality: Bilateral;   LITHOTRIPSY     TONSILLECTOMY  age 61's   and adenoids    Allergies: Other, Prozac [fluoxetine hcl], Sulfa antibiotics, Vicodin [hydrocodone-acetaminophen], and Erythromycin  Medications: Prior to Admission medications   Medication Sig Start Date End Date Taking? Authorizing Provider  aspirin EC 81 MG tablet Take 1 tablet (81 mg total) by mouth daily. 04/02/15  Yes Lars Masson, MD  buPROPion (WELLBUTRIN XL) 150 MG 24 hr tablet TAKE 1 TABLET BY MOUTH EVERY DAY 07/12/22  Yes Deeann Saint, MD  carvedilol (COREG) 3.125 MG tablet TAKE 1 TABLET BY MOUTH TWICE A DAY 09/15/22  Yes Nafziger, Kandee Keen, NP  ibuprofen (ADVIL) 600 MG tablet Take 600 mg by mouth every 8 (eight) hours.   Yes [provider]  lisinopril (ZESTRIL) 10 MG tablet TAKE 1 TABLET BY MOUTH EVERY DAY 08/31/22  Yes Nafziger, Kandee Keen, NP  meclizine (ANTIVERT) 25 MG tablet Take 1 tablet (25 mg total) by mouth 3 (three) times daily as needed for dizziness. 10/21/21  Yes Nafziger, Kandee Keen, NP  Misc Natural Products (OSTEO BI-FLEX ADV JOINT SHIELD PO) Take 2 capsules by mouth daily.   Yes [provider]  Multiple Vitamin (MULTIVITAMIN) tablet Take 1 tablet by mouth daily.   Yes [provider]  ondansetron (ZOFRAN) 4 MG tablet Take 1 tablet (4 mg total) by mouth every 8 (eight) hours as needed for nausea or vomiting. 10/21/21  Yes Nafziger, Kandee Keen, NP  tamsulosin (FLOMAX) 0.4 MG CAPS capsule Take 1 capsule (0.4 mg total) by mouth daily. 01/14/22  Yes Nafziger, Kandee Keen, NP  atorvastatin (LIPITOR) 10 MG tablet Take 1 tablet (10 mg total) by mouth daily. Patient not taking: Reported on 09/21/2022 10/22/21   Shirline Frees, NP  Cranberry 1000 MG CAPS Take 1,000 mg by mouth daily.  Patient not taking: Reported on 09/21/2022    [provider]  ondansetron (ZOFRAN-ODT) 4 MG disintegrating tablet Take 1 tablet (4 mg  total) by mouth every 8 (eight) hours as needed for nausea or vomiting. Patient not taking: Reported on 09/21/2022 12/14/21   Shirline Frees, NP     Family History  Problem Relation Age of Onset   Colon cancer Father    Heart disease Father        quad bypass    Social History   Socioeconomic History   Marital status: Single    Spouse name: Not on file   Number of children: Not on file   Years of education: Not on file   Highest education level: Not on file  Occupational History   Not on file  Tobacco Use   Smoking status: Never   Smokeless tobacco: Never  Vaping Use   Vaping status: Never Used  Substance and Sexual Activity   Alcohol use: Yes    Alcohol/week: 7.0 standard drinks of alcohol    Types: 7 Standard drinks or equivalent per week    Comment: 1 drink per day   Drug use: Not Currently   Sexual activity: Not on file  Other Topics Concern   Not on file  Social History Narrative   Retired from Engineer, manufacturing    Has one son - lives local    Likes to play Tennis  Social Determinants of Health   Financial Resource Strain: Low Risk  (05/06/2021)   Overall Financial Resource Strain (CARDIA)    Difficulty of Paying Living Expenses: Not hard at all  Food Insecurity: No Food Insecurity (05/06/2021)   Hunger Vital Sign    Worried About Running Out of Food in the Last Year: Never true    Ran Out of Food in the Last Year: Never true  Transportation Needs: No Transportation Needs (05/06/2021)   PRAPARE - Administrator, Civil Service (Medical): No    Lack of Transportation (Non-Medical): No  Physical Activity: Insufficiently Active (05/06/2021)   Exercise Vital Sign    Days of Exercise per Week: 1 day    Minutes of Exercise per Session: 90 min  Stress: No Stress Concern Present (05/06/2021)   [employer] of Occupational Health - Occupational Stress Questionnaire    Feeling of Stress : Not at all  Social Connections: Moderately Isolated (05/06/2021)    Social Connection and Isolation Panel [NHANES]    Frequency of Communication with Friends and Family: More than three times a week    Frequency of Social Gatherings with Friends and Family: More than three times a week    Attends Religious Services: Never    Database administrator or Organizations: Yes    Attends Engineer, structural: More than 4 times per year    Marital Status: Never married     Review of Systems see above  Vital Signs: BP (!) 158/113   Pulse 83   Temp 97.8 F (36.6 C) (Oral)   Resp (!) 22   Ht 6' (1.829 m)   Wt 168 lb (76.2 kg)   SpO2 90%   BMI 22.78 kg/m   Advance Care Plan: No documents on file   Physical Exam: Awake, alert.  Chest with slight ly diminished breath sounds bases.  Heart with normal rate, occ ectopy noted  Abdomen soft, positive bowel sounds, noted umbilical and right inguinal hernias, some suprapubic tenderness to palpation.  No significant lower extremity edema.  Imaging: VAS [place] LOWER EXTREMITY VENOUS (DVT)  Result Date: 09/21/2022  Lower Venous DVT Study Patient Name:  [name]  Date of Exam:   09/21/2022 Medical Rec #: [id number]          Accession #:    [id number] Date of Birth: 06/09/1940           Patient Gender: M Patient Age:   46 years Exam Location:  [facility] Procedure:      VAS [place] LOWER EXTREMITY VENOUS (DVT) Referring Phys: Zenia Resides --------------------------------------------------------------------------------  Indications: Pulmonary embolism.  Risk Factors: Confirmed PE. Anticoagulation: Heparin. Comparison Study: No prior studies. Performing Technologist: Chanda Busing RVT  Examination Guidelines: A complete evaluation includes B-mode imaging, spectral Doppler, color Doppler, and power Doppler as needed of all accessible portions of each vessel. Bilateral testing is considered an integral part of a complete examination. Limited examinations for reoccurring indications may be performed as noted. The  reflux portion of the exam is performed with the patient in reverse Trendelenburg.  +---------+---------------+---------+-----------+----------+--------------+ RIGHT    CompressibilityPhasicitySpontaneityPropertiesThrombus Aging +---------+---------------+---------+-----------+----------+--------------+ CFV      Full           Yes      Yes                                 +---------+---------------+---------+-----------+----------+--------------+ SFJ  Full                                                        +---------+---------------+---------+-----------+----------+--------------+ FV Prox  Partial        No       No                   Acute          +---------+---------------+---------+-----------+----------+--------------+ FV Mid   Full                                                        +---------+---------------+---------+-----------+----------+--------------+ FV DistalFull                                                        +---------+---------------+---------+-----------+----------+--------------+ PFV      Full                                                        +---------+---------------+---------+-----------+----------+--------------+ POP      Full           Yes      Yes                                 +---------+---------------+---------+-----------+----------+--------------+ PTV      Full                                                        +---------+---------------+---------+-----------+----------+--------------+ PERO     Full                                                        +---------+---------------+---------+-----------+----------+--------------+   +---------+---------------+---------+-----------+----------+--------------+ LEFT     CompressibilityPhasicitySpontaneityPropertiesThrombus Aging +---------+---------------+---------+-----------+----------+--------------+ CFV      Full           Yes      No                                   +---------+---------------+---------+-----------+----------+--------------+ SFJ      Full                                                        +---------+---------------+---------+-----------+----------+--------------+  FV Prox  Full                                                        +---------+---------------+---------+-----------+----------+--------------+ FV Mid   Full                                                        +---------+---------------+---------+-----------+----------+--------------+ FV DistalFull                                                        +---------+---------------+---------+-----------+----------+--------------+ PFV      Full                                                        +---------+---------------+---------+-----------+----------+--------------+ POP      Full           Yes      Yes                                 +---------+---------------+---------+-----------+----------+--------------+ PTV      Full                                                        +---------+---------------+---------+-----------+----------+--------------+ PERO     Full                                                        +---------+---------------+---------+-----------+----------+--------------+    Summary: RIGHT: - Findings consistent with acute deep vein thrombosis involving the right femoral vein. - No cystic structure found in the popliteal fossa.  LEFT: - There is no evidence of deep vein thrombosis in the lower extremity.  - No cystic structure found in the popliteal fossa.  *See table(s) above for measurements and observations.    Preliminary    CT HEAD W & WO CONTRAST ( )  Result Date: 09/21/2022 CLINICAL DATA:  Melanoma.  Pulmonary embolus. EXAM: CT HEAD WITHOUT AND WITH CONTRAST TECHNIQUE: Contiguous axial images were obtained from the base of the skull through the vertex without and with  intravenous contrast. RADIATION DOSE REDUCTION: This exam was performed according to the departmental dose-optimization program which includes automated exposure control, adjustment of the mA and/or kV according to patient size and/or use of iterative reconstruction technique. CONTRAST:  70mL OMNIPAQUE IOHEXOL 300 MG/ML  SOLN COMPARISON:  CT head without contrast and MR head without contrast 08/10/2019 FINDINGS: Brain: No acute infarct, hemorrhage, or mass  lesion is present. Scattered subcortical white matter hypoattenuation is present bilaterally. A Deep brain nuclei are within normal limits. The ventricles are of normal size. No significant extraaxial fluid collection is present. Vascular: Minimal calcifications are present within the cavernous internal carotid arteries. Contrast is present within the blood vessels. No asymmetric hyperdensity is present. Skull: Calvarium is intact. No focal lytic or blastic lesions are present. No significant extracranial soft tissue lesion is present. Sinuses/Orbits: The paranasal sinuses and mastoid air cells are clear. The globes and orbits are within normal limits. IMPRESSION: 1. No acute or focal lesion to suggest metastatic disease. 2. No acute hemorrhage. 3. Scattered subcortical white matter hypoattenuation bilaterally. 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 Electronically Signed   By: Marin Roberts M.D.   On: 09/21/2022 14:42   ECHOCARDIOGRAM COMPLETE  Result Date: 09/21/2022    ECHOCARDIOGRAM REPORT   Patient Name:   [name] Date of Exam: 09/21/2022 Medical Rec #:  [id number]         Height:       72.0 in Accession #:    [id number]        Weight:       168.0 lb Date of Birth:  [birth year]          BSA:          1.978 m Patient Age:    82 years          BP:           129/94 mmHg Patient Gender: M                 HR:           82 bpm. Exam Location:  Inpatient Procedure: 2D Echo, Cardiac Doppler and Color Doppler Indications:     Pulmonary Embolus I26.09  History:        Patient has prior history of Echocardiogram examinations, most                 recent 04/02/2015. Risk Factors:Hypertension.  Sonographer:    Harriette Bouillon RDCS Referring Phys: [id number] ADEWALE A OLALERE IMPRESSIONS  1. Left ventricular ejection fraction, by estimation, is 50 to 55%. The left ventricle has low normal function. The left ventricle has no regional wall motion abnormalities. There is mild left ventricular hypertrophy. Left ventricular diastolic parameters are consistent with Grade I diastolic dysfunction (impaired relaxation).  2. Right ventricular systolic function is severely reduced. The right ventricular size is moderately enlarged. There is severely elevated pulmonary artery systolic pressure.  3. Right atrial size was severely dilated.  4. The mitral valve is normal in structure. Mild mitral valve regurgitation. No evidence of mitral stenosis.  5. The aortic valve is tricuspid. Aortic valve regurgitation is not visualized. No aortic stenosis is present.  6. The inferior vena cava is dilated in size with <50% respiratory variability, suggesting right atrial pressure of 15 mmHg. Comparison(s): No prior Echocardiogram. FINDINGS  Left Ventricle: Left ventricular ejection fraction, by estimation, is 50 to 55%. The left ventricle has low normal function. The left ventricle has no regional wall motion abnormalities. The left ventricular internal cavity size was normal in size. There is mild left ventricular hypertrophy. Left ventricular diastolic parameters are consistent with Grade I diastolic dysfunction (impaired relaxation). Right Ventricle: The right ventricular size is moderately enlarged. Right ventricular systolic function is severely reduced. There is severely elevated pulmonary artery systolic pressure. The tricuspid regurgitant velocity is 3.37 m/s, and with an  assumed right atrial pressure of 15 mmHg, the estimated right ventricular systolic pressure  is 60.4 mmHg. Left Atrium: Left atrial size was normal in size. Right Atrium: Right atrial size was severely dilated. Pericardium: There is no evidence of pericardial effusion. Mitral Valve: The mitral valve is normal in structure. Mild mitral valve regurgitation. No evidence of mitral valve stenosis. Tricuspid Valve: The tricuspid valve is normal in structure. Tricuspid valve regurgitation is mild . No evidence of tricuspid stenosis. Aortic Valve: The aortic valve is tricuspid. Aortic valve regurgitation is not visualized. No aortic stenosis is present. Pulmonic Valve: The pulmonic valve was normal in structure. Pulmonic valve regurgitation is mild. No evidence of pulmonic stenosis. Aorta: The aortic root is normal in size and structure. Venous: The inferior vena cava is dilated in size with less than 50% respiratory variability, suggesting right atrial pressure of 15 mmHg. IAS/Shunts: No atrial level shunt detected by color flow Doppler.  LEFT VENTRICLE PLAX 2D LVIDd:         4.80 cm   Diastology LVIDs:         4.00 cm   LV e' medial:  3.92 cm/s LV PW:         1.20 cm   LV e' lateral: 8.59 cm/s LV IVS:        1.20 cm LVOT diam:     2.20 cm LV SV:         56 LV SV Index:   28 LVOT Area:     3.80 cm  RIGHT VENTRICLE            IVC RV S prime:     6.42 cm/s  IVC diam: 2.90 cm TAPSE (M-mode): 1.4 cm LEFT ATRIUM           Index        RIGHT ATRIUM           Index LA diam:      3.60 cm 1.82 cm/m   RA Area:     30.90 cm LA Vol (A2C): 56.5 ml 28.56 ml/m  RA Volume:   126.00 ml 63.69 ml/m LA Vol (A4C): 31.3 ml 15.82 ml/m  AORTIC VALVE             PULMONIC VALVE LVOT Vmax:   82.30 cm/s  PR End Diast Vel: 1.43 msec LVOT Vmean:  54.800 cm/s LVOT VTI:    0.147 m  AORTA Ao Root diam: 3.70 cm Ao Asc diam:  3.80 cm TRICUSPID VALVE TR Peak grad:   45.4 mmHg TR Vmax:        337.00 cm/s  SHUNTS Systemic VTI:  0.15 m Systemic Diam: 2.20 cm Olga Millers MD Electronically signed by Olga Millers MD Signature Date/Time:  09/21/2022/12:33:12 PM    Final    CT ABDOMEN PELVIS W CONTRAST  Result Date: 09/21/2022 CLINICAL DATA:  Abdominal pain, dizziness, and headache. Recent diagnosis COVID with new O2 requirement EXAM: CT ANGIOGRAPHY CHEST CT ABDOMEN AND PELVIS WITH CONTRAST TECHNIQUE: Multidetector CT imaging of the chest was performed using the standard protocol during bolus administration of intravenous contrast. Multiplanar CT image reconstructions and MIPs were obtained to evaluate the vascular anatomy. Multidetector CT imaging of the abdomen and pelvis was performed using the standard protocol during bolus administration of intravenous contrast. RADIATION DOSE REDUCTION: This exam was performed according to the departmental dose-optimization program which includes automated exposure control, adjustment of the mA and/or kV according to patient size and/or use of iterative reconstruction technique. CONTRAST:  OMNIPAQUE IOHEXOL 350 MG/ML SOLN COMPARISON:  Chest radiograph dated 09/21/2022, CT abdomen and pelvis dated 09/14/2019 FINDINGS: CTA CHEST FINDINGS Cardiovascular: The study is high quality for the evaluation of pulmonary embolism. Filling defects within the distal main pulmonary arteries bilaterally extending into all lobes. Main pulmonary artery measures 3.5 cm. Right ventricle to left ventricle ratio > 1. No significant pericardial fluid/thickening. Mediastinum/Nodes: Imaged thyroid gland without nodules meeting criteria for imaging follow-up by size. Normal esophagus. No pathologically enlarged axillary, supraclavicular, mediastinal, or hilar lymph nodes. Lungs/Pleura: The central airways are patent. No focal consolidation. No pneumothorax. No pleural effusion. Musculoskeletal: No acute or abnormal lytic or blastic osseous lesions. Multilevel degenerative changes of the thoracic spine. Review of the MIP images confirms the above findings. CT ABDOMEN and PELVIS FINDINGS Hepatobiliary: No focal hepatic lesions. No  intra or extrahepatic biliary ductal dilation. Normal gallbladder. Pancreas: No focal lesions or main ductal dilation. Unchanged focus of coarse calcification in the pancreatic tail Spleen: Normal in size without focal abnormality. Adrenals/Urinary Tract: No adrenal nodules. No suspicious renal mass or hydronephrosis. Two distal left ureteral stones measuring up to 6 mm. Multiple additional bilateral nonobstructing stones. No focal bladder wall thickening. Stomach/Bowel: Normal appearance of the stomach. Small diverticulum arising from the second portion. No evidence of bowel wall thickening, distention, or inflammatory changes. Normal appendix. Vascular/Lymphatic: Aortic atherosclerosis. No enlarged abdominal or pelvic lymph nodes. 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 Other: No free fluid, fluid collection, or free air. Musculoskeletal: No acute or abnormal lytic or blastic osseous lesions. Multilevel degenerative changes of the lumbar spine. Unchanged small right inguinal hernia contains a loop of nonobstructed small bowel. Unchanged multiple moderate midline anterior abdominal fat containing hernias. IMPRESSION: 1. Bilateral pulmonary emboli within the distal main pulmonary arteries extending into all lobes with CT findings of right heart strain. 2. Two distal left ureteral stones measuring up to 6 mm. No hydronephrosis. Multiple additional bilateral nonobstructing renal stones. 3. Unchanged small right inguinal hernia contains a loop of nonobstructed small bowel and multiple moderate midline anterior abdominal fat containing hernias. 4. 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 5. Aortic Atherosclerosis (ICD10-I70.0). Critical Value/emergent results were called by telephone at the time of interpretation on 09/21/2022 at 10:55 am to provider Pricilla Loveless , who verbally acknowledged these results. Electronically Signed   By: Agustin Cree M.D.   On: 09/21/2022 11:00    CT Angio Chest PE W and/or Wo Contrast  Result Date: 09/21/2022 CLINICAL DATA:  Abdominal pain, dizziness, and headache. Recent diagnosis COVID with new O2 requirement EXAM: CT ANGIOGRAPHY CHEST CT ABDOMEN AND PELVIS WITH CONTRAST TECHNIQUE: Multidetector CT imaging of the chest was performed using the standard protocol during bolus administration of intravenous contrast. Multiplanar CT image reconstructions and MIPs were obtained to evaluate the vascular anatomy. Multidetector CT imaging of the abdomen and pelvis was performed using the standard protocol during bolus administration of intravenous contrast. RADIATION DOSE REDUCTION: This exam was performed according to the departmental dose-optimization program which includes automated exposure control, adjustment of the mA and/or kV according to patient size and/or use of iterative reconstruction technique. CONTRAST:  OMNIPAQUE IOHEXOL 350 MG/ML SOLN COMPARISON:  Chest radiograph dated 09/21/2022, CT abdomen and pelvis dated 09/14/2019 FINDINGS: CTA CHEST FINDINGS Cardiovascular: The study is high quality for the evaluation of pulmonary embolism. Filling defects within the distal main pulmonary arteries bilaterally extending into all lobes. Main pulmonary artery measures 3.5 cm. Right ventricle to left ventricle ratio >  1. No significant pericardial fluid/thickening. Mediastinum/Nodes: Imaged thyroid gland without nodules meeting criteria for imaging follow-up by size. Normal esophagus. No pathologically enlarged axillary, supraclavicular, mediastinal, or hilar lymph nodes. Lungs/Pleura: The central airways are patent. No focal consolidation. No pneumothorax. No pleural effusion. Musculoskeletal: No acute or abnormal lytic or blastic osseous lesions. Multilevel degenerative changes of the thoracic spine. Review of the MIP images confirms the above findings. CT ABDOMEN and PELVIS FINDINGS Hepatobiliary: No focal hepatic lesions. No intra or extrahepatic  biliary ductal dilation. Normal gallbladder. Pancreas: No focal lesions or main ductal dilation. Unchanged focus of coarse calcification in the pancreatic tail Spleen: Normal in size without focal abnormality. Adrenals/Urinary Tract: No adrenal nodules. No suspicious renal mass or hydronephrosis. Two distal left ureteral stones measuring up to 6 mm. Multiple additional bilateral nonobstructing stones. No focal bladder wall thickening. Stomach/Bowel: Normal appearance of the stomach. Small diverticulum arising from the second portion. No evidence of bowel wall thickening, distention, or inflammatory changes. Normal appendix. Vascular/Lymphatic: Aortic atherosclerosis. No enlarged abdominal or pelvic lymph nodes. 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 Other: No free fluid, fluid collection, or free air. Musculoskeletal: No acute or abnormal lytic or blastic osseous lesions. Multilevel degenerative changes of the lumbar spine. Unchanged small right inguinal hernia contains a loop of nonobstructed small bowel. Unchanged multiple moderate midline anterior abdominal fat containing hernias. IMPRESSION: 1. Bilateral pulmonary emboli within the distal main pulmonary arteries extending into all lobes with CT findings of right heart strain. 2. Two distal left ureteral stones measuring up to 6 mm. No hydronephrosis. Multiple additional bilateral nonobstructing renal stones. 3. Unchanged small right inguinal hernia contains a loop of nonobstructed small bowel and multiple moderate midline anterior abdominal fat containing hernias. 4. 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 5. Aortic Atherosclerosis (ICD10-I70.0). Critical Value/emergent results were called by telephone at the time of interpretation on 09/21/2022 at 10:55 am to provider Pricilla Loveless , who verbally acknowledged these results. Electronically Signed   By: Agustin Cree M.D.   On: 09/21/2022 11:00   DG Chest Portable  1 View  Result Date: 09/21/2022 CLINICAL DATA:  Hypoxia. EXAM: PORTABLE CHEST 1 VIEW COMPARISON:  09/30/2019. FINDINGS: Clear lungs. Stable cardiac and mediastinal contours with tortuosity of the thoracic aorta. No pleural effusion or pneumothorax. Visualized bones and upper abdomen are unremarkable. IMPRESSION: No evidence of acute cardiopulmonary disease. Electronically Signed   By: Orvan Falconer M.D.   On: 09/21/2022 09:25    Labs:  CBC: Recent Labs    10/21/21 0954 09/21/22 0834  WBC 5.7 10.6*  HGB 14.9 14.3  HCT 44.4 42.3  PLT 276.0 241    COAGS: Recent Labs    09/21/22 1052  INR 1.1  APTT 27    BMP: Recent Labs    10/21/21 0954 09/21/22 0834  NA 138 134*  K 5.3* 3.8  CL 100 98  CO2 30 28  GLUCOSE 69* 125*  BUN 22 18  CALCIUM 9.9 9.4  CREATININE 1.28 1.29*  GFRNONAA  --  56*    LIVER FUNCTION TESTS: Recent Labs    10/21/21 0954 09/21/22 0834  BILITOT 0.7 1.1  AST 27 27  ALT 18 19  ALKPHOS 90 102  PROT 7.0 6.4*  ALBUMIN 4.2 3.5    TUMOR MARKERS: No results for input(s): "AFPTM", "CEA", "CA199", "CHROMGRNA" in the last 8760 hours.  Assessment and Plan: 82 y.o. 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  Patient presented to [facility] emergency room on 8/14 following a presyncopal episode at home with associated dyspnea, abdominal discomfort, nausea, dizziness, mid substernal chest discomfort.  CT angio chest showed bilateral pulmonary emboli with RV/LV ratio of greater than 1.  Initial troponin I 112, BNP 776.  Echocardiogram revealed ejection fraction of 50 to 55% with severely reduced right ventricular systolic function and severely dilated right atrium.  Lower  extremity venous Doppler today revealed DVT involving right femoral vein.  Currently afebrile, BP 158/113, respirations 22, WBC 10.6, hemoglobin normal, platelets normal, creatinine 1.29, PT/INR normal.  CT head revealed no acute or focal lesions, no acute hemorrhage, scattered subcortical white matter hypoattenuation bilaterally  likely sequela of chronic microvascular ischemia.  Patient seen by critical care and request now received for PE thrombolysis.  Currently on IV heparin.  Imaging studies/case have been reviewed by Dr.Henn.  Details/risks of PE thrombolysis, including but not limited to, internal bleeding, infection, injury to adjacent structures, inability to completely lyse clot discussed with patient and spouse with their understanding and consent.  Procedure scheduled for today ASAP.   Thank you for this interesting consult.  I greatly enjoyed meeting [name] and look forward to participating in their care.  A copy of this report was sent to the requesting provider on this date.  Electronically Signed: D. Jeananne Rama, PA-C 09/21/2022, 3:35 PM   I spent a total of  30 minutes   in face to face in clinical consultation, greater than 50% of which was counseling/coordinating care for pulmonary arteriogram with catheter directed thrombolysis of pulmonary emboli

## 2022-09-21 NOTE — Progress Notes (Addendum)
Returned from procedure.  Hemodynamically stable.  O2 needs stable.  Only real complaint was foley cath.  This was removed per his request.   Unfortunately now is having some hematuria. Pain better after foley removed. He does feel like he is emptying   Plan F/u strict I&O IV hydration  Q 4 hour bladder checks   Simonne Martinet ACNP-BC [facility] Pulmonary/Critical Care Pager # [phone] OR # [phone] if no answer

## 2022-09-21 NOTE — Progress Notes (Addendum)
ANTICOAGULATION CONSULT NOTE  Pharmacy Consult for Heparin Indication: pulmonary embolus  Allergies  Allergen Reactions   Other Other (See Comments)    Mangos - caused a severe rash   Prozac [Fluoxetine Hcl]     Made him feel "loopy"   Sulfa Antibiotics Hives   Vicodin [Hydrocodone-Acetaminophen] Itching    Tolerates oxycodone   Erythromycin Rash    Many years ago an oral antibiotic caused a rash(wife and patient think it was erythromycin but they are not positive).    Patient Measurements: Height: 6' (182.9 cm) Weight: 76.2 kg (168 lb) IBW/kg (Calculated) : 77.6 Heparin Dosing Weight: 76.2 kg  Vital Signs: Temp: 97.8 F (36.6 C) (08/14 1200) Temp Source: Oral (08/14 1200) BP: 151/95 (08/14 1900) Pulse Rate: 30 (08/14 1816)  Labs: Recent Labs    09/21/22 0834 09/21/22 1036 09/21/22 1052 09/21/22 1923  HGB 14.3  --   --  14.0  HCT 42.3  --   --  42.6  PLT 241  --   --  232  APTT  --   --  27  --   LABPROT  --   --  14.6  --   INR  --   --  1.1  --   HEPARINUNFRC  --   --   --  0.64  CREATININE 1.29*  --   --   --   TROPONINIHS 112* 101*  --   --     Estimated Creatinine Clearance: 48.4 mL/min (A) (by C-G formula based on SCr of 1.29 mg/dL (H)).   Medical History: Past Medical History:  Diagnosis Date   Arthritis    HANDS AND PT STATES HIS ARMS HURT   Brain tumor [facility])    age 43 - brain tumor removed left- benign - occas slight headaches since the surgery   Colonic mass 2001   Fatigue    x 1 week o as of 02-17-2020   Fibromyalgia 1990's    Frequent PVCs    Generalized body aches    x 1 week as of 02-17-2020   H/O inguinal hernia    right side   Hernia, umbilical    History of kidney stones    HX OF MULTIPLE KIDNEY STONES AND  CURRENTLY HAS BILATERAL RENAL STONES CAUSING ABDOMINAL AND BACK PAIN   Hypertension    high blood pressure readings    NSVT (nonsustained ventricular tachycardia) (HCC)    Premature atrial contractions    Prolonged Q-T  interval on ECG    hx of none currently   Wears dentures    full dentures    Medications:  No anticoagulation PTA  Assessment: 82 year old male presented to the ED with dizziness, shortness of breath and abdominal pain.  He has a history of hypertension, palpitations, fibromyalgia. Patient reported that last week he had cough and shortness of breath and had a positive home COVID test.     8/14 CT Angio Chest showed bilateral pulmonary emboli within the distal main pulmonary arteries extending into all lobes with CT findings of right heart strain.  Baseline Labs, 09/21/22  Hgb = 14.3  g/dL Plt = 562 K/uL aPTT 27 sec INR 1.1  09/21/2022 @1710  Started on bilateral pulmonary artery infusion of alteplase, 1 mg/hr through each catheter x 12 hours for total TPA dose = 24 mg First heparin level = 0.64 - therapeutic 8 hours after 1500 unit bolus & drip at 1300 units/hr Fibrinogen 285 - is < 500 - sent message  to Elink  Hg 14 - stable PLT 232 stable Per notes pt having some hematuria after foley removed  Goal of Therapy:  Heparin level 0.3-0.7 units/ml Monitor platelets by anticoagulation protocol: Yes   Plan:  -  continue heparin infusion at 1300 units/hr -  heparin level, CBC & fibrinogen q6h x 3 more times per catheter directed thrombolysis protocol -  Check heparin level and CBC daily -  Monitor for signs/symptoms of bleeding   Herby Abraham, Pharm.D Use secure chat for questions 09/21/2022 8:20 PM

## 2022-09-21 NOTE — ED Provider Notes (Signed)
[place] EMERGENCY DEPARTMENT AT [facility] Provider Note   CSN: [id number] Arrival date & time: 09/21/22  8657     History  Chief Complaint  Patient presents with   Abdominal Pain   Dizziness   Headache    [name] is a 82 y.o. male.  HPI 82 year old male presents with dizziness, shortness of breath and abdominal pain.  He has a history of hypertension, palpitations, fibromyalgia.  He states that last week he had cough and shortness of breath and had a positive home COVID test.  However a few days ago over the weekend he was feeling a little better and his COVID test was negative on multiple rechecks.  He tried to help his son at his house though he was getting tired earlier than normal.  He states the cough has essentially almost resolved.  However he still short of breath and this morning he was dizzy when tried to walk back from the bathroom.  He is also been dealing with abdominal pain for a long time but it was much worse these last 24 hours.  He is also very nauseated.  He denies any fevers.  Has been having dysuria for the last couple weeks.  He also passed a kidney stone a few weeks ago.  He is also dealing with diffuse body pain from his fibromyalgia which seems to be worse currently. He feels dehydrated.  EMS found him to be hypoxic in the 70s and he does not wear home oxygen.  They put him on a nonrebreather.  Currently he is on 6 L and satting in the low 90s.  Home Medications Prior to Admission medications   Medication Sig Start Date End Date Taking? Authorizing Provider  aspirin EC 81 MG tablet Take 1 tablet (81 mg total) by mouth daily. 04/02/15   Lars Masson, MD  atorvastatin (LIPITOR) 10 MG tablet Take 1 tablet (10 mg total) by mouth daily. 10/22/21   Nafziger, Kandee Keen, NP  buPROPion (WELLBUTRIN XL) 150 MG 24 hr tablet TAKE 1 TABLET BY MOUTH EVERY DAY 07/12/22   Deeann Saint, MD  carvedilol (COREG) 3.125 MG tablet TAKE 1 TABLET BY MOUTH TWICE  A DAY 09/15/22   Nafziger, Kandee Keen, NP  Cranberry 1000 MG CAPS Take 1,000 mg by mouth daily.     [provider]  lisinopril (ZESTRIL) 10 MG tablet TAKE 1 TABLET BY MOUTH EVERY DAY 08/31/22   Nafziger, Kandee Keen, NP  meclizine (ANTIVERT) 25 MG tablet Take 1 tablet (25 mg total) by mouth 3 (three) times daily as needed for dizziness. 10/21/21   Nafziger, Kandee Keen, NP  Misc Natural Products (OSTEO BI-FLEX ADV JOINT SHIELD PO) Take 2 capsules by mouth daily.    [provider]  Multiple Vitamin (MULTIVITAMIN) tablet Take 1 tablet by mouth daily.    [provider]  ondansetron (ZOFRAN) 4 MG tablet Take 1 tablet (4 mg total) by mouth every 8 (eight) hours as needed for nausea or vomiting. 10/21/21   Nafziger, Kandee Keen, NP  ondansetron (ZOFRAN-ODT) 4 MG disintegrating tablet Take 1 tablet (4 mg total) by mouth every 8 (eight) hours as needed for nausea or vomiting. 12/14/21   Nafziger, Kandee Keen, NP  tamsulosin (FLOMAX) 0.4 MG CAPS capsule Take 1 capsule (0.4 mg total) by mouth daily. 01/14/22   Shirline Frees, NP      Allergies    Other, Prozac [fluoxetine hcl], Sulfa antibiotics, Vicodin [hydrocodone-acetaminophen], and Erythromycin    Review of Systems  Review of Systems  Constitutional:  Negative for fever.  Respiratory:  Positive for cough (mild), chest tightness and shortness of breath.   Cardiovascular:  Negative for chest pain.  Gastrointestinal:  Positive for abdominal pain and nausea. Negative for vomiting.  Genitourinary:  Positive for dysuria.  Musculoskeletal:  Positive for myalgias.  Neurological:  Positive for dizziness.    Physical Exam Updated Vital Signs BP (!) 138/101   Pulse 90   Temp 97.6 F (36.4 C) (Oral)   Resp 17   Ht 6' (1.829 m)   Wt 76.2 kg   SpO2 90%   BMI 22.78 kg/m  Physical Exam Vitals and nursing note reviewed.  Constitutional:      Appearance: He is well-developed. He is not ill-appearing or diaphoretic.  HENT:     Head: Normocephalic and  atraumatic.  Cardiovascular:     Rate and Rhythm: Normal rate. Rhythm irregular.     Heart sounds: Normal heart sounds.  Pulmonary:     Effort: Pulmonary effort is normal.     Breath sounds: Normal breath sounds. No wheezing.  Abdominal:     Palpations: Abdomen is soft.     Tenderness: There is generalized abdominal tenderness.     Hernia: A hernia is present. Hernia is present in the umbilical area (reducible).  Musculoskeletal:     Right lower leg: No edema.     Left lower leg: No edema.  Skin:    General: Skin is warm and dry.  Neurological:     Mental Status: He is alert.     ED Results / Procedures / Treatments   Labs (all labs ordered are listed, but only abnormal results are displayed) Labs Reviewed  COMPREHENSIVE METABOLIC PANEL - Abnormal; Notable for the following components:      Result Value   Sodium 134 (*)    Glucose, Bld 125 (*)    Creatinine, Ser 1.29 (*)    Total Protein 6.4 (*)    GFR, Estimated 56 (*)    All other components within normal limits  CBC WITH DIFFERENTIAL/PLATELET - Abnormal; Notable for the following components:   WBC 10.6 (*)    Neutro Abs 7.9 (*)    Monocytes Absolute 1.2 (*)    All other components within normal limits  BRAIN NATRIURETIC PEPTIDE - Abnormal; Notable for the following components:   B Natriuretic Peptide 775.7 (*)    All other components within normal limits  TROPONIN I (HIGH SENSITIVITY) - Abnormal; Notable for the following components:   Troponin I (High Sensitivity) 112 (*)    All other components within normal limits  CULTURE, BLOOD (ROUTINE X 2)  CULTURE, BLOOD (ROUTINE X 2)  LIPASE, BLOOD  URINALYSIS, W/ REFLEX TO CULTURE (INFECTION SUSPECTED)  APTT  PROTIME-INR  I-STAT CG4 LACTIC ACID, ED  TROPONIN I (HIGH SENSITIVITY)    EKG EKG Interpretation Date/Time:  Wednesday September 21 2022 10:01:45 EDT Ventricular Rate:  76 PR Interval:  186 QRS Duration:  98 QT Interval:  405 QTC Calculation: 456 R  Axis:   49  Text Interpretation: Sinus rhythm Atrial premature complex Abnormal T, consider ischemia, diffuse leads T wave changes similar to earlier in the day Confirmed by Pricilla Loveless [phone]) on 09/21/2022 10:07:19 AM  Radiology DG Chest Portable 1 View  Result Date: 09/21/2022 CLINICAL DATA:  Hypoxia. EXAM: PORTABLE CHEST 1 VIEW COMPARISON:  09/30/2019. FINDINGS: Clear lungs. Stable cardiac and mediastinal contours with tortuosity of the thoracic aorta. No pleural effusion or pneumothorax. Visualized bones  and upper abdomen are unremarkable. IMPRESSION: No evidence of acute cardiopulmonary disease. Electronically Signed   By: Orvan Falconer M.D.   On: 09/21/2022 09:25    Procedures .Critical Care  Performed by: Pricilla Loveless, MD Authorized by: Pricilla Loveless, MD   Critical care provider statement:    Critical care time (minutes):  40   Critical care time was exclusive of:  Separately billable procedures and treating other patients   Critical care was necessary to treat or prevent imminent or life-threatening deterioration of the following conditions:  Circulatory failure and cardiac failure   Critical care was time spent personally by me on the following activities:  Development of treatment plan with patient or surrogate, discussions with consultants, evaluation of patient's response to treatment, examination of patient, ordering and review of laboratory studies, ordering and review of radiographic studies, ordering and performing treatments and interventions, pulse oximetry, re-evaluation of patient's condition and review of old charts     Medications Ordered in ED Medications  lactated ringers bolus 1,000 mL (0 mLs Intravenous Stopped 09/21/22 1021)  fentaNYL (SUBLIMAZE) injection 50 mcg (50 mcg Intravenous Given 09/21/22 0854)  ondansetron (ZOFRAN) injection 4 mg (4 mg Intravenous Given 09/21/22 0906)  iohexol (OMNIPAQUE) 350 MG/ML injection 100 mL (100 mLs Intravenous Contrast  Given 09/21/22 1015)  aspirin chewable tablet 324 mg (324 mg Oral Given 09/21/22 1034)    ED Course/ Medical Decision Making/ A&P                                 Medical Decision Making Amount and/or Complexity of Data Reviewed Labs: ordered.    Details: Elevated troponin and BNP, c/w right heart strain from PE Radiology: ordered and independent interpretation performed.    Details: Bilateral PE ECG/medicine tests: ordered and independent interpretation performed.    Details: Global ischemia, no STEMI Discussion of management or test interpretation with external provider(s): Discussed with radiology - bilateral PE and ureteral stones   Risk OTC drugs. Prescription drug management. Decision regarding hospitalization.   Patient presents with ataxia, dizziness, weakness and abdominal pain.  He is currently maintaining low 90s O2 sats on 6 L.  He is not distressed.  No hypotension.  Workup does show PE and he was started on heparin.  He has evidence of right heart strain as well as elevated troponins and BNP.  Discussed with intensivist, Dr. Wynona Neat. Given his findings on CT, labs, and degree of hypoxia, ICU will admit. They will also consult IR.  Has otherwise remained stable.  His urinary symptoms are probably related to a ureteral stone seen on CT.  He is afebrile.  Urine is pending but I doubt he has an acute infected stone.        Final Clinical Impression(s) / ED Diagnoses Final diagnoses:  Acute pulmonary embolism with acute cor pulmonale, unspecified pulmonary embolism type [facility])    Rx / DC Orders ED Discharge Orders     None         Pricilla Loveless, MD 09/21/22 1244

## 2022-09-21 NOTE — Progress Notes (Addendum)
Called to bedside for hematuria. Urology at bedside assessing when I arrived. Having pelvic pain not responding to opiates he has previously received. Urology planning for irrigating foley. I agree that versed before will help ease his discomfort and relax him to make insertion more comfortable. He indicated that last time he required an irrigating foley he was very uncomfortable. Wife at bedside.  IV versed Type and screen, STAT CBC now TPA stopped, stopping heparin for now until CBC is obtained and bleeding is more quantifiable and controlled.  Elink updated.   Steffanie Dunn, DO 09/21/22 10:04 PM [place] [facility]  For contact information, see Amion. If no response to pager, please call PCCM consult pager. After hours, 7PM- 7AM, please call Elink.

## 2022-09-21 NOTE — Progress Notes (Signed)
Bilateral lower extremity venous duplex has been completed. Preliminary results can be found in CV Proc through chart review.  Results were given to the patient's nurse, Cami.  09/21/22 2:55 PM Olen Cordial RVT

## 2022-09-22 ENCOUNTER — Inpatient Hospital Stay (HOSPITAL_COMMUNITY): Payer: Medicare Other

## 2022-09-22 ENCOUNTER — Encounter (HOSPITAL_COMMUNITY): Payer: Self-pay | Admitting: Pulmonary Disease

## 2022-09-22 DIAGNOSIS — I2729 Other secondary pulmonary hypertension: Secondary | ICD-10-CM | POA: Diagnosis not present

## 2022-09-22 DIAGNOSIS — I2609 Other pulmonary embolism with acute cor pulmonale: Secondary | ICD-10-CM | POA: Diagnosis not present

## 2022-09-22 HISTORY — PX: IR THROMB F/U EVAL ART/VEN FINAL DAY (MS): IMG5379

## 2022-09-22 LAB — BASIC METABOLIC PANEL
Anion gap: 8 (ref 5–15)
BUN: 18 mg/dL (ref 8–23)
CO2: 23 mmol/L (ref 22–32)
Calcium: 8 mg/dL — ABNORMAL LOW (ref 8.9–10.3)
Chloride: 102 mmol/L (ref 98–111)
Creatinine, Ser: 1.15 mg/dL (ref 0.61–1.24)
GFR, Estimated: >60 mL/min (ref >60)
Glucose, Bld: 108 mg/dL — ABNORMAL HIGH (ref 70–99)
Potassium: 4.4 mmol/L (ref 3.5–5.1)
Sodium: 133 mmol/L — ABNORMAL LOW (ref 135–145)

## 2022-09-22 LAB — CBC
HCT: 32.6 % — ABNORMAL LOW (ref 39.0–52.0)
HCT: 34.3 % — ABNORMAL LOW (ref 39.0–52.0)
HCT: 31.3 % — ABNORMAL LOW (ref 39.0–52.0)
Hemoglobin: 10.7 g/dL — ABNORMAL LOW (ref 13.0–17.0)
Hemoglobin: 11.6 g/dL — ABNORMAL LOW (ref 13.0–17.0)
Hemoglobin: 10.4 g/dL — ABNORMAL LOW (ref 13.0–17.0)
MCH: 33.2 pg (ref 26.0–34.0)
MCH: 33.3 pg (ref 26.0–34.0)
MCH: 33.4 pg (ref 26.0–34.0)
MCHC: 32.8 g/dL (ref 30.0–36.0)
MCHC: 33.8 g/dL (ref 30.0–36.0)
MCHC: 33.2 g/dL (ref 30.0–36.0)
MCV: 101.2 fL — ABNORMAL HIGH (ref 80.0–100.0)
MCV: 98.6 fL (ref 80.0–100.0)
MCV: 100.6 fL — ABNORMAL HIGH (ref 80.0–100.0)
Platelets: 157 10*3/uL (ref 150–400)
Platelets: 178 10*3/uL (ref 150–400)
Platelets: 172 10*3/uL (ref 150–400)
RBC: 3.22 MIL/uL — ABNORMAL LOW (ref 4.22–5.81)
RBC: 3.48 MIL/uL — ABNORMAL LOW (ref 4.22–5.81)
RBC: 3.11 MIL/uL — ABNORMAL LOW (ref 4.22–5.81)
RDW: 12.5 % (ref 11.5–15.5)
RDW: 12.6 % (ref 11.5–15.5)
RDW: 12.8 % (ref 11.5–15.5)
WBC: 12.3 10*3/uL — ABNORMAL HIGH (ref 4.0–10.5)
WBC: 16.3 10*3/uL — ABNORMAL HIGH (ref 4.0–10.5)
WBC: 14.8 10*3/uL — ABNORMAL HIGH (ref 4.0–10.5)
nRBC: 0 % (ref 0.0–0.2)
nRBC: 0 % (ref 0.0–0.2)
nRBC: 0 % (ref 0.0–0.2)

## 2022-09-22 LAB — HEPARIN LEVEL (UNFRACTIONATED)
Heparin Unfractionated: 0.63 [IU]/mL (ref 0.30–0.70)
Heparin Unfractionated: 0.46 [IU]/mL (ref 0.30–0.70)
Heparin Unfractionated: 0.59 [IU]/mL (ref 0.30–0.70)

## 2022-09-22 LAB — FIBRINOGEN
Fibrinogen: 273 mg/dL (ref 210–475)
Fibrinogen: 270 mg/dL (ref 210–475)

## 2022-09-22 LAB — BRAIN NATRIURETIC PEPTIDE: B Natriuretic Peptide: 807.1 pg/mL — ABNORMAL HIGH (ref 0.0–100.0)

## 2022-09-22 LAB — MAGNESIUM: Magnesium: 1.8 mg/dL (ref 1.7–2.4)

## 2022-09-22 MED ORDER — SODIUM CHLORIDE 0.9 % IR SOLN: 3000.0000 mL | Status: DC

## 2022-09-22 MED ORDER — HEPARIN (PORCINE) 25000 UT/250ML-% IV SOLN
1300.0000 [IU]/h | INTRAVENOUS | Status: DC
  Administered 2022-09-22 – 2022-09-24 (×4): 1300 [IU]/h via INTRAVENOUS
  Filled 2022-09-22 (×4): qty 250

## 2022-09-22 MED ORDER — SODIUM CHLORIDE 0.9 % IR SOLN
3000.0000 mL | Status: DC
  Administered 2022-09-22 – 2022-09-24 (×11): 3000 mL

## 2022-09-22 MED ORDER — SODIUM CHLORIDE 0.9 % IV BOLUS
500.0000 mL | Freq: Once | INTRAVENOUS | Status: AC
  Administered 2022-09-22: 500 mL via INTRAVENOUS

## 2022-09-22 MED ORDER — ORAL CARE MOUTH RINSE: 15.0000 mL | OROMUCOSAL | Status: DC | PRN

## 2022-09-22 MED ORDER — MAGNESIUM SULFATE 2 GM/50ML IV SOLN
2.0000 g | Freq: Once | INTRAVENOUS | Status: AC
  Administered 2022-09-22: 2 g via INTRAVENOUS
  Filled 2022-09-22: qty 50

## 2022-09-22 MED ORDER — FENTANYL CITRATE PF 50 MCG/ML IJ SOSY
25.0000 ug | PREFILLED_SYRINGE | INTRAMUSCULAR | Status: DC | PRN
  Administered 2022-09-22 – 2022-09-23 (×9): 25 ug via INTRAVENOUS
  Filled 2022-09-22 (×9): qty 1

## 2022-09-22 MED ORDER — LIDOCAINE HCL URETHRAL/MUCOSAL 2 % EX GEL
1.0000 | Freq: Once | CUTANEOUS | Status: AC
  Administered 2022-09-22: 1 via URETHRAL
  Filled 2022-09-22: qty 5

## 2022-09-22 MED ORDER — LACTATED RINGERS IV BOLUS
500.0000 mL | Freq: Once | INTRAVENOUS | Status: AC
  Administered 2022-09-22: 500 mL via INTRAVENOUS

## 2022-09-22 NOTE — Progress Notes (Signed)
eLink Physician-Brief Progress Note Patient Name: [name] DOB: May 22, 1940 MRN: [id number]   Date of Service  09/22/2022  HPI/Events of Note  BP 87/59, MAP 65, no evidence of active bleeding.  eICU Interventions  LR 500 ml iv bolus x 1, stat H & H.        Thomasene Lot  09/22/2022, 2:48 AM

## 2022-09-22 NOTE — Progress Notes (Signed)
Okay to leave bilateral wrist restraints off as long as patient is not pulling at foley per urology MD A. Matthew-Onabanjo.

## 2022-09-22 NOTE — Progress Notes (Signed)
ANTICOAGULATION CONSULT NOTE  Pharmacy Consult for Heparin Indication: pulmonary embolus  Allergies  Allergen Reactions   Other Other (See Comments)    Mangos - caused a severe rash   Prozac [Fluoxetine Hcl]     Made him feel "loopy"   Sulfa Antibiotics Hives   Vicodin [Hydrocodone-Acetaminophen] Itching    Tolerates oxycodone   Erythromycin Rash    Many years ago an oral antibiotic caused a rash(wife and patient think it was erythromycin but they are not positive).    Patient Measurements: Height: 6' (182.9 cm) Weight: 76.2 kg (168 lb) IBW/kg (Calculated) : 77.6 Heparin Dosing Weight: 76.2 kg  Vital Signs: Temp: 97.4 F (36.3 C) (08/15 0115) Temp Source: Oral (08/15 0115) BP: 95/64 (08/15 0100) Pulse Rate: 133 (08/15 0114)  Labs: Recent Labs    09/21/22 0834 09/21/22 1036 09/21/22 1052 09/21/22 1923 09/21/22 2239  HGB 14.3  --   --  14.0 11.9*  HCT 42.3  --   --  42.6 36.2*  PLT 241  --   --  232 195  APTT  --   --  27  --   --   LABPROT  --   --  14.6  --   --   INR  --   --  1.1  --   --   HEPARINUNFRC  --   --   --  0.64 0.33  CREATININE 1.29*  --   --   --   --   TROPONINIHS 112* 101*  --   --   --     Estimated Creatinine Clearance: 48.4 mL/min (A) (by C-G formula based on SCr of 1.29 mg/dL (H)).   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 - occas slight headaches since the surgery   Colonic mass 2001   Fatigue    x 1 week o as of 02-17-2020   Fibromyalgia 1990's    Frequent PVCs    Generalized body aches    x 1 week as of 02-17-2020   H/O inguinal hernia    right side   Hernia, umbilical    History of kidney stones    HX OF MULTIPLE KIDNEY STONES AND  CURRENTLY HAS BILATERAL RENAL STONES CAUSING ABDOMINAL AND BACK PAIN   Hypertension    high blood pressure readings    NSVT (nonsustained ventricular tachycardia)  (HCC)    Premature atrial contractions    Prolonged Q-T interval on ECG    hx of none currently   Wears dentures    full dentures    Medications:  No anticoagulation PTA  Assessment: 82 year old male presented to the ED with dizziness, shortness of breath and abdominal pain.  He has a history of hypertension, palpitations, fibromyalgia. Patient reported that last week he had cough and shortness of breath and had a positive home COVID test.     8/14 CT Angio Chest showed bilateral pulmonary emboli within the distal main pulmonary arteries extending into all lobes with CT findings of right heart strain.  Baseline Labs, 09/22/22  Hgb = 14.3  g/dL Plt = 098 K/uL aPTT 27 sec INR 1.1   09/21/22:  @1710  Started on bilateral pulmonary artery infusion of alteplase, 1 mg/hr through each catheter x 12 hours for total TPA dose = 24 mg First heparin level =  0.64 - therapeutic 8 hours after 1500 unit bolus & drip at 1300 units/hr Fibrinogen 285   Hg 14 - stable PLT 232 stable Per notes pt having some hematuria after foley removed 21:28 - tPA stopped due to hematuria 22:05 - Heparin infusion stopped due to hematuria  09/22/22 @ 01:35 - Dr Warrick Parisian notified pharmacy and requested heparin infusion be resumed with NO BOLUS  09/22/22 Heparin level = 0.33 on previous rate of heparin 1300 units/hr 8/14 22:39 Fibrinogen = 233  Goal of Therapy:  Heparin level 0.3-0.7 units/ml Monitor platelets by anticoagulation protocol: Yes   Plan:  -  restart heparin infusion at 1300 units/hr -  heparin level, CBC & fibrinogen q6h x 2 more times per catheter directed thrombolysis protocol -  Check heparin level and CBC daily -  Monitor for signs/symptoms of bleeding   Terrilee Files, PharmD 09/22/2022 1:34 AM

## 2022-09-22 NOTE — Progress Notes (Addendum)
Referring Physician(s): Dr. Zoe Lan  Supervising Physician: Richarda Overlie  Patient Status:  [facility] - In-pt  Chief Complaint: Pulmonary embolus  Subjective: To bedside alongside Dr. Lowella Dandy and IR team.  Appears slightly uncomfortable with abdominal distention.  Unfortunately developed gross hematuria overnight and tPA infusion was stopped after only 5 hrs of treatment.  Remains on heparin with tolerance and improvement in hematuria.  Remains on 13L [place] HFNC, however was on 15L overnight. Reports his breathing might be a little better.   Allergies: Other, Prozac [fluoxetine hcl], Sulfa antibiotics, Vicodin [hydrocodone-acetaminophen], and Erythromycin  Medications: Prior to Admission medications   Medication Sig Start Date End Date Taking? Authorizing Provider  aspirin EC 81 MG tablet Take 1 tablet (81 mg total) by mouth daily. 04/02/15  Yes Lars Masson, MD  buPROPion (WELLBUTRIN XL) 150 MG 24 hr tablet TAKE 1 TABLET BY MOUTH EVERY DAY 07/12/22  Yes Deeann Saint, MD  carvedilol (COREG) 3.125 MG tablet TAKE 1 TABLET BY MOUTH TWICE A DAY 09/15/22  Yes Nafziger, Kandee Keen, NP  ibuprofen (ADVIL) 600 MG tablet Take 600 mg by mouth every 8 (eight) hours.   Yes [provider]  lisinopril (ZESTRIL) 10 MG tablet TAKE 1 TABLET BY MOUTH EVERY DAY 08/31/22  Yes Nafziger, Kandee Keen, NP  meclizine (ANTIVERT) 25 MG tablet Take 1 tablet (25 mg total) by mouth 3 (three) times daily as needed for dizziness. 10/21/21  Yes Nafziger, Kandee Keen, NP  Misc Natural Products (OSTEO BI-FLEX ADV JOINT SHIELD PO) Take 2 capsules by mouth daily.   Yes [provider]  Multiple Vitamin (MULTIVITAMIN) tablet Take 1 tablet by mouth daily.   Yes [provider]  ondansetron (ZOFRAN) 4 MG tablet Take 1 tablet (4 mg total) by mouth every 8 (eight) hours as needed for nausea or vomiting. 10/21/21  Yes Nafziger, Kandee Keen, NP  tamsulosin (FLOMAX) 0.4 MG CAPS capsule Take 1 capsule (0.4 mg total) by mouth  daily. 01/14/22  Yes Nafziger, Kandee Keen, NP  atorvastatin (LIPITOR) 10 MG tablet Take 1 tablet (10 mg total) by mouth daily. Patient not taking: Reported on 09/21/2022 10/22/21   Shirline Frees, NP  Cranberry 1000 MG CAPS Take 1,000 mg by mouth daily.  Patient not taking: Reported on 09/21/2022    [provider]  ondansetron (ZOFRAN-ODT) 4 MG disintegrating tablet Take 1 tablet (4 mg total) by mouth every 8 (eight) hours as needed for nausea or vomiting. Patient not taking: Reported on 09/21/2022 12/14/21   Shirline Frees, NP     Vital Signs: BP (!) 124/57   Pulse (!) 42   Temp 99.8 F (37.7 C) (Axillary)   Resp (!) 22   Ht 6' (1.829 m)   Wt 176 lb 5.9 oz (80 kg)   SpO2 93%   BMI 23.92 kg/m   Physical Exam NAD, alert, on 13L HFNC Neck: R internal jugular catheters in place.  No oozing or bleeding.  Currently no tPA infusing.  Chest: equal rise and fall, good air movement with anterior auscultation  Abdomen: distended  Imaging: VAS [place] LOWER EXTREMITY VENOUS (DVT)  Result Date: 09/21/2022  Lower Venous DVT Study Patient Name:  MORROW FISS  Date of Exam:   09/21/2022 Medical Rec #: [id number]          Accession #:    [id number] Date of Birth: [birth year]           Patient Gender: M Patient Age:   82 years Exam Location:  [facility]  Procedure:      VAS [place] LOWER EXTREMITY VENOUS (DVT) Referring Phys: Zenia Resides --------------------------------------------------------------------------------  Indications: Pulmonary embolism.  Risk Factors: Confirmed PE. Anticoagulation: Heparin. Comparison Study: No prior studies. Performing Technologist: Chanda Busing RVT  Examination Guidelines: A complete evaluation includes B-mode imaging, spectral Doppler, color Doppler, and power Doppler as needed of all accessible portions of each vessel. Bilateral testing is considered an integral part of a complete examination. Limited examinations for reoccurring indications may be performed as  noted. The reflux portion of the exam is performed with the patient in reverse Trendelenburg.  +---------+---------------+---------+-----------+----------+--------------+ RIGHT    CompressibilityPhasicitySpontaneityPropertiesThrombus Aging +---------+---------------+---------+-----------+----------+--------------+ CFV      Full           Yes      Yes                                 +---------+---------------+---------+-----------+----------+--------------+ SFJ      Full                                                        +---------+---------------+---------+-----------+----------+--------------+ FV Prox  Partial        No       No                   Acute          +---------+---------------+---------+-----------+----------+--------------+ FV Mid   Full                                                        +---------+---------------+---------+-----------+----------+--------------+ FV DistalFull                                                        +---------+---------------+---------+-----------+----------+--------------+ PFV      Full                                                        +---------+---------------+---------+-----------+----------+--------------+ POP      Full           Yes      Yes                                 +---------+---------------+---------+-----------+----------+--------------+ PTV      Full                                                        +---------+---------------+---------+-----------+----------+--------------+ PERO     Full                                                        +---------+---------------+---------+-----------+----------+--------------+   +---------+---------------+---------+-----------+----------+--------------+  LEFT     CompressibilityPhasicitySpontaneityPropertiesThrombus Aging +---------+---------------+---------+-----------+----------+--------------+ CFV      Full            Yes      No                                  +---------+---------------+---------+-----------+----------+--------------+ SFJ      Full                                                        +---------+---------------+---------+-----------+----------+--------------+ FV Prox  Full                                                        +---------+---------------+---------+-----------+----------+--------------+ FV Mid   Full                                                        +---------+---------------+---------+-----------+----------+--------------+ FV DistalFull                                                        +---------+---------------+---------+-----------+----------+--------------+ PFV      Full                                                        +---------+---------------+---------+-----------+----------+--------------+ POP      Full           Yes      Yes                                 +---------+---------------+---------+-----------+----------+--------------+ PTV      Full                                                        +---------+---------------+---------+-----------+----------+--------------+ PERO     Full                                                        +---------+---------------+---------+-----------+----------+--------------+    Summary: RIGHT: - Findings consistent with acute deep vein thrombosis involving the right femoral vein. - No cystic structure found in the popliteal fossa.  LEFT: - There is no evidence of deep vein thrombosis in the lower extremity.  - No cystic  structure found in the popliteal fossa.  *See table(s) above for measurements and observations.    Preliminary    CT HEAD W & WO CONTRAST ( )  Result Date: 09/21/2022 CLINICAL DATA:  Melanoma.  Pulmonary embolus. EXAM: CT HEAD WITHOUT AND WITH CONTRAST TECHNIQUE: Contiguous axial images were obtained from the base of the skull through the vertex  without and with intravenous contrast. RADIATION DOSE REDUCTION: This exam was performed according to the departmental dose-optimization program which includes automated exposure control, adjustment of the mA and/or kV according to patient size and/or use of iterative reconstruction technique. CONTRAST:  70mL OMNIPAQUE IOHEXOL 300 MG/ML  SOLN COMPARISON:  CT head without contrast and MR head without contrast 08/10/2019 FINDINGS: Brain: No acute infarct, hemorrhage, or mass lesion is present. Scattered subcortical white matter hypoattenuation is present bilaterally. A Deep brain nuclei are within normal limits. The ventricles are of normal size. No significant extraaxial fluid collection is present. Vascular: Minimal calcifications are present within the cavernous internal carotid arteries. Contrast is present within the blood vessels. No asymmetric hyperdensity is present. Skull: Calvarium is intact. No focal lytic or blastic lesions are present. No significant extracranial soft tissue lesion is present. Sinuses/Orbits: The paranasal sinuses and mastoid air cells are clear. The globes and orbits are within normal limits. IMPRESSION: 1. No acute or focal lesion to suggest metastatic disease. 2. No acute hemorrhage. 3. Scattered subcortical white matter hypoattenuation bilaterally. 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 Electronically Signed   By: Marin Roberts M.D.   On: 09/21/2022 14:42   ECHOCARDIOGRAM COMPLETE  Result Date: 09/21/2022    ECHOCARDIOGRAM REPORT   Patient Name:   ANDRA MOTTON Date of Exam: 09/21/2022 Medical Rec #:  [id number]         Height:       72.0 in Accession #:    [id number]        Weight:       168.0 lb Date of Birth:  1940-09-14          BSA:          1.978 m Patient Age:    82 years          BP:           129/94 mmHg Patient Gender: M                 HR:           82 bpm. Exam Location:  Inpatient Procedure: 2D Echo, Cardiac Doppler and Color  Doppler Indications:    Pulmonary Embolus I26.09  History:        Patient has prior history of Echocardiogram examinations, most                 recent 04/02/2015. Risk Factors:Hypertension.  Sonographer:    Harriette Bouillon RDCS Referring Phys: [id number] ADEWALE A OLALERE IMPRESSIONS  1. Left ventricular ejection fraction, by estimation, is 50 to 55%. The left ventricle has low normal function. The left ventricle has no regional wall motion abnormalities. There is mild left ventricular hypertrophy. Left ventricular diastolic parameters are consistent with Grade I diastolic dysfunction (impaired relaxation).  2. Right ventricular systolic function is severely reduced. The right ventricular size is moderately enlarged. There is severely elevated pulmonary artery systolic pressure.  3. Right atrial size was severely dilated.  4. The mitral valve is normal in structure. Mild mitral valve regurgitation. No evidence of mitral stenosis.  5. The aortic valve is  tricuspid. Aortic valve regurgitation is not visualized. No aortic stenosis is present.  6. The inferior vena cava is dilated in size with <50% respiratory variability, suggesting right atrial pressure of 15 mmHg. Comparison(s): No prior Echocardiogram. FINDINGS  Left Ventricle: Left ventricular ejection fraction, by estimation, is 50 to 55%. The left ventricle has low normal function. The left ventricle has no regional wall motion abnormalities. The left ventricular internal cavity size was normal in size. There is mild left ventricular hypertrophy. Left ventricular diastolic parameters are consistent with Grade I diastolic dysfunction (impaired relaxation). Right Ventricle: The right ventricular size is moderately enlarged. Right ventricular systolic function is severely reduced. There is severely elevated pulmonary artery systolic pressure. The tricuspid regurgitant velocity is 3.37 m/s, and with an assumed right atrial pressure of 15 mmHg, the estimated right  ventricular systolic pressure is 60.4 mmHg. Left Atrium: Left atrial size was normal in size. Right Atrium: Right atrial size was severely dilated. Pericardium: There is no evidence of pericardial effusion. Mitral Valve: The mitral valve is normal in structure. Mild mitral valve regurgitation. No evidence of mitral valve stenosis. Tricuspid Valve: The tricuspid valve is normal in structure. Tricuspid valve regurgitation is mild . No evidence of tricuspid stenosis. Aortic Valve: The aortic valve is tricuspid. Aortic valve regurgitation is not visualized. No aortic stenosis is present. Pulmonic Valve: The pulmonic valve was normal in structure. Pulmonic valve regurgitation is mild. No evidence of pulmonic stenosis. Aorta: The aortic root is normal in size and structure. Venous: The inferior vena cava is dilated in size with less than 50% respiratory variability, suggesting right atrial pressure of 15 mmHg. IAS/Shunts: No atrial level shunt detected by color flow Doppler.  LEFT VENTRICLE PLAX 2D LVIDd:         4.80 cm   Diastology LVIDs:         4.00 cm   LV e' medial:  3.92 cm/s LV PW:         1.20 cm   LV e' lateral: 8.59 cm/s LV IVS:        1.20 cm LVOT diam:     2.20 cm LV SV:         56 LV SV Index:   28 LVOT Area:     3.80 cm  RIGHT VENTRICLE            IVC RV S prime:     6.42 cm/s  IVC diam: 2.90 cm TAPSE (M-mode): 1.4 cm LEFT ATRIUM           Index        RIGHT ATRIUM           Index LA diam:      3.60 cm 1.82 cm/m   RA Area:     30.90 cm LA Vol (A2C): 56.5 ml 28.56 ml/m  RA Volume:   126.00 ml 63.69 ml/m LA Vol (A4C): 31.3 ml 15.82 ml/m  AORTIC VALVE             PULMONIC VALVE LVOT Vmax:   82.30 cm/s  PR End Diast Vel: 1.43 msec LVOT Vmean:  54.800 cm/s LVOT VTI:    0.147 m  AORTA Ao Root diam: 3.70 cm Ao Asc diam:  3.80 cm TRICUSPID VALVE TR Peak grad:   45.4 mmHg TR Vmax:        337.00 cm/s  SHUNTS Systemic VTI:  0.15 m Systemic Diam: 2.20 cm Olga Millers MD Electronically signed by Olga Millers  MD Signature Date/Time: 09/21/2022/12:33:12  PM    Final    CT ABDOMEN PELVIS W CONTRAST  Result Date: 09/21/2022 CLINICAL DATA:  Abdominal pain, dizziness, and headache. Recent diagnosis COVID with new O2 requirement EXAM: CT ANGIOGRAPHY CHEST CT ABDOMEN AND PELVIS WITH CONTRAST TECHNIQUE: Multidetector CT imaging of the chest was performed using the standard protocol during bolus administration of intravenous contrast. Multiplanar CT image reconstructions and MIPs were obtained to evaluate the vascular anatomy. Multidetector CT imaging of the abdomen and pelvis was performed using the standard protocol during bolus administration of intravenous contrast. RADIATION DOSE REDUCTION: This exam was performed according to the departmental dose-optimization program which includes automated exposure control, adjustment of the mA and/or kV according to patient size and/or use of iterative reconstruction technique. CONTRAST:  OMNIPAQUE IOHEXOL 350 MG/ML SOLN COMPARISON:  Chest radiograph dated 09/21/2022, CT abdomen and pelvis dated 09/14/2019 FINDINGS: CTA CHEST FINDINGS Cardiovascular: The study is high quality for the evaluation of pulmonary embolism. Filling defects within the distal main pulmonary arteries bilaterally extending into all lobes. Main pulmonary artery measures 3.5 cm. Right ventricle to left ventricle ratio > 1. No significant pericardial fluid/thickening. Mediastinum/Nodes: Imaged thyroid gland without nodules meeting criteria for imaging follow-up by size. Normal esophagus. No pathologically enlarged axillary, supraclavicular, mediastinal, or hilar lymph nodes. Lungs/Pleura: The central airways are patent. No focal consolidation. No pneumothorax. No pleural effusion. Musculoskeletal: No acute or abnormal lytic or blastic osseous lesions. Multilevel degenerative changes of the thoracic spine. Review of the MIP images confirms the above findings. CT ABDOMEN and PELVIS FINDINGS Hepatobiliary: No  focal hepatic lesions. No intra or extrahepatic biliary ductal dilation. Normal gallbladder. Pancreas: No focal lesions or main ductal dilation. Unchanged focus of coarse calcification in the pancreatic tail Spleen: Normal in size without focal abnormality. Adrenals/Urinary Tract: No adrenal nodules. No suspicious renal mass or hydronephrosis. Two distal left ureteral stones measuring up to 6 mm. Multiple additional bilateral nonobstructing stones. No focal bladder wall thickening. Stomach/Bowel: Normal appearance of the stomach. Small diverticulum arising from the second portion. No evidence of bowel wall thickening, distention, or inflammatory changes. Normal appendix. Vascular/Lymphatic: Aortic atherosclerosis. No enlarged abdominal or pelvic lymph nodes. 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 Other: No free fluid, fluid collection, or free air. Musculoskeletal: No acute or abnormal lytic or blastic osseous lesions. Multilevel degenerative changes of the lumbar spine. Unchanged small right inguinal hernia contains a loop of nonobstructed small bowel. Unchanged multiple moderate midline anterior abdominal fat containing hernias. IMPRESSION: 1. Bilateral pulmonary emboli within the distal main pulmonary arteries extending into all lobes with CT findings of right heart strain. 2. Two distal left ureteral stones measuring up to 6 mm. No hydronephrosis. Multiple additional bilateral nonobstructing renal stones. 3. Unchanged small right inguinal hernia contains a loop of nonobstructed small bowel and multiple moderate midline anterior abdominal fat containing hernias. 4. 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 5. Aortic Atherosclerosis (ICD10-I70.0). Critical Value/emergent results were called by telephone at the time of interpretation on 09/21/2022 at 10:55 am to provider Pricilla Loveless , who verbally acknowledged these results. Electronically Signed   By: Agustin Cree  M.D.   On: 09/21/2022 11:00   CT Angio Chest PE W and/or Wo Contrast  Result Date: 09/21/2022 CLINICAL DATA:  Abdominal pain, dizziness, and headache. Recent diagnosis COVID with new O2 requirement EXAM: CT ANGIOGRAPHY CHEST CT ABDOMEN AND PELVIS WITH CONTRAST TECHNIQUE: Multidetector CT imaging of the chest was performed using the standard protocol during bolus administration of  intravenous contrast. Multiplanar CT image reconstructions and MIPs were obtained to evaluate the vascular anatomy. Multidetector CT imaging of the abdomen and pelvis was performed using the standard protocol during bolus administration of intravenous contrast. RADIATION DOSE REDUCTION: This exam was performed according to the departmental dose-optimization program which includes automated exposure control, adjustment of the mA and/or kV according to patient size and/or use of iterative reconstruction technique. CONTRAST:  OMNIPAQUE IOHEXOL 350 MG/ML SOLN COMPARISON:  Chest radiograph dated 09/21/2022, CT abdomen and pelvis dated 09/14/2019 FINDINGS: CTA CHEST FINDINGS Cardiovascular: The study is high quality for the evaluation of pulmonary embolism. Filling defects within the distal main pulmonary arteries bilaterally extending into all lobes. Main pulmonary artery measures 3.5 cm. Right ventricle to left ventricle ratio > 1. No significant pericardial fluid/thickening. Mediastinum/Nodes: Imaged thyroid gland without nodules meeting criteria for imaging follow-up by size. Normal esophagus. No pathologically enlarged axillary, supraclavicular, mediastinal, or hilar lymph nodes. Lungs/Pleura: The central airways are patent. No focal consolidation. No pneumothorax. No pleural effusion. Musculoskeletal: No acute or abnormal lytic or blastic osseous lesions. Multilevel degenerative changes of the thoracic spine. Review of the MIP images confirms the above findings. CT ABDOMEN and PELVIS FINDINGS Hepatobiliary: No focal hepatic  lesions. No intra or extrahepatic biliary ductal dilation. Normal gallbladder. Pancreas: No focal lesions or main ductal dilation. Unchanged focus of coarse calcification in the pancreatic tail Spleen: Normal in size without focal abnormality. Adrenals/Urinary Tract: No adrenal nodules. No suspicious renal mass or hydronephrosis. Two distal left ureteral stones measuring up to 6 mm. Multiple additional bilateral nonobstructing stones. No focal bladder wall thickening. Stomach/Bowel: Normal appearance of the stomach. Small diverticulum arising from the second portion. No evidence of bowel wall thickening, distention, or inflammatory changes. Normal appendix. Vascular/Lymphatic: Aortic atherosclerosis. No enlarged abdominal or pelvic lymph nodes. 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 Other: No free fluid, fluid collection, or free air. Musculoskeletal: No acute or abnormal lytic or blastic osseous lesions. Multilevel degenerative changes of the lumbar spine. Unchanged small right inguinal hernia contains a loop of nonobstructed small bowel. Unchanged multiple moderate midline anterior abdominal fat containing hernias. IMPRESSION: 1. Bilateral pulmonary emboli within the distal main pulmonary arteries extending into all lobes with CT findings of right heart strain. 2. Two distal left ureteral stones measuring up to 6 mm. No hydronephrosis. Multiple additional bilateral nonobstructing renal stones. 3. Unchanged small right inguinal hernia contains a loop of nonobstructed small bowel and multiple moderate midline anterior abdominal fat containing hernias. 4. 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 5. Aortic Atherosclerosis (ICD10-I70.0). Critical Value/emergent results were called by telephone at the time of interpretation on 09/21/2022 at 10:55 am to provider Pricilla Loveless , who verbally acknowledged these results. Electronically Signed   By: Agustin Cree M.D.   On:  09/21/2022 11:00   DG Chest Portable 1 View  Result Date: 09/21/2022 CLINICAL DATA:  Hypoxia. EXAM: PORTABLE CHEST 1 VIEW COMPARISON:  09/30/2019. FINDINGS: Clear lungs. Stable cardiac and mediastinal contours with tortuosity of the thoracic aorta. No pleural effusion or pneumothorax. Visualized bones and upper abdomen are unremarkable. IMPRESSION: No evidence of acute cardiopulmonary disease. Electronically Signed   By: Orvan Falconer M.D.   On: 09/21/2022 09:25    Labs:  CBC: Recent Labs    09/21/22 1923 09/21/22 2239 09/22/22 0300 09/22/22 0758  WBC 14.3* 13.9* 12.3* 16.3*  HGB 14.0 11.9* 10.7* 11.6*  HCT 42.6 36.2* 32.6* 34.3*  PLT 232 195 157 178  COAGS: Recent Labs    09/21/22 1052  INR 1.1  APTT 27    BMP: Recent Labs    10/21/21 0954 09/21/22 0834 09/22/22 0300  NA 138 134* 133*  K 5.3* 3.8 4.4  CL 100 98 102  CO2 30 28 23   GLUCOSE 69* 125* 108*  BUN 22 18 18   CALCIUM 9.9 9.4 8.0*  CREATININE 1.28 1.29* 1.15  GFRNONAA  --  56* >60    LIVER FUNCTION TESTS: Recent Labs    10/21/21 0954 09/21/22 0834  BILITOT 0.7 1.1  AST 27 27  ALT 18 19  ALKPHOS 90 102  PROT 7.0 6.4*  ALBUMIN 4.2 3.5    Assessment and Plan: Pulmonary embolus  s/p initiation and early termination of PE lysis procedure due to development of gross hematuria with drop in Hgb after only 5 hrs of lytic infusion.  Patient subjectively reports his breathing is "a little better", however he remains on 13L HFNC.   Hgb stable at 10.7.  No transfusion planned at this time.  Urine has cleared significantly.  Tolerating heparin infusion.   Dr. Lowella Dandy and IR team at bedside to remove lysis catheters.  Pressure check shows modest improvement in PA pressure: Pre-procedure: 58/13 (31) Post-procedure: 52/15 (27)  Catheters removed at bedside, site intact.  Dressing placed.   Continue care per CCM/primary team.    Electronically Signed: Hoyt Koch, PA 09/22/2022, 9:42  AM   I spent a total of 25 Minutes at the the patient's bedside AND on the patient's hospital floor or unit, greater than 50% of which was counseling/coordinating care for pulmonary embolus

## 2022-09-22 NOTE — Progress Notes (Signed)
ANTICOAGULATION CONSULT NOTE Pharmacy Consult for Heparin Indication: pulmonary embolus  Patient Measurements: Height: 6' (182.9 cm) Weight: 80 kg (176 lb 5.9 oz) IBW/kg (Calculated) : 77.6 Heparin Dosing Weight: 76 kg  Medications:  Infusions:   sodium chloride     sodium chloride Stopped (09/22/22 0853)   sodium chloride Stopped (09/22/22 0853)   heparin 1,300 Units/hr (09/22/22 2151)   lactated ringers 125 mL/hr at 09/22/22 2151   sodium chloride irrigation      Brief Assessment (see full note earlier today from Tacy Learn PharmD.) 82 year old male presented to the ED with dizziness, shortness of breath and abdominal pain.  CT Angio + bilateral PE with RHS.  Pharmacy is consulted to dose Heparin   Significant Events 09/21/22 1710: Start bilateral pulmonary artery infusion of alteplase, 1 mg/hr through each catheter x 12 hours for total TPA dose = 24 mg 8/14 1923: First heparin level = 0.64 - therapeutic 8 hours after 1500 unit bolus & drip at 1300 units/hr.  Per notes pt having some hematuria after foley removed 8/14 at 21:28 - tPA stopped due to hematuria 8/14 at 22:05 - Heparin infusion stopped due to hematuria 09/22/22 01:35 - Dr Warrick Parisian notified pharmacy and requested heparin infusion be resumed with NO BOLUS  Today, 09/22/2022:  Evening update:  2121 Heparin level 0.59- remains therapeutic  Heparin level 0.46, remains therapeutic on heparin 1300 units/hr CBC:  Hgb down to 10.4, Plt WNL Fibrinogen: 270 Per RN, no bleeding issues.  CT negative for retroperitoneal bleed.   Goal of Therapy:  Heparin level 0.3-0.7 units/ml Monitor platelets by anticoagulation protocol: Yes   Plan:  Continue heparin IV infusion at 1300 units/hr Heparin level in 6 hours. Daily heparin level and CBC   Junita Push, PharmD, BCPS 09/22/2022 10:28 PM

## 2022-09-22 NOTE — Progress Notes (Signed)
[facility] ADULT ICU REPLACEMENT PROTOCOL   The patient does apply for the [facility] Adult ICU Electrolyte Replacment Protocol based on the criteria listed below:   1.Exclusion criteria: TCTS, ECMO, Dialysis, and Myasthenia Gravis patients 2. Is GFR >/= 30 ml/min? Yes.    Patient's GFR today is >60 3. Is SCr </= 2? Yes.   Patient's SCr is 1.15 mg/dL 4. Did SCr increase >/= 0.5 in 24 hours? No. 5.Pt's weight >40kg  Yes.   6. Abnormal electrolyte(s): mag 1.8 7. Electrolytes replaced per protocol 8.  Call MD STAT for K+ </= 2.5, Phos </= 1, or Mag </= 1 Physician:  protocol  Melvern Banker 09/22/2022 4:03 AM

## 2022-09-22 NOTE — Progress Notes (Signed)
I reviewed the medical record in full detail and cosign the consult note and progress note from September 22, 2022  Patient had gross hematuria and likely trauma from catheterization in combination with heparin urine now is pink.  I agree that abdominal distention this morning likely not from bladder.  He is going to have another CT scan.  He has an asymptomatic two distal left ureteral stones re 6 mm each and no hydronephrosis in combination of pulmonary emboli  Clinically not distended now and very comfortable  We will continue to follow.  I spoke to the patient.

## 2022-09-22 NOTE — Progress Notes (Addendum)
ANTICOAGULATION CONSULT NOTE  Pharmacy Consult for Heparin Indication: pulmonary embolus  Allergies  Allergen Reactions   Other Other (See Comments)    Mangos - caused a severe rash   Prozac [Fluoxetine Hcl]     Made him feel "loopy"   Sulfa Antibiotics Hives   Vicodin [Hydrocodone-Acetaminophen] Itching    Tolerates oxycodone   Erythromycin Rash    Many years ago an oral antibiotic caused a rash(wife and patient think it was erythromycin but they are not positive).    Patient Measurements: Height: 6' (182.9 cm) Weight: 80 kg (176 lb 5.9 oz) IBW/kg (Calculated) : 77.6 Heparin Dosing Weight: 76.2 kg  Vital Signs: Temp: 99.8 F (37.7 C) (08/15 0743) Temp Source: Axillary (08/15 0743) BP: 112/82 (08/15 0800) Pulse Rate: 76 (08/15 0800)  Labs: Recent Labs    09/21/22 0834 09/21/22 1036 09/21/22 1052 09/21/22 1923 09/21/22 2239 09/22/22 0300 09/22/22 0758  HGB 14.3  --   --  14.0 11.9* 10.7* 11.6*  HCT 42.3  --   --  42.6 36.2* 32.6* 34.3*  PLT 241  --   --  232 195 157 178  APTT  --   --  27  --   --   --   --   LABPROT  --   --  14.6  --   --   --   --   INR  --   --  1.1  --   --   --   --   HEPARINUNFRC  --   --   --  0.64 0.33  --   --   CREATININE 1.29*  --   --   --   --  1.15  --   TROPONINIHS 112* 101*  --   --   --   --   --     Estimated Creatinine Clearance: 55.3 mL/min (by C-G formula based on SCr of 1.15 mg/dL).   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 - occas slight headaches since the surgery   Colonic mass 2001   Fatigue    x 1 week o as of 02-17-2020   Fibromyalgia 1990's    Frequent PVCs    Generalized body aches    x 1 week as of 02-17-2020   H/O inguinal hernia    right side   Hernia, umbilical    History of kidney stones    HX OF MULTIPLE KIDNEY STONES AND  CURRENTLY HAS BILATERAL RENAL STONES CAUSING  ABDOMINAL AND BACK PAIN   Hypertension    high blood pressure readings    NSVT (nonsustained ventricular tachycardia) (HCC)    Premature atrial contractions    Prolonged Q-T interval on ECG    hx of none currently   Wears dentures    full dentures    Medications:  No anticoagulation PTA  Assessment: 82 year old male presented to the ED with dizziness, shortness of breath and abdominal pain.  He has a history of hypertension, palpitations, fibromyalgia. Patient reported that last week he had cough and shortness of breath and had a positive home COVID test.     8/14 CT Angio Chest showed bilateral pulmonary emboli within the distal main pulmonary arteries extending into all lobes with CT findings of right heart strain.  Baseline Labs, 09/22/22  Hgb =  14.3  g/dL Plt = 010 K/uL aPTT 27 sec INR 1.1  Significant Events 09/21/22:  @1710  Started on bilateral pulmonary artery infusion of alteplase, 1 mg/hr through each catheter x 12 hours for total TPA dose = 24 mg 8/14 at 1923: First heparin level = 0.64 - therapeutic 8 hours after 1500 unit bolus & drip at 1300 units/hr Per notes pt having some hematuria after foley removed 8/14 at 21:28 - tPA stopped due to hematuria 8/14 at 22:05 - Heparin infusion stopped due to hematuria   09/22/22 @ 01:35 - Dr Warrick Parisian notified pharmacy and requested heparin infusion be resumed with NO BOLUS 09/22/22 at 0800 per RN bleeding resolved; urine clear.  Bleeding from surgical site on face resolved.  Labs: Date/Time HL Fibrinogen Hgb  Hct Plt   8/14 @ 1923 0.64 285 14 46.2 232  8/14 @ 2239 0.33 233 11.9 36.2 195  8/15 @ 0300   --   -- 10.7 32.6 157  8/15 @ 0758   --   -- 11.6 34.3 178  8/15 @ 0850 0.63 273   --   --   --    Today, 09/22/22 At 0850 Heparin level= 0.63, therapeutic on IV heparin at 1300 units/hr At 0850 Fibrinogen= 273 CBC At 0758 Hgb=11.6, low/ stabilized; Hct= 34.3 low/ stabilized; Plt=178 wnl    Goal of Therapy:  Heparin level  0.3-0.7 units/ml Monitor platelets by anticoagulation protocol: Yes   Plan:  -  Continue Heparin infusion at 1300 units/hr  - CBC & Fibrinogen q6h x 1 more time at 1500 per catheter directed thrombolysis protocol.  - Heparin Level q6h x 3 (for 24 hours after alteplase infusion ended) per catheter directed thrombolysis protocol - CBC daily  - Check heparin level and CBC daily - Monitor for signs/symptoms of bleeding   Tacy Learn, PharmD Clinical Pharmacist 09/22/2022 8:59 AM

## 2022-09-22 NOTE — Consult Note (Signed)
Subjective: Foley on traction overnight. Heparin restarted. Urine clear this morning on slow drip with traction. Traction and CBI stopped.   Pt with pain in the abdomen as well as tip of the penis.   Objective: Vital signs in last 24 hours: Temp:  [97.4 F (36.3 C)-99.8 F (37.7 C)] 99.8 F (37.7 C) (08/15 0743) Pulse Rate:  [25-133] 76 (08/15 0800) Resp:  [11-36] 27 (08/15 0800) BP: (86-185)/(54-121) 112/82 (08/15 0800) SpO2:  [85 %-100 %] 97 % (08/15 0800) Weight:  [76.2 kg-80 kg] 80 kg (08/15 0500)  Assessment/Plan: 82 year old man currently admitted for bilateral PEs in the setting of having COVID 10 days ago, anticoagulated who likely had traumatic Foley placement hematuria.  Hematuria improved with traction.  # Hematuria and retention -CBI stopped this morning.  Foley taken off traction.  Will continue to monitor.  Hemoglobin 11 today. -If urine becomes cherry red or thick, page urology -Patient will likely need to keep Foley for a week given he had 600 cc of urine in his bladder upon placement of the three-way catheter.  #Incidental finding of left distal kidney stone -Patient will need follow-up for left ureteroscopy.  At that time we can evaluate the bladder.  Given he does not have any flank pain and his UA is negative, no need for stent procedure. Intake/Output from previous day: 08/14 0701 - 08/15 0700 In: 13720.4 [I.V.:2212.6; IV Piggyback:550.2] Out: 04540 [Urine:13450]  Intake/Output this shift: Total I/O In: 3100 [Other:3100] Out: 1600 [Urine:1600]  Physical Exam:  General: Alert and oriented CV: No cyanosis Lungs: equal chest rise Abdomen: Soft, NTND, no rebound or guarding Skin: Gu: 3 way catheter in place. Urine clear on slow drip CBI with foley on traction.   Lab Results: Recent Labs    09/21/22 2239 09/22/22 0300 09/22/22 0758  HGB 11.9* 10.7* 11.6*  HCT 36.2* 32.6* 34.3*   BMET Recent Labs    09/21/22 0834 09/22/22 0300  NA 134*  133*  K 3.8 4.4  CL 98 102  CO2 28 23  GLUCOSE 125* 108*  BUN 18 18  CREATININE 1.29* 1.15  CALCIUM 9.4 8.0*     Studies/Results: VAS [place] LOWER EXTREMITY VENOUS (DVT)  Result Date: 09/21/2022  Lower Venous DVT Study Patient Name:  [name]  Date of Exam:   09/21/2022 Medical Rec #: [id number]          Accession #:    [id number] Date of Birth: 1940-06-04           Patient Gender: M Patient Age:   14 years Exam Location:  [facility] [place] Procedure:      VAS [place] LOWER EXTREMITY VENOUS (DVT) Referring Phys: Zenia Resides --------------------------------------------------------------------------------  Indications: Pulmonary embolism.  Risk Factors: Confirmed PE. Anticoagulation: Heparin. Comparison Study: No prior studies. Performing Technologist: Chanda Busing RVT  Examination Guidelines: A complete evaluation includes B-mode imaging, spectral Doppler, color Doppler, and power Doppler as needed of all accessible portions of each vessel. Bilateral testing is considered an integral part of a complete examination. Limited examinations for reoccurring indications may be performed as noted. The reflux portion of the exam is performed with the patient in reverse Trendelenburg.  +---------+---------------+---------+-----------+----------+--------------+ RIGHT    CompressibilityPhasicitySpontaneityPropertiesThrombus Aging +---------+---------------+---------+-----------+----------+--------------+ CFV      Full           Yes      Yes                                 +---------+---------------+---------+-----------+----------+--------------+  SFJ      Full                                                        +---------+---------------+---------+-----------+----------+--------------+ FV Prox  Partial        No       No                   Acute          +---------+---------------+---------+-----------+----------+--------------+ FV Mid   Full                                                         +---------+---------------+---------+-----------+----------+--------------+ FV DistalFull                                                        +---------+---------------+---------+-----------+----------+--------------+ PFV      Full                                                        +---------+---------------+---------+-----------+----------+--------------+ POP      Full           Yes      Yes                                 +---------+---------------+---------+-----------+----------+--------------+ PTV      Full                                                        +---------+---------------+---------+-----------+----------+--------------+ PERO     Full                                                        +---------+---------------+---------+-----------+----------+--------------+   +---------+---------------+---------+-----------+----------+--------------+ LEFT     CompressibilityPhasicitySpontaneityPropertiesThrombus Aging +---------+---------------+---------+-----------+----------+--------------+ CFV      Full           Yes      No                                  +---------+---------------+---------+-----------+----------+--------------+ SFJ      Full                                                        +---------+---------------+---------+-----------+----------+--------------+  FV Prox  Full                                                        +---------+---------------+---------+-----------+----------+--------------+ FV Mid   Full                                                        +---------+---------------+---------+-----------+----------+--------------+ FV DistalFull                                                        +---------+---------------+---------+-----------+----------+--------------+ PFV      Full                                                         +---------+---------------+---------+-----------+----------+--------------+ POP      Full           Yes      Yes                                 +---------+---------------+---------+-----------+----------+--------------+ PTV      Full                                                        +---------+---------------+---------+-----------+----------+--------------+ PERO     Full                                                        +---------+---------------+---------+-----------+----------+--------------+    Summary: RIGHT: - Findings consistent with acute deep vein thrombosis involving the right femoral vein. - No cystic structure found in the popliteal fossa.  LEFT: - There is no evidence of deep vein thrombosis in the lower extremity.  - No cystic structure found in the popliteal fossa.  *See table(s) above for measurements and observations.    Preliminary    CT HEAD W & WO CONTRAST ( )  Result Date: 09/21/2022 CLINICAL DATA:  Melanoma.  Pulmonary embolus. EXAM: CT HEAD WITHOUT AND WITH CONTRAST TECHNIQUE: Contiguous axial images were obtained from the base of the skull through the vertex without and with intravenous contrast. RADIATION DOSE REDUCTION: This exam was performed according to the departmental dose-optimization program which includes automated exposure control, adjustment of the mA and/or kV according to patient size and/or use of iterative reconstruction technique. CONTRAST:  70mL OMNIPAQUE IOHEXOL 300 MG/ML  SOLN COMPARISON:  CT head without contrast and MR head without contrast 08/10/2019 FINDINGS: Brain: No acute infarct, hemorrhage, or mass  lesion is present. Scattered subcortical white matter hypoattenuation is present bilaterally. A Deep brain nuclei are within normal limits. The ventricles are of normal size. No significant extraaxial fluid collection is present. Vascular: Minimal calcifications are present within the cavernous internal carotid arteries. Contrast is  present within the blood vessels. No asymmetric hyperdensity is present. Skull: Calvarium is intact. No focal lytic or blastic lesions are present. No significant extracranial soft tissue lesion is present. Sinuses/Orbits: The paranasal sinuses and mastoid air cells are clear. The globes and orbits are within normal limits. IMPRESSION: 1. No acute or focal lesion to suggest metastatic disease. 2. No acute hemorrhage. 3. Scattered subcortical white matter hypoattenuation bilaterally. 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 Electronically Signed   By: Marin Roberts M.D.   On: 09/21/2022 14:42   ECHOCARDIOGRAM COMPLETE  Result Date: 09/21/2022    ECHOCARDIOGRAM REPORT   Patient Name:   [name] Date of Exam: 09/21/2022 Medical Rec #:  [id number]         Height:       72.0 in Accession #:    [id number]        Weight:       168.0 lb Date of Birth:  1940/03/09          BSA:          1.978 m Patient Age:    82 years          BP:           129/94 mmHg Patient Gender: M                 HR:           82 bpm. Exam Location:  Inpatient Procedure: 2D Echo, Cardiac Doppler and Color Doppler Indications:    Pulmonary Embolus I26.09  History:        Patient has prior history of Echocardiogram examinations, most                 recent 04/02/2015. Risk Factors:Hypertension.  Sonographer:    Harriette Bouillon RDCS Referring Phys: [id number] ADEWALE A OLALERE IMPRESSIONS  1. Left ventricular ejection fraction, by estimation, is 50 to 55%. The left ventricle has low normal function. The left ventricle has no regional wall motion abnormalities. There is mild left ventricular hypertrophy. Left ventricular diastolic parameters are consistent with Grade I diastolic dysfunction (impaired relaxation).  2. Right ventricular systolic function is severely reduced. The right ventricular size is moderately enlarged. There is severely elevated pulmonary artery systolic pressure.  3. Right atrial size was  severely dilated.  4. The mitral valve is normal in structure. Mild mitral valve regurgitation. No evidence of mitral stenosis.  5. The aortic valve is tricuspid. Aortic valve regurgitation is not visualized. No aortic stenosis is present.  6. The inferior vena cava is dilated in size with <50% respiratory variability, suggesting right atrial pressure of 15 mmHg. Comparison(s): No prior Echocardiogram. FINDINGS  Left Ventricle: Left ventricular ejection fraction, by estimation, is 50 to 55%. The left ventricle has low normal function. The left ventricle has no regional wall motion abnormalities. The left ventricular internal cavity size was normal in size. There is mild left ventricular hypertrophy. Left ventricular diastolic parameters are consistent with Grade I diastolic dysfunction (impaired relaxation). Right Ventricle: The right ventricular size is moderately enlarged. Right ventricular systolic function is severely reduced. There is severely elevated pulmonary artery systolic pressure. The tricuspid regurgitant velocity is 3.37 m/s, and with  an assumed right atrial pressure of 15 mmHg, the estimated right ventricular systolic pressure is 60.4 mmHg. Left Atrium: Left atrial size was normal in size. Right Atrium: Right atrial size was severely dilated. Pericardium: There is no evidence of pericardial effusion. Mitral Valve: The mitral valve is normal in structure. Mild mitral valve regurgitation. No evidence of mitral valve stenosis. Tricuspid Valve: The tricuspid valve is normal in structure. Tricuspid valve regurgitation is mild . No evidence of tricuspid stenosis. Aortic Valve: The aortic valve is tricuspid. Aortic valve regurgitation is not visualized. No aortic stenosis is present. Pulmonic Valve: The pulmonic valve was normal in structure. Pulmonic valve regurgitation is mild. No evidence of pulmonic stenosis. Aorta: The aortic root is normal in size and structure. Venous: The inferior vena cava is  dilated in size with less than 50% respiratory variability, suggesting right atrial pressure of 15 mmHg. IAS/Shunts: No atrial level shunt detected by color flow Doppler.  LEFT VENTRICLE PLAX 2D LVIDd:         4.80 cm   Diastology LVIDs:         4.00 cm   LV e' medial:  3.92 cm/s LV PW:         1.20 cm   LV e' lateral: 8.59 cm/s LV IVS:        1.20 cm LVOT diam:     2.20 cm LV SV:         56 LV SV Index:   28 LVOT Area:     3.80 cm  RIGHT VENTRICLE            IVC RV S prime:     6.42 cm/s  IVC diam: 2.90 cm TAPSE (M-mode): 1.4 cm LEFT ATRIUM           Index        RIGHT ATRIUM           Index LA diam:      3.60 cm 1.82 cm/m   RA Area:     30.90 cm LA Vol (A2C): 56.5 ml 28.56 ml/m  RA Volume:   126.00 ml 63.69 ml/m LA Vol (A4C): 31.3 ml 15.82 ml/m  AORTIC VALVE             PULMONIC VALVE LVOT Vmax:   82.30 cm/s  PR End Diast Vel: 1.43 msec LVOT Vmean:  54.800 cm/s LVOT VTI:    0.147 m  AORTA Ao Root diam: 3.70 cm Ao Asc diam:  3.80 cm TRICUSPID VALVE TR Peak grad:   45.4 mmHg TR Vmax:        337.00 cm/s  SHUNTS Systemic VTI:  0.15 m Systemic Diam: 2.20 cm Olga Millers MD Electronically signed by Olga Millers MD Signature Date/Time: 09/21/2022/12:33:12 PM    Final    CT ABDOMEN PELVIS W CONTRAST  Result Date: 09/21/2022 CLINICAL DATA:  Abdominal pain, dizziness, and headache. Recent diagnosis COVID with new O2 requirement EXAM: CT ANGIOGRAPHY CHEST CT ABDOMEN AND PELVIS WITH CONTRAST TECHNIQUE: Multidetector CT imaging of the chest was performed using the standard protocol during bolus administration of intravenous contrast. Multiplanar CT image reconstructions and MIPs were obtained to evaluate the vascular anatomy. Multidetector CT imaging of the abdomen and pelvis was performed using the standard protocol during bolus administration of intravenous contrast. RADIATION DOSE REDUCTION: This exam was performed according to the departmental dose-optimization program which includes automated exposure  control, adjustment of the mA and/or kV according to patient size and/or use of iterative reconstruction technique. CONTRAST:  OMNIPAQUE IOHEXOL 350 MG/ML SOLN COMPARISON:  Chest radiograph dated 09/21/2022, CT abdomen and pelvis dated 09/14/2019 FINDINGS: CTA CHEST FINDINGS Cardiovascular: The study is high quality for the evaluation of pulmonary embolism. Filling defects within the distal main pulmonary arteries bilaterally extending into all lobes. Main pulmonary artery measures 3.5 cm. Right ventricle to left ventricle ratio > 1. No significant pericardial fluid/thickening. Mediastinum/Nodes: Imaged thyroid gland without nodules meeting criteria for imaging follow-up by size. Normal esophagus. No pathologically enlarged axillary, supraclavicular, mediastinal, or hilar lymph nodes. Lungs/Pleura: The central airways are patent. No focal consolidation. No pneumothorax. No pleural effusion. Musculoskeletal: No acute or abnormal lytic or blastic osseous lesions. Multilevel degenerative changes of the thoracic spine. Review of the MIP images confirms the above findings. CT ABDOMEN and PELVIS FINDINGS Hepatobiliary: No focal hepatic lesions. No intra or extrahepatic biliary ductal dilation. Normal gallbladder. Pancreas: No focal lesions or main ductal dilation. Unchanged focus of coarse calcification in the pancreatic tail Spleen: Normal in size without focal abnormality. Adrenals/Urinary Tract: No adrenal nodules. No suspicious renal mass or hydronephrosis. Two distal left ureteral stones measuring up to 6 mm. Multiple additional bilateral nonobstructing stones. No focal bladder wall thickening. Stomach/Bowel: Normal appearance of the stomach. Small diverticulum arising from the second portion. No evidence of bowel wall thickening, distention, or inflammatory changes. Normal appendix. Vascular/Lymphatic: Aortic atherosclerosis. No enlarged abdominal or pelvic lymph nodes. Reproductive: Enlargement of the  prostate with median lobe hypertrophy. Other: No free fluid, fluid collection, or free air. Musculoskeletal: No acute or abnormal lytic or blastic osseous lesions. Multilevel degenerative changes of the lumbar spine. Unchanged small right inguinal hernia contains a loop of nonobstructed small bowel. Unchanged multiple moderate midline anterior abdominal fat containing hernias. IMPRESSION: 1. Bilateral pulmonary emboli within the distal main pulmonary arteries extending into all lobes with CT findings of right heart strain. 2. Two distal left ureteral stones measuring up to 6 mm. No hydronephrosis. Multiple additional bilateral nonobstructing renal stones. 3. Unchanged small right inguinal hernia contains a loop of nonobstructed small bowel and multiple moderate midline anterior abdominal fat containing hernias. 4. 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 5. Aortic Atherosclerosis (ICD10-I70.0). Critical Value/emergent results were called by telephone at the time of interpretation on 09/21/2022 at 10:55 am to provider Pricilla Loveless , who verbally acknowledged these results. Electronically Signed   By: Agustin Cree M.D.   On: 09/21/2022 11:00   CT Angio Chest PE W and/or Wo Contrast  Result Date: 09/21/2022 CLINICAL DATA:  Abdominal pain, dizziness, and headache. Recent diagnosis COVID with new O2 requirement EXAM: CT ANGIOGRAPHY CHEST CT ABDOMEN AND PELVIS WITH CONTRAST TECHNIQUE: Multidetector CT imaging of the chest was performed using the standard protocol during bolus administration of intravenous contrast. Multiplanar CT image reconstructions and MIPs were obtained to evaluate the vascular anatomy. Multidetector CT imaging of the abdomen and pelvis was performed using the standard protocol during bolus administration of intravenous contrast. RADIATION DOSE REDUCTION: This exam was performed according to the departmental dose-optimization program which includes automated exposure control,  adjustment of the mA and/or kV according to patient size and/or use of iterative reconstruction technique. CONTRAST:  OMNIPAQUE IOHEXOL 350 MG/ML SOLN COMPARISON:  Chest radiograph dated 09/21/2022, CT abdomen and pelvis dated 09/14/2019 FINDINGS: CTA CHEST FINDINGS Cardiovascular: The study is high quality for the evaluation of pulmonary embolism. Filling defects within the distal main pulmonary arteries bilaterally extending into all lobes. Main pulmonary artery measures 3.5 cm. Right ventricle to left ventricle ratio >  1. No significant pericardial fluid/thickening. Mediastinum/Nodes: Imaged thyroid gland without nodules meeting criteria for imaging follow-up by size. Normal esophagus. No pathologically enlarged axillary, supraclavicular, mediastinal, or hilar lymph nodes. Lungs/Pleura: The central airways are patent. No focal consolidation. No pneumothorax. No pleural effusion. Musculoskeletal: No acute or abnormal lytic or blastic osseous lesions. Multilevel degenerative changes of the thoracic spine. Review of the MIP images confirms the above findings. CT ABDOMEN and PELVIS FINDINGS Hepatobiliary: No focal hepatic lesions. No intra or extrahepatic biliary ductal dilation. Normal gallbladder. Pancreas: No focal lesions or main ductal dilation. Unchanged focus of coarse calcification in the pancreatic tail Spleen: Normal in size without focal abnormality. Adrenals/Urinary Tract: No adrenal nodules. No suspicious renal mass or hydronephrosis. Two distal left ureteral stones measuring up to 6 mm. Multiple additional bilateral nonobstructing stones. No focal bladder wall thickening. Stomach/Bowel: Normal appearance of the stomach. Small diverticulum arising from the second portion. No evidence of bowel wall thickening, distention, or inflammatory changes. Normal appendix. Vascular/Lymphatic: Aortic atherosclerosis. No enlarged abdominal or pelvic lymph nodes. Reproductive: Enlargement of the prostate with  median lobe hypertrophy. Other: No free fluid, fluid collection, or free air. Musculoskeletal: No acute or abnormal lytic or blastic osseous lesions. Multilevel degenerative changes of the lumbar spine. Unchanged small right inguinal hernia contains a loop of nonobstructed small bowel. Unchanged multiple moderate midline anterior abdominal fat containing hernias. IMPRESSION: 1. Bilateral pulmonary emboli within the distal main pulmonary arteries extending into all lobes with CT findings of right heart strain. 2. Two distal left ureteral stones measuring up to 6 mm. No hydronephrosis. Multiple additional bilateral nonobstructing renal stones. 3. Unchanged small right inguinal hernia contains a loop of nonobstructed small bowel and multiple moderate midline anterior abdominal fat containing hernias. 4. 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 5. Aortic Atherosclerosis (ICD10-I70.0). Critical Value/emergent results were called by telephone at the time of interpretation on 09/21/2022 at 10:55 am to provider Pricilla Loveless , who verbally acknowledged these results. Electronically Signed   By: Agustin Cree M.D.   On: 09/21/2022 11:00   DG Chest Portable 1 View  Result Date: 09/21/2022 CLINICAL DATA:  Hypoxia. EXAM: PORTABLE CHEST 1 VIEW COMPARISON:  09/30/2019. FINDINGS: Clear lungs. Stable cardiac and mediastinal contours with tortuosity of the thoracic aorta. No pleural effusion or pneumothorax. Visualized bones and upper abdomen are unremarkable. IMPRESSION: No evidence of acute cardiopulmonary disease. Electronically Signed   By: Orvan Falconer M.D.   On: 09/21/2022 09:25      LOS: 1 day   Jerald Kief, MD, PhD [facility] Resident  [facility]    09/22/2022, 8:36 AM

## 2022-09-22 NOTE — H&P (Addendum)
Note entered on 09/21/22 as consult note is patients history and physical exam note

## 2022-09-22 NOTE — Progress Notes (Signed)
NAME:  [name], MRN:  [id number], DOB:  09-22-1940, LOS: 1 ADMISSION DATE:  09/21/2022, CONSULTATION DATE: 09/21/2022 REFERRING MD: Criss Alvine, CHIEF COMPLAINT: Pulmonary emboli  History of Present Illness:  82 year old male w/ h/o afib (not on systemic AC), fibromyalgia, basal cell carcinoma w/ recent skin cancer excisions (~ 2 wks prior) on Back and left face. Just recovered from COVID w/ three negative tests recorded by the pt on  8/9. Was at his son's house on 8/10 when noted new onset of marked exertional shortness of breath to the point he could not really assist even with helping with hanging paintings without feels very short of breath and light headed to the point he felt he might pass out. These symptoms continued to worsen so he called EMS on 8/14 w/ cc: shortness of breath, new abd discomfort, new nausea, and dizziness. Initial pulse ox 70s. Placed on supplemental oxygen. CT chest showing bilateral pulmonary emboli w/ RV/LV ratio of > 1. Initial trop I: 112, BNP 775.7 Critical care asked to eval.  Pertinent  Medical History  Atrial fibrillation, fibromyalgia, ureteral stones, inguinal hernia, enlarged prostate, basal cell carcinoma s/p recent resection, melanoma  Significant Hospital Events: Including procedures, antibiotic start and stop dates in addition to other pertinent events   8/14 CT scan of the chest with bilateral pulmonary emboli, ureteral stones, atelectasis 8/14 CT head-no intracranial masses or lesions 8/14 catheter directed thrombolytics-tolerated for about 5 hours, ongoing hematuria led to discontinuation 8/14 ultrasound-right femoral vein thrombosis 8/14 echocardiogram-severely decreased right ventricular function, diastolic dysfunction, severely elevated pulmonary pressures  Interim History / Subjective:  Hematuria that led to discontinuation of thrombolytics Hypotension, resolved at present Bladder irrigation started now with clear urine  Objective    Blood pressure 112/82, pulse 76, temperature 99.8 F (37.7 C), temperature source Axillary, resp. rate (!) 27, height 6' (1.829 m), weight 80 kg, SpO2 97%.        Intake/Output Summary (Last 24 hours) at 09/22/2022 4098 Last data filed at 09/22/2022 0818 Gross per 24 hour  Intake 16820.35 ml  Output 11914 ml  Net 1770.35 ml   Filed Weights   09/21/22 1037 09/22/22 0500  Weight: 76.2 kg 80 kg    Examination: General: Elderly, frail, chronically ill-appearing HENT: Moist oral mucosa Lungs: Clear breath sounds Cardiovascular: S1-S2 appreciated Abdomen: Full, some tenderness with deep palpation Extremities: No clubbing, no edema Neuro: Alert and oriented x 3, nonfocal exam GU: Clear urine with bladder irrigation ongoing  I reviewed nursing notes, Consultant notes, last 24 h vitals and pain scores, last 48 h intake and output, last 24 h labs and trends, and last 24 h imaging results.  Resolved Hospital Problem list     Assessment & Plan:  Submassive pulmonary embolism -S/p tPA, catheter directed thrombolytics which he tolerated for about 5 hours, limited by hematuria -Hematuria has resolved -Did have a drop in hematocrit, this has stabilized -Will continue heparin No edema  Discussed with IR at bedside this morning, IR plans to assess pulmonary pressures and possibly discontinue catheter today  Hematuria -Drop in hematocrit -Has stabilized  Acute hypoxic respiratory failure secondary to acute submassive PE with pulmonary hypertension -Continue oxygen supplementation -Wean oxygen supplementation as tolerated  Blood loss anemia secondary to his hematuria -This has stabilized  Acute kidney injury -Avoid nephrotoxic -Maintain renal perfusion -Creatinine 1.29-1.15,  Chronic pain and fibromyalgia -Acute pain related to Foley in place -Fentanyl as needed, increase to 25 milligram -Continue Flomax  Hold home  antihypertensives  Will consider abdominal CT scan if  persistent discomfort and swelling of the abdomen Hematocrit has remained stable, hemodynamically stable  Risk of decompensation remains high   Best Practice (right click and "Reselect all SmartList Selections" daily)   Diet/type: Regular consistency (see orders) DVT prophylaxis: systemic heparin GI prophylaxis: N/A Lines: N/A Foley:  Yes, and it is still needed Code Status:  full code Last date of multidisciplinary goals of care discussion [ongoing discomfort ongoing]  Labs   CBC: Recent Labs  Lab 09/21/22 0834 09/21/22 1923 09/21/22 2239 09/22/22 0300 09/22/22 0758  WBC 10.6* 14.3* 13.9* 12.3* 16.3*  NEUTROABS 7.9*  --   --   --   --   HGB 14.3 14.0 11.9* 10.7* 11.6*  HCT 42.3 42.6 36.2* 32.6* 34.3*  MCV 97.7 98.6 98.6 101.2* 98.6  PLT 241 232 195 157 178    Basic Metabolic Panel: Recent Labs  Lab 09/21/22 0834 09/22/22 0300  NA 134* 133*  K 3.8 4.4  CL 98 102  CO2 28 23  GLUCOSE 125* 108*  BUN 18 18  CREATININE 1.29* 1.15  CALCIUM 9.4 8.0*  MG  --  1.8   GFR: Estimated Creatinine Clearance: 55.3 mL/min (by C-G formula based on SCr of 1.15 mg/dL). Recent Labs  Lab 09/21/22 0840 09/21/22 1923 09/21/22 2239 09/22/22 0300 09/22/22 0758  PROCALCITON  --  <0.10  --   --   --   WBC  --  14.3* 13.9* 12.3* 16.3*  LATICACIDVEN 1.1  --   --   --   --     Liver Function Tests: Recent Labs  Lab 09/21/22 0834  AST 27  ALT 19  ALKPHOS 102  BILITOT 1.1  PROT 6.4*  ALBUMIN 3.5   Recent Labs  Lab 09/21/22 0834  LIPASE 23   No results for input(s): "AMMONIA" in the last 168 hours.  ABG No results found for: "PHART", "PCO2ART", "PO2ART", "HCO3", "TCO2", "ACIDBASEDEF", "O2SAT"   Coagulation Profile: Recent Labs  Lab 09/21/22 1052  INR 1.1    Cardiac Enzymes: No results for input(s): "CKTOTAL", "CKMB", "CKMBINDEX", "TROPONINI" in the last 168 hours.  HbA1C: No results found for: "HGBA1C"  CBG: No results for input(s): "GLUCAP" in the last  168 hours.  Review of Systems:   Ongoing pain and discomfort  Past Medical History:  He,  has a past medical history of Arthritis, Brain tumor (HCC), Colonic mass (2001), Fatigue, Fibromyalgia (1990's ), Frequent PVCs, Generalized body aches, H/O inguinal hernia, Hernia, umbilical, History of kidney stones, Hypertension, NSVT (nonsustained ventricular tachycardia) (HCC), Premature atrial contractions, Prolonged Q-T interval on ECG, and Wears dentures.   Surgical History:   Past Surgical History:  Procedure Laterality Date   BRAIN TUMOR EXCISION  age 70   CARDIAC CATHETERIZATION N/A 04/09/2015   Procedure: Left Heart Cath and Coronary Angiography;  Surgeon: Kathleene Hazel, MD;  Location: [facility] INVASIVE CV LAB;  Service: Cardiovascular;  Laterality: N/A;   COLON SURGERY  2001   COLON RESECTION FOR GROWTH - NOT CANCER   CYSTOSCOPY WITH RETROGRADE PYELOGRAM, URETEROSCOPY AND STENT PLACEMENT Left 10/05/2012   Procedure: CYSTOSCOPY WITH RETROGRADE PYELOGRAM, URETEROSCOPY AND STENT PLACEMENT;  Surgeon: Sebastian Ache, MD;  Location: [facility];  Service: Urology;  Laterality: Left;   CYSTOSCOPY/RETROGRADE/URETEROSCOPY/STONE EXTRACTION WITH BASKET Right 10/05/2012   Procedure: CYSTOSCOPY/RETROGRADE/URETEROSCOPY/STONE EXTRACTION WITH BASKET;  Surgeon: Sebastian Ache, MD;  Location: [facility];  Service: Urology;  Laterality: Right;   CYSTOSCOPY/RETROGRADE/URETEROSCOPY/STONE EXTRACTION WITH BASKET Bilateral 10/24/2012  Procedure: CYSTOSCOPY/RETROGRADE/URETEROSCOPY/STONE EXTRACTION WITH BASKET;  Surgeon: Sebastian Ache, MD;  Location: [facility];  Service: Urology;  Laterality: Bilateral;  with bilateral stent placement   HOLMIUM LASER APPLICATION Right 10/05/2012   Procedure: HOLMIUM LASER APPLICATION;  Surgeon: Sebastian Ache, MD;  Location: [facility];  Service: Urology;  Laterality: Right;   HOLMIUM LASER APPLICATION Bilateral 10/24/2012   Procedure: HOLMIUM LASER APPLICATION;  Surgeon: Sebastian Ache, MD;   Location: [facility];  Service: Urology;  Laterality: Bilateral;   LITHOTRIPSY     TONSILLECTOMY  age 61's   and adenoids     Social History:   reports that he has never smoked. He has never used smokeless tobacco. He reports current alcohol use of about 7.0 standard drinks of alcohol per week. He reports that he does not currently use drugs.   Family History:  His family history includes Colon cancer in his father; Heart disease in his father.   Allergies Allergies  Allergen Reactions   Other Other (See Comments)    Mangos - caused a severe rash   Prozac [Fluoxetine Hcl]     Made him feel "loopy"   Sulfa Antibiotics Hives   Vicodin [Hydrocodone-Acetaminophen] Itching    Tolerates oxycodone   Erythromycin Rash    Many years ago an oral antibiotic caused a rash(wife and patient think it was erythromycin but they are not positive).     The patient is critically ill with multiple organ systems failure and requires high complexity decision making for assessment and support, frequent evaluation and titration of therapies, application of advanced monitoring technologies and extensive interpretation of multiple databases. Critical Care Time devoted to patient care services described in this note independent of APP/resident time (if applicable)  is 35 minutes.   Virl Diamond MD [facility] Personal pager: See Amion If unanswered, please page CCM On-call: #[phone]

## 2022-09-22 NOTE — Progress Notes (Signed)
eLink Physician-Brief Progress Note Patient Name: [name] DOB: 19-Apr-1940 MRN: [id number]   Date of Service  09/22/2022  HPI/Events of Note  Patient with completely clear urine on bladder irrigation since the original hematuria noticed on placing a Foley catheter, no bleeding either from vascular access site or wound, hemoglobin is 11.9 gm / dl, down from 14 gm / dl.  eICU Interventions  Pharmacy instructed to resume Heparin gtt without a bolus, bedside RN instructed to monitor bladder irrigant for evidence of recurrence of bleeding and to notify E-link stat.         U  09/22/2022, 1:40 AM

## 2022-09-22 NOTE — Procedures (Signed)
At the patients bedside, bilateral pulmonary artery infusion catheters were accessed and pulled back about 10 cm for a pressure reading. Pulmonary pressure read 52/13 with a mean of 27. Bilateral pulmonary artery infusion catheters were removed following the removal of bilateral 6 Fr sheaths. Patients floor nurse held pressure and verbalized that an occlusive dressing would be placed.

## 2022-09-22 NOTE — Progress Notes (Signed)
CT abdomen ordered to rule out retroperitoneal bleed, evaluate abdominal distention

## 2022-09-22 NOTE — Progress Notes (Signed)
ANTICOAGULATION CONSULT NOTE - Follow Up Consult  Pharmacy Consult for Heparin Indication: pulmonary embolus  Patient Measurements: Height: 6' (182.9 cm) Weight: 80 kg (176 lb 5.9 oz) IBW/kg (Calculated) : 77.6 Heparin Dosing Weight: 76 kg  Medications:  Infusions:   sodium chloride     sodium chloride Stopped (09/22/22 0853)   sodium chloride Stopped (09/22/22 0853)   heparin 1,300 Units/hr (09/22/22 1015)   lactated ringers 125 mL/hr at 09/22/22 1448   sodium chloride irrigation      Brief Assessment (see full note earlier today from Tacy Learn PharmD.) 82 year old male presented to the ED with dizziness, shortness of breath and abdominal pain.  CT Angio + bilateral PE with RHS.  Pharmacy is consulted to dose Heparin   Significant Events 09/21/22 1710: Start bilateral pulmonary artery infusion of alteplase, 1 mg/hr through each catheter x 12 hours for total TPA dose = 24 mg 8/14 1923: First heparin level = 0.64 - therapeutic 8 hours after 1500 unit bolus & drip at 1300 units/hr.  Per notes pt having some hematuria after foley removed 8/14 at 21:28 - tPA stopped due to hematuria 8/14 at 22:05 - Heparin infusion stopped due to hematuria 09/22/22 01:35 - Dr Warrick Parisian notified pharmacy and requested heparin infusion be resumed with NO BOLUS  Today, 09/22/2022:  Evening update:  Heparin level 0.46, remains therapeutic on heparin 1300 units/hr CBC:  Hgb down to 10.4, Plt WNL Fibrinogen: 270 Per RN, no major hematuria but urine is light pink, facial bleeding resolved. CT pending for rule out retroperitoneal bleed.   Goal of Therapy:  Heparin level 0.3-0.7 units/ml Monitor platelets by anticoagulation protocol: Yes   Plan:  Continue heparin IV infusion at 1300 units/hr Heparin level in 6 hours. Daily heparin level and CBC   Lynann Beaver PharmD, BCPS [facility] pharmacy [phone] 09/22/2022 3:28 PM

## 2022-09-22 NOTE — Plan of Care (Signed)
Restraints not applied and cancelled, patient was not pulling at foley. Per MD, only use if patient is pulling at foley and interfering with care. Problem: Education: Goal: Knowledge of General Education information will improve Description: Including pain rating scale, medication(s)/side effects and non-pharmacologic comfort measures Outcome: Progressing   Problem: Health Behavior/Discharge Planning: Goal: Ability to manage health-related needs will improve Outcome: Progressing   Problem: Clinical Measurements: Goal: Will remain free from infection Outcome: Progressing Goal: Respiratory complications will improve Outcome: Progressing Goal: Cardiovascular complication will be avoided Outcome: Progressing   Problem: Nutrition: Goal: Adequate nutrition will be maintained Outcome: Progressing   Problem: Elimination: Goal: Will not experience complications related to bowel motility Outcome: Progressing   Problem: Safety: Goal: Ability to remain free from injury will improve Outcome: Progressing   Problem: Skin Integrity: Goal: Risk for impaired skin integrity will decrease Outcome: Progressing

## 2022-09-23 ENCOUNTER — Encounter (HOSPITAL_COMMUNITY): Payer: Self-pay | Admitting: *Deleted

## 2022-09-23 DIAGNOSIS — I2609 Other pulmonary embolism with acute cor pulmonale: Secondary | ICD-10-CM | POA: Diagnosis not present

## 2022-09-23 LAB — CBC
HCT: 27.1 % — ABNORMAL LOW (ref 39.0–52.0)
Hemoglobin: 9 g/dL — ABNORMAL LOW (ref 13.0–17.0)
MCH: 33.5 pg (ref 26.0–34.0)
MCHC: 33.2 g/dL (ref 30.0–36.0)
MCV: 100.7 fL — ABNORMAL HIGH (ref 80.0–100.0)
Platelets: 150 10*3/uL (ref 150–400)
RBC: 2.69 MIL/uL — ABNORMAL LOW (ref 4.22–5.81)
RDW: 12.8 % (ref 11.5–15.5)
WBC: 11.3 10*3/uL — ABNORMAL HIGH (ref 4.0–10.5)
nRBC: 0 % (ref 0.0–0.2)

## 2022-09-23 LAB — BRAIN NATRIURETIC PEPTIDE: B Natriuretic Peptide: 626.8 pg/mL — ABNORMAL HIGH (ref 0.0–100.0)

## 2022-09-23 LAB — HEPARIN LEVEL (UNFRACTIONATED): Heparin Unfractionated: 0.56 [IU]/mL (ref 0.30–0.70)

## 2022-09-23 MED ORDER — FENTANYL CITRATE PF 50 MCG/ML IJ SOSY
25.0000 ug | PREFILLED_SYRINGE | INTRAMUSCULAR | Status: DC | PRN
  Administered 2022-09-23 – 2022-09-30 (×28): 25 ug via INTRAVENOUS
  Filled 2022-09-23 (×29): qty 1

## 2022-09-23 MED ORDER — "THROMBI-PAD 3""X3"" EX PADS"
1.0000 | MEDICATED_PAD | Freq: Once | CUTANEOUS | Status: DC
  Filled 2022-09-23: qty 1

## 2022-09-23 MED ORDER — OXIDIZED CELLULOSE EX PADS
1.0000 | MEDICATED_PAD | Freq: Once | CUTANEOUS | Status: AC
  Administered 2022-09-23: 1 via TOPICAL
  Filled 2022-09-23: qty 1

## 2022-09-23 MED ORDER — LIDOCAINE HCL URETHRAL/MUCOSAL 2 % EX GEL
1.0000 | CUTANEOUS | Status: DC | PRN
  Administered 2022-09-23 – 2022-09-28 (×15): 1 via URETHRAL
  Filled 2022-09-23 (×16): qty 5

## 2022-09-23 MED ORDER — AQUAPHOR EX OINT: TOPICAL_OINTMENT | Freq: Every day | CUTANEOUS | Status: AC | PRN

## 2022-09-23 MED ORDER — OXYCODONE HCL 5 MG PO TABS
5.0000 mg | ORAL_TABLET | ORAL | Status: DC | PRN
  Administered 2022-09-23 – 2022-09-30 (×20): 5 mg via ORAL
  Filled 2022-09-23 (×21): qty 1

## 2022-09-23 MED ORDER — POLYETHYLENE GLYCOL 3350 17 G PO PACK
17.0000 g | PACK | Freq: Every day | ORAL | Status: DC
  Administered 2022-09-23 – 2022-09-28 (×5): 17 g via ORAL
  Filled 2022-09-23 (×6): qty 1

## 2022-09-23 NOTE — Plan of Care (Addendum)
  Problem: Pain Managment: Goal: General experience of comfort will improve Outcome: Progressing   Problem: Skin Integrity: Goal: Risk for impaired skin integrity will decrease Outcome: Progressing   

## 2022-09-23 NOTE — Plan of Care (Signed)
  Problem: Nutrition: Goal: Adequate nutrition will be maintained Outcome: Progressing   Problem: Pain Managment: Goal: General experience of comfort will improve Outcome: Progressing   

## 2022-09-23 NOTE — Progress Notes (Signed)
Patient is followed by Dr [facility] urology F/up with Dr Berneice Heinrich for tx of slow flow, hematuria and ureteral stones

## 2022-09-23 NOTE — Progress Notes (Addendum)
NAME:  [name], MRN:  [id number], DOB:  [birth year], LOS: 2 ADMISSION DATE:  09/21/2022, CONSULTATION DATE: 09/21/2022 REFERRING MD: Criss Alvine, CHIEF COMPLAINT: Pulmonary emboli  History of Present Illness:  82 year old male w/ h/o afib (not on systemic AC), fibromyalgia, basal cell carcinoma w/ recent skin cancer excisions (~ 2 wks prior) on Back and left face. Just recovered from COVID w/ three negative tests recorded by the pt on  8/9. Was at his son's house on 8/10 when noted new onset of marked exertional shortness of breath to the point he could not really assist even with helping with hanging paintings without feels very short of breath and light headed to the point he felt he might pass out. These symptoms continued to worsen so he called EMS on 8/14 w/ cc: shortness of breath, new abd discomfort, new nausea, and dizziness. Initial pulse ox 70s. Placed on supplemental oxygen. CT chest showing bilateral pulmonary emboli w/ RV/LV ratio of > 1. Initial trop I: 112, BNP 775.7 Critical care asked to eval.  Pertinent  Medical History  Atrial fibrillation, fibromyalgia, ureteral stones, inguinal hernia, enlarged prostate, basal cell carcinoma s/p recent resection, melanoma  Significant Hospital Events: Including procedures, antibiotic start and stop dates in addition to other pertinent events   8/14 Catheter directed thrombolytics-tolerated for about 5 hours, ongoing hematuria led to discontinuation CT scan of the chest with bilateral pulmonary emboli, ureteral stones, atelectasis CT head-no intracranial masses or lesions Lower extremity doppler with acute right femoral vein thrombosis Echocardiogram, EF 50-55%, severely decreased right ventricular function, diastolic dysfunction, severely elevated pulmonary pressures 8/16 CBI in place with pink tinged urine seen, hemodynamically stable on assessment but BP drops with IV opioids   Interim History / Subjective:  Reports improvement in  dyspnea, continues to report pain at Foley insertion site   Objective   Blood pressure 132/77, pulse (!) 164, temperature 97.6 F (36.4 C), temperature source Oral, resp. rate (!) 26, height 6' (1.829 m), weight 80 kg, SpO2 99%.        Intake/Output Summary (Last 24 hours) at 09/23/2022 0800 Last data filed at 09/23/2022 0700 Gross per 24 hour  Intake 5199.28 ml  Output 3900 ml  Net 1299.28 ml   Filed Weights   09/21/22 1037 09/22/22 0500  Weight: 76.2 kg 80 kg    Examination: General: Acute on chronically ill appearing elderly male lying in bed, in NAD HEENT: [place]/AT, MM pink/moist, PERRL,  Neuro: Alert and oriented x3, non-focal  CV: s1s2 regular rate and rhythm, no murmur, rubs, or gallops,  PULM:  Clear to auscultation, no increased work of breathing, no added breath sounds, on 5L [place] GI: soft, bowel sounds active in all 4 quadrants, non-tender, non-distended, tolerating oral diet  Extremities: warm/dry, no edema  Skin: no rashes or lesions  Resolved Hospital Problem list   AKI  Assessment & Plan:  Submassive pulmonary embolism -S/p tPA, catheter directed thrombolytics which he tolerated for about 5 hours, limited by hematuria Acute hypoxic respiratory failure secondary to acute submassive PE with pulmonary hypertension P: Continue Heparin  Transition to DOAC once haematuria resolved  Continue supplemental oxygen for sat goal > 90  Mobilize  Encourage pulmonary hygiene  Minimize sedation  Aspiration precaution   Hematuria post catheter directed lytics  -CT A/P negative for RP bleed 8/15 Left kidney stone  Hx of BPH P: Urology consulted, appreciate assistance  Outpatient urology follow up  Keep foley x1 week  Continue Flomax Pain control  Blood loss anemia secondary to his hematuria P: H&H remains stable.  Trend CBC   Chronic pain and fibromyalgia -Acute pain from Foley  P: PRN Fentanyl   Essential hypertension  Hyperlipidemia  -Home medications  include ASA, Lipitor, Coreg,  P: Continuous telemetry  Resume home medications when appropriate   Continue to observe in ICU today if remains stable can likely downgrade to stepdown with transition to [facility] tomorrow    Best Practice (right click and "Reselect all SmartList Selections" daily)   Diet/type: Regular consistency (see orders) DVT prophylaxis: systemic heparin GI prophylaxis: N/A Lines: N/A Foley:  Yes, and it is still needed Code Status:  full code Last date of multidisciplinary goals of care discussion [ongoing discomfort ongoing]  Tenzin Edelman D. Harris, NP-C [facility] Personal contact information can be found on Amion  If no contact or response made please call [phone] 09/23/2022, 8:49 AM

## 2022-09-23 NOTE — Progress Notes (Signed)
Reviewed the chart.  I will speak to our resident also today Spoke with nursing and family  Catheter has been draining well and by history I think he irrigated nicely today without clots.  I will double check this.  Still has pink or red Kool-Aid urine draining well  He still is bleeding around the Foley catheter.  When I examined him he does have some red blood around the meatus in keeping with urethral and sometimes prostate bleeding.  There was no clot in the bladder on recent CT scan  Patient said he passed a stone a week ago.  In my opinion he still has 2 distal ureteral stones left ureter not dictated on the report.  At baseline patient voids with a slow flow on Flomax once a day.  Normally voids every 1 or 2 hours gets up once at night.  No history of bladder or prostate surgery  Patient is followed in our office by urologist but did not know his name  The patient will be managed for urethral bleeding by nurses.  Continue to manage Foley catheter with CBI as needed.  Eventually should have trial of voiding on Flomax.  Will need cystoscopy in the future.  Will need management of left ureteral stones that are asymptomatic.  He has a mild decrease in his hemoglobin but likely not related to current blood in urine which is less severe

## 2022-09-23 NOTE — Consult Note (Signed)
Subjective: Patient is catheter placed on traction overnight because of bleeding that was started up from transferring for a CT scan.  Doing well now with Foley catheter off traction.  Urine pink on a moderate drip CBI.  We are able to wean however patient is still on heparin drip which is not allowing [place] to slow down CBI as fast as would like.  Patient having some urethral bleeding.  Otherwise he is tolerating the catheter okay.  CT scan with bladder decompressed with Foley catheter in place.  Still evidence of left ureteral stones.  Objective: Vital signs in last 24 hours: Temp:  [97.6 F (36.4 C)-98.8 F (37.1 C)] 98.8 F (37.1 C) (08/16 1200) Pulse Rate:  [29-164] 30 (08/16 1112) Resp:  [17-31] 25 (08/16 1200) BP: (71-135)/(46-81) 107/75 (08/16 1200) SpO2:  [91 %-100 %] 94 % (08/16 1200)  Assessment/Plan: 82 year old man currently admitted for bilateral PEs in the setting of having COVID 10 days ago, anticoagulated who likely had traumatic Foley placement hematuria.  Hematuria improved with traction.  # Hematuria and retention -Continue CBI at this time.  Will hold off on traction at this time given patient's discomfort. -Patient has urethral bleeding.  This should likely slow down over time. -If patient having bladder spasms, okay for Ditropan.  #Incidental finding of left distal kidney stone -Patient will need follow-up for left ureteroscopy.  At that time we can evaluate the bladder.  Given he does not have any flank pain and his UA is negative, no need for stent procedure. Intake/Output from previous day: 08/15 0701 - 08/16 0700 In: 8229.3 [P.O.:740; I.V.:3359.3] Out: 5500 [Urine:5500]  Intake/Output this shift: Total I/O In: 3153.8 [P.O.:720; I.V.:433.8; Other:2000] Out: 1400 [Urine:1400]  Physical Exam:  General: Alert and oriented CV: No cyanosis Lungs: equal chest rise Abdomen: Soft, NTND, no rebound or guarding Skin: Gu: 3 way catheter in place.  Urine  pink on moderate drip CBI with catheter off traction  Lab Results: Recent Labs    09/22/22 0758 09/22/22 1450 09/23/22 0307  HGB 11.6* 10.4* 9.0*  HCT 34.3* 31.3* 27.1*   BMET Recent Labs    09/21/22 0834 09/22/22 0300  NA 134* 133*  K 3.8 4.4  CL 98 102  CO2 28 23  GLUCOSE 125* 108*  BUN 18 18  CREATININE 1.29* 1.15  CALCIUM 9.4 8.0*     Studies/Results: IR Angiogram Pulmonary Bilateral Selective  Result Date: 09/23/2022 INDICATION: 82 year old with submassive pulmonary embolism. Right heart strain. Plan for catheter directed thrombolysis of bilateral pulmonary emboli. EXAM: 1. Placement of bilateral pulmonary artery infusion catheters 2. Bilateral pulmonary angiography 3. Pulmonary artery pressure measurement 4. Ultrasound guidance for vascular access x 2 COMPARISON:  Chest CTA 09/21/2022 MEDICATIONS: Moderate sedation ANESTHESIA/SEDATION: Moderate (conscious) sedation was employed during this procedure. A total of Versed 0.5 mg and Fentanyl 25 mcg was administered intravenously by the radiology nurse. Total intra-service moderate Sedation Time: 54 minutes. The patient's level of consciousness and vital signs were monitored continuously by radiology nursing throughout the procedure under my direct supervision. FLUOROSCOPY: Radiation Exposure Index (as provided by the fluoroscopic device): 63 mGy Kerma CONTRAST:  10 mL Omnipaque 300 COMPLICATIONS: None immediate. TECHNIQUE: Informed written consent was obtained from the patient after a thorough discussion of the procedural risks, benefits and alternatives. All questions were addressed. A timeout was performed prior to the initiation of the procedure. Patient was placed supine. Ultrasound confirmed a patent right internal jugular vein. Ultrasound image was saved for fracture  documentation. Right neck was prepped and draped in sterile fashion. Maximal barrier sterile technique was utilized including caps, mask, sterile gowns, sterile  gloves, sterile drape, hand hygiene and skin antiseptic. Skin was anesthetized in the right neck with 1% lidocaine. Using ultrasound guidance, 21 gauge needle was directed into the right internal jugular vein and micropuncture dilator set was placed. Second micropuncture dilator set was placed into the right internal jugular vein using ultrasound guidance. Both micropuncture dilator sets were upsized to a 6 [place] vascular sheath. A C2 catheter was advanced through 1 of the sheath and directed into the right side of the heart and main pulmonary artery. Pulmonary artery pressure was obtained. Catheter was advanced into the left pulmonary artery. Catheter was removed over a Rosen wire. C2 catheter was advanced through the other sheath into the right heart and right pulmonary artery. Catheter was advanced into the right interlobar artery region and contrast injection confirmed adequate placement within a right pulmonary artery. A 90 cm, 10 cm infusion length, UniFuse catheter was advanced over the Rosen wire and positioned in the right pulmonary artery. A second 90 cm, 10 cm infusion length, UniFuse catheter was advanced over the left pulmonary artery wire into the left pulmonary artery. Contrast injection confirmed adequate placement in left pulmonary artery. Occluding wires were placed in both infusion catheters. Both catheters were flushed with saline and tPA was started through both catheters. Both sheaths were sutured to skin. Bandage was placed over the right neck. FINDINGS: Main pulmonary artery pressure was 58/13 mmHg, mean 31. Right pulmonary artery infusion catheter tip terminated in a right lower lobe branch. Left pulmonary artery catheter tip extends into the left pulmonary artery. IMPRESSION: 1. Successful placement of bilateral pulmonary artery catheters for catheter-directed thrombolysis. 2. Elevated pulmonary artery pressure measuring 58/13 mm Hg, mean 31. Electronically Signed   By: Richarda Overlie M.D.    On: 09/23/2022 11:41   CT ABDOMEN PELVIS WO CONTRAST  Result Date: 09/22/2022 CLINICAL DATA:  Abdominal distension, rule out retroperitoneal bleed post pulmonary artery thrombolysis EXAM: CT ABDOMEN AND PELVIS WITHOUT CONTRAST TECHNIQUE: Multidetector CT imaging of the abdomen and pelvis was performed following the standard protocol without IV contrast. RADIATION DOSE REDUCTION: This exam was performed according to the departmental dose-optimization program which includes automated exposure control, adjustment of the mA and/or kV according to patient size and/or use of iterative reconstruction technique. COMPARISON:  the previous day's study FINDINGS: Lower chest: New subsegmental airspace consolidation in the dependent aspect of both lower lobes, left worse than right. Hepatobiliary: No focal liver abnormality is seen. No gallstones, gallbladder wall thickening, or biliary dilatation. Pancreas: Unremarkable. No pancreatic ductal dilatation or surrounding inflammatory changes. Spleen: Normal in size without focal abnormality. Adrenals/Urinary Tract: Adrenal glands are unremarkable. Kidneys are normal, without renal calculi, focal lesion, or hydronephrosis. Residual contrast in the collecting systems from previous administration. Urinary bladder decompressed by Foley catheter. Stomach/Bowel: Stomach decompressed. Small bowel is nondilated. A loop of small bowel protrudes into right inguinal hernia without obstruction or strangulation. Normal appendix. Colon is incompletely distended, with a few scattered distal descending diverticula; no adjacent inflammatory change. Vascular/Lymphatic: Mild scattered aortoiliac calcified plaque without aneurysm. Reproductive: Prostate enlargement Other: No retroperitoneal hematoma.  No ascites.  No free air. Musculoskeletal: Ventral and periumbilical hernias. Right inguinal hernia containing a loop of small bowel, without obstruction or strangulation. IMPRESSION: 1. No  retroperitoneal hematoma. 2. Subsegmental bibasilar airspace consolidation, left worse than right. 3. Right inguinal hernia containing a loop of small  bowel, without obstruction or strangulation. 4.  Aortic Atherosclerosis (ICD10-I70.0). Electronically Signed   By: Corlis Leak M.D.   On: 09/22/2022 17:08   IR US Guide Vasc Access Right  Result Date: 09/22/2022 INDICATION: 82 year old with submassive pulmonary embolism. Right heart strain. Plan for catheter directed thrombolysis of bilateral pulmonary emboli. EXAM: 1. Placement of bilateral pulmonary artery infusion catheters 2. Bilateral pulmonary angiography 3. Pulmonary artery pressure measurement 4. Ultrasound guidance for vascular access x 2 COMPARISON:  Chest CTA 09/21/2022 MEDICATIONS: Moderate sedation ANESTHESIA/SEDATION: Moderate (conscious) sedation was employed during this procedure. A total of Versed 0.5 mg and Fentanyl 25 mcg was administered intravenously by the radiology nurse. Total intra-service moderate Sedation Time: 54 minutes. The patient's level of consciousness and vital signs were monitored continuously by radiology nursing throughout the procedure under my direct supervision. FLUOROSCOPY: Radiation Exposure Index (as provided by the fluoroscopic device): 63 mGy Kerma CONTRAST:  10 mL Omnipaque 300 COMPLICATIONS: None immediate. TECHNIQUE: Informed written consent was obtained from the patient after a thorough discussion of the procedural risks, benefits and alternatives. All questions were addressed. A timeout was performed prior to the initiation of the procedure. Patient was placed supine. Ultrasound confirmed a patent right internal jugular vein. Ultrasound image was saved for fracture documentation. Right neck was prepped and draped in sterile fashion. Maximal barrier sterile technique was utilized including caps, mask, sterile gowns, sterile gloves, sterile drape, hand hygiene and skin antiseptic. Skin was anesthetized in the right  neck with 1% lidocaine. Using ultrasound guidance, 21 gauge needle was directed into the right internal jugular vein and micropuncture dilator set was placed. Second micropuncture dilator set was placed into the right internal jugular vein using ultrasound guidance. Both micropuncture dilator sets were upsized to a 6 [place] vascular sheath. A C2 catheter was advanced through 1 of the sheath and directed into the right side of the heart and main pulmonary artery. Pulmonary artery pressure was obtained. Catheter was advanced into the left pulmonary artery. Catheter was removed over a Rosen wire. C2 catheter was advanced through the other sheath into the right heart and right pulmonary artery. Catheter was advanced into the right interlobar artery region and contrast injection confirmed adequate placement within a right pulmonary artery. A 90 cm, 10 cm infusion length, UniFuse catheter was advanced over the Rosen wire and positioned in the right pulmonary artery. A second 90 cm, 10 cm infusion length, UniFuse catheter was advanced over the left pulmonary artery wire into the left pulmonary artery. Contrast injection confirmed adequate placement in left pulmonary artery. Occluding wires were placed in both infusion catheters. Both catheters were flushed with saline and tPA was started through both catheters. Both sheaths were sutured to skin. Bandage was placed over the right neck. FINDINGS: Main pulmonary artery pressure was 58/13 mmHg, mean 31. Right pulmonary artery infusion catheter tip terminated in a right lower lobe branch. Left pulmonary artery catheter tip extends into the left pulmonary artery. IMPRESSION: 1. Successful placement of bilateral pulmonary artery catheters for catheter-directed thrombolysis. 2. Elevated pulmonary artery pressure measuring 58/13 mm Hg, mean 31. Electronically Signed   By: Richarda Overlie M.D.   On: 09/22/2022 13:09   IR INFUSION THROMBOL ARTERIAL INITIAL (MS)  Result Date:  09/22/2022 INDICATION: 82 year old with submassive pulmonary embolism. Right heart strain. Plan for catheter directed thrombolysis of bilateral pulmonary emboli. EXAM: 1. Placement of bilateral pulmonary artery infusion catheters 2. Bilateral pulmonary angiography 3. Pulmonary artery pressure measurement 4. Ultrasound guidance for vascular access x 2  COMPARISON:  Chest CTA 09/21/2022 MEDICATIONS: Moderate sedation ANESTHESIA/SEDATION: Moderate (conscious) sedation was employed during this procedure. A total of Versed 0.5 mg and Fentanyl 25 mcg was administered intravenously by the radiology nurse. Total intra-service moderate Sedation Time: 54 minutes. The patient's level of consciousness and vital signs were monitored continuously by radiology nursing throughout the procedure under my direct supervision. FLUOROSCOPY: Radiation Exposure Index (as provided by the fluoroscopic device): 63 mGy Kerma CONTRAST:  10 mL Omnipaque 300 COMPLICATIONS: None immediate. TECHNIQUE: Informed written consent was obtained from the patient after a thorough discussion of the procedural risks, benefits and alternatives. All questions were addressed. A timeout was performed prior to the initiation of the procedure. Patient was placed supine. Ultrasound confirmed a patent right internal jugular vein. Ultrasound image was saved for fracture documentation. Right neck was prepped and draped in sterile fashion. Maximal barrier sterile technique was utilized including caps, mask, sterile gowns, sterile gloves, sterile drape, hand hygiene and skin antiseptic. Skin was anesthetized in the right neck with 1% lidocaine. Using ultrasound guidance, 21 gauge needle was directed into the right internal jugular vein and micropuncture dilator set was placed. Second micropuncture dilator set was placed into the right internal jugular vein using ultrasound guidance. Both micropuncture dilator sets were upsized to a 6 [place] vascular sheath. A C2 catheter  was advanced through 1 of the sheath and directed into the right side of the heart and main pulmonary artery. Pulmonary artery pressure was obtained. Catheter was advanced into the left pulmonary artery. Catheter was removed over a Rosen wire. C2 catheter was advanced through the other sheath into the right heart and right pulmonary artery. Catheter was advanced into the right interlobar artery region and contrast injection confirmed adequate placement within a right pulmonary artery. A 90 cm, 10 cm infusion length, UniFuse catheter was advanced over the Rosen wire and positioned in the right pulmonary artery. A second 90 cm, 10 cm infusion length, UniFuse catheter was advanced over the left pulmonary artery wire into the left pulmonary artery. Contrast injection confirmed adequate placement in left pulmonary artery. Occluding wires were placed in both infusion catheters. Both catheters were flushed with saline and tPA was started through both catheters. Both sheaths were sutured to skin. Bandage was placed over the right neck. FINDINGS: Main pulmonary artery pressure was 58/13 mmHg, mean 31. Right pulmonary artery infusion catheter tip terminated in a right lower lobe branch. Left pulmonary artery catheter tip extends into the left pulmonary artery. IMPRESSION: 1. Successful placement of bilateral pulmonary artery catheters for catheter-directed thrombolysis. 2. Elevated pulmonary artery pressure measuring 58/13 mm Hg, mean 31. Electronically Signed   By: Richarda Overlie M.D.   On: 09/22/2022 13:09   IR INFUSION THROMBOL ARTERIAL INITIAL (MS)  Result Date: 09/22/2022 INDICATION: 82 year old with submassive pulmonary embolism. Right heart strain. Plan for catheter directed thrombolysis of bilateral pulmonary emboli. EXAM: 1. Placement of bilateral pulmonary artery infusion catheters 2. Bilateral pulmonary angiography 3. Pulmonary artery pressure measurement 4. Ultrasound guidance for vascular access x 2 COMPARISON:   Chest CTA 09/21/2022 MEDICATIONS: Moderate sedation ANESTHESIA/SEDATION: Moderate (conscious) sedation was employed during this procedure. A total of Versed 0.5 mg and Fentanyl 25 mcg was administered intravenously by the radiology nurse. Total intra-service moderate Sedation Time: 54 minutes. The patient's level of consciousness and vital signs were monitored continuously by radiology nursing throughout the procedure under my direct supervision. FLUOROSCOPY: Radiation Exposure Index (as provided by the fluoroscopic device): 63 mGy Kerma CONTRAST:  10 mL Omnipaque  300 COMPLICATIONS: None immediate. TECHNIQUE: Informed written consent was obtained from the patient after a thorough discussion of the procedural risks, benefits and alternatives. All questions were addressed. A timeout was performed prior to the initiation of the procedure. Patient was placed supine. Ultrasound confirmed a patent right internal jugular vein. Ultrasound image was saved for fracture documentation. Right neck was prepped and draped in sterile fashion. Maximal barrier sterile technique was utilized including caps, mask, sterile gowns, sterile gloves, sterile drape, hand hygiene and skin antiseptic. Skin was anesthetized in the right neck with 1% lidocaine. Using ultrasound guidance, 21 gauge needle was directed into the right internal jugular vein and micropuncture dilator set was placed. Second micropuncture dilator set was placed into the right internal jugular vein using ultrasound guidance. Both micropuncture dilator sets were upsized to a 6 [place] vascular sheath. A C2 catheter was advanced through 1 of the sheath and directed into the right side of the heart and main pulmonary artery. Pulmonary artery pressure was obtained. Catheter was advanced into the left pulmonary artery. Catheter was removed over a Rosen wire. C2 catheter was advanced through the other sheath into the right heart and right pulmonary artery. Catheter was advanced  into the right interlobar artery region and contrast injection confirmed adequate placement within a right pulmonary artery. A 90 cm, 10 cm infusion length, UniFuse catheter was advanced over the Rosen wire and positioned in the right pulmonary artery. A second 90 cm, 10 cm infusion length, UniFuse catheter was advanced over the left pulmonary artery wire into the left pulmonary artery. Contrast injection confirmed adequate placement in left pulmonary artery. Occluding wires were placed in both infusion catheters. Both catheters were flushed with saline and tPA was started through both catheters. Both sheaths were sutured to skin. Bandage was placed over the right neck. FINDINGS: Main pulmonary artery pressure was 58/13 mmHg, mean 31. Right pulmonary artery infusion catheter tip terminated in a right lower lobe branch. Left pulmonary artery catheter tip extends into the left pulmonary artery. IMPRESSION: 1. Successful placement of bilateral pulmonary artery catheters for catheter-directed thrombolysis. 2. Elevated pulmonary artery pressure measuring 58/13 mm Hg, mean 31. Electronically Signed   By: Richarda Overlie M.D.   On: 09/22/2022 13:09   IR THROMB F/U EVAL ART/VEN FINAL DAY (MS)  Result Date: 09/22/2022 Assunta Found, RT     09/22/2022  9:55 AM At the patients bedside, bilateral pulmonary artery infusion catheters were accessed and pulled back about 10 cm for a pressure reading. Pulmonary pressure read 52/13 with a mean of 27. Bilateral pulmonary artery infusion catheters were removed following the removal of bilateral 6 Fr sheaths. Patients floor nurse held pressure and verbalized that an occlusive dressing would be placed.   VAS [place] LOWER EXTREMITY VENOUS (DVT)  Result Date: 09/22/2022  Lower Venous DVT Study Patient Name:  GERVIS HOOTER  Date of Exam:   09/21/2022 Medical Rec #: [id number]          Accession #:    [id number] Date of Birth: Apr 25, 1940           Patient Gender: M Patient Age:   82 years  Exam Location:  [facility] Procedure:      VAS [place] LOWER EXTREMITY VENOUS (DVT) Referring Phys: Zenia Resides --------------------------------------------------------------------------------  Indications: Pulmonary embolism.  Risk Factors: Confirmed PE. Anticoagulation: Heparin. Comparison Study: No prior studies. Performing Technologist: Chanda Busing RVT  Examination Guidelines: A complete evaluation includes B-mode imaging, spectral Doppler, color Doppler, and power  Doppler as needed of all accessible portions of each vessel. Bilateral testing is considered an integral part of a complete examination. Limited examinations for reoccurring indications may be performed as noted. The reflux portion of the exam is performed with the patient in reverse Trendelenburg.  +---------+---------------+---------+-----------+----------+--------------+ RIGHT    CompressibilityPhasicitySpontaneityPropertiesThrombus Aging +---------+---------------+---------+-----------+----------+--------------+ CFV      Full           Yes      Yes                                 +---------+---------------+---------+-----------+----------+--------------+ SFJ      Full                                                        +---------+---------------+---------+-----------+----------+--------------+ FV Prox  Partial        No       No                   Acute          +---------+---------------+---------+-----------+----------+--------------+ FV Mid   Full                                                        +---------+---------------+---------+-----------+----------+--------------+ FV DistalFull                                                        +---------+---------------+---------+-----------+----------+--------------+ PFV      Full                                                        +---------+---------------+---------+-----------+----------+--------------+ POP      Full            Yes      Yes                                 +---------+---------------+---------+-----------+----------+--------------+ PTV      Full                                                        +---------+---------------+---------+-----------+----------+--------------+ PERO     Full                                                        +---------+---------------+---------+-----------+----------+--------------+   +---------+---------------+---------+-----------+----------+--------------+ LEFT     CompressibilityPhasicitySpontaneityPropertiesThrombus Aging +---------+---------------+---------+-----------+----------+--------------+ CFV  Full           Yes      No                                  +---------+---------------+---------+-----------+----------+--------------+ SFJ      Full                                                        +---------+---------------+---------+-----------+----------+--------------+ FV Prox  Full                                                        +---------+---------------+---------+-----------+----------+--------------+ FV Mid   Full                                                        +---------+---------------+---------+-----------+----------+--------------+ FV DistalFull                                                        +---------+---------------+---------+-----------+----------+--------------+ PFV      Full                                                        +---------+---------------+---------+-----------+----------+--------------+ POP      Full           Yes      Yes                                 +---------+---------------+---------+-----------+----------+--------------+ PTV      Full                                                        +---------+---------------+---------+-----------+----------+--------------+ PERO     Full                                                         +---------+---------------+---------+-----------+----------+--------------+     Summary: RIGHT: - Findings consistent with acute deep vein thrombosis involving the right femoral vein. - No cystic structure found in the popliteal fossa.  LEFT: - There is no evidence of deep vein thrombosis in the lower extremity.  - No cystic structure found in the popliteal fossa.  *See table(s) above for measurements and  observations. Electronically signed by Sherald Hess MD on 09/22/2022 at 9:46:41 AM.    Final    CT HEAD W & WO CONTRAST ( )  Result Date: 09/21/2022 CLINICAL DATA:  Melanoma.  Pulmonary embolus. EXAM: CT HEAD WITHOUT AND WITH CONTRAST TECHNIQUE: Contiguous axial images were obtained from the base of the skull through the vertex without and with intravenous contrast. RADIATION DOSE REDUCTION: This exam was performed according to the departmental dose-optimization program which includes automated exposure control, adjustment of the mA and/or kV according to patient size and/or use of iterative reconstruction technique. CONTRAST:  70mL OMNIPAQUE IOHEXOL 300 MG/ML  SOLN COMPARISON:  CT head without contrast and MR head without contrast 08/10/2019 FINDINGS: Brain: No acute infarct, hemorrhage, or mass lesion is present. Scattered subcortical white matter hypoattenuation is present bilaterally. A Deep brain nuclei are within normal limits. The ventricles are of normal size. No significant extraaxial fluid collection is present. Vascular: Minimal calcifications are present within the cavernous internal carotid arteries. Contrast is present within the blood vessels. No asymmetric hyperdensity is present. Skull: Calvarium is intact. No focal lytic or blastic lesions are present. No significant extracranial soft tissue lesion is present. Sinuses/Orbits: The paranasal sinuses and mastoid air cells are clear. The globes and orbits are within normal limits. IMPRESSION: 1. No acute or focal lesion to suggest  metastatic disease. 2. No acute hemorrhage. 3. Scattered subcortical white matter hypoattenuation bilaterally. 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 Electronically Signed   By: Marin Roberts M.D.   On: 09/21/2022 14:42      LOS: 2 days   Jerald Kief, MD, PhD [facility] Resident  PGY4 [facility]    09/23/2022, 1:09 PM

## 2022-09-23 NOTE — Consult Note (Signed)
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 Daphine Deutscher in the skin surgery center 3 and 2 weeks respectively.  Needs further debridement of his nose as well. Planning to follow up with plastic surgeon at [facility] that operated on his nose previously  Wound type: surgical  Pressure Injury POA: NA Measurement: Temporal area; see nursing notes Mid back; 3cm x 3cm x 0.5cm  Wound bed: Temporal: bloody; patient is on heparin for submassive PE Midback: pale, non granular Drainage (amount, consistency, odor) see above; back wound has yellow drainage but does not acutely appear infected  Periwound: intact; scarring of the face from previous surgery  Dressing procedure/placement/frequency: Aquaphor ointment daily Single layer of Vaseline gauze.  Top with foam Change daily   Discussed POC with patient and bedside nurse.  Re consult if needed, will not follow at this time. Thanks  Kara Melching [employer], RN,CWOCN, CNS, CWON-AP [phone])

## 2022-09-23 NOTE — Progress Notes (Signed)
Per chart review patient currently in [facility] ICU for pulmonary emboli. TOC brief assessment completed  No current TOC needs.    09/23/22 1729  TOC Brief Assessment  Insurance and Status Reviewed  Patient has primary care physician Yes  Home environment has been reviewed from home with spouse  Prior level of function: independent  Prior/Current Home Services No current home services  Social Determinants of Health Reivew SDOH reviewed no interventions necessary  Readmission risk has been reviewed Yes  Transition of care needs no transition of care needs at this time

## 2022-09-23 NOTE — Progress Notes (Signed)
ANTICOAGULATION CONSULT NOTE Pharmacy Consult for Heparin Indication: pulmonary embolus  Patient Measurements: Height: 6' (182.9 cm) Weight: 80 kg (176 lb 5.9 oz) IBW/kg (Calculated) : 77.6 Heparin Dosing Weight: 76 kg  Medications:  Infusions:   sodium chloride     sodium chloride Stopped (09/22/22 0853)   sodium chloride Stopped (09/22/22 0853)   heparin 1,300 Units/hr (09/22/22 2151)   lactated ringers 125 mL/hr at 09/22/22 2151   sodium chloride irrigation      Brief Assessment (see full note earlier today from Tacy Learn PharmD.) 82 year old male presented to the ED with dizziness, shortness of breath and abdominal pain.  CT Angio + bilateral PE with RHS.  Pharmacy is consulted to dose Heparin   Significant Events 09/21/22 1710: Start bilateral pulmonary artery infusion of alteplase, 1 mg/hr through each catheter x 12 hours for total TPA dose = 24 mg 8/14 1923: First heparin level = 0.64 - therapeutic 8 hours after 1500 unit bolus & drip at 1300 units/hr.  Per notes pt having some hematuria after foley removed 8/14 at 21:28 - tPA stopped due to hematuria 8/14 at 22:05 - Heparin infusion stopped due to hematuria 09/22/22 01:35 - Dr Warrick Parisian notified pharmacy and requested heparin infusion be resumed with NO BOLUS  Today, 09/23/2022:   Heparin level 0.56, remains therapeutic on heparin 1300 units/hr CBC:  Hgb down to 9, Pltc at low end of normal Fibrinogen: 270 Per RN, no bleeding or infusion related issues.  CT negative for retroperitoneal bleed.   Goal of Therapy:  Heparin level 0.3-0.7 units/ml Monitor platelets by anticoagulation protocol: Yes   Plan:  Continue heparin IV infusion at 1300 units/hr Daily heparin level and CBC  Junita Push, PharmD, BCPS 09/23/2022 3:55 AM

## 2022-09-23 NOTE — Progress Notes (Signed)
PT Cancellation Note  Patient Details Name: [name] MRN: [id number] DOB: Mar 29, 1940   Cancelled Treatment:     PT order received but eval deferred, RN advises pt on continuous bladder irrigation and with significant penile pain.  Will follow.   [name] 09/23/2022, 10:46 AM

## 2022-09-24 ENCOUNTER — Encounter (HOSPITAL_COMMUNITY): Payer: Self-pay | Admitting: *Deleted

## 2022-09-24 DIAGNOSIS — I2609 Other pulmonary embolism with acute cor pulmonale: Secondary | ICD-10-CM | POA: Diagnosis not present

## 2022-09-24 LAB — CBC
HCT: 25.9 % — ABNORMAL LOW (ref 39.0–52.0)
HCT: 27.8 % — ABNORMAL LOW (ref 39.0–52.0)
Hemoglobin: 8.4 g/dL — ABNORMAL LOW (ref 13.0–17.0)
Hemoglobin: 9.2 g/dL — ABNORMAL LOW (ref 13.0–17.0)
MCH: 32.9 pg (ref 26.0–34.0)
MCH: 33.5 pg (ref 26.0–34.0)
MCHC: 32.4 g/dL (ref 30.0–36.0)
MCHC: 33.1 g/dL (ref 30.0–36.0)
MCV: 101.6 fL — ABNORMAL HIGH (ref 80.0–100.0)
MCV: 101.1 fL — ABNORMAL HIGH (ref 80.0–100.0)
Platelets: 188 10*3/uL (ref 150–400)
Platelets: 232 10*3/uL (ref 150–400)
RBC: 2.55 MIL/uL — ABNORMAL LOW (ref 4.22–5.81)
RBC: 2.75 MIL/uL — ABNORMAL LOW (ref 4.22–5.81)
RDW: 12.9 % (ref 11.5–15.5)
RDW: 12.7 % (ref 11.5–15.5)
WBC: 10.2 10*3/uL (ref 4.0–10.5)
WBC: 8.9 10*3/uL (ref 4.0–10.5)
nRBC: 0 % (ref 0.0–0.2)
nRBC: 0 % (ref 0.0–0.2)

## 2022-09-24 LAB — MAGNESIUM: Magnesium: 2.2 mg/dL (ref 1.7–2.4)

## 2022-09-24 LAB — GLUCOSE, CAPILLARY: Glucose-Capillary: 93 mg/dL (ref 70–99)

## 2022-09-24 LAB — BASIC METABOLIC PANEL
Anion gap: 8 (ref 5–15)
BUN: 19 mg/dL (ref 8–23)
CO2: 24 mmol/L (ref 22–32)
Calcium: 7.7 mg/dL — ABNORMAL LOW (ref 8.9–10.3)
Chloride: 98 mmol/L (ref 98–111)
Creatinine, Ser: 1.27 mg/dL — ABNORMAL HIGH (ref 0.61–1.24)
GFR, Estimated: 57 mL/min — ABNORMAL LOW (ref >60)
Glucose, Bld: 120 mg/dL — ABNORMAL HIGH (ref 70–99)
Potassium: 4.5 mmol/L (ref 3.5–5.1)
Sodium: 130 mmol/L — ABNORMAL LOW (ref 135–145)

## 2022-09-24 LAB — BRAIN NATRIURETIC PEPTIDE: B Natriuretic Peptide: 1139.2 pg/mL — ABNORMAL HIGH (ref 0.0–100.0)

## 2022-09-24 LAB — HEPARIN LEVEL (UNFRACTIONATED): Heparin Unfractionated: 0.44 [IU]/mL (ref 0.30–0.70)

## 2022-09-24 MED ORDER — ONDANSETRON HCL 4 MG PO TABS: 4.0000 mg | ORAL_TABLET | Freq: Four times a day (QID) | ORAL | Status: DC | PRN

## 2022-09-24 MED ORDER — OXIDIZED CELLULOSE EX PADS
1.0000 | MEDICATED_PAD | CUTANEOUS | Status: DC | PRN
  Administered 2022-09-24 (×4): 1 via TOPICAL
  Filled 2022-09-24 (×5): qty 1

## 2022-09-24 MED ORDER — SODIUM CHLORIDE 0.9 % IV SOLN
12.5000 mg | Freq: Four times a day (QID) | INTRAVENOUS | Status: DC | PRN
  Administered 2022-10-02: 12.5 mg via INTRAVENOUS
  Filled 2022-09-24: qty 0.5
  Filled 2022-09-24: qty 12.5
  Filled 2022-09-24 (×2): qty 0.5

## 2022-09-24 MED ORDER — ONDANSETRON HCL 4 MG/2ML IJ SOLN
4.0000 mg | Freq: Four times a day (QID) | INTRAMUSCULAR | Status: DC | PRN
  Administered 2022-09-24 – 2022-10-05 (×11): 4 mg via INTRAVENOUS
  Filled 2022-09-24 (×11): qty 2

## 2022-09-24 NOTE — Plan of Care (Signed)
  Problem: Clinical Measurements: Goal: Ability to maintain clinical measurements within normal limits will improve Outcome: Not Progressing Goal: Diagnostic test results will improve Outcome: Not Progressing   Problem: Elimination: Goal: Will not experience complications related to urinary retention Outcome: Not Progressing

## 2022-09-24 NOTE — Plan of Care (Signed)
  Problem: Education: Goal: Knowledge of General Education information will improve Description: Including pain rating scale, medication(s)/side effects and non-pharmacologic comfort measures Outcome: Progressing   Problem: Clinical Measurements: Goal: Ability to maintain clinical measurements within normal limits will improve Outcome: Progressing Goal: Diagnostic test results will improve Outcome: Progressing Goal: Respiratory complications will improve Outcome: Progressing   Problem: Nutrition: Goal: Adequate nutrition will be maintained Outcome: Progressing   Problem: Coping: Goal: Level of anxiety will decrease Outcome: Progressing   Problem: Safety: Goal: Ability to remain free from injury will improve Outcome: Progressing   Problem: Elimination: Goal: Will not experience complications related to bowel motility Outcome: Not Progressing

## 2022-09-24 NOTE — Progress Notes (Signed)
PT Cancellation Note  Patient Details Name: [name] MRN: [id number] DOB: 1940-06-18   Cancelled Treatment:      PT order received but eval deferred, RN advises pt with soft BP, on continuous bladder irrigation and with significant penile pain.  Will follow.    Jandiel Magallanes 09/24/2022, 12:52 PM

## 2022-09-24 NOTE — Progress Notes (Signed)
Patient ID: [name], male   DOB: 1941/01/14, 82 y.o.   MRN: [id number]     Subjective: [name] voices no complaints.  The CBI bags were empty and the drainage was minimally pink in the tube but his nurse states that [name] continues to have red urine.  ROS:  Review of Systems  All other systems reviewed and are negative.   Anti-infectives: Anti-infectives (From admission, onward)    None       Current Facility-Administered Medications  Medication Dose Route Frequency Provider Last Rate Last Admin   0.9 %  sodium chloride infusion  250 mL Intravenous PRN Richarda Overlie, MD       0.9 %  sodium chloride infusion   Intravenous Continuous Richarda Overlie, MD   Stopped at 09/22/22 0853   0.9 %  sodium chloride infusion   Intravenous Continuous Richarda Overlie, MD   Stopped at 09/22/22 0853   buPROPion (WELLBUTRIN XL) 24 hr tablet 150 mg  150 mg Oral Daily Simonne Martinet, NP   150 mg at 09/24/22 1025   Chlorhexidine Gluconate Cloth 2 % PADS 6 each  6 each Topical Daily Olalere, Adewale A, MD   6 each at 09/23/22 0800   docusate sodium (COLACE) capsule 100 mg  100 mg Oral BID PRN Simonne Martinet, NP   100 mg at 09/23/22 1336   fentaNYL (SUBLIMAZE) injection 25 mcg  25 mcg Intravenous Q2H PRN Janeann Forehand D, NP   25 mcg at 09/24/22 0800   heparin ADULT infusion 100 units/mL (25000 units/217mL)  1,300 Units/hr Intravenous Continuous Migdalia Dk, MD 13 mL/hr at 09/24/22 1100 1,300 Units/hr at 09/24/22 1100   lidocaine (XYLOCAINE) 2 % jelly 1 Application  1 Application Urethral PRN Janeann Forehand D, NP   1 Application at 09/24/22 0759   mineral oil-hydrophilic petrolatum (AQUAPHOR) ointment   Topical Daily PRN Mannam, Praveen, MD       ondansetron (ZOFRAN) injection 4 mg  4 mg Intravenous Q8H Olalere, Adewale A, MD   4 mg at 09/24/22 0602   ondansetron (ZOFRAN) tablet 4 mg  4 mg Oral Q8H PRN Simonne Martinet, NP   4 mg at 09/24/22 1030   Oral care mouth rinse  15 mL Mouth Rinse PRN  Olalere, Adewale A, MD       oxidized cellulose (Surgicel) pad 1 each  1 each Topical PRN Narda Bonds, MD       oxybutynin (DITROPAN) tablet 5 mg  5 mg Oral TID Karie Fetch P, DO   5 mg at 09/24/22 1025   oxyCODONE (Oxy IR/ROXICODONE) immediate release tablet 5 mg  5 mg Oral Q4H PRN Janeann Forehand D, NP   5 mg at 09/24/22 0615   polyethylene glycol (MIRALAX / GLYCOLAX) packet 17 g  17 g Oral Daily Janeann Forehand D, NP   17 g at 09/24/22 1025   sodium chloride flush (NS) 0.9 % injection 3 mL  3 mL Intravenous Q12H Richarda Overlie, MD   3 mL at 09/24/22 1026   sodium chloride flush (NS) 0.9 % injection 3 mL  3 mL Intravenous PRN Richarda Overlie, MD       sodium chloride irrigation 0.9 % 3,000 mL  3,000 mL Irrigation Continuous Matthew-Onabanjo, [place], MD   3,000 mL at 09/24/22 0750   tamsulosin (FLOMAX) capsule 0.4 mg  0.4 mg Oral Daily Simonne Martinet, NP   0.4 mg at 09/24/22 1025     Objective: Vital signs in  last 24 hours: Temp:  [97.6 F (36.4 C)-98.8 F (37.1 C)] 97.9 F (36.6 C) (08/17 0837) Pulse Rate:  [34-99] 63 (08/17 1100) Resp:  [14-28] 18 (08/17 1100) BP: (80-138)/(46-85) 112/85 (08/17 1100) SpO2:  [88 %-100 %] 99 % (08/17 1100) Weight:  [81.3 kg] 81.3 kg (08/17 0500)  Intake/Output from previous day: 08/16 0701 - 08/17 0700 In: 12847.6 [P.O.:1170; I.V.:677.6] Out: 29562 [Urine:13350] Intake/Output this shift: Total I/O In: 6052 [I.V.:52; Other:6000] Out: 2750 [Urine:2750]   Physical Exam Vitals reviewed.  Genitourinary:    Comments: Urine was minimally pink in foley tubing. There is bloody drainage around the catheter.  Neurological:     Mental Status: [name] is alert.     Lab Results:  Recent Labs    09/23/22 0307 09/24/22 0224  WBC 11.3* 10.2  HGB 9.0* 8.4*  HCT 27.1* 25.9*  PLT 150 188   BMET Recent Labs    09/22/22 0300  NA 133*  K 4.4  CL 102  CO2 23  GLUCOSE 108*  BUN 18  CREATININE 1.15  CALCIUM 8.0*   PT/INR No results for input(s):  "LABPROT", "INR" in the last 72 hours. ABG No results for input(s): "PHART", "HCO3" in the last 72 hours.  Invalid input(s): "PCO2", "PO2"  Studies/Results: CT ABDOMEN PELVIS WO CONTRAST  Result Date: 09/22/2022 CLINICAL DATA:  Abdominal distension, rule out retroperitoneal bleed post pulmonary artery thrombolysis EXAM: CT ABDOMEN AND PELVIS WITHOUT CONTRAST TECHNIQUE: Multidetector CT imaging of the abdomen and pelvis was performed following the standard protocol without IV contrast. RADIATION DOSE REDUCTION: This exam was performed according to the departmental dose-optimization program which includes automated exposure control, adjustment of the mA and/or kV according to patient size and/or use of iterative reconstruction technique. COMPARISON:  the previous day's study FINDINGS: Lower chest: New subsegmental airspace consolidation in the dependent aspect of both lower lobes, left worse than right. Hepatobiliary: No focal liver abnormality is seen. No gallstones, gallbladder wall thickening, or biliary dilatation. Pancreas: Unremarkable. No pancreatic ductal dilatation or surrounding inflammatory changes. Spleen: Normal in size without focal abnormality. Adrenals/Urinary Tract: Adrenal glands are unremarkable. Kidneys are normal, without renal calculi, focal lesion, or hydronephrosis. Residual contrast in the collecting systems from previous administration. Urinary bladder decompressed by Foley catheter. Stomach/Bowel: Stomach decompressed. Small bowel is nondilated. A loop of small bowel protrudes into right inguinal hernia without obstruction or strangulation. Normal appendix. Colon is incompletely distended, with a few scattered distal descending diverticula; no adjacent inflammatory change. Vascular/Lymphatic: Mild scattered aortoiliac calcified plaque without aneurysm. Reproductive: Prostate enlargement Other: No retroperitoneal hematoma.  No ascites.  No free air. Musculoskeletal: Ventral and  periumbilical hernias. Right inguinal hernia containing a loop of small bowel, without obstruction or strangulation. IMPRESSION: 1. No retroperitoneal hematoma. 2. Subsegmental bibasilar airspace consolidation, left worse than right. 3. Right inguinal hernia containing a loop of small bowel, without obstruction or strangulation. 4.  Aortic Atherosclerosis (ICD10-I70.0). Electronically Signed   By: Corlis Leak M.D.   On: 09/22/2022 17:08     Assessment and Plan: Gross hematuria from foley trauma and anticoagulation.  The urine was minimally pink in the tubing with empty CBI bags but [name] has still been bloody per his RN.   [name] has some per catheter bleeding as well.  Will continue traction and CBI for now.    Acute blood loss anemia.  His Hgb is down further to 8.4 from 9.  [name] remains on heparin.  If the bleeding persists, would need to consider filter  vs continued heparin.       LOS: 3 days    Bjorn Pippin 09/24/2022

## 2022-09-24 NOTE — Hospital Course (Addendum)
[name] is a 82 y.o. male with a history of BPH, hypertension, hyperlipidemia.  Patient presented secondary to marked exertional dyspnea with feeling that he was going to pass out.  On presentation, he was found to have evidence of a submassive pulmonary embolism.  PCCM was consulted and patient admitted to the ICU.  IR consult for the catheter directed thrombolysis for which she tolerated about 5 hours of treatment prior to development of gross hematuria.  Patient transition to heparin IV and urology consulted for management of gross hematuria.  Foley catheter placed with CBI initiated for management.  Echocardiogram confirmed right heart strain.

## 2022-09-24 NOTE — Progress Notes (Signed)
PROGRESS NOTE    [name]  WGN:[id number] DOB: Feb 08, 1940 DOA: 09/21/2022 PCP: Shirline Frees, NP   Brief Narrative: [name] is a 82 y.o. male with a history of BPH, hypertension, hyperlipidemia.  Patient presented secondary to marked exertional dyspnea with feeling that he was going to pass out.  On presentation, he was found to have evidence of a submassive pulmonary embolism.  PCCM was consulted and patient admitted to the ICU.  IR consult for the catheter directed thrombolysis for which she tolerated about 5 hours of treatment prior to development of gross hematuria.  Patient transition to heparin IV and urology consulted for management of gross hematuria.  Foley catheter placed with CBI initiated for management.  Echocardiogram confirmed right heart strain.   Assessment and Plan:  Submassive pulmonary embolism Diagnosed via CTA chest.  Associated respiratory failure.  Patient was admitted to the ICU and IR consulted for directed thrombolytics.  Echocardiogram this admission confirmed associated right heart strain with evidence of severely reduced right ventricular function and moderately large right ventricular size. Directed thrombolytics tolerated for about 5 hours prior to development of gross hematuria requiring cessation.  Patient transitioned to heparin IV. -Continue heparin IV until hematuria has ceased -If able to tolerate heparin IV, will transition to Eliquis prior to discharge -If unable to tolerate heparin IV secondary to persistent hematuria, will need to consider IVC filter and discontinuation of anticoagulation  Acute respiratory failure with hypoxia Secondary to pulmonary embolism. -Wean to room air as able  Gross hematuria Occurring post catheter directed thrombolytics.  CT abdomen pelvis obtained and was significant for no retroperitoneal bleed.  Neurology consulted and Foley placed for continuous bladder irrigation.  Recommendation to keep Foley in  for at least 1 week and patient continued on Flomax. -Urology recommendations  Acute blood loss anemia Secondary to gross hematuria in setting of anticoagulation use.  Hemoglobin of 14 on admission.  Persistent downtrend with hemoglobin of 8.4 this morning.  Patient with continued hemorrhaging. -Transfuse for hemoglobin less than 8.  Paroxysmal atrial fibrillation Patient is on Coreg as an outpatient.  He is not on a coagulation.  Elevated creatinine Baseline creatinine appears to be around 1-1.3.  Last creatinine of 1.28 from September 2023.  Patient does not meet criteria for AKI at this time.  History of BPH -Continue Flomax  Chronic pain Fibromyalgia -Continue oxycodone as needed  Primary hypertension Patient is on carvedilol, lisinopril as an outpatient, which were held on admission.  Hyperlipidemia Patient is on Lipitor 10 mg daily as an outpatient although listed as not taking.   DVT prophylaxis: Heparin IV Code Status:   Code Status: Full Code Family Communication: None at bedside Disposition Plan: Discharge home likely not for 3 to 5 days pending stable hemoglobin, ability to transition to oral anticoagulation if able versus IR consult for IVC filter placement   Consultants:  PCCM Interventional radiology Urology  Procedures:  Catheter directed thrombolytics Transthoracic echocardiogram Foley catheter placement  Antimicrobials: None   Subjective: Pain with foley catheter. Some dyspnea and chest tightness which has overall improved since admission.  Objective: BP 121/81   Pulse 75   Temp 97.9 F (36.6 C) (Oral)   Resp (!) 22   Ht 6' (1.829 m)   Wt 81.3 kg   SpO2 99%   BMI 24.31 kg/m   Examination:  General exam: Appears calm and comfortable Respiratory system: Clear to auscultation. Respiratory effort normal. Cardiovascular system: S1 & S2 heard, RRR. No murmurs,  rubs, gallops or clicks. Gastrointestinal system: Abdomen is distended, soft and  mildly tender. Normal bowel sounds heard. Central nervous system: Alert and oriented. Musculoskeletal: No edema. No calf tenderness Psychiatry: Judgement and insight appear normal. Mood & affect appropriate.    Data Reviewed: I have personally reviewed following labs and imaging studies  CBC Lab Results  Component Value Date   WBC 10.2 09/24/2022   RBC 2.55 (L) 09/24/2022   HGB 8.4 (L) 09/24/2022   HCT 25.9 (L) 09/24/2022   MCV 101.6 (H) 09/24/2022   MCH 32.9 09/24/2022   PLT 188 09/24/2022   MCHC 32.4 09/24/2022   RDW 12.9 09/24/2022   LYMPHSABS 1.3 09/21/2022   MONOABS 1.2 (H) 09/21/2022   EOSABS 0.1 09/21/2022   BASOSABS 0.0 09/21/2022     Last metabolic panel Lab Results  Component Value Date   NA 133 (L) 09/22/2022   K 4.4 09/22/2022   CL 102 09/22/2022   CO2 23 09/22/2022   BUN 18 09/22/2022   CREATININE 1.15 09/22/2022   GLUCOSE 108 (H) 09/22/2022   GFRNONAA >60 09/22/2022   GFRAA 82 01/17/2020   CALCIUM 8.0 (L) 09/22/2022   PROT 6.4 (L) 09/21/2022   ALBUMIN 3.5 09/21/2022   LABGLOB 2.1 07/26/2016   AGRATIO 2.0 07/26/2016   BILITOT 1.1 09/21/2022   ALKPHOS 102 09/21/2022   AST 27 09/21/2022   ALT 19 09/21/2022   ANIONGAP 8 09/22/2022    GFR: Estimated Creatinine Clearance: 55.3 mL/min (by C-G formula based on SCr of 1.15 mg/dL).  Recent Results (from the past 240 hour(s))  Culture, blood (routine x 2)     Status: None (Preliminary result)   Collection Time: 09/21/22  8:49 AM   Specimen: Left Antecubital; Blood  Result Value Ref Range Status   Specimen Description   Final    LEFT ANTECUBITAL Performed at [facility], [address], [place], [place] [address]    Special Requests   Final    BOTTLES DRAWN AEROBIC AND ANAEROBIC Blood Culture adequate volume Performed at [facility], [address] [place], [place] [address]    Culture   Final    NO GROWTH 3 DAYS Performed at [facility], [address], [place], [place] [address]    Report Status PENDING  Incomplete  Culture, blood (routine x 2)     Status: None (Preliminary result)   Collection Time: 09/21/22  9:00 AM   Specimen: Right Antecubital; Blood  Result Value Ref Range Status   Specimen Description   Final    RIGHT ANTECUBITAL Performed at [facility], [address] [place], [place] [address]    Special Requests   Final    BOTTLES DRAWN AEROBIC AND ANAEROBIC Blood Culture adequate volume Performed at [facility], [address] [place], [place] [address]    Culture   Final    NO GROWTH 3 DAYS Performed at [facility], [address] [place], [place] [address]    Report Status PENDING  Incomplete  MRSA Next Gen by PCR, Nasal     Status: None   Collection Time: 09/21/22  2:35 PM   Specimen: Nasal Swab  Result Value Ref Range Status   MRSA by PCR Next Gen NOT DETECTED NOT DETECTED Final    Comment: (NOTE) The GeneXpert MRSA Assay (FDA approved for NASAL specimens only), is one component of a comprehensive MRSA colonization surveillance program. It is not intended to diagnose  MRSA infection nor to guide or monitor treatment for MRSA infections. Test performance is not FDA approved in patients less than 39 years old. Performed at [facility], [address] [place], [place] [address]       Radiology Studies: CT ABDOMEN PELVIS WO CONTRAST  Result Date: 09/22/2022 CLINICAL DATA:  Abdominal distension, rule out retroperitoneal bleed post pulmonary artery thrombolysis EXAM: CT ABDOMEN AND PELVIS WITHOUT CONTRAST TECHNIQUE: Multidetector CT imaging of the abdomen and pelvis was performed following the standard protocol without IV contrast. RADIATION DOSE REDUCTION: This exam was performed according to the departmental dose-optimization program which includes automated exposure control, adjustment of the mA and/or kV according to patient size and/or  use of iterative reconstruction technique. COMPARISON:  the previous day's study FINDINGS: Lower chest: New subsegmental airspace consolidation in the dependent aspect of both lower lobes, left worse than right. Hepatobiliary: No focal liver abnormality is seen. No gallstones, gallbladder wall thickening, or biliary dilatation. Pancreas: Unremarkable. No pancreatic ductal dilatation or surrounding inflammatory changes. Spleen: Normal in size without focal abnormality. Adrenals/Urinary Tract: Adrenal glands are unremarkable. Kidneys are normal, without renal calculi, focal lesion, or hydronephrosis. Residual contrast in the collecting systems from previous administration. Urinary bladder decompressed by Foley catheter. Stomach/Bowel: Stomach decompressed. Small bowel is nondilated. A loop of small bowel protrudes into right inguinal hernia without obstruction or strangulation. Normal appendix. Colon is incompletely distended, with a few scattered distal descending diverticula; no adjacent inflammatory change. Vascular/Lymphatic: Mild scattered aortoiliac calcified plaque without aneurysm. Reproductive: Prostate enlargement Other: No retroperitoneal hematoma.  No ascites.  No free air. Musculoskeletal: Ventral and periumbilical hernias. Right inguinal hernia containing a loop of small bowel, without obstruction or strangulation. IMPRESSION: 1. No retroperitoneal hematoma. 2. Subsegmental bibasilar airspace consolidation, left worse than right. 3. Right inguinal hernia containing a loop of small bowel, without obstruction or strangulation. 4.  Aortic Atherosclerosis (ICD10-I70.0). Electronically Signed   By: Corlis Leak M.D.   On: 09/22/2022 17:08      LOS: 3 days    Jacquelin Hawking, MD [facility] 09/24/2022, 9:53 AM   If 7PM-7AM, please contact night-coverage [email or web address]

## 2022-09-24 NOTE — Progress Notes (Signed)
ANTICOAGULATION CONSULT NOTE Pharmacy Consult for Heparin Indication: pulmonary embolus  Patient Measurements: Height: 6' (182.9 cm) Weight: 81.3 kg (179 lb 3.7 oz) IBW/kg (Calculated) : 77.6 Heparin Dosing Weight: 76 kg  Medications:  Infusions:   sodium chloride     sodium chloride Stopped (09/22/22 0853)   sodium chloride Stopped (09/22/22 0853)   heparin 1,300 Units/hr (09/24/22 0700)   sodium chloride irrigation      Brief Assessment (see full note earlier today from Tacy Learn PharmD.) 82 year old male presented to the ED with dizziness, shortness of breath and abdominal pain.  CT Angio + bilateral PE with RHS.  Pharmacy is consulted to dose Heparin   Significant Events 09/21/22 1710: Start bilateral pulmonary artery infusion of alteplase, 1 mg/hr through each catheter x 12 hours for total TPA dose = 24 mg 8/14 1923: First heparin level = 0.64 - therapeutic 8 hours after 1500 unit bolus & drip at 1300 units/hr.  Per notes pt having some hematuria after foley removed 8/14 at 21:28 - tPA stopped due to hematuria 8/14 at 22:05 - Heparin infusion stopped due to hematuria 09/22/22 01:35 - Dr Warrick Parisian notified pharmacy and requested heparin infusion be resumed with NO BOLUS  Today, 09/24/2022:   02:24 Heparin level 0.44, remains therapeutic on heparin 1300 units/hr CBC:  Hgb down to 8.4, Pltc stable at low end of normal Fibrinogen: 270 Per RN, the only bleeding is just ongoing bleeding from his catheter and incision sites; but that's been stable since yesterday  CT negative for retroperitoneal bleed.   Goal of Therapy:  Heparin level 0.3-0.7 units/ml Monitor platelets by anticoagulation protocol: Yes   Plan:  Continue heparin IV infusion at 1300 units/hr Monitor daily heparin level, CBC, signs/symptoms of bleeding    Thank you for allowing pharmacy to be a part of this patient's care.  Selinda Eon, PharmD, BCPS Clinical Pharmacist [facility] 09/24/2022 10:13  AM

## 2022-09-25 ENCOUNTER — Inpatient Hospital Stay (HOSPITAL_COMMUNITY): Payer: Medicare Other

## 2022-09-25 DIAGNOSIS — I2609 Other pulmonary embolism with acute cor pulmonale: Secondary | ICD-10-CM | POA: Diagnosis not present

## 2022-09-25 HISTORY — PX: IR IVC FILTER PLMT / S&I /IMG GUID/MOD SED: IMG701

## 2022-09-25 LAB — CBC
HCT: 24 % — ABNORMAL LOW (ref 39.0–52.0)
HCT: 25 % — ABNORMAL LOW (ref 39.0–52.0)
Hemoglobin: 8 g/dL — ABNORMAL LOW (ref 13.0–17.0)
Hemoglobin: 8.4 g/dL — ABNORMAL LOW (ref 13.0–17.0)
MCH: 32.9 pg (ref 26.0–34.0)
MCH: 33.2 pg (ref 26.0–34.0)
MCHC: 33.3 g/dL (ref 30.0–36.0)
MCHC: 33.6 g/dL (ref 30.0–36.0)
MCV: 98.8 fL (ref 80.0–100.0)
MCV: 98.8 fL (ref 80.0–100.0)
Platelets: 233 10*3/uL (ref 150–400)
Platelets: 244 10*3/uL (ref 150–400)
RBC: 2.43 MIL/uL — ABNORMAL LOW (ref 4.22–5.81)
RBC: 2.53 MIL/uL — ABNORMAL LOW (ref 4.22–5.81)
RDW: 12.6 % (ref 11.5–15.5)
RDW: 12.6 % (ref 11.5–15.5)
WBC: 9.1 10*3/uL (ref 4.0–10.5)
WBC: 8 10*3/uL (ref 4.0–10.5)
nRBC: 0 % (ref 0.0–0.2)
nRBC: 0 % (ref 0.0–0.2)

## 2022-09-25 LAB — HEMOGLOBIN AND HEMATOCRIT, BLOOD
HCT: 24.9 % — ABNORMAL LOW (ref 39.0–52.0)
Hemoglobin: 8.4 g/dL — ABNORMAL LOW (ref 13.0–17.0)

## 2022-09-25 LAB — HEPARIN LEVEL (UNFRACTIONATED): Heparin Unfractionated: 0.33 [IU]/mL (ref 0.30–0.70)

## 2022-09-25 LAB — PREPARE RBC (CROSSMATCH)

## 2022-09-25 LAB — BRAIN NATRIURETIC PEPTIDE: B Natriuretic Peptide: 457 pg/mL — ABNORMAL HIGH (ref 0.0–100.0)

## 2022-09-25 MED ORDER — LIDOCAINE HCL 1 % IJ SOLN
INTRAMUSCULAR | Status: AC
  Filled 2022-09-25: qty 20

## 2022-09-25 MED ORDER — MIDAZOLAM HCL 2 MG/2ML IJ SOLN
INTRAMUSCULAR | Status: AC | PRN
  Administered 2022-09-25: 1 mg via INTRAVENOUS

## 2022-09-25 MED ORDER — FENTANYL CITRATE (PF) 100 MCG/2ML IJ SOLN
INTRAMUSCULAR | Status: AC | PRN
  Administered 2022-09-25: 50 ug via INTRAVENOUS

## 2022-09-25 MED ORDER — MIDAZOLAM HCL 2 MG/2ML IJ SOLN
INTRAMUSCULAR | Status: AC
  Filled 2022-09-25: qty 2

## 2022-09-25 MED ORDER — IOHEXOL 300 MG/ML  SOLN
100.0000 mL | Freq: Once | INTRAMUSCULAR | Status: AC | PRN
  Administered 2022-09-25: 45 mL via INTRAVENOUS

## 2022-09-25 MED ORDER — FENTANYL CITRATE (PF) 100 MCG/2ML IJ SOLN
INTRAMUSCULAR | Status: AC
  Filled 2022-09-25: qty 2

## 2022-09-25 MED ORDER — SODIUM CHLORIDE 0.9% IV SOLUTION: Freq: Once | INTRAVENOUS | Status: AC

## 2022-09-25 NOTE — Progress Notes (Signed)
PROGRESS NOTE    KEEGHAN KUDER  ZOX:[id number] DOB: Mar 20, 1940 DOA: 09/21/2022 PCP: Shirline Frees, NP   Brief Narrative: GEN WOLFINGER is a 82 y.o. male with a history of BPH, hypertension, hyperlipidemia.  Patient presented secondary to marked exertional dyspnea with feeling that he was going to pass out.  On presentation, he was found to have evidence of a submassive pulmonary embolism.  PCCM was consulted and patient admitted to the ICU.  IR consult for the catheter directed thrombolysis for which she tolerated about 5 hours of treatment prior to development of gross hematuria.  Patient transition to heparin IV and urology consulted for management of gross hematuria.  Foley catheter placed with CBI initiated for management.  Echocardiogram confirmed right heart strain. Patient with continued bleeding on heparin IV, with significant blood loss anemia requiring cessation of heparin and consult to IR for an IVC filter.   Assessment and Plan:  Submassive pulmonary embolism Diagnosed via CTA chest.  Associated respiratory failure.  Patient was admitted to the ICU and IR consulted for directed thrombolytics.  Echocardiogram this admission confirmed associated right heart strain with evidence of severely reduced right ventricular function and moderately large right ventricular size. Directed thrombolytics tolerated for about 5 hours prior to development of gross hematuria requiring cessation.  Patient transitioned to heparin IV. -Stop heparin IV as he continues to bleed from temporal skin lesion and from hematuria -Discussed with IR, plan for IVC filter placement  Acute respiratory failure with hypoxia Secondary to pulmonary embolism. -Wean to room air as able  Gross hematuria Occurring post catheter directed thrombolytics.  CT abdomen pelvis obtained and was significant for no retroperitoneal bleed.  Neurology consulted and Foley placed for continuous bladder irrigation.  Recommendation  to keep Foley in for at least 1 week and patient continued on Flomax. -Urology recommendations: CBI  Acute blood loss anemia Secondary to gross hematuria in setting of anticoagulation use.  Hemoglobin of 14 on admission.  Persistent downtrend with hemoglobin of 8.4 this morning.  Patient with continued hemorrhaging. -Will transfuse 1 unit of blood secondary to hemoglobin drop to 8 with continued profuse hemorrhaging while on heparin IV -Transfuse for hemoglobin less than 8.  Paroxysmal atrial fibrillation Patient is on Coreg as an outpatient.  He is not on a coagulation as an outpatient. Continues to maintain irregular rhythm at this time.  Elevated creatinine Baseline creatinine appears to be around 1-1.3.  Last creatinine of 1.28 from September 2023.  Patient does not meet criteria for AKI at this time.  History of BPH -Continue Flomax  Chronic pain Fibromyalgia -Continue oxycodone as needed  Primary hypertension Patient is on carvedilol, lisinopril as an outpatient, which were held on admission.  Hyperlipidemia Patient is on Lipitor 10 mg daily as an outpatient although listed as not taking.   DVT prophylaxis: Heparin IV Code Status:   Code Status: Full Code Family Communication: None at bedside Disposition Plan: Discharge home likely not for 3 to 5 days pending stable hemoglobin, ability to transition to oral anticoagulation if able   Consultants:  PCCM Interventional radiology Urology  Procedures:  Catheter directed thrombolytics Transthoracic echocardiogram Foley catheter placement  Antimicrobials: None   Subjective: Profuse bleeding from temporal skin lesion and continued hematuria. CCM evaluated overnight with recommendation for continued attempts at hemostasis with pressure to wound in addition to continuing heparin IV.   Objective: BP 137/82   Pulse (!) 50   Temp 98.2 F (36.8 C) (Oral)   Resp 15  Ht 6' (1.829 m)   Wt 81.3 kg   SpO2 96%   BMI  24.31 kg/m   Examination:  General exam: Appears calm and comfortable Respiratory system: Diminished but clear. Respiratory effort normal. Cardiovascular system: S1 & S2 heard, irregular rhythm with normal rate. Gastrointestinal system: Abdomen is distended, soft and nontender. Normal bowel sounds heard. Central nervous system: Alert. No focal neurological deficits. Musculoskeletal: No edema. No calf tenderness Skin: Left temporal head lesion with overlying blood saturated dressing   Data Reviewed: I have personally reviewed following labs and imaging studies  CBC Lab Results  Component Value Date   WBC 9.1 09/25/2022   RBC 2.43 (L) 09/25/2022   HGB 8.0 (L) 09/25/2022   HCT 24.0 (L) 09/25/2022   MCV 98.8 09/25/2022   MCH 32.9 09/25/2022   PLT 233 09/25/2022   MCHC 33.3 09/25/2022   RDW 12.6 09/25/2022   LYMPHSABS 1.3 09/21/2022   MONOABS 1.2 (H) 09/21/2022   EOSABS 0.1 09/21/2022   BASOSABS 0.0 09/21/2022     Last metabolic panel Lab Results  Component Value Date   NA 130 (L) 09/24/2022   K 4.5 09/24/2022   CL 98 09/24/2022   CO2 24 09/24/2022   BUN 19 09/24/2022   CREATININE 1.27 (H) 09/24/2022   GLUCOSE 120 (H) 09/24/2022   GFRNONAA 57 (L) 09/24/2022   GFRAA 82 01/17/2020   CALCIUM 7.7 (L) 09/24/2022   PROT 6.4 (L) 09/21/2022   ALBUMIN 3.5 09/21/2022   LABGLOB 2.1 07/26/2016   AGRATIO 2.0 07/26/2016   BILITOT 1.1 09/21/2022   ALKPHOS 102 09/21/2022   AST 27 09/21/2022   ALT 19 09/21/2022   ANIONGAP 8 09/24/2022    GFR: Estimated Creatinine Clearance: 50.1 mL/min (A) (by C-G formula based on SCr of 1.27 mg/dL (H)).  Recent Results (from the past 240 hour(s))  Culture, blood (routine x 2)     Status: None (Preliminary result)   Collection Time: 09/21/22  8:49 AM   Specimen: Left Antecubital; Blood  Result Value Ref Range Status   Specimen Description   Final    LEFT ANTECUBITAL Performed at [facility], [address]  [place], [place] [address]    Special Requests   Final    BOTTLES DRAWN AEROBIC AND ANAEROBIC Blood Culture adequate volume Performed at [facility], [address], [place], [place] [address]    Culture   Final    NO GROWTH 4 DAYS Performed at [facility], [address], [place], [place] [address]    Report Status PENDING  Incomplete  Culture, blood (routine x 2)     Status: None (Preliminary result)   Collection Time: 09/21/22  9:00 AM   Specimen: Right Antecubital; Blood  Result Value Ref Range Status   Specimen Description   Final    RIGHT ANTECUBITAL Performed at [facility], [address], [place], [place] [address]    Special Requests   Final    BOTTLES DRAWN AEROBIC AND ANAEROBIC Blood Culture adequate volume Performed at [facility], [address] [place], [place] [address]    Culture   Final    NO GROWTH 4 DAYS Performed at [facility], [address], [place], [place] [address]    Report Status PENDING  Incomplete  MRSA Next Gen by PCR, Nasal     Status: None   Collection Time: 09/21/22  2:35 PM   Specimen: Nasal Swab  Result Value  Ref Range Status   MRSA by PCR Next Gen NOT DETECTED NOT DETECTED Final    Comment: (NOTE) The GeneXpert MRSA Assay (FDA approved for NASAL specimens only), is one component of a comprehensive MRSA colonization surveillance program. It is not intended to diagnose MRSA infection nor to guide or monitor treatment for MRSA infections. Test performance is not FDA approved in patients less than 25 years old. Performed at [facility], [address], [place], [place] [address]       Radiology Studies: No results found.    LOS: 4 days    Jacquelin Hawking, MD [facility] 09/25/2022, 7:58 AM   If 7PM-7AM, please contact night-coverage [email or web address]

## 2022-09-25 NOTE — Progress Notes (Signed)
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  Hgb 2200 was stable.    eICU Interventions  Hold prolonged pressure.  Dressing changes. Repeat hgb with am labs. Continue heparin for now If bleeding continues may need IVC filter and hold heparin     Intervention Category Intermediate Interventions: Bleeding - evaluation and treatment with blood products  Henry Russel, P 09/25/2022, 1:32 AM

## 2022-09-25 NOTE — Procedures (Signed)
Interventional Radiology Procedure Note  Procedure: Placement of IVC filter via right IJ  Complications: None  Estimated Blood Loss: None  Recommendations: - bedrest with HOB elevated   Signed,  Sterling Big, MD

## 2022-09-25 NOTE — Progress Notes (Signed)
ANTICOAGULATION CONSULT NOTE Pharmacy Consult for Heparin Indication: pulmonary embolus  Patient Measurements: Height: 6' (182.9 cm) Weight: 81.3 kg (179 lb 3.7 oz) IBW/kg (Calculated) : 77.6 Heparin Dosing Weight: 76 kg  Medications:  Infusions:   sodium chloride     sodium chloride Stopped (09/22/22 0853)   sodium chloride Stopped (09/22/22 0853)   heparin 1,300 Units/hr (09/25/22 0400)   promethazine (PHENERGAN) injection (IM or IVPB)     sodium chloride irrigation      Brief Assessment (see full note earlier today from Tacy Learn PharmD.) 82 year old male presented to the ED with dizziness, shortness of breath and abdominal pain.  CT Angio + bilateral PE with RHS.  Pharmacy is consulted to dose Heparin   Significant Events 09/21/22 1710: Start bilateral pulmonary artery infusion of alteplase, 1 mg/hr through each catheter x 12 hours for total TPA dose = 24 mg 8/14 1923: First heparin level = 0.64 - therapeutic 8 hours after 1500 unit bolus & drip at 1300 units/hr.  Per notes pt having some hematuria after foley removed 8/14 at 21:28 - tPA stopped due to hematuria 8/14 at 22:05 - Heparin infusion stopped due to hematuria 09/22/22 01:35 - Dr Warrick Parisian notified pharmacy and requested heparin infusion be resumed with NO BOLUS 09/22/22- CT negative for retroperitoneal bleed.  Today, 09/25/2022:   Heparin level 0.33, remains therapeutic on heparin 1300 units/hr CBC:  Hgb down to 8.0, Pltc WNL Per RN, bleeding is ongoing from his catheter and incision site.   Heparin infusion continues without interruptions- noted CCM plan: continue heparin for now but may need to hold and place IVC filter if bleeding doesn't resolve  Goal of Therapy:  Heparin level 0.3-0.7 units/ml Monitor platelets by anticoagulation protocol: Yes   Plan:  Continue heparin IV infusion at 1300 units/hr Monitor daily heparin level, CBC, signs/symptoms of bleeding   Thank you for allowing pharmacy to be a part of  this patient's care.  Junita Push, PharmD, BCPS 09/25/2022 5:47 AM

## 2022-09-25 NOTE — Consult Note (Signed)
Chief Complaint: Patient was seen in consultation today for  Chief Complaint  Patient presents with   Abdominal Pain   Dizziness   Headache   Referring Physician(s): Dr. Caleb Popp   Supervising Physician: Malachy Moan  Patient Status: [facility] - In-pt  History of Present Illness: [name] is an 82 y.o. male with a medical history significant for benign brain tumor (removed at age 86), basal cell skin cancer, melanoma (recent resection of left facial/upper back regions), fibromyalgia, umbilical and inguinal hernias, HTN, BPH and atrial fibrillation. He presented to the [facility] ED 09/21/22 following a pre-syncopal episode at home with associated dyspnea, abdominal discomfort, nausea, dizziness and mid-sternal chest discomfort.   Work up in the ED was positive for bilateral pulmonary emboli with right heart strain. A DVT was discovered in the right femoral vein. Interventional Radiology was consulted for PE thrombolysis and bilateral pulmonary artery infusion catheters were placed 09/21/22. He unfortunately developed gross hematuria while the tPA was infusing and the infusion was stopped after only 5 hours of treatment. He was transitioned to heparin and Urology was consulted for management of gross hematuria. A foley catheter was placed with CBI initiated for management.   The patient has continued to experience bleeding with significant blood loss anemia requiring cessation of IV heparin. Interventional Radiology has been asked to evaluate this patient for an image-guided inferior vena cava filter placement. Imaging reviewed and procedure approved by Dr. Archer Asa.  Past Medical History:  Diagnosis Date   Arthritis    HANDS AND PT STATES HIS ARMS HURT   Brain tumor [facility])    age 31 - brain tumor removed left- benign - occas slight headaches since the surgery   Colonic mass 2001   Fatigue    x 1 week o as of 02-17-2020   Fibromyalgia 1990's    Frequent PVCs    Generalized  body aches    x 1 week as of 02-17-2020   H/O inguinal hernia    right side   Hernia, umbilical    History of kidney stones    HX OF MULTIPLE KIDNEY STONES AND  CURRENTLY HAS BILATERAL RENAL STONES CAUSING ABDOMINAL AND BACK PAIN   Hypertension    high blood pressure readings    NSVT (nonsustained ventricular tachycardia) (HCC)    Premature atrial contractions    Prolonged Q-T interval on ECG    hx of none currently   Wears dentures    full dentures    Past Surgical History:  Procedure Laterality Date   BRAIN TUMOR EXCISION  age 18   CARDIAC CATHETERIZATION N/A 04/09/2015   Procedure: Left Heart Cath and Coronary Angiography;  Surgeon: Kathleene Hazel, MD;  Location: [facility] INVASIVE CV LAB;  Service: Cardiovascular;  Laterality: N/A;   COLON SURGERY  2001   COLON RESECTION FOR GROWTH - NOT CANCER   CYSTOSCOPY WITH RETROGRADE PYELOGRAM, URETEROSCOPY AND STENT PLACEMENT Left 10/05/2012   Procedure: CYSTOSCOPY WITH RETROGRADE PYELOGRAM, URETEROSCOPY AND STENT PLACEMENT;  Surgeon: Sebastian Ache, MD;  Location: [facility] ORS;  Service: Urology;  Laterality: Left;   CYSTOSCOPY/RETROGRADE/URETEROSCOPY/STONE EXTRACTION WITH BASKET Right 10/05/2012   Procedure: CYSTOSCOPY/RETROGRADE/URETEROSCOPY/STONE EXTRACTION WITH BASKET;  Surgeon: Sebastian Ache, MD;  Location: [facility] ORS;  Service: Urology;  Laterality: Right;   CYSTOSCOPY/RETROGRADE/URETEROSCOPY/STONE EXTRACTION WITH BASKET Bilateral 10/24/2012   Procedure: CYSTOSCOPY/RETROGRADE/URETEROSCOPY/STONE EXTRACTION WITH BASKET;  Surgeon: Sebastian Ache, MD;  Location: [facility] ORS;  Service: Urology;  Laterality: Bilateral;  with bilateral stent placement   HOLMIUM LASER APPLICATION Right 10/05/2012  Procedure: HOLMIUM LASER APPLICATION;  Surgeon: Sebastian Ache, MD;  Location: [facility] ORS;  Service: Urology;  Laterality: Right;   HOLMIUM LASER APPLICATION Bilateral 10/24/2012   Procedure: HOLMIUM LASER APPLICATION;  Surgeon: Sebastian Ache, MD;  Location: [facility] ORS;   Service: Urology;  Laterality: Bilateral;   IR ANGIOGRAM PULMONARY BILATERAL SELECTIVE  09/21/2022   IR INFUSION THROMBOL ARTERIAL INITIAL (MS)  09/21/2022   IR INFUSION THROMBOL ARTERIAL INITIAL (MS)  09/21/2022   IR THROMB F/U EVAL ART/VEN FINAL DAY (MS)  09/22/2022   IR US GUIDE VASC ACCESS RIGHT  09/21/2022   LITHOTRIPSY     TONSILLECTOMY  age 20's   and adenoids    Allergies: Other, Prozac [fluoxetine hcl], Sulfa antibiotics, Vicodin [hydrocodone-acetaminophen], and Erythromycin  Medications: Prior to Admission medications   Medication Sig Start Date End Date Taking? Authorizing Provider  aspirin EC 81 MG tablet Take 1 tablet (81 mg total) by mouth daily. 04/02/15  Yes Lars Masson, MD  buPROPion (WELLBUTRIN XL) 150 MG 24 hr tablet TAKE 1 TABLET BY MOUTH EVERY DAY 07/12/22  Yes Deeann Saint, MD  carvedilol (COREG) 3.125 MG tablet TAKE 1 TABLET BY MOUTH TWICE A DAY 09/15/22  Yes Nafziger, Kandee Keen, NP  ibuprofen (ADVIL) 600 MG tablet Take 600 mg by mouth every 8 (eight) hours.   Yes [provider]  lisinopril (ZESTRIL) 10 MG tablet TAKE 1 TABLET BY MOUTH EVERY DAY 08/31/22  Yes Nafziger, Kandee Keen, NP  meclizine (ANTIVERT) 25 MG tablet Take 1 tablet (25 mg total) by mouth 3 (three) times daily as needed for dizziness. 10/21/21  Yes Nafziger, Kandee Keen, NP  Misc Natural Products (OSTEO BI-FLEX ADV JOINT SHIELD PO) Take 2 capsules by mouth daily.   Yes [provider]  Multiple Vitamin (MULTIVITAMIN) tablet Take 1 tablet by mouth daily.   Yes [provider]  ondansetron (ZOFRAN) 4 MG tablet Take 1 tablet (4 mg total) by mouth every 8 (eight) hours as needed for nausea or vomiting. 10/21/21  Yes Nafziger, Kandee Keen, NP  tamsulosin (FLOMAX) 0.4 MG CAPS capsule Take 1 capsule (0.4 mg total) by mouth daily. 01/14/22  Yes Nafziger, Kandee Keen, NP  atorvastatin (LIPITOR) 10 MG tablet Take 1 tablet (10 mg total) by mouth daily. Patient not taking: Reported on 09/21/2022 10/22/21   Shirline Frees, NP  Cranberry 1000 MG CAPS Take 1,000 mg by mouth daily.  Patient not taking: Reported on 09/21/2022    [provider]  ondansetron (ZOFRAN-ODT) 4 MG disintegrating tablet Take 1 tablet (4 mg total) by mouth every 8 (eight) hours as needed for nausea or vomiting. Patient not taking: Reported on 09/21/2022 12/14/21   Shirline Frees, NP     Family History  Problem Relation Age of Onset   Colon cancer Father    Heart disease Father        quad bypass    Social History   Socioeconomic History   Marital status: Married    Spouse name: Not on file   Number of children: Not on file   Years of education: Not on file   Highest education level: Not on file  Occupational History   Not on file  Tobacco Use   Smoking status: Never   Smokeless tobacco: Never  Vaping Use   Vaping status: Never Used  Substance and Sexual Activity   Alcohol use: Yes    Alcohol/week: 7.0 standard drinks of alcohol    Types: 7 Standard drinks or equivalent per week    Comment:  1 drink per day   Drug use: Not Currently   Sexual activity: Not on file  Other Topics Concern   Not on file  Social History Narrative   Retired from Engineer, manufacturing    Has one son - lives local    Likes to play Tennis   Social Determinants of Health   Financial Resource Strain: Low Risk  (05/06/2021)   Overall Financial Resource Strain (CARDIA)    Difficulty of Paying Living Expenses: Not hard at all  Food Insecurity: No Food Insecurity (09/22/2022)   Hunger Vital Sign    Worried About Running Out of Food in the Last Year: Never true    Ran Out of Food in the Last Year: Never true  Transportation Needs: No Transportation Needs (09/22/2022)   PRAPARE - Administrator, Civil Service (Medical): No    Lack of Transportation (Non-Medical): No  Physical Activity: Insufficiently Active (05/06/2021)   Exercise Vital Sign    Days of Exercise per Week: 1 day    Minutes of Exercise per Session: 90 min  Stress: No  Stress Concern Present (05/06/2021)   [employer] of Occupational Health - Occupational Stress Questionnaire    Feeling of Stress : Not at all  Social Connections: Moderately Isolated (05/06/2021)   Social Connection and Isolation Panel [NHANES]    Frequency of Communication with Friends and Family: More than three times a week    Frequency of Social Gatherings with Friends and Family: More than three times a week    Attends Religious Services: Never    Database administrator or Organizations: Yes    Attends Engineer, structural: More than 4 times per year    Marital Status: Never married    Review of Systems: A 12 point ROS discussed and pertinent positives are indicated in the HPI above.  All other systems are negative.  Review of Systems  Constitutional:  Positive for fatigue.  Cardiovascular:  Negative for chest pain and leg swelling.  Gastrointestinal:  Positive for vomiting. Negative for abdominal pain.  Genitourinary:  Positive for hematuria and penile pain.  Musculoskeletal:  Positive for back pain.  Skin:  Positive for wound.  Neurological:  Negative for dizziness and headaches.  Hematological:  Bruises/bleeds easily.    Vital Signs: BP 137/82   Pulse (!) 50   Temp 98.2 F (36.8 C) (Oral)   Resp 15   Ht 6' (1.829 m)   Wt 179 lb 3.7 oz (81.3 kg)   SpO2 96%   BMI 24.31 kg/m   Physical Exam Constitutional:      General: He is not in acute distress.    Appearance: He is ill-appearing.  HENT:     Mouth/Throat:     Mouth: Mucous membranes are moist.     Pharynx: Oropharynx is clear.  Cardiovascular:     Rate and Rhythm: Normal rate. Rhythm irregular.     Pulses: Normal pulses.     Heart sounds: Normal heart sounds.     Comments: Atrial fibrillation. Right internal jugular site from lysis catheters is oozing blood. Pulmonary:     Effort: Pulmonary effort is normal.     Breath sounds: Normal breath sounds.  Abdominal:     Palpations: Abdomen is  soft.     Tenderness: There is no abdominal tenderness.  Genitourinary:    Comments: Foley catheter with CBI. Urine in foley bag is lightly blood-tinged.  Skin:    General: Skin is warm and dry.  Findings: Bruising and wound present.     Comments: Left face/forehead wrapped in gauze due to bleeding from recent melanoma excision procedure.   Neurological:     Mental Status: He is alert and oriented to person, place, and time.  Psychiatric:        Mood and Affect: Mood normal.        Behavior: Behavior normal.        Thought Content: Thought content normal.        Judgment: Judgment normal.     Imaging: IR Angiogram Pulmonary Bilateral Selective  Result Date: 09/23/2022 INDICATION: 82 year old with submassive pulmonary embolism. Right heart strain. Plan for catheter directed thrombolysis of bilateral pulmonary emboli. EXAM: 1. Placement of bilateral pulmonary artery infusion catheters 2. Bilateral pulmonary angiography 3. Pulmonary artery pressure measurement 4. Ultrasound guidance for vascular access x 2 COMPARISON:  Chest CTA 09/21/2022 MEDICATIONS: Moderate sedation ANESTHESIA/SEDATION: Moderate (conscious) sedation was employed during this procedure. A total of Versed 0.5 mg and Fentanyl 25 mcg was administered intravenously by the radiology nurse. Total intra-service moderate Sedation Time: 54 minutes. The patient's level of consciousness and vital signs were monitored continuously by radiology nursing throughout the procedure under my direct supervision. FLUOROSCOPY: Radiation Exposure Index (as provided by the fluoroscopic device): 63 mGy Kerma CONTRAST:  10 mL Omnipaque 300 COMPLICATIONS: None immediate. TECHNIQUE: Informed written consent was obtained from the patient after a thorough discussion of the procedural risks, benefits and alternatives. All questions were addressed. A timeout was performed prior to the initiation of the procedure. Patient was placed supine. Ultrasound  confirmed a patent right internal jugular vein. Ultrasound image was saved for fracture documentation. Right neck was prepped and draped in sterile fashion. Maximal barrier sterile technique was utilized including caps, mask, sterile gowns, sterile gloves, sterile drape, hand hygiene and skin antiseptic. Skin was anesthetized in the right neck with 1% lidocaine. Using ultrasound guidance, 21 gauge needle was directed into the right internal jugular vein and micropuncture dilator set was placed. Second micropuncture dilator set was placed into the right internal jugular vein using ultrasound guidance. Both micropuncture dilator sets were upsized to a 6 [place] vascular sheath. A C2 catheter was advanced through 1 of the sheath and directed into the right side of the heart and main pulmonary artery. Pulmonary artery pressure was obtained. Catheter was advanced into the left pulmonary artery. Catheter was removed over a Rosen wire. C2 catheter was advanced through the other sheath into the right heart and right pulmonary artery. Catheter was advanced into the right interlobar artery region and contrast injection confirmed adequate placement within a right pulmonary artery. A 90 cm, 10 cm infusion length, UniFuse catheter was advanced over the Rosen wire and positioned in the right pulmonary artery. A second 90 cm, 10 cm infusion length, UniFuse catheter was advanced over the left pulmonary artery wire into the left pulmonary artery. Contrast injection confirmed adequate placement in left pulmonary artery. Occluding wires were placed in both infusion catheters. Both catheters were flushed with saline and tPA was started through both catheters. Both sheaths were sutured to skin. Bandage was placed over the right neck. FINDINGS: Main pulmonary artery pressure was 58/13 mmHg, mean 31. Right pulmonary artery infusion catheter tip terminated in a right lower lobe branch. Left pulmonary artery catheter tip extends into the  left pulmonary artery. IMPRESSION: 1. Successful placement of bilateral pulmonary artery catheters for catheter-directed thrombolysis. 2. Elevated pulmonary artery pressure measuring 58/13 mm Hg, mean 31. Electronically Signed  By: Richarda Overlie M.D.   On: 09/23/2022 11:41   CT ABDOMEN PELVIS WO CONTRAST  Result Date: 09/22/2022 CLINICAL DATA:  Abdominal distension, rule out retroperitoneal bleed post pulmonary artery thrombolysis EXAM: CT ABDOMEN AND PELVIS WITHOUT CONTRAST TECHNIQUE: Multidetector CT imaging of the abdomen and pelvis was performed following the standard protocol without IV contrast. RADIATION DOSE REDUCTION: This exam was performed according to the departmental dose-optimization program which includes automated exposure control, adjustment of the mA and/or kV according to patient size and/or use of iterative reconstruction technique. COMPARISON:  the previous day's study FINDINGS: Lower chest: New subsegmental airspace consolidation in the dependent aspect of both lower lobes, left worse than right. Hepatobiliary: No focal liver abnormality is seen. No gallstones, gallbladder wall thickening, or biliary dilatation. Pancreas: Unremarkable. No pancreatic ductal dilatation or surrounding inflammatory changes. Spleen: Normal in size without focal abnormality. Adrenals/Urinary Tract: Adrenal glands are unremarkable. Kidneys are normal, without renal calculi, focal lesion, or hydronephrosis. Residual contrast in the collecting systems from previous administration. Urinary bladder decompressed by Foley catheter. Stomach/Bowel: Stomach decompressed. Small bowel is nondilated. A loop of small bowel protrudes into right inguinal hernia without obstruction or strangulation. Normal appendix. Colon is incompletely distended, with a few scattered distal descending diverticula; no adjacent inflammatory change. Vascular/Lymphatic: Mild scattered aortoiliac calcified plaque without aneurysm. Reproductive:  Prostate enlargement Other: No retroperitoneal hematoma.  No ascites.  No free air. Musculoskeletal: Ventral and periumbilical hernias. Right inguinal hernia containing a loop of small bowel, without obstruction or strangulation. IMPRESSION: 1. No retroperitoneal hematoma. 2. Subsegmental bibasilar airspace consolidation, left worse than right. 3. Right inguinal hernia containing a loop of small bowel, without obstruction or strangulation. 4.  Aortic Atherosclerosis (ICD10-I70.0). Electronically Signed   By: Corlis Leak M.D.   On: 09/22/2022 17:08   IR US Guide Vasc Access Right  Result Date: 09/22/2022 INDICATION: 82 year old with submassive pulmonary embolism. Right heart strain. Plan for catheter directed thrombolysis of bilateral pulmonary emboli. EXAM: 1. Placement of bilateral pulmonary artery infusion catheters 2. Bilateral pulmonary angiography 3. Pulmonary artery pressure measurement 4. Ultrasound guidance for vascular access x 2 COMPARISON:  Chest CTA 09/21/2022 MEDICATIONS: Moderate sedation ANESTHESIA/SEDATION: Moderate (conscious) sedation was employed during this procedure. A total of Versed 0.5 mg and Fentanyl 25 mcg was administered intravenously by the radiology nurse. Total intra-service moderate Sedation Time: 54 minutes. The patient's level of consciousness and vital signs were monitored continuously by radiology nursing throughout the procedure under my direct supervision. FLUOROSCOPY: Radiation Exposure Index (as provided by the fluoroscopic device): 63 mGy Kerma CONTRAST:  10 mL Omnipaque 300 COMPLICATIONS: None immediate. TECHNIQUE: Informed written consent was obtained from the patient after a thorough discussion of the procedural risks, benefits and alternatives. All questions were addressed. A timeout was performed prior to the initiation of the procedure. Patient was placed supine. Ultrasound confirmed a patent right internal jugular vein. Ultrasound image was saved for fracture  documentation. Right neck was prepped and draped in sterile fashion. Maximal barrier sterile technique was utilized including caps, mask, sterile gowns, sterile gloves, sterile drape, hand hygiene and skin antiseptic. Skin was anesthetized in the right neck with 1% lidocaine. Using ultrasound guidance, 21 gauge needle was directed into the right internal jugular vein and micropuncture dilator set was placed. Second micropuncture dilator set was placed into the right internal jugular vein using ultrasound guidance. Both micropuncture dilator sets were upsized to a 6 [place] vascular sheath. A C2 catheter was advanced through 1 of the sheath and directed  into the right side of the heart and main pulmonary artery. Pulmonary artery pressure was obtained. Catheter was advanced into the left pulmonary artery. Catheter was removed over a Rosen wire. C2 catheter was advanced through the other sheath into the right heart and right pulmonary artery. Catheter was advanced into the right interlobar artery region and contrast injection confirmed adequate placement within a right pulmonary artery. A 90 cm, 10 cm infusion length, UniFuse catheter was advanced over the Rosen wire and positioned in the right pulmonary artery. A second 90 cm, 10 cm infusion length, UniFuse catheter was advanced over the left pulmonary artery wire into the left pulmonary artery. Contrast injection confirmed adequate placement in left pulmonary artery. Occluding wires were placed in both infusion catheters. Both catheters were flushed with saline and tPA was started through both catheters. Both sheaths were sutured to skin. Bandage was placed over the right neck. FINDINGS: Main pulmonary artery pressure was 58/13 mmHg, mean 31. Right pulmonary artery infusion catheter tip terminated in a right lower lobe branch. Left pulmonary artery catheter tip extends into the left pulmonary artery. IMPRESSION: 1. Successful placement of bilateral pulmonary artery  catheters for catheter-directed thrombolysis. 2. Elevated pulmonary artery pressure measuring 58/13 mm Hg, mean 31. Electronically Signed   By: Richarda Overlie M.D.   On: 09/22/2022 13:09   IR INFUSION THROMBOL ARTERIAL INITIAL (MS)  Result Date: 09/22/2022 INDICATION: 82 year old with submassive pulmonary embolism. Right heart strain. Plan for catheter directed thrombolysis of bilateral pulmonary emboli. EXAM: 1. Placement of bilateral pulmonary artery infusion catheters 2. Bilateral pulmonary angiography 3. Pulmonary artery pressure measurement 4. Ultrasound guidance for vascular access x 2 COMPARISON:  Chest CTA 09/21/2022 MEDICATIONS: Moderate sedation ANESTHESIA/SEDATION: Moderate (conscious) sedation was employed during this procedure. A total of Versed 0.5 mg and Fentanyl 25 mcg was administered intravenously by the radiology nurse. Total intra-service moderate Sedation Time: 54 minutes. The patient's level of consciousness and vital signs were monitored continuously by radiology nursing throughout the procedure under my direct supervision. FLUOROSCOPY: Radiation Exposure Index (as provided by the fluoroscopic device): 63 mGy Kerma CONTRAST:  10 mL Omnipaque 300 COMPLICATIONS: None immediate. TECHNIQUE: Informed written consent was obtained from the patient after a thorough discussion of the procedural risks, benefits and alternatives. All questions were addressed. A timeout was performed prior to the initiation of the procedure. Patient was placed supine. Ultrasound confirmed a patent right internal jugular vein. Ultrasound image was saved for fracture documentation. Right neck was prepped and draped in sterile fashion. Maximal barrier sterile technique was utilized including caps, mask, sterile gowns, sterile gloves, sterile drape, hand hygiene and skin antiseptic. Skin was anesthetized in the right neck with 1% lidocaine. Using ultrasound guidance, 21 gauge needle was directed into the right internal  jugular vein and micropuncture dilator set was placed. Second micropuncture dilator set was placed into the right internal jugular vein using ultrasound guidance. Both micropuncture dilator sets were upsized to a 6 [place] vascular sheath. A C2 catheter was advanced through 1 of the sheath and directed into the right side of the heart and main pulmonary artery. Pulmonary artery pressure was obtained. Catheter was advanced into the left pulmonary artery. Catheter was removed over a Rosen wire. C2 catheter was advanced through the other sheath into the right heart and right pulmonary artery. Catheter was advanced into the right interlobar artery region and contrast injection confirmed adequate placement within a right pulmonary artery. A 90 cm, 10 cm infusion length, UniFuse catheter was advanced over the  Rosen wire and positioned in the right pulmonary artery. A second 90 cm, 10 cm infusion length, UniFuse catheter was advanced over the left pulmonary artery wire into the left pulmonary artery. Contrast injection confirmed adequate placement in left pulmonary artery. Occluding wires were placed in both infusion catheters. Both catheters were flushed with saline and tPA was started through both catheters. Both sheaths were sutured to skin. Bandage was placed over the right neck. FINDINGS: Main pulmonary artery pressure was 58/13 mmHg, mean 31. Right pulmonary artery infusion catheter tip terminated in a right lower lobe branch. Left pulmonary artery catheter tip extends into the left pulmonary artery. IMPRESSION: 1. Successful placement of bilateral pulmonary artery catheters for catheter-directed thrombolysis. 2. Elevated pulmonary artery pressure measuring 58/13 mm Hg, mean 31. Electronically Signed   By: Richarda Overlie M.D.   On: 09/22/2022 13:09   IR INFUSION THROMBOL ARTERIAL INITIAL (MS)  Result Date: 09/22/2022 INDICATION: 81 year old with submassive pulmonary embolism. Right heart strain. Plan for catheter  directed thrombolysis of bilateral pulmonary emboli. EXAM: 1. Placement of bilateral pulmonary artery infusion catheters 2. Bilateral pulmonary angiography 3. Pulmonary artery pressure measurement 4. Ultrasound guidance for vascular access x 2 COMPARISON:  Chest CTA 09/21/2022 MEDICATIONS: Moderate sedation ANESTHESIA/SEDATION: Moderate (conscious) sedation was employed during this procedure. A total of Versed 0.5 mg and Fentanyl 25 mcg was administered intravenously by the radiology nurse. Total intra-service moderate Sedation Time: 54 minutes. The patient's level of consciousness and vital signs were monitored continuously by radiology nursing throughout the procedure under my direct supervision. FLUOROSCOPY: Radiation Exposure Index (as provided by the fluoroscopic device): 63 mGy Kerma CONTRAST:  10 mL Omnipaque 300 COMPLICATIONS: None immediate. TECHNIQUE: Informed written consent was obtained from the patient after a thorough discussion of the procedural risks, benefits and alternatives. All questions were addressed. A timeout was performed prior to the initiation of the procedure. Patient was placed supine. Ultrasound confirmed a patent right internal jugular vein. Ultrasound image was saved for fracture documentation. Right neck was prepped and draped in sterile fashion. Maximal barrier sterile technique was utilized including caps, mask, sterile gowns, sterile gloves, sterile drape, hand hygiene and skin antiseptic. Skin was anesthetized in the right neck with 1% lidocaine. Using ultrasound guidance, 21 gauge needle was directed into the right internal jugular vein and micropuncture dilator set was placed. Second micropuncture dilator set was placed into the right internal jugular vein using ultrasound guidance. Both micropuncture dilator sets were upsized to a 6 [place] vascular sheath. A C2 catheter was advanced through 1 of the sheath and directed into the right side of the heart and main pulmonary  artery. Pulmonary artery pressure was obtained. Catheter was advanced into the left pulmonary artery. Catheter was removed over a Rosen wire. C2 catheter was advanced through the other sheath into the right heart and right pulmonary artery. Catheter was advanced into the right interlobar artery region and contrast injection confirmed adequate placement within a right pulmonary artery. A 90 cm, 10 cm infusion length, UniFuse catheter was advanced over the Rosen wire and positioned in the right pulmonary artery. A second 90 cm, 10 cm infusion length, UniFuse catheter was advanced over the left pulmonary artery wire into the left pulmonary artery. Contrast injection confirmed adequate placement in left pulmonary artery. Occluding wires were placed in both infusion catheters. Both catheters were flushed with saline and tPA was started through both catheters. Both sheaths were sutured to skin. Bandage was placed over the right neck. FINDINGS: Main pulmonary artery pressure  was 58/13 mmHg, mean 31. Right pulmonary artery infusion catheter tip terminated in a right lower lobe branch. Left pulmonary artery catheter tip extends into the left pulmonary artery. IMPRESSION: 1. Successful placement of bilateral pulmonary artery catheters for catheter-directed thrombolysis. 2. Elevated pulmonary artery pressure measuring 58/13 mm Hg, mean 31. Electronically Signed   By: Richarda Overlie M.D.   On: 09/22/2022 13:09   IR THROMB F/U EVAL ART/VEN FINAL DAY (MS)  Result Date: 09/22/2022 Assunta Found, RT     09/22/2022  9:55 AM At the patients bedside, bilateral pulmonary artery infusion catheters were accessed and pulled back about 10 cm for a pressure reading. Pulmonary pressure read 52/13 with a mean of 27. Bilateral pulmonary artery infusion catheters were removed following the removal of bilateral 6 Fr sheaths. Patients floor nurse held pressure and verbalized that an occlusive dressing would be placed.   VAS [place] LOWER  EXTREMITY VENOUS (DVT)  Result Date: 09/22/2022  Lower Venous DVT Study Patient Name:  [name]  Date of Exam:   09/21/2022 Medical Rec #: [id number]          Accession #:    [id number] Date of Birth: 1940-08-07           Patient Gender: M Patient Age:   46 years Exam Location:  [facility] Procedure:      VAS [place] LOWER EXTREMITY VENOUS (DVT) Referring Phys: Zenia Resides --------------------------------------------------------------------------------  Indications: Pulmonary embolism.  Risk Factors: Confirmed PE. Anticoagulation: Heparin. Comparison Study: No prior studies. Performing Technologist: Chanda Busing RVT  Examination Guidelines: A complete evaluation includes B-mode imaging, spectral Doppler, color Doppler, and power Doppler as needed of all accessible portions of each vessel. Bilateral testing is considered an integral part of a complete examination. Limited examinations for reoccurring indications may be performed as noted. The reflux portion of the exam is performed with the patient in reverse Trendelenburg.  +---------+---------------+---------+-----------+----------+--------------+ RIGHT    CompressibilityPhasicitySpontaneityPropertiesThrombus Aging +---------+---------------+---------+-----------+----------+--------------+ CFV      Full           Yes      Yes                                 +---------+---------------+---------+-----------+----------+--------------+ SFJ      Full                                                        +---------+---------------+---------+-----------+----------+--------------+ FV Prox  Partial        No       No                   Acute          +---------+---------------+---------+-----------+----------+--------------+ FV Mid   Full                                                        +---------+---------------+---------+-----------+----------+--------------+ FV DistalFull                                                         +---------+---------------+---------+-----------+----------+--------------+  PFV      Full                                                        +---------+---------------+---------+-----------+----------+--------------+ POP      Full           Yes      Yes                                 +---------+---------------+---------+-----------+----------+--------------+ PTV      Full                                                        +---------+---------------+---------+-----------+----------+--------------+ PERO     Full                                                        +---------+---------------+---------+-----------+----------+--------------+   +---------+---------------+---------+-----------+----------+--------------+ LEFT     CompressibilityPhasicitySpontaneityPropertiesThrombus Aging +---------+---------------+---------+-----------+----------+--------------+ CFV      Full           Yes      No                                  +---------+---------------+---------+-----------+----------+--------------+ SFJ      Full                                                        +---------+---------------+---------+-----------+----------+--------------+ FV Prox  Full                                                        +---------+---------------+---------+-----------+----------+--------------+ FV Mid   Full                                                        +---------+---------------+---------+-----------+----------+--------------+ FV DistalFull                                                        +---------+---------------+---------+-----------+----------+--------------+ PFV      Full                                                        +---------+---------------+---------+-----------+----------+--------------+  POP      Full           Yes      Yes                                  +---------+---------------+---------+-----------+----------+--------------+ PTV      Full                                                        +---------+---------------+---------+-----------+----------+--------------+ PERO     Full                                                        +---------+---------------+---------+-----------+----------+--------------+     Summary: RIGHT: - Findings consistent with acute deep vein thrombosis involving the right femoral vein. - No cystic structure found in the popliteal fossa.  LEFT: - There is no evidence of deep vein thrombosis in the lower extremity.  - No cystic structure found in the popliteal fossa.  *See table(s) above for measurements and observations. Electronically signed by Sherald Hess MD on 09/22/2022 at 9:46:41 AM.    Final    CT HEAD W & WO CONTRAST ( )  Result Date: 09/21/2022 CLINICAL DATA:  Melanoma.  Pulmonary embolus. EXAM: CT HEAD WITHOUT AND WITH CONTRAST TECHNIQUE: Contiguous axial images were obtained from the base of the skull through the vertex without and with intravenous contrast. RADIATION DOSE REDUCTION: This exam was performed according to the departmental dose-optimization program which includes automated exposure control, adjustment of the mA and/or kV according to patient size and/or use of iterative reconstruction technique. CONTRAST:  70mL OMNIPAQUE IOHEXOL 300 MG/ML  SOLN COMPARISON:  CT head without contrast and MR head without contrast 08/10/2019 FINDINGS: Brain: No acute infarct, hemorrhage, or mass lesion is present. Scattered subcortical white matter hypoattenuation is present bilaterally. A Deep brain nuclei are within normal limits. The ventricles are of normal size. No significant extraaxial fluid collection is present. Vascular: Minimal calcifications are present within the cavernous internal carotid arteries. Contrast is present within the blood vessels. No asymmetric hyperdensity is present. Skull:  Calvarium is intact. No focal lytic or blastic lesions are present. No significant extracranial soft tissue lesion is present. Sinuses/Orbits: The paranasal sinuses and mastoid air cells are clear. The globes and orbits are within normal limits. IMPRESSION: 1. No acute or focal lesion to suggest metastatic disease. 2. No acute hemorrhage. 3. Scattered subcortical white matter hypoattenuation bilaterally. 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 Electronically Signed   By: Marin Roberts M.D.   On: 09/21/2022 14:42   ECHOCARDIOGRAM COMPLETE  Result Date: 09/21/2022    ECHOCARDIOGRAM REPORT   Patient Name:   [name] Date of Exam: 09/21/2022 Medical Rec #:  [id number]         Height:       72.0 in Accession #:    [id number]        Weight:       168.0 lb Date of Birth:  12/23/1940          BSA:  1.978 m Patient Age:    82 years          BP:           129/94 mmHg Patient Gender: M                 HR:           82 bpm. Exam Location:  Inpatient Procedure: 2D Echo, Cardiac Doppler and Color Doppler Indications:    Pulmonary Embolus I26.09  History:        Patient has prior history of Echocardiogram examinations, most                 recent 04/02/2015. Risk Factors:Hypertension.  Sonographer:    Harriette Bouillon RDCS Referring Phys: [id number] ADEWALE A OLALERE IMPRESSIONS  1. Left ventricular ejection fraction, by estimation, is 50 to 55%. The left ventricle has low normal function. The left ventricle has no regional wall motion abnormalities. There is mild left ventricular hypertrophy. Left ventricular diastolic parameters are consistent with Grade I diastolic dysfunction (impaired relaxation).  2. Right ventricular systolic function is severely reduced. The right ventricular size is moderately enlarged. There is severely elevated pulmonary artery systolic pressure.  3. Right atrial size was severely dilated.  4. The mitral valve is normal in structure. Mild mitral valve  regurgitation. No evidence of mitral stenosis.  5. The aortic valve is tricuspid. Aortic valve regurgitation is not visualized. No aortic stenosis is present.  6. The inferior vena cava is dilated in size with <50% respiratory variability, suggesting right atrial pressure of 15 mmHg. Comparison(s): No prior Echocardiogram. FINDINGS  Left Ventricle: Left ventricular ejection fraction, by estimation, is 50 to 55%. The left ventricle has low normal function. The left ventricle has no regional wall motion abnormalities. The left ventricular internal cavity size was normal in size. There is mild left ventricular hypertrophy. Left ventricular diastolic parameters are consistent with Grade I diastolic dysfunction (impaired relaxation). Right Ventricle: The right ventricular size is moderately enlarged. Right ventricular systolic function is severely reduced. There is severely elevated pulmonary artery systolic pressure. The tricuspid regurgitant velocity is 3.37 m/s, and with an assumed right atrial pressure of 15 mmHg, the estimated right ventricular systolic pressure is 60.4 mmHg. Left Atrium: Left atrial size was normal in size. Right Atrium: Right atrial size was severely dilated. Pericardium: There is no evidence of pericardial effusion. Mitral Valve: The mitral valve is normal in structure. Mild mitral valve regurgitation. No evidence of mitral valve stenosis. Tricuspid Valve: The tricuspid valve is normal in structure. Tricuspid valve regurgitation is mild . No evidence of tricuspid stenosis. Aortic Valve: The aortic valve is tricuspid. Aortic valve regurgitation is not visualized. No aortic stenosis is present. Pulmonic Valve: The pulmonic valve was normal in structure. Pulmonic valve regurgitation is mild. No evidence of pulmonic stenosis. Aorta: The aortic root is normal in size and structure. Venous: The inferior vena cava is dilated in size with less than 50% respiratory variability, suggesting right atrial  pressure of 15 mmHg. IAS/Shunts: No atrial level shunt detected by color flow Doppler.  LEFT VENTRICLE PLAX 2D LVIDd:         4.80 cm   Diastology LVIDs:         4.00 cm   LV e' medial:  3.92 cm/s LV PW:         1.20 cm   LV e' lateral: 8.59 cm/s LV IVS:        1.20 cm LVOT diam:  2.20 cm LV SV:         56 LV SV Index:   28 LVOT Area:     3.80 cm  RIGHT VENTRICLE            IVC RV S prime:     6.42 cm/s  IVC diam: 2.90 cm TAPSE (M-mode): 1.4 cm LEFT ATRIUM           Index        RIGHT ATRIUM           Index LA diam:      3.60 cm 1.82 cm/m   RA Area:     30.90 cm LA Vol (A2C): 56.5 ml 28.56 ml/m  RA Volume:   126.00 ml 63.69 ml/m LA Vol (A4C): 31.3 ml 15.82 ml/m  AORTIC VALVE             PULMONIC VALVE LVOT Vmax:   82.30 cm/s  PR End Diast Vel: 1.43 msec LVOT Vmean:  54.800 cm/s LVOT VTI:    0.147 m  AORTA Ao Root diam: 3.70 cm Ao Asc diam:  3.80 cm TRICUSPID VALVE TR Peak grad:   45.4 mmHg TR Vmax:        337.00 cm/s  SHUNTS Systemic VTI:  0.15 m Systemic Diam: 2.20 cm Olga Millers MD Electronically signed by Olga Millers MD Signature Date/Time: 09/21/2022/12:33:12 PM    Final    CT ABDOMEN PELVIS W CONTRAST  Result Date: 09/21/2022 CLINICAL DATA:  Abdominal pain, dizziness, and headache. Recent diagnosis COVID with new O2 requirement EXAM: CT ANGIOGRAPHY CHEST CT ABDOMEN AND PELVIS WITH CONTRAST TECHNIQUE: Multidetector CT imaging of the chest was performed using the standard protocol during bolus administration of intravenous contrast. Multiplanar CT image reconstructions and MIPs were obtained to evaluate the vascular anatomy. Multidetector CT imaging of the abdomen and pelvis was performed using the standard protocol during bolus administration of intravenous contrast. RADIATION DOSE REDUCTION: This exam was performed according to the departmental dose-optimization program which includes automated exposure control, adjustment of the mA and/or kV according to patient size and/or use of iterative  reconstruction technique. CONTRAST:  OMNIPAQUE IOHEXOL 350 MG/ML SOLN COMPARISON:  Chest radiograph dated 09/21/2022, CT abdomen and pelvis dated 09/14/2019 FINDINGS: CTA CHEST FINDINGS Cardiovascular: The study is high quality for the evaluation of pulmonary embolism. Filling defects within the distal main pulmonary arteries bilaterally extending into all lobes. Main pulmonary artery measures 3.5 cm. Right ventricle to left ventricle ratio > 1. No significant pericardial fluid/thickening. Mediastinum/Nodes: Imaged thyroid gland without nodules meeting criteria for imaging follow-up by size. Normal esophagus. No pathologically enlarged axillary, supraclavicular, mediastinal, or hilar lymph nodes. Lungs/Pleura: The central airways are patent. No focal consolidation. No pneumothorax. No pleural effusion. Musculoskeletal: No acute or abnormal lytic or blastic osseous lesions. Multilevel degenerative changes of the thoracic spine. Review of the MIP images confirms the above findings. CT ABDOMEN and PELVIS FINDINGS Hepatobiliary: No focal hepatic lesions. No intra or extrahepatic biliary ductal dilation. Normal gallbladder. Pancreas: No focal lesions or main ductal dilation. Unchanged focus of coarse calcification in the pancreatic tail Spleen: Normal in size without focal abnormality. Adrenals/Urinary Tract: No adrenal nodules. No suspicious renal mass or hydronephrosis. Two distal left ureteral stones measuring up to 6 mm. Multiple additional bilateral nonobstructing stones. No focal bladder wall thickening. Stomach/Bowel: Normal appearance of the stomach. Small diverticulum arising from the second portion. No evidence of bowel wall thickening, distention, or inflammatory changes. Normal appendix. Vascular/Lymphatic: Aortic atherosclerosis. No enlarged  abdominal or pelvic lymph nodes. 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 Other: No free fluid, fluid collection, or free air.  Musculoskeletal: No acute or abnormal lytic or blastic osseous lesions. Multilevel degenerative changes of the lumbar spine. Unchanged small right inguinal hernia contains a loop of nonobstructed small bowel. Unchanged multiple moderate midline anterior abdominal fat containing hernias. IMPRESSION: 1. Bilateral pulmonary emboli within the distal main pulmonary arteries extending into all lobes with CT findings of right heart strain. 2. Two distal left ureteral stones measuring up to 6 mm. No hydronephrosis. Multiple additional bilateral nonobstructing renal stones. 3. Unchanged small right inguinal hernia contains a loop of nonobstructed small bowel and multiple moderate midline anterior abdominal fat containing hernias. 4. 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 5. Aortic Atherosclerosis (ICD10-I70.0). Critical Value/emergent results were called by telephone at the time of interpretation on 09/21/2022 at 10:55 am to provider Pricilla Loveless , who verbally acknowledged these results. Electronically Signed   By: Agustin Cree M.D.   On: 09/21/2022 11:00   CT Angio Chest PE W and/or Wo Contrast  Result Date: 09/21/2022 CLINICAL DATA:  Abdominal pain, dizziness, and headache. Recent diagnosis COVID with new O2 requirement EXAM: CT ANGIOGRAPHY CHEST CT ABDOMEN AND PELVIS WITH CONTRAST TECHNIQUE: Multidetector CT imaging of the chest was performed using the standard protocol during bolus administration of intravenous contrast. Multiplanar CT image reconstructions and MIPs were obtained to evaluate the vascular anatomy. Multidetector CT imaging of the abdomen and pelvis was performed using the standard protocol during bolus administration of intravenous contrast. RADIATION DOSE REDUCTION: This exam was performed according to the departmental dose-optimization program which includes automated exposure control, adjustment of the mA and/or kV according to patient size and/or use of iterative reconstruction  technique. CONTRAST:  OMNIPAQUE IOHEXOL 350 MG/ML SOLN COMPARISON:  Chest radiograph dated 09/21/2022, CT abdomen and pelvis dated 09/14/2019 FINDINGS: CTA CHEST FINDINGS Cardiovascular: The study is high quality for the evaluation of pulmonary embolism. Filling defects within the distal main pulmonary arteries bilaterally extending into all lobes. Main pulmonary artery measures 3.5 cm. Right ventricle to left ventricle ratio > 1. No significant pericardial fluid/thickening. Mediastinum/Nodes: Imaged thyroid gland without nodules meeting criteria for imaging follow-up by size. Normal esophagus. No pathologically enlarged axillary, supraclavicular, mediastinal, or hilar lymph nodes. Lungs/Pleura: The central airways are patent. No focal consolidation. No pneumothorax. No pleural effusion. Musculoskeletal: No acute or abnormal lytic or blastic osseous lesions. Multilevel degenerative changes of the thoracic spine. Review of the MIP images confirms the above findings. CT ABDOMEN and PELVIS FINDINGS Hepatobiliary: No focal hepatic lesions. No intra or extrahepatic biliary ductal dilation. Normal gallbladder. Pancreas: No focal lesions or main ductal dilation. Unchanged focus of coarse calcification in the pancreatic tail Spleen: Normal in size without focal abnormality. Adrenals/Urinary Tract: No adrenal nodules. No suspicious renal mass or hydronephrosis. Two distal left ureteral stones measuring up to 6 mm. Multiple additional bilateral nonobstructing stones. No focal bladder wall thickening. Stomach/Bowel: Normal appearance of the stomach. Small diverticulum arising from the second portion. No evidence of bowel wall thickening, distention, or inflammatory changes. Normal appendix. Vascular/Lymphatic: Aortic atherosclerosis. No enlarged abdominal or pelvic lymph nodes. 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 Other: No free fluid, fluid collection, or free air. Musculoskeletal: No  acute or abnormal lytic or blastic osseous lesions. Multilevel degenerative changes of the lumbar spine. Unchanged small right inguinal hernia contains a loop of nonobstructed small bowel. Unchanged multiple moderate midline anterior abdominal  fat containing hernias. IMPRESSION: 1. Bilateral pulmonary emboli within the distal main pulmonary arteries extending into all lobes with CT findings of right heart strain. 2. Two distal left ureteral stones measuring up to 6 mm. No hydronephrosis. Multiple additional bilateral nonobstructing renal stones. 3. Unchanged small right inguinal hernia contains a loop of nonobstructed small bowel and multiple moderate midline anterior abdominal fat containing hernias. 4. 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 5. Aortic Atherosclerosis (ICD10-I70.0). Critical Value/emergent results were called by telephone at the time of interpretation on 09/21/2022 at 10:55 am to provider Pricilla Loveless , who verbally acknowledged these results. Electronically Signed   By: Agustin Cree M.D.   On: 09/21/2022 11:00   DG Chest Portable 1 View  Result Date: 09/21/2022 CLINICAL DATA:  Hypoxia. EXAM: PORTABLE CHEST 1 VIEW COMPARISON:  09/30/2019. FINDINGS: Clear lungs. Stable cardiac and mediastinal contours with tortuosity of the thoracic aorta. No pleural effusion or pneumothorax. Visualized bones and upper abdomen are unremarkable. IMPRESSION: No evidence of acute cardiopulmonary disease. Electronically Signed   By: Orvan Falconer M.D.   On: 09/21/2022 09:25    Labs:  CBC: Recent Labs    09/24/22 0224 09/24/22 2156 09/25/22 0323 09/25/22 0824  WBC 10.2 8.9 9.1 8.0  HGB 8.4* 9.2* 8.0* 8.4*  HCT 25.9* 27.8* 24.0* 25.0*  PLT 188 232 233 244    COAGS: Recent Labs    09/21/22 1052  INR 1.1  APTT 27    BMP: Recent Labs    10/21/21 0954 09/21/22 0834 09/22/22 0300 09/24/22 1812  NA 138 134* 133* 130*  K 5.3* 3.8 4.4 4.5  CL 100 98 102 98  CO2 30 28 23 24    GLUCOSE 69* 125* 108* 120*  BUN 22 18 18 19   CALCIUM 9.9 9.4 8.0* 7.7*  CREATININE 1.28 1.29* 1.15 1.27*  GFRNONAA  --  56* >60 57*    LIVER FUNCTION TESTS: Recent Labs    10/21/21 0954 09/21/22 0834  BILITOT 0.7 1.1  AST 27 27  ALT 18 19  ALKPHOS 90 102  PROT 7.0 6.4*  ALBUMIN 4.2 3.5    TUMOR MARKERS: No results for input(s): "AFPTM", "CEA", "CA199", "CHROMGRNA" in the last 8760 hours.  Assessment and Plan:  Pulmonary emboli/DVT; acute blood loss anemia on anticoagulation: Janetta Hora. Borntreger, 82 year old male, is scheduled today for an image-guided inferior vena cava filter placement. The procedure was discussed at the bedside with the patient and his wife.   Risks and benefits discussed with the patient including, but not limited to bleeding, infection, contrast induced renal failure, filter fracture or migration which can lead to emergency surgery or even death, strut penetration with damage or irritation to adjacent structures and caval thrombosis.  All of the patient's questions were answered, patient is agreeable to proceed. He has been NPO. One unit PRBCs ordered by primary teams.   Consent signed and in IR.   Thank you for this interesting consult.  I greatly enjoyed meeting [name] and look forward to participating in their care.  A copy of this report was sent to the requesting provider on this date.  Electronically Signed: Alwyn Ren, AGACNP-BC [phone] 09/25/2022, 8:45 AM   I spent a total of 20 Minutes    in face to face in clinical consultation, greater than 50% of which was counseling/coordinating care for IVC filter placement.

## 2022-09-26 ENCOUNTER — Inpatient Hospital Stay (HOSPITAL_COMMUNITY): Payer: Medicare Other

## 2022-09-26 DIAGNOSIS — I2609 Other pulmonary embolism with acute cor pulmonale: Secondary | ICD-10-CM | POA: Diagnosis not present

## 2022-09-26 LAB — CULTURE, BLOOD (ROUTINE X 2)
Culture: NO GROWTH
Culture: NO GROWTH
Special Requests: ADEQUATE
Special Requests: ADEQUATE

## 2022-09-26 LAB — BASIC METABOLIC PANEL
Anion gap: 6 (ref 5–15)
BUN: 14 mg/dL (ref 8–23)
CO2: 27 mmol/L (ref 22–32)
Calcium: 8 mg/dL — ABNORMAL LOW (ref 8.9–10.3)
Chloride: 102 mmol/L (ref 98–111)
Creatinine, Ser: 1.17 mg/dL (ref 0.61–1.24)
GFR, Estimated: >60 mL/min (ref >60)
Glucose, Bld: 109 mg/dL — ABNORMAL HIGH (ref 70–99)
Potassium: 4.8 mmol/L (ref 3.5–5.1)
Sodium: 135 mmol/L (ref 135–145)

## 2022-09-26 LAB — TYPE AND SCREEN
ABO/RH(D): O POS
Antibody Screen: NEGATIVE
Unit division: 0

## 2022-09-26 LAB — BPAM RBC
Blood Product Expiration Date: 202409152359
ISSUE DATE / TIME: 202408180929
Unit Type and Rh: 5100

## 2022-09-26 LAB — CBC
HCT: 24.4 % — ABNORMAL LOW (ref 39.0–52.0)
Hemoglobin: 8 g/dL — ABNORMAL LOW (ref 13.0–17.0)
MCH: 32.3 pg (ref 26.0–34.0)
MCHC: 32.8 g/dL (ref 30.0–36.0)
MCV: 98.4 fL (ref 80.0–100.0)
Platelets: 233 10*3/uL (ref 150–400)
RBC: 2.48 MIL/uL — ABNORMAL LOW (ref 4.22–5.81)
RDW: 13.7 % (ref 11.5–15.5)
WBC: 6.9 10*3/uL (ref 4.0–10.5)
nRBC: 0 % (ref 0.0–0.2)

## 2022-09-26 MED ORDER — HEPARIN (PORCINE) 25000 UT/250ML-% IV SOLN
1450.0000 [IU]/h | INTRAVENOUS | Status: DC
  Administered 2022-09-26: 1300 [IU]/h via INTRAVENOUS
  Administered 2022-09-27 – 2022-09-29 (×3): 1450 [IU]/h via INTRAVENOUS
  Filled 2022-09-26 (×4): qty 250

## 2022-09-26 MED ORDER — SALINE SPRAY 0.65 % NA SOLN
1.0000 | NASAL | Status: DC | PRN
  Administered 2022-09-26 – 2022-09-28 (×3): 1 via NASAL
  Filled 2022-09-26 (×2): qty 44

## 2022-09-26 NOTE — Progress Notes (Addendum)
PROGRESS NOTE    [name]  WUJ:[id number] DOB: 12-Jul-1940 DOA: 09/21/2022 PCP: Shirline Frees, NP   Brief Narrative: [name] is a 82 y.o. male with a history of BPH, hypertension, hyperlipidemia.  Patient presented secondary to marked exertional dyspnea with feeling that he was going to pass out.  On presentation, he was found to have evidence of a submassive pulmonary embolism.  PCCM was consulted and patient admitted to the ICU.  IR consult for the catheter directed thrombolysis for which she tolerated about 5 hours of treatment prior to development of gross hematuria.  Patient transition to heparin IV and urology consulted for management of gross hematuria.  Foley catheter placed with CBI initiated for management.  Echocardiogram confirmed right heart strain. Patient with continued bleeding on heparin IV, with significant blood loss anemia requiring cessation of heparin and consult to IR for an IVC filter, which was successfully placed on 8/18.   Assessment and Plan:  Submassive pulmonary embolism Diagnosed via CTA chest.  Associated respiratory failure.  Patient was admitted to the ICU and IR consulted for directed thrombolytics.  Echocardiogram this admission confirmed associated right heart strain with evidence of severely reduced right ventricular function and moderately large right ventricular size. Directed thrombolytics tolerated for about 5 hours prior to development of gross hematuria requiring cessation.  Patient transitioned to heparin IV which was eventually stopped on 8/18 secondary to persistent hemorrhaging. IVC filter placed on 8/18. -May need to consider restarting anticoagulation if hemorrhaging remains resolved; if patient's respiratory symptoms remain stable/improved, may be able to avoid anticoagulation  Addendum: Patient with worsening chest tightness of left side. Likely related to known clot burden and worsened with holding anticoagulation. EKG  non-ischemic. Will obtain a chest x-ray to rule out pneumothorax, although I have a low suspicion. Will restart heparin pending chest x-ray results; plan discussed with patient and wife at bedside. Nursing aware of plan as well.  Acute respiratory failure with hypoxia Secondary to pulmonary embolism. -Wean to room air as able  Gross hematuria Occurring post catheter directed thrombolytics.  CT abdomen pelvis obtained and was significant for no retroperitoneal bleed.  Neurology consulted and Foley placed for continuous bladder irrigation.  Recommendation to keep Foley in for at least 1 week and patient continued on Flomax. Hematuria has now resolved after cessation of heparin. -Urology recommendations: CBI; pending today  Acute blood loss anemia Secondary to gross hematuria in setting of anticoagulation use.  Hemoglobin of 14 on admission.  Patient with continued hemorrhaging with hemoglobin down to 8, requiring transfusion of 1 unit of PRBC on 8/18. Post-transfusion hemoglobin of 8.4. -Transfuse for hemoglobin less than 7 now that hemorrhaging has resolved.  Paroxysmal atrial fibrillation Patient is on Coreg as an outpatient.  He is not on a coagulation as an outpatient. Continues to maintain irregular rhythm at this time.  Elevated creatinine Baseline creatinine appears to be around 1-1.3.  Last creatinine of 1.28 from September 2023.  Patient does not meet criteria for AKI at this time.  History of BPH -Continue Flomax  Chronic pain Fibromyalgia -Continue oxycodone as needed  Primary hypertension Patient is on carvedilol, lisinopril as an outpatient, which were held on admission.  Hyperlipidemia Patient is on Lipitor 10 mg daily as an outpatient although listed as not taking.   DVT prophylaxis: Heparin IV Code Status:   Code Status: Full Code Family Communication: None at bedside Disposition Plan: Discharge home likely not for 2 to 5 days pending stable hemoglobin, ability  to  transition to oral anticoagulation if able and PT/OT eval   Consultants:  PCCM Interventional radiology Urology  Procedures:  Catheter directed thrombolytics Transthoracic echocardiogram Foley catheter placement  Antimicrobials: None   Subjective: Patient reports no bleeding. Some left sided chest tightness which has remained persistent. No worsening symptoms.  Objective: BP 138/79   Pulse (!) 54   Temp 98.1 F (36.7 C) (Oral)   Resp (!) 21   Ht 6' (1.829 m)   Wt 80.7 kg   SpO2 99%   BMI 24.13 kg/m   Examination:  General exam: Appears calm and comfortable Respiratory system: Diminished to auscultation. Respiratory effort normal. Cardiovascular system: S1 & S2 heard, irregular rhythm with normal rate.  Gastrointestinal system: Abdomen is nondistended, soft and nontender. Normal bowel sounds heard. Central nervous system: Alert and oriented. No focal neurological deficits. Musculoskeletal: No edema. No calf tenderness Psychiatry: Judgement and insight appear normal. Mood & affect appropriate.    Data Reviewed: I have personally reviewed following labs and imaging studies  CBC Lab Results  Component Value Date   WBC 6.9 09/26/2022   RBC 2.48 (L) 09/26/2022   HGB 8.0 (L) 09/26/2022   HCT 24.4 (L) 09/26/2022   MCV 98.4 09/26/2022   MCH 32.3 09/26/2022   PLT 233 09/26/2022   MCHC 32.8 09/26/2022   RDW 13.7 09/26/2022   LYMPHSABS 1.3 09/21/2022   MONOABS 1.2 (H) 09/21/2022   EOSABS 0.1 09/21/2022   BASOSABS 0.0 09/21/2022     Last metabolic panel Lab Results  Component Value Date   NA 135 09/26/2022   K 4.8 09/26/2022   CL 102 09/26/2022   CO2 27 09/26/2022   BUN 14 09/26/2022   CREATININE 1.17 09/26/2022   GLUCOSE 109 (H) 09/26/2022   GFRNONAA >60 09/26/2022   GFRAA 82 01/17/2020   CALCIUM 8.0 (L) 09/26/2022   PROT 6.4 (L) 09/21/2022   ALBUMIN 3.5 09/21/2022   LABGLOB 2.1 07/26/2016   AGRATIO 2.0 07/26/2016   BILITOT 1.1 09/21/2022    ALKPHOS 102 09/21/2022   AST 27 09/21/2022   ALT 19 09/21/2022   ANIONGAP 6 09/26/2022    GFR: Estimated Creatinine Clearance: 54.3 mL/min (by C-G formula based on SCr of 1.17 mg/dL).  Recent Results (from the past 240 hour(s))  Culture, blood (routine x 2)     Status: None   Collection Time: 09/21/22  8:49 AM   Specimen: Left Antecubital; Blood  Result Value Ref Range Status   Specimen Description   Final    LEFT ANTECUBITAL Performed at [facility], [address] [place], [place] [address]    Special Requests   Final    BOTTLES DRAWN AEROBIC AND ANAEROBIC Blood Culture adequate volume Performed at [facility], [address], [place], [place] [address]    Culture   Final    NO GROWTH 5 DAYS Performed at [facility], [address], [place], [place] [address]    Report Status 09/26/2022 FINAL  Final  Culture, blood (routine x 2)     Status: None   Collection Time: 09/21/22  9:00 AM   Specimen: Right Antecubital; Blood  Result Value Ref Range Status   Specimen Description   Final    RIGHT ANTECUBITAL Performed at [facility], [address] [place], [place] [address]    Special Requests   Final    BOTTLES DRAWN AEROBIC AND ANAEROBIC Blood Culture adequate volume Performed at [facility],  [address] [place], [place] [address]    Culture   Final    NO GROWTH 5 DAYS Performed at [facility], [address] [place], [place] [address]    Report Status 09/26/2022 FINAL  Final  MRSA Next Gen by PCR, Nasal     Status: None   Collection Time: 09/21/22  2:35 PM   Specimen: Nasal Swab  Result Value Ref Range Status   MRSA by PCR Next Gen NOT DETECTED NOT DETECTED Final    Comment: (NOTE) The GeneXpert MRSA Assay (FDA approved for NASAL specimens only), is one component of a comprehensive MRSA colonization surveillance program. It is not intended to diagnose MRSA infection  nor to guide or monitor treatment for MRSA infections. Test performance is not FDA approved in patients less than 18 years old. Performed at [facility], 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 He has been recovering on heparin but has developed complications with significant hematuria as well as bleeding from his temporal scalp resection site. He has had blood loss significant enough to require transfusion. Anticoagulation must be stopped. Therefore, he is a candidate for PE prophylaxis by caval interruption. He presents for placement of an IVC filter. EXAM: ULTRASOUND GUIDANCE FOR VASCULARACCESS IVC CATHETERIZATION AND VENOGRAM IVC FILTER INSERTION Interventional Radiologist:  Sterling Big, MD MEDICATIONS: None. ANESTHESIA/SEDATION: Fentanyl 50 mcg IV; Versed 1 mg IV Moderate Sedation Time:  10 minutes The patient's vital signs and level of consciousness were continuously monitored during the procedure by the interventional radiology nurse under my direct supervision. FLUOROSCOPY TIME:  Radiation exposure index: 3 mGy reference air kerma COMPLICATIONS: None immediate. PROCEDURE: Informed written consent was obtained from the patient after a thorough discussion of the procedural risks, benefits and alternatives. All questions were addressed. Maximal Sterile Barrier Technique was utilized including caps, mask, sterile gowns, sterile gloves, sterile drape, hand hygiene and skin antiseptic. A timeout was performed prior to the initiation of the procedure. Maximal barrier sterile technique utilized including caps, mask, sterile gowns, sterile gloves, large sterile drape, hand  hygiene, and Betadine prep. Under sterile condition and local anesthesia, right internal jugular venous access was performed with ultrasound. An ultrasound image was saved and sent to PACS. Over a guidewire, the IVC filter delivery sheath and inner dilator were advanced into the IVC just above the IVC bifurcation. Contrast injection was performed for an IVC venogram. Through the delivery sheath, a retrievable Bard Denali IVC filter was deployed below the level of the renal veins and above the IVC bifurcation. Limited post deployment venacavagram was performed. The delivery sheath was removed and hemostasis was obtained with manual compression. A dressing was placed. The patient tolerated the procedure well without immediate post procedural complication. FINDINGS: The IVC is patent. No evidence of thrombus, stenosis, or occlusion. No variant venous anatomy. Successful placement of the IVC filter below the level of the renal veins. IMPRESSION: Successful ultrasound and fluoroscopically guided placement of an infrarenal retrievable IVC filter via right jugular approach. PLAN: Due to patient related comorbidities and/or clinical necessity, this IVC filter should be considered a permanent device. This patient will not be actively followed for future filter retrieval. Electronically Signed  By: Malachy Moan M.D.   On: 09/25/2022 12:50      LOS: 5 days    Jacquelin Hawking, MD [facility] 09/26/2022, 9:04 AM   If 7PM-7AM, please contact night-coverage [email or web address]

## 2022-09-26 NOTE — Evaluation (Signed)
Physical Therapy Evaluation Patient Details Name: [name] MRN: [id number] DOB: [birth year] Today's Date: 09/26/2022  History of Present Illness  82 y.o. male with a h BPH, hypertension, hyperlipidemia.  Patient presented secondary to marked exertional dyspnea with feeling that he was going to pass out.  On presentation, he was found to have evidence of a submassive pulmonary embolism.  PCCM was consulted and patient admitted to the ICU.  IR consult for the catheter directed thrombolysis for which she tolerated about 5 hours of treatment prior to development of gross hematuria.  Patient transition to heparin IV and urology consulted for management of gross hematuria.  Foley catheter placed with CBI initiated for management.  Echocardiogram confirmed right heart strain. Patient with continued bleeding on heparin IV, with significant blood loss anemia requiring cessation of heparin and consult to IR for an IVC filter, which was successfully placed on 8/18.  Clinical Impression  Pt admitted with above diagnosis.  Pt motivated, pleasant and agreeable to PT. Overall min assist for bed mobility and transfers with second person needed for line management and safety;  pt with mild dizziness which improved within a couple minutes, BP 118/78. Pt initially on 6L O2, desat to 71% with transfer to chair, recovered to 90s with rest and we were able to decr O2 to 3.5L with SpO2=97%; HR in 80s; RR 16--25--17  Anticipate steady progress in acute setting, pt will likely be able to d/c with f/u HHPT.    Pt currently with functional limitations due to the deficits listed below (see PT Problem List). Pt will benefit from acute skilled PT to increase their independence and safety with mobility to allow discharge.           If plan is discharge home, recommend the following: A little help with walking and/or transfers;A little help with bathing/dressing/bathroom;Assistance with cooking/housework;Help with stairs  or ramp for entrance;Assist for transportation   Can travel by private vehicle        Equipment Recommendations Rolling walker (2 wheels)  Recommendations for Other Services       Functional Status Assessment Patient has had a recent decline in their functional status and demonstrates the ability to make significant improvements in function in a reasonable and predictable amount of time.     Precautions / Restrictions Precautions Precautions: Other (comment);Fall Precaution Comments: 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; multiple lines/foley irrigation Restrictions Weight Bearing Restrictions: No      Mobility  Bed Mobility Overal bed mobility: Needs Assistance Bed Mobility: Supine to Sit     Supine to sit: Min assist     General bed mobility comments: cues to progress LEs off bed, assist with trunk, incr time; mild dizziness with sitting initially, BP 118/78    Transfers Overall transfer level: Needs assistance Equipment used: Rolling walker (2 wheels) Transfers: Sit to/from Stand, Bed to chair/wheelchair/BSC Sit to Stand: Min assist, +2 safety/equipment, From elevated surface           General transfer comment: assist to rise and stabilize, cues for hand placement and control of descent; assist to steady for stand step pivot, pt able to take steps fwd and back to chair    Ambulation/Gait                  Stairs            Wheelchair Mobility     Tilt Bed    Modified Rankin (Stroke Patients Only)  Balance Overall balance assessment: Needs assistance Sitting-balance support: Feet supported, Single extremity supported, No upper extremity supported Sitting balance-Leahy Scale: Fair Sitting balance - Comments: sat EOB x 8 minutes with supervision, in preparation for transfer to chair   Standing balance support: During functional activity, Reliant on assistive device for balance Standing balance-Leahy Scale: Poor                                Pertinent Vitals/Pain Pain Assessment Pain Assessment: Faces Pain Score: 4  Pain Location: penis Pain Descriptors / Indicators: Sore, Discomfort, Constant Pain Intervention(s): Limited activity within patient's tolerance, Monitored during session, Repositioned    Home Living Family/patient expects to be discharged to:: Private residence Living Arrangements: Spouse/significant other;Children   Type of Home: House Home Access: Stairs to enter   Secretary/administrator of Steps: 2 small   Home Layout: One level Home Equipment: None Additional Comments: played tennis for years, now teaches; rides stationary bike routinely    Prior Function Prior Level of Function : Independent/Modified Independent                     Extremity/Trunk Assessment   Upper Extremity Assessment Upper Extremity Assessment: Defer to OT evaluation    Lower Extremity Assessment Lower Extremity Assessment: Generalized weakness       Communication   Communication Communication: No apparent difficulties  Cognition Arousal: Alert Behavior During Therapy: WFL for tasks assessed/performed Overall Cognitive Status: Within Functional Limits for tasks assessed                                          General Comments      Exercises     Assessment/Plan    PT Assessment Patient needs continued PT services  PT Problem List Decreased strength;Decreased range of motion;Decreased activity tolerance;Decreased balance;Decreased mobility;Decreased knowledge of use of DME;Decreased knowledge of precautions       PT Treatment Interventions DME instruction;Gait training;Functional mobility training;Therapeutic activities;Therapeutic exercise;Balance training;Patient/family education    PT Goals (Current goals can be found in the Care Plan section)  Acute Rehab PT Goals PT Goal Formulation: With patient Time For Goal Achievement:  10/10/22 Potential to Achieve Goals: Good    Frequency Min 1X/week     Co-evaluation               AM-PAC PT "6 Clicks" Mobility  Outcome Measure Help needed turning from your back to your side while in a flat bed without using bedrails?: A Little Help needed moving from lying on your back to sitting on the side of a flat bed without using bedrails?: A Little Help needed moving to and from a bed to a chair (including a wheelchair)?: A Little Help needed standing up from a chair using your arms (e.g., wheelchair or bedside chair)?: A Little Help needed to walk in hospital room?: A Lot Help needed climbing 3-5 steps with a railing? : Total 6 Click Score: 15    End of Session Equipment Utilized During Treatment: Gait belt Activity Tolerance: Patient tolerated treatment well Patient left: in chair;with call bell/phone within reach;with chair alarm set Nurse Communication: Mobility status PT Visit Diagnosis: Other abnormalities of gait and mobility (R26.89);Difficulty in walking, not elsewhere classified (R26.2)    Time: 1610-9604 PT Time Calculation (min) (ACUTE ONLY): 29 min  Charges:   PT Evaluation $PT Eval Low Complexity: 1 Low PT Treatments $Gait Training: 8-22 mins PT General Charges $$ ACUTE PT VISIT: 1 Visit         Erandy Mceachern, PT  Acute Rehab Dept [facility]) [phone]  09/26/2022   [facility] 09/26/2022, 12:17 PM

## 2022-09-26 NOTE — Progress Notes (Signed)
ANTICOAGULATION CONSULT NOTE Pharmacy Consult for Heparin Indication: pulmonary embolus  Patient Measurements: Height: 6' (182.9 cm) Weight: 80.7 kg (177 lb 14.6 oz) IBW/kg (Calculated) : 77.6 Heparin Dosing Weight: 76 kg  Medications:  Infusions:   sodium chloride     sodium chloride Stopped (09/22/22 0853)   sodium chloride Stopped (09/22/22 0853)   heparin     promethazine (PHENERGAN) injection (IM or IVPB)     sodium chloride irrigation      Brief Assessment (see full note earlier today from Tacy Learn PharmD.) 82 year old male presented to the ED with dizziness, shortness of breath and abdominal pain.  CT Angio + bilateral PE with RHS.  Pharmacy is consulted to dose Heparin   Significant Events 09/21/22 1710: Start bilateral pulmonary artery infusion of alteplase, 1 mg/hr through each catheter x 12 hours for total TPA dose = 24 mg 8/14 1923: First heparin level = 0.64 - therapeutic 8 hours after 1500 unit bolus & drip at 1300 units/hr.  Per notes pt having some hematuria after foley removed 8/14 at 21:28 - tPA stopped due to hematuria 8/14 at 22:05 - Heparin infusion stopped due to hematuria 09/22/22 01:35 - Dr Warrick Parisian notified pharmacy and requested heparin infusion be resumed with NO BOLUS 09/22/22- CT negative for retroperitoneal bleed 8/18: heparin stopped again d/t bleeding, and IVC filter placed 8/19: per MD, OK to resume heparin w/o bolus, given worsening PE symptoms (chest tightness, SOB)   Today, 09/26/2022:   Currently off heparin  Hgb down to 8.0, Plt stable WNL No current s/s active bleeding; MD would like to conservatively resume IV heparin HL previously therapeutic & stable on 1300 units/hr, although levels had been drifting down slowly in concert with improving SCr  Goal of Therapy:  Heparin level 0.3-0.7 units/ml Monitor platelets by anticoagulation protocol: Yes   Plan:  Resume IV heparin at 1300 units/hr with NO bolus Check 8-hr heparin level Daily CBC  & heparin level Monitor closely for s/s new bleeding   Thank you for allowing pharmacy to be a part of this patient's care.  Bernadene Person, PharmD, BCPS [phone] 09/26/2022, 5:12 PM

## 2022-09-27 DIAGNOSIS — I2609 Other pulmonary embolism with acute cor pulmonale: Secondary | ICD-10-CM | POA: Diagnosis not present

## 2022-09-27 LAB — CBC
HCT: 24.5 % — ABNORMAL LOW (ref 39.0–52.0)
Hemoglobin: 8 g/dL — ABNORMAL LOW (ref 13.0–17.0)
MCH: 32.3 pg (ref 26.0–34.0)
MCHC: 32.7 g/dL (ref 30.0–36.0)
MCV: 98.8 fL (ref 80.0–100.0)
Platelets: 263 10*3/uL (ref 150–400)
RBC: 2.48 MIL/uL — ABNORMAL LOW (ref 4.22–5.81)
RDW: 14 % (ref 11.5–15.5)
WBC: 7.1 10*3/uL (ref 4.0–10.5)
nRBC: 0 % (ref 0.0–0.2)

## 2022-09-27 LAB — HEPARIN LEVEL (UNFRACTIONATED)
Heparin Unfractionated: 0.22 [IU]/mL — ABNORMAL LOW (ref 0.30–0.70)
Heparin Unfractionated: 0.46 [IU]/mL (ref 0.30–0.70)
Heparin Unfractionated: 0.5 [IU]/mL (ref 0.30–0.70)

## 2022-09-27 MED ORDER — DICLOFENAC SODIUM 1 % EX GEL
4.0000 g | Freq: Four times a day (QID) | CUTANEOUS | Status: DC
  Administered 2022-09-27 – 2022-09-30 (×11): 4 g via TOPICAL
  Filled 2022-09-27: qty 100

## 2022-09-27 NOTE — Progress Notes (Signed)
ANTICOAGULATION CONSULT NOTE Pharmacy Consult for Heparin Indication: pulmonary embolus  Patient Measurements: Height: 6' (182.9 cm) Weight: 79.8 kg (175 lb 14.8 oz) IBW/kg (Calculated) : 77.6 Heparin Dosing Weight: 76 kg  Medications:  Infusions:   sodium chloride     sodium chloride Stopped (09/22/22 0853)   sodium chloride Stopped (09/22/22 0853)   heparin 1,450 Units/hr (09/27/22 1718)   promethazine (PHENERGAN) injection (IM or IVPB)      Brief Assessment (see full note earlier today from Tacy Learn PharmD.) 82 year old male presented to the ED with dizziness, shortness of breath and abdominal pain.  CT Angio + bilateral PE with RHS.  Pharmacy is consulted to dose Heparin   Significant Events 09/21/22 1710: Start bilateral pulmonary artery infusion of alteplase, 1 mg/hr through each catheter x 12 hours for total TPA dose = 24 mg 8/14 1923: First heparin level = 0.64 - therapeutic 8 hours after 1500 unit bolus & drip at 1300 units/hr.  Per notes pt having some hematuria after foley removed 8/14 at 21:28 - tPA stopped due to hematuria 8/14 at 22:05 - Heparin infusion stopped due to hematuria 09/22/22 01:35 - Dr Warrick Parisian notified pharmacy and requested heparin infusion be resumed with NO BOLUS 09/22/22- CT negative for retroperitoneal bleed 8/18: heparin stopped again d/t bleeding, and IVC filter placed 8/19: per MD, OK to resume heparin w/o bolus, given worsening PE symptoms (chest tightness, SOB)   Today, 09/27/2022:   Confirmatory heparin level remains therapeutic on 1450 units/hr Hgb 8.0, stable, plts WNL Per RN, no bleeding currently  Goal of Therapy:  Heparin level 0.3-0.7 units/ml Monitor platelets by anticoagulation protocol: Yes   Plan:  Continue heparin drip at current rate of 1450 units/hr (no bolus) Daily CBC & heparin level Monitor closely for s/s new bleeding   Thank you for allowing pharmacy to be a part of this patient's care.  Bernadene Person, PharmD,  BCPS [phone] 09/27/2022, 9:28 PM

## 2022-09-27 NOTE — Progress Notes (Signed)
PROGRESS NOTE    [name]  ZOX:[id number] DOB: [birth year] DOA: 09/21/2022 PCP: Shirline Frees, NP   Brief Narrative: [name] is a 82 y.o. male with a history of BPH, hypertension, hyperlipidemia.  Patient presented secondary to marked exertional dyspnea with feeling that he was going to pass out.  On presentation, he was found to have evidence of a submassive pulmonary embolism.  PCCM was consulted and patient admitted to the ICU.  IR consult for the catheter directed thrombolysis for which she tolerated about 5 hours of treatment prior to development of gross hematuria.  Patient transition to heparin IV and urology consulted for management of gross hematuria.  Foley catheter placed with CBI initiated for management.  Echocardiogram confirmed right heart strain. Patient with continued bleeding on heparin IV, with significant blood loss anemia requiring cessation of heparin and consult to IR for an IVC filter, which was successfully placed on 8/18.   Assessment and Plan:  Submassive pulmonary embolism Diagnosed via CTA chest.  Associated respiratory failure.  Patient was admitted to the ICU and IR consulted for directed thrombolytics.  Echocardiogram this admission confirmed associated right heart strain with evidence of severely reduced right ventricular function and moderately large right ventricular size. Directed thrombolytics tolerated for about 5 hours prior to development of gross hematuria requiring cessation.  Patient transitioned to heparin IV which was eventually stopped on 8/18 secondary to persistent hemorrhaging. IVC filter placed on 8/18.  Patient with worsening left pleuritic chest pain secondary to known PEs.  Heparin IV restarted on 8/19 without bolus.  So far, no recurrent bleeding. -Continue heparin IV and if remained stable without recurrent hemorrhage, can possibly transition to DOAC in 24 hours -Follow-up with interventional radiology as an outpatient for  eventual removal of IVC filter  Acute respiratory failure with hypoxia Secondary to pulmonary embolism. -Wean to room air as able -Ambulatory pulse ox as able  Gross hematuria Occurring post catheter directed thrombolytics.  CT abdomen pelvis obtained and was significant for no retroperitoneal bleed.  Neurology consulted and Foley placed for continuous bladder irrigation.  Recommendation to keep Foley in for at least 1 week and patient continued on Flomax. Hematuria has now resolved after cessation of heparin. -Urology recommendations: Pending  Acute blood loss anemia Secondary to gross hematuria in setting of anticoagulation use.  Hemoglobin of 14 on admission.  Patient with continued hemorrhaging with hemoglobin down to 8, requiring transfusion of 1 unit of PRBC on 8/18. Post-transfusion hemoglobin of 8.4.  Hemoglobin is stable at 8. -Daily CBC unless patient has recurrent hemorrhaging -Transfuse for hemoglobin less than 7 now that hemorrhaging has resolved.  Paroxysmal atrial fibrillation Patient is on Coreg as an outpatient.  He is not on a coagulation as an outpatient. Continues to maintain irregular rhythm at this time.  Foot pain Unclear etiology. -Voltaren gel trial for now  Elevated creatinine Baseline creatinine appears to be around 1-1.3.  Last creatinine of 1.28 from September 2023.  Patient does not meet criteria for AKI at this time.  History of BPH -Continue Flomax  Chronic pain Fibromyalgia -Continue oxycodone as needed  Primary hypertension Patient is on carvedilol, lisinopril as an outpatient, which were held on admission.  Hyperlipidemia Patient is on Lipitor 10 mg daily as an outpatient although listed as not taking.   DVT prophylaxis: Heparin IV Code Status:   Code Status: Full Code Family Communication: None at bedside Disposition Plan: Discharge home likely not for 2 to 5 days pending stable  hemoglobin, ability to transition to oral anticoagulation if  able and PT/OT eval   Consultants:  PCCM Interventional radiology Urology  Procedures:  Catheter directed thrombolytics Transthoracic echocardiogram Foley catheter placement  Antimicrobials: None   Subjective: Patient reports persistent left-sided chest tightness/pain with deep inspiration.  Pain is managed with medication.  No hemorrhaging noted from overnight, however he did get out a blood clot from his nose.  Patient also reports that he developed some left foot pain that is generalized but mostly affecting the dorsal aspect.  He reports no injury to the foot.  The pain is a sharp pain.  No radiation up his leg.  Objective: BP 120/77   Pulse 64   Temp 98.5 F (36.9 C) (Oral)   Resp 18   Ht 6' (1.829 m)   Wt 79.8 kg   SpO2 99%   BMI 23.86 kg/m   Examination:  General exam: Appears calm and comfortable Respiratory system: Clear to auscultation. Respiratory effort normal. Cardiovascular system: S1 & S2 heard, irregular rhythm with normal rate Gastrointestinal system: Abdomen is nondistended, soft and nontender. No organomegaly or masses felt. Normal bowel sounds heard. Central nervous system: Alert and oriented. No focal neurological deficits. Musculoskeletal: No edema. No calf tenderness.  Left foot tenderness mostly located to the dorsal aspect without focal findings Psychiatry: Judgement and insight appear normal. Mood & affect appropriate.    Data Reviewed: I have personally reviewed following labs and imaging studies  CBC Lab Results  Component Value Date   WBC 7.1 09/27/2022   RBC 2.48 (L) 09/27/2022   HGB 8.0 (L) 09/27/2022   HCT 24.5 (L) 09/27/2022   MCV 98.8 09/27/2022   MCH 32.3 09/27/2022   PLT 263 09/27/2022   MCHC 32.7 09/27/2022   RDW 14.0 09/27/2022   LYMPHSABS 1.3 09/21/2022   MONOABS 1.2 (H) 09/21/2022   EOSABS 0.1 09/21/2022   BASOSABS 0.0 09/21/2022     Last metabolic panel Lab Results  Component Value Date   NA 135 09/26/2022    K 4.8 09/26/2022   CL 102 09/26/2022   CO2 27 09/26/2022   BUN 14 09/26/2022   CREATININE 1.17 09/26/2022   GLUCOSE 109 (H) 09/26/2022   GFRNONAA >60 09/26/2022   GFRAA 82 01/17/2020   CALCIUM 8.0 (L) 09/26/2022   PROT 6.4 (L) 09/21/2022   ALBUMIN 3.5 09/21/2022   LABGLOB 2.1 07/26/2016   AGRATIO 2.0 07/26/2016   BILITOT 1.1 09/21/2022   ALKPHOS 102 09/21/2022   AST 27 09/21/2022   ALT 19 09/21/2022   ANIONGAP 6 09/26/2022    GFR: Estimated Creatinine Clearance: 54.3 mL/min (by C-G formula based on SCr of 1.17 mg/dL).  Recent Results (from the past 240 hour(s))  Culture, blood (routine x 2)     Status: None   Collection Time: 09/21/22  8:49 AM   Specimen: Left Antecubital; Blood  Result Value Ref Range Status   Specimen Description   Final    LEFT ANTECUBITAL Performed at [facility], [address], [place], [place] [address]    Special Requests   Final    BOTTLES DRAWN AEROBIC AND ANAEROBIC Blood Culture adequate volume Performed at [facility], [address], [place], [place] [address]    Culture   Final    NO GROWTH 5 DAYS Performed at [facility], [address] [place], [place] [address]    Report Status 09/26/2022 FINAL  Final  Culture, blood (routine x 2)  Status: None   Collection Time: 09/21/22  9:00 AM   Specimen: Right Antecubital; Blood  Result Value Ref Range Status   Specimen Description   Final    RIGHT ANTECUBITAL Performed at [facility], [address], [place], [place] [address]    Special Requests   Final    BOTTLES DRAWN AEROBIC AND ANAEROBIC Blood Culture adequate volume Performed at [facility], [address] [place], [place] [address]    Culture   Final    NO GROWTH 5 DAYS Performed at [facility], [address] [place], [place] [address]    Report Status 09/26/2022 FINAL  Final  MRSA Next Gen by PCR, Nasal     Status: None    Collection Time: 09/21/22  2:35 PM   Specimen: Nasal Swab  Result Value Ref Range Status   MRSA by PCR Next Gen NOT DETECTED NOT DETECTED Final    Comment: (NOTE) The GeneXpert MRSA Assay (FDA approved for NASAL specimens only), is one component of a comprehensive MRSA colonization surveillance program. It is not intended to diagnose MRSA infection nor to guide or monitor treatment for MRSA infections. Test performance is not FDA approved in patients less than 50 years old. Performed at [facility], [address] [place], [place] [address]       Radiology Studies: DG CHEST PORT 1 VIEW  Result Date: 09/26/2022 CLINICAL DATA:  Chest pain EXAM: PORTABLE CHEST 1 VIEW COMPARISON:  Chest x-ray 09/21/2022 FINDINGS: The heart size and mediastinal contours are within normal limits. Both lungs are clear. The visualized skeletal structures are unremarkable. IMPRESSION: No active disease. Electronically Signed   By: Darliss Cheney M.D.   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 He has been recovering on heparin but has developed complications with significant hematuria as well as bleeding from his temporal scalp resection site. He has had blood loss significant enough to require transfusion. Anticoagulation must be stopped. Therefore, he is a candidate for PE prophylaxis by caval interruption. He presents for placement of an IVC filter. EXAM: ULTRASOUND GUIDANCE FOR VASCULARACCESS IVC CATHETERIZATION AND VENOGRAM IVC FILTER INSERTION Interventional Radiologist:  Sterling Big, MD MEDICATIONS: None. ANESTHESIA/SEDATION: Fentanyl 50 mcg IV; Versed 1 mg IV Moderate Sedation Time:  10 minutes The patient's vital signs and  level of consciousness were continuously monitored during the procedure by the interventional radiology nurse under my direct supervision. FLUOROSCOPY TIME:  Radiation exposure index: 3 mGy reference air kerma COMPLICATIONS: None immediate. PROCEDURE: Informed written consent was obtained from the patient after a thorough discussion of the procedural risks, benefits and alternatives. All questions were addressed. Maximal Sterile Barrier Technique was utilized including caps, mask, sterile gowns, sterile gloves, sterile drape, hand hygiene and skin antiseptic. A timeout was performed prior to the initiation of the procedure. Maximal barrier sterile technique utilized including caps, mask, sterile gowns, sterile gloves, large sterile drape, hand hygiene, and Betadine prep. Under sterile condition and local anesthesia, right internal jugular venous access was performed with ultrasound. An ultrasound image was saved and sent to PACS. Over a guidewire, the IVC filter delivery sheath and inner dilator were advanced into the IVC just above the IVC bifurcation. Contrast injection was performed for an  IVC venogram. Through the delivery sheath, a retrievable Bard Denali IVC filter was deployed below the level of the renal veins and above the IVC bifurcation. Limited post deployment venacavagram was performed. The delivery sheath was removed and hemostasis was obtained with manual compression. A dressing was placed. The patient tolerated the procedure well without immediate post procedural complication. FINDINGS: The IVC is patent. No evidence of thrombus, stenosis, or occlusion. No variant venous anatomy. Successful placement of the IVC filter below the level of the renal veins. IMPRESSION: Successful ultrasound and fluoroscopically guided placement of an infrarenal retrievable IVC filter via right jugular approach. PLAN: Due to patient related comorbidities and/or clinical necessity, this IVC filter should be considered a  permanent device. This patient will not be actively followed for future filter retrieval. Electronically Signed   By: Malachy Moan M.D.   On: 09/25/2022 12:50      LOS: 6 days    Jacquelin Hawking, MD [facility] 09/27/2022, 7:46 AM   If 7PM-7AM, please contact night-coverage [email or web address]

## 2022-09-27 NOTE — Progress Notes (Signed)
ANTICOAGULATION CONSULT NOTE Pharmacy Consult for Heparin Indication: pulmonary embolus  Patient Measurements: Height: 6' (182.9 cm) Weight: 79.8 kg (175 lb 14.8 oz) IBW/kg (Calculated) : 77.6 Heparin Dosing Weight: 76 kg  Medications:  Infusions:   sodium chloride     sodium chloride Stopped (09/22/22 0853)   sodium chloride Stopped (09/22/22 0853)   heparin 1,450 Units/hr (09/27/22 1151)   promethazine (PHENERGAN) injection (IM or IVPB)     sodium chloride irrigation      Brief Assessment (see full note earlier today from Tacy Learn PharmD.) 82 year old male presented to the ED with dizziness, shortness of breath and abdominal pain.  CT Angio + bilateral PE with RHS.  Pharmacy is consulted to dose Heparin   Significant Events 09/21/22 1710: Start bilateral pulmonary artery infusion of alteplase, 1 mg/hr through each catheter x 12 hours for total TPA dose = 24 mg 8/14 1923: First heparin level = 0.64 - therapeutic 8 hours after 1500 unit bolus & drip at 1300 units/hr.  Per notes pt having some hematuria after foley removed 8/14 at 21:28 - tPA stopped due to hematuria 8/14 at 22:05 - Heparin infusion stopped due to hematuria 09/22/22 01:35 - Dr Warrick Parisian notified pharmacy and requested heparin infusion be resumed with NO BOLUS 09/22/22- CT negative for retroperitoneal bleed 8/18: heparin stopped again d/t bleeding, and IVC filter placed 8/19: per MD, OK to resume heparin w/o bolus, given worsening PE symptoms (chest tightness, SOB)   Today, 09/27/2022:   Heparin level now therapeutic after rate increased from 1300 to 1450 units/hr early this morning Hgb 8.0, stable, plts WNL Per RN, no bleeding currently  Goal of Therapy:  Heparin level 0.3-0.7 units/ml Monitor platelets by anticoagulation protocol: Yes   Plan:  Continue heparin drip at current rate of 1450 units/hr (no bolus) Check 8-hr confirmatory heparin level Daily CBC & heparin level Monitor closely for s/s new  bleeding   Thank you for allowing pharmacy to be a part of this patient's care.  Hessie Knows, PharmD, BCPS Secure Chat if ?s 09/27/2022 1:21 PM

## 2022-09-27 NOTE — Progress Notes (Signed)
Heparin since yesterday Slow to no CBI now for 24 hours and minimal to no hematuria Still bit oozy at meatus but minimal Stay on flomax Turn on [facility] if hematuria returns to larger amount Will continue to follow Hopefully home soon on blood thinners with foley to see dr Berneice Heinrich as outpatient

## 2022-09-27 NOTE — Progress Notes (Signed)
ANTICOAGULATION CONSULT NOTE Pharmacy Consult for Heparin Indication: pulmonary embolus  Patient Measurements: Height: 6' (182.9 cm) Weight: 80.7 kg (177 lb 14.6 oz) IBW/kg (Calculated) : 77.6 Heparin Dosing Weight: 76 kg  Medications:  Infusions:   sodium chloride     sodium chloride Stopped (09/22/22 0853)   sodium chloride Stopped (09/22/22 0853)   heparin 1,300 Units/hr (09/27/22 0000)   promethazine (PHENERGAN) injection (IM or IVPB)     sodium chloride irrigation      Brief Assessment (see full note earlier today from Tacy Learn PharmD.) 82 year old male presented to the ED with dizziness, shortness of breath and abdominal pain.  CT Angio + bilateral PE with RHS.  Pharmacy is consulted to dose Heparin   Significant Events 09/21/22 1710: Start bilateral pulmonary artery infusion of alteplase, 1 mg/hr through each catheter x 12 hours for total TPA dose = 24 mg 8/14 1923: First heparin level = 0.64 - therapeutic 8 hours after 1500 unit bolus & drip at 1300 units/hr.  Per notes pt having some hematuria after foley removed 8/14 at 21:28 - tPA stopped due to hematuria 8/14 at 22:05 - Heparin infusion stopped due to hematuria 09/22/22 01:35 - Dr Warrick Parisian notified pharmacy and requested heparin infusion be resumed with NO BOLUS 09/22/22- CT negative for retroperitoneal bleed 8/18: heparin stopped again d/t bleeding, and IVC filter placed 8/19: per MD, OK to resume heparin w/o bolus, given worsening PE symptoms (chest tightness, SOB)    Today, 09/27/2022:   HL 0.22 subtherapeutic on 1300 units/hr Hgb 8.0, stable, plts WNL Per RN minimal hematuria  Goal of Therapy:  Heparin level 0.3-0.7 units/ml Monitor platelets by anticoagulation protocol: Yes   Plan:  Increase heparin drip to 1450 units/hr (no bolus) Check 8-hr heparin level Daily CBC & heparin level Monitor closely for s/s new bleeding   Thank you for allowing pharmacy to be a part of this patient's care.  Arley Phenix RPh 09/27/2022, 3:53 AM

## 2022-09-27 NOTE — Evaluation (Signed)
Occupational Therapy Evaluation Patient Details Name: [name] MRN: [id number] DOB: Jul 15, 1940 Today's Date: 09/27/2022   History of Present Illness patient is a 82 year old  male who presented secondary to marked exertional dyspnea with feeling that he was going to pass out.  On presentation, he was found to have evidence of a submassive pulmonary embolism.  PCCM was consulted and patient admitted to the ICU.  IR consult for the catheter directed thrombolysis for which she tolerated about 5 hours of treatment prior to development of gross hematuria.  Patient transition to heparin IV and urology consulted for management of gross hematuria.  Foley catheter placed with CBI initiated for management.  Echocardiogram confirmed right heart strain. Patient with continued bleeding on heparin IV, with significant blood loss anemia requiring cessation of heparin and consult to IR for an IVC filter, which was successfully placed on 8/18.PMH: BPH, hypertension, hyperlipidemia.   Clinical Impression   Patient is a 82 year old male who was admitted for above. Patient was living independently with wife prior level. Currently, patient is +2 for movement with various lines and safety. Patient quick to fatigue with nausea onset. Patient was noted to have decreased functional activity tolerance, decreased endurance, decreased standing balance, decreased safety awareness, and decreased knowledge of AD/AE impacting participation in ADLs. Patient would continue to benefit from skilled OT services at this time while admitted and after d/c to address noted deficits in order to improve overall safety and independence in ADLs.        If plan is discharge home, recommend the following: A lot of help with walking and/or transfers;A lot of help with bathing/dressing/bathroom;Assistance with cooking/housework;Direct supervision/assist for medications management;Assist for transportation;Direct supervision/assist for  financial management;Help with stairs or ramp for entrance    Functional Status Assessment  Patient has had a recent decline in their functional status and demonstrates the ability to make significant improvements in function in a reasonable and predictable amount of time.  Equipment Recommendations  None recommended by OT       Precautions / Restrictions Precautions Precautions: Other (comment);Fall Precaution Comments: 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; multiple lines/foley irrigation Restrictions Weight Bearing Restrictions: No      Mobility Bed Mobility Overal bed mobility: Needs Assistance Bed Mobility: Supine to Sit     Supine to sit: Min assist     General bed mobility comments: cues to progress LEs off bed, assist with trunk, incr time; mild dizziness with sitting initially,        Balance Overall balance assessment: Needs assistance Sitting-balance support: Feet supported, Single extremity supported, No upper extremity supported Sitting balance-Leahy Scale: Fair     Standing balance support: During functional activity, Reliant on assistive device for balance Standing balance-Leahy Scale: Poor                             ADL either performed or assessed with clinical judgement   ADL Overall ADL's : Needs assistance/impaired Eating/Feeding: Supervision/ safety;Sitting   Grooming: Sitting;Wash/dry face;Set up   Upper Body Bathing: Minimal assistance;Sitting   Lower Body Bathing: Maximal assistance;Sitting/lateral leans;Sit to/from stand   Upper Body Dressing : Minimal assistance;Sitting   Lower Body Dressing: Maximal assistance;Sitting/lateral leans;Sit to/from stand   Toilet Transfer: Minimal Cabin crew Details (indicate cue type and reason): with HH with +2 for safety with lines. 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   Lower Extremity Assessment Lower Extremity Assessment: Defer to PT evaluation       Communication     Cognition Arousal: Alert Behavior During Therapy: [facility] for tasks assessed/performed Overall Cognitive Status: Within Functional Limits for tasks assessed               General Comments: wife present as well                Home Living Family/patient expects to be discharged to:: Private residence Living Arrangements: Spouse/significant other;Children Available Help at Discharge: Family;Available PRN/intermittently Type of Home: House Home Access: Stairs to enter [employer] of Steps: 2 small   Home Layout: One level               Home Equipment: None   Additional Comments: played tennis for years, now teaches; rides stationary bike routinely      Prior Functioning/Environment Prior Level of Function : Independent/Modified Independent                        OT Problem List: Decreased activity tolerance;Impaired balance (sitting and/or standing);Decreased coordination;Decreased knowledge of precautions;Decreased safety awareness;Decreased knowledge of use of DME or AE;Cardiopulmonary status limiting activity      OT Treatment/Interventions: Self-care/ADL training;Energy conservation;Therapeutic exercise;DME and/or AE instruction;Therapeutic activities;Patient/family education;Balance training    OT Goals(Current goals  can be found in the care plan section) Acute Rehab OT Goals Patient Stated Goal: to get better OT Goal Formulation: With patient Time For Goal Achievement: 10/11/22 Potential to Achieve Goals: Fair  OT Frequency: Min 1X/week       AM-PAC OT "6 Clicks" Daily Activity     Outcome Measure Help from another person eating meals?: None Help from another person taking care of personal grooming?: A Little Help from another person toileting, which includes using toliet, bedpan, or urinal?: A Lot Help from another person bathing (including washing, rinsing, drying)?: A Lot Help from another person to put on and taking off regular upper body clothing?: A Little Help from another person to put on and taking off regular lower body clothing?: A Lot 6 Click Score: 16   End of Session Nurse Communication: Other (comment) (nurse present in room during session)  Activity Tolerance: Patient tolerated treatment well Patient left: in chair;with call bell/phone within reach;with nursing/sitter in room  OT Visit Diagnosis: Unsteadiness on feet (R26.81);Other abnormalities of gait and mobility (R26.89);Muscle weakness (generalized) (M62.81)                Time: 1100-1126 OT Time Calculation (min): 26 min Charges:  OT General Charges $OT Visit: 1 Visit OT Evaluation $OT Eval Low Complexity: 1 Low OT Treatments $Self Care/Home Management : 8-22 mins  Alex Loud, MS Acute Rehabilitation Department Office# [phone]   Selinda Flavin 09/27/2022, 12:50 PM

## 2022-09-28 DIAGNOSIS — J9601 Acute respiratory failure with hypoxia: Secondary | ICD-10-CM | POA: Diagnosis not present

## 2022-09-28 DIAGNOSIS — I2609 Other pulmonary embolism with acute cor pulmonale: Secondary | ICD-10-CM | POA: Diagnosis not present

## 2022-09-28 DIAGNOSIS — N179 Acute kidney failure, unspecified: Secondary | ICD-10-CM | POA: Diagnosis not present

## 2022-09-28 LAB — COMPREHENSIVE METABOLIC PANEL
ALT: 57 U/L — ABNORMAL HIGH (ref 0–44)
AST: 61 U/L — ABNORMAL HIGH (ref 15–41)
Albumin: 2.6 g/dL — ABNORMAL LOW (ref 3.5–5.0)
Alkaline Phosphatase: 136 U/L — ABNORMAL HIGH (ref 38–126)
Anion gap: 8 (ref 5–15)
BUN: 13 mg/dL (ref 8–23)
CO2: 26 mmol/L (ref 22–32)
Calcium: 8 mg/dL — ABNORMAL LOW (ref 8.9–10.3)
Chloride: 99 mmol/L (ref 98–111)
Creatinine, Ser: 0.94 mg/dL (ref 0.61–1.24)
GFR, Estimated: >60 mL/min (ref >60)
Glucose, Bld: 96 mg/dL (ref 70–99)
Potassium: 4 mmol/L (ref 3.5–5.1)
Sodium: 133 mmol/L — ABNORMAL LOW (ref 135–145)
Total Bilirubin: 0.9 mg/dL (ref 0.3–1.2)
Total Protein: 4.9 g/dL — ABNORMAL LOW (ref 6.5–8.1)

## 2022-09-28 LAB — CBC
HCT: 24.2 % — ABNORMAL LOW (ref 39.0–52.0)
Hemoglobin: 8.1 g/dL — ABNORMAL LOW (ref 13.0–17.0)
MCH: 32.7 pg (ref 26.0–34.0)
MCHC: 33.5 g/dL (ref 30.0–36.0)
MCV: 97.6 fL (ref 80.0–100.0)
Platelets: 321 10*3/uL (ref 150–400)
RBC: 2.48 MIL/uL — ABNORMAL LOW (ref 4.22–5.81)
RDW: 13.7 % (ref 11.5–15.5)
WBC: 7.7 10*3/uL (ref 4.0–10.5)
nRBC: 0 % (ref 0.0–0.2)

## 2022-09-28 LAB — HEPARIN LEVEL (UNFRACTIONATED): Heparin Unfractionated: 0.48 [IU]/mL (ref 0.30–0.70)

## 2022-09-28 MED ORDER — POLYETHYLENE GLYCOL 3350 17 G PO PACK
17.0000 g | PACK | Freq: Two times a day (BID) | ORAL | Status: AC
  Administered 2022-09-28 – 2022-09-30 (×4): 17 g via ORAL
  Filled 2022-09-28 (×4): qty 1

## 2022-09-28 MED ORDER — CARVEDILOL 3.125 MG PO TABS
3.1250 mg | ORAL_TABLET | Freq: Two times a day (BID) | ORAL | Status: DC
  Administered 2022-09-28 – 2022-09-30 (×5): 3.125 mg via ORAL
  Filled 2022-09-28 (×5): qty 1

## 2022-09-28 NOTE — Progress Notes (Signed)
Physical Therapy Treatment Patient Details Name: TEIGAN ODANIEL MRN: [id number] DOB: 01-09-1941 Today's Date: 09/28/2022   History of Present Illness patient is a 82 year old  male who presented secondary to marked exertional dyspnea with feeling that he was going to pass out.  On presentation, he was found to have evidence of a submassive pulmonary embolism.  PCCM was consulted and patient admitted to the ICU.  IR consult for the catheter directed thrombolysis for which she tolerated about 5 hours of treatment prior to development of gross hematuria.  Patient transition to heparin IV and urology consulted for management of gross hematuria.  Foley catheter placed with CBI initiated for management.  Echocardiogram confirmed right heart strain. Patient with continued bleeding on heparin IV, with significant blood loss anemia requiring cessation of heparin and consult to IR for an IVC filter, which was successfully placed on 8/18.PMH: BPH, hypertension, hyperlipidemia.    PT Comments  The patient is eager to get OOB to the recliner.  Patient should be able to progress short distance  ambulation next visit. 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; multiple lines, on O2, Restrictions Weight Bearing Restrictions: No     Mobility  Bed Mobility Overal bed mobility: Needs Assistance       Supine to sit: Min assist     General bed mobility comments: cues to progress LEs off bed, assist with  trunk, incr time; mild dizziness with sitting initially,    Transfers Overall transfer level: Needs assistance Equipment used: Rolling walker (2 wheels) Transfers: Sit to/from Stand, Bed to chair/wheelchair/BSC Sit to Stand: Min assist, +2 safety/equipment, From elevated surface           General transfer comment: assist to rise and stabilize, cues for hand placement and control of descent; assist to steady for stand step pivot, pt able to take steps fwd and back to chair    Ambulation/Gait                   Stairs             Wheelchair Mobility     Tilt Bed    Modified Rankin (Stroke Patients Only)       Balance Overall balance assessment: Needs assistance                                          Cognition Arousal: Alert Behavior During Therapy: WFL for tasks assessed/performed Overall Cognitive Status: Within Functional Limits for tasks assessed                                          Exercises      General Comments        Pertinent Vitals/Pain Pain Assessment Pain Assessment: Faces Faces Pain Scale: Hurts little more Pain Location:  chest with deep breathing Pain Descriptors / Indicators: Sore, Discomfort, Constant Pain Intervention(s): Monitored during session, Limited activity within patient's tolerance    Home Living                          Prior Function            PT Goals (current goals can now be found in the care plan section) Progress towards PT goals: Progressing toward goals    Frequency    Min 1X/week      PT Plan      Co-evaluation PT/OT/SLP Co-Evaluation/Treatment: Yes Reason for Co-Treatment: To address functional/ADL transfers PT goals addressed during session: Mobility/safety with mobility OT goals addressed during session: ADL's and self-care      AM-PAC PT "6 Clicks" Mobility   Outcome Measure  Help needed turning from your back to your side while  in a flat bed without using bedrails?: A Little Help needed moving from lying on your back to sitting on the side of a flat bed without using bedrails?: A Little Help needed moving to and from a bed to a chair (including a wheelchair)?: A Little Help needed standing up from a chair using your arms (e.g., wheelchair or bedside chair)?: A Little Help needed to walk in hospital room?: Total Help needed climbing 3-5 steps with a railing? : Total 6 Click Score: 14    End of Session   Activity Tolerance: Patient tolerated treatment well Patient left: in chair;with call bell/phone within reach;with chair alarm set Nurse Communication: Mobility status PT Visit Diagnosis: Other abnormalities of gait and mobility (R26.89);Difficulty in walking, not elsewhere classified (R26.2)     Time: 1610-9604 PT Time Calculation (min) (ACUTE ONLY): 24 min  Charges:    $Therapeutic Activity: 8-22 mins PT General Charges $$ ACUTE PT VISIT: 1 Visit                     Blanchard Kelch PT Acute Rehabilitation Services Office [phone] Weekend pager-[phone]    Rada Hay 09/28/2022, 2:13 PM

## 2022-09-28 NOTE — Progress Notes (Signed)
Occupational Therapy Treatment Patient Details Name: [name] MRN: [id number] DOB: 1941/01/06 Today's Date: 09/28/2022   History of present illness patient is a 82 year old  male who presented secondary to marked exertional dyspnea with feeling that he was going to pass out.  On presentation, he was found to have evidence of a submassive pulmonary embolism.  PCCM was consulted and patient admitted to the ICU.  IR consult for the catheter directed thrombolysis for which she tolerated about 5 hours of treatment prior to development of gross hematuria.  Patient transition to heparin IV and urology consulted for management of gross hematuria.  Foley catheter placed with CBI initiated for management.  Echocardiogram confirmed right heart strain. Patient with continued bleeding on heparin IV, with significant blood loss anemia requiring cessation of heparin and consult to IR for an IVC filter, which was successfully placed on 8/18.PMH: BPH, hypertension, hyperlipidemia.   OT comments  Patient was able to make some progress towards goals. Patient was able to engage in UB bath/dress tasks with less A compared to evaluation. Patient is motivated to participate in time out of bed today. Patient plans to d/c home with family support at time of d/c. Patient's discharge plan remains appropriate at this time. OT will continue to follow acutely.        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; multiple  lines/foley irrigation Restrictions Weight Bearing Restrictions: No       Mobility Bed Mobility Overal bed mobility: Needs Assistance Bed Mobility: Supine to Sit     Supine to sit: Min assist     General bed mobility comments: cues to progress LEs off bed, assist with trunk, incr time; mild dizziness with sitting initially,            Balance Overall balance assessment: Needs assistance Sitting-balance support: Feet supported, Single extremity supported, No upper extremity supported Sitting balance-Leahy Scale: Fair     Standing balance support: During functional activity, Reliant on assistive device for balance Standing balance-Leahy Scale: Poor                             ADL either performed or assessed with clinical judgement   ADL Overall ADL's : Needs assistance/impaired     Grooming: Sitting;Wash/dry face;Set up;Oral care Grooming Details (indicate cue type and reason): in recliner Upper Body Bathing: Set up;Sitting Upper Body Bathing Details (indicate cue type and reason): in recliner   Lower Body Bathing Details (indicate cue type and reason): declined at this time. Upper Body Dressing : Minimal assistance;Sitting Upper Body Dressing Details (indicate cue type and reason): in recliner     Toilet Transfer: Minimal assistance;Stand-pivot;Rolling walker (2 wheels) Toilet Transfer Details (indicate cue type and reason): with +2 for safety with line management.                  Cognition Arousal: Alert Behavior During Therapy: WFL for tasks assessed/performed Overall Cognitive Status: Within Functional Limits for tasks assessed  Pertinent Vitals/ Pain       Pain Assessment Pain Assessment: Faces Pain Score: 7  Pain Location: chest with deep breathing Pain Intervention(s): Limited activity within patient's tolerance, Monitored during session         Frequency  Min 1X/week        Progress Toward  Goals  OT Goals(current goals can now be found in the care plan section)  Progress towards OT goals: Progressing toward goals     Plan      Co-evaluation    PT/OT/SLP Co-Evaluation/Treatment: Yes Reason for Co-Treatment: To address functional/ADL transfers PT goals addressed during session: Mobility/safety with mobility OT goals addressed during session: ADL's and self-care      AM-PAC OT "6 Clicks" Daily Activity     Outcome Measure   Help from another person eating meals?: None Help from another person taking care of personal grooming?: A Little Help from another person toileting, which includes using toliet, bedpan, or urinal?: A Lot Help from another person bathing (including washing, rinsing, drying)?: A Lot Help from another person to put on and taking off regular upper body clothing?: A Little Help from another person to put on and taking off regular lower body clothing?: A Lot 6 Click Score: 16    End of Session Equipment Utilized During Treatment: Rolling walker (2 wheels)  OT Visit Diagnosis: Unsteadiness on feet (R26.81);Other abnormalities of gait and mobility (R26.89);Muscle weakness (generalized) (M62.81)   Activity Tolerance Patient tolerated treatment well   Patient Left with call bell/phone within reach;in chair   Nurse Communication          Time: 0981-1914 OT Time Calculation (min): 24 min  Charges: OT General Charges $OT Visit: 1 Visit OT Treatments $Self Care/Home Management : 8-22 mins  [name], MS Acute Rehabilitation Department Office# [phone]   Selinda Flavin 09/28/2022, 8:47 AM

## 2022-09-28 NOTE — Progress Notes (Signed)
[facility] PROGRESS NOTE    Progress Note  [name]  ZOX:[id number] DOB: 05-07-1940 DOA: 09/21/2022 PCP: Shirline Frees, NP     Brief Narrative:   [name] is an 82 y.o. male past medical history of BPH, essential hypertension presents to the ED with marked exertional dyspnea and near syncope, 2D echo showed right heart strain was found to have a submassive PE admitted to the ICU IR consulted and performed direct thrombolysis, after 5 hours started developing gross hematuria, urology was consulted.  Foley catheter was placed with CBI initially.  IV heparin was stopped, IR was consulted as hematuria continue to worsen IVC filter was placed on 09/25/2022.   Assessment/Plan:   Acute respiratory failure with hypoxia secondary to submassive Pulmonary emboli (HCC) 2D echo showed right ventricular failure admitted to the ICU IR was consulted for direct thrombolysis started developing hematuria.  Eventually heparin was stopped on 09/25/2022 secondary to persistent gross hematuria. IVC filter placed on 09/25/2022. Heparin restarted on 09/26/2022 without bolus, so far no recurrent hematuria. If he remains without hematuria can probably be transition to a DOAC in 24 hours. Follow-up with interventional radiology as an outpatient to remove IVC filter. Now on 4 L of oxygen out of bed to chair try to wean to room air.  Gross hematuria: Post direct thrombolysis. Urology was consulted Foley was placed with bladder irrigation. Amended to continue Foley for at least a week, continue Flomax and follow-up with them as an outpatient. Very minimal gross hematuria. Can resume irrigation per urology if he develops gross hematuria. Will probably go home with a Foley.  Acute blood loss anemia: Secondary to gross hematuria on admission hemoglobin was 14 down to 8 transfuse 1 unit of packed red blood cells. Hemoglobin today 8.1 has remained relatively stable on IV heparin with very minimal  gross hematuria.  Paroxysmal atrial fibrillation: Continue Coreg for rate control.  Chronic pain/fibromyalgia: Continue Voltaren gel.  History of BPH: Continue Flomax.  Chronic pain/fibromyalgia: Continue oxycodone.  Essential hypertension: Continue Coreg, lisinopril held due to massive PE. Blood pressure is well-controlled lisinopril can be restarted as an outpatient.  Hyperlipidemia: Continue statins.   DVT prophylaxis: heparin Family Communication:none Status is: Inpatient Remains inpatient appropriate because: Gross hematuria in the setting of IV heparin.  Transferred to telemetry.    Code Status:     Code Status Orders  (From admission, onward)           Start     Ordered   09/21/22 1232  Full code  Continuous       Question:  By:  Answer:  Consent: discussion documented in EHR   09/21/22 1233           Code Status History     This patient has a current code status but no historical code status.         IV Access:   Peripheral IV   Procedures and diagnostic studies:   DG CHEST PORT 1 VIEW  Result Date: 09/26/2022 CLINICAL DATA:  Chest pain EXAM: PORTABLE CHEST 1 VIEW COMPARISON:  Chest x-ray 09/21/2022 FINDINGS: The heart size and mediastinal contours are within normal limits. Both lungs are clear. The visualized skeletal structures are unremarkable. IMPRESSION: No active disease. Electronically Signed   By: Darliss Cheney M.D.   On: 09/26/2022 16:35     Medical Consultants:   None.   Subjective:    [name] feels great wants to get out of the bed.  Objective:    Vitals:   09/28/22 0000 09/28/22 0300 09/28/22 0400 09/28/22 0426  BP: 119/75  97/67   Pulse: (!) 58  65   Resp: 15  16   Temp:    99.3 F (37.4 C)  TempSrc:    Axillary  SpO2: 98%  98%   Weight:  76.8 kg    Height:       SpO2: 98 % O2 Flow Rate (L/min): 4 L/min   Intake/Output Summary (Last 24 hours) at 09/28/2022 0749 Last data filed at  09/28/2022 0428 Gross per 24 hour  Intake 485.1 ml  Output 2475 ml  Net -1989.9 ml   Filed Weights   09/26/22 0716 09/27/22 0351 09/28/22 0300  Weight: 80.7 kg 79.8 kg 76.8 kg    Exam: General exam: In no acute distress. Respiratory system: Good air movement and clear to auscultation. Cardiovascular system: S1 & S2 heard, RRR. No JVD. Gastrointestinal system: Abdomen is nondistended, soft and nontender.  Extremities: No pedal edema. Skin: No rashes, lesions or ulcers Psychiatry: Judgement and insight appear normal. Mood & affect appropriate.    Data Reviewed:    Labs: Basic Metabolic Panel: Recent Labs  Lab 09/21/22 0834 09/22/22 0300 09/24/22 1812 09/26/22 0305 09/28/22 0254  NA 134* 133* 130* 135 133*  K 3.8 4.4 4.5 4.8 4.0  CL 98 102 98 102 99  CO2 28 23 24 27 26   GLUCOSE 125* 108* 120* 109* 96  BUN 18 18 19 14 13   CREATININE 1.29* 1.15 1.27* 1.17 0.94  CALCIUM 9.4 8.0* 7.7* 8.0* 8.0*  MG  --  1.8 2.2  --   --    GFR Estimated Creatinine Clearance: 67 mL/min (by C-G formula based on SCr of 0.94 mg/dL). Liver Function Tests: Recent Labs  Lab 09/21/22 0834 09/28/22 0254  AST 27 61*  ALT 19 57*  ALKPHOS 102 136*  BILITOT 1.1 0.9  PROT 6.4* 4.9*  ALBUMIN 3.5 2.6*   Recent Labs  Lab 09/21/22 0834  LIPASE 23   No results for input(s): "AMMONIA" in the last 168 hours. Coagulation profile Recent Labs  Lab 09/21/22 1052  INR 1.1   COVID-19 Labs  No results for input(s): "DDIMER", "FERRITIN", "LDH", "CRP" in the last 72 hours.  Lab Results  Component Value Date   SARSCOV2NAA NEGATIVE 02/17/2020   SARSCOV2NAA NEGATIVE 09/14/2019    CBC: Recent Labs  Lab 09/21/22 0834 09/21/22 1923 09/25/22 0323 09/25/22 0824 09/25/22 2101 09/26/22 0305 09/27/22 0232 09/28/22 0254  WBC 10.6*   < > 9.1 8.0  --  6.9 7.1 7.7  NEUTROABS 7.9*  --   --   --   --   --   --   --   HGB 14.3   < > 8.0* 8.4* 8.4* 8.0* 8.0* 8.1*  HCT 42.3   < > 24.0* 25.0* 24.9*  24.4* 24.5* 24.2*  MCV 97.7   < > 98.8 98.8  --  98.4 98.8 97.6  PLT 241   < > 233 244  --  233 263 321   < > = values in this interval not displayed.   Cardiac Enzymes: No results for input(s): "CKTOTAL", "CKMB", "CKMBINDEX", "TROPONINI" in the last 168 hours. BNP (last 3 results) No results for input(s): "PROBNP" in the last 8760 hours. CBG: Recent Labs  Lab 09/24/22 2324  GLUCAP 93   D-Dimer: No results for input(s): "DDIMER" in the last 72 hours. Hgb A1c: No results for input(s): "HGBA1C" in the last  72 hours. Lipid Profile: No results for input(s): "CHOL", "HDL", "LDLCALC", "TRIG", "CHOLHDL", "LDLDIRECT" in the last 72 hours. Thyroid function studies: No results for input(s): "TSH", "T4TOTAL", "T3FREE", "THYROIDAB" in the last 72 hours.  Invalid input(s): "FREET3" Anemia work up: No results for input(s): "VITAMINB12", "FOLATE", "FERRITIN", "TIBC", "IRON", "RETICCTPCT" in the last 72 hours. Sepsis Labs: Recent Labs  Lab 09/21/22 0840 09/21/22 1923 09/21/22 2239 09/25/22 0824 09/26/22 0305 09/27/22 0232 09/28/22 0254  PROCALCITON  --  <0.10  --   --   --   --   --   WBC  --  14.3*   < > 8.0 6.9 7.1 7.7  LATICACIDVEN 1.1  --   --   --   --   --   --    < > = values in this interval not displayed.   Microbiology Recent Results (from the past 240 hour(s))  Culture, blood (routine x 2)     Status: None   Collection Time: 09/21/22  8:49 AM   Specimen: Left Antecubital; Blood  Result Value Ref Range Status   Specimen Description   Final    LEFT ANTECUBITAL Performed at [facility], [address] [place], [place] [address]    Special Requests   Final    BOTTLES DRAWN AEROBIC AND ANAEROBIC Blood Culture adequate volume Performed at [facility], [address] [place], [place] [address]    Culture   Final    NO GROWTH 5 DAYS Performed at [facility], [address], [place], [place] [address]    Report Status  09/26/2022 FINAL  Final  Culture, blood (routine x 2)     Status: None   Collection Time: 09/21/22  9:00 AM   Specimen: Right Antecubital; Blood  Result Value Ref Range Status   Specimen Description   Final    RIGHT ANTECUBITAL Performed at [facility], [address] [place], [place] [address]    Special Requests   Final    BOTTLES DRAWN AEROBIC AND ANAEROBIC Blood Culture adequate volume Performed at [facility], [address] [place], [place] [address]    Culture   Final    NO GROWTH 5 DAYS Performed at [facility], [address], [place], [place] [address]    Report Status 09/26/2022 FINAL  Final  MRSA Next Gen by PCR, Nasal     Status: None   Collection Time: 09/21/22  2:35 PM   Specimen: Nasal Swab  Result Value Ref Range Status   MRSA by PCR Next Gen NOT DETECTED NOT DETECTED Final    Comment: (NOTE) The GeneXpert MRSA Assay (FDA approved for NASAL specimens only), is one component of a comprehensive MRSA colonization surveillance program. It is not intended to diagnose MRSA infection nor to guide or monitor treatment for MRSA infections. Test performance is not FDA approved in patients less than 34 years old. Performed at [facility], [address] [place], [place] [address]      Medications:    buPROPion  150 mg Oral Daily   Chlorhexidine Gluconate Cloth  6 each Topical Daily   diclofenac Sodium  4 g Topical QID   oxybutynin  5 mg Oral TID   polyethylene glycol  17 g Oral Daily   sodium chloride flush  3 mL Intravenous Q12H   tamsulosin  0.4 mg Oral Daily   Continuous Infusions:  sodium chloride     sodium  chloride Stopped (09/22/22 0853)   sodium chloride Stopped (09/22/22 0853)   heparin 1,450 Units/hr (09/28/22 0537)   promethazine (PHENERGAN) injection (IM or IVPB)        LOS: 7 days   Marinda Elk  [facility]  09/28/2022, 7:49 AM

## 2022-09-28 NOTE — Progress Notes (Signed)
ANTICOAGULATION CONSULT NOTE Pharmacy Consult for Heparin Indication: pulmonary embolus  Patient Measurements: Height: 6' (182.9 cm) Weight: 76.8 kg (169 lb 5 oz) IBW/kg (Calculated) : 77.6 Heparin Dosing Weight: 76 kg  Medications:  Infusions:   sodium chloride     sodium chloride Stopped (09/22/22 0853)   sodium chloride Stopped (09/22/22 0853)   heparin 1,450 Units/hr (09/28/22 0537)   promethazine (PHENERGAN) injection (IM or IVPB)      Brief Assessment (see full note earlier today from Tacy Learn PharmD.) 82 year old male presented to the ED with dizziness, shortness of breath and abdominal pain.  CT Angio + bilateral PE with RHS.  Pharmacy is consulted to dose Heparin   Significant Events 09/21/22 1710: Start bilateral pulmonary artery infusion of alteplase, 1 mg/hr through each catheter x 12 hours for total TPA dose = 24 mg 8/14 1923: First heparin level = 0.64 - therapeutic 8 hours after 1500 unit bolus & drip at 1300 units/hr.  Per notes pt having some hematuria after foley removed 8/14 at 21:28 - tPA stopped due to hematuria 8/14 at 22:05 - Heparin infusion stopped due to hematuria 09/22/22 01:35 - Dr Warrick Parisian notified pharmacy and requested heparin infusion be resumed with NO BOLUS 09/22/22- CT negative for retroperitoneal bleed 8/18: heparin stopped again d/t bleeding, and IVC filter placed 8/19: per MD, OK to resume heparin w/o bolus, given worsening PE symptoms (chest tightness, SOB)   Today, 09/28/2022:   Heparin level remains therapeutic on 1450 units/hr Hgb 8.0, stable, plts WNL Per RN, no bleeding currently  Goal of Therapy:  Heparin level 0.3-0.7 units/ml Monitor platelets by anticoagulation protocol: Yes   Plan:  Continue heparin drip at current rate of 1450 units/hr (no bolus) Daily CBC & heparin level Monitor closely for s/s new bleeding   Thank you for allowing pharmacy to be a part of this patient's care.  Hessie Knows, PharmD, BCPS Secure Chat  if ?s 09/28/2022 8:06 AM

## 2022-09-29 ENCOUNTER — Other Ambulatory Visit (HOSPITAL_COMMUNITY): Payer: Self-pay

## 2022-09-29 DIAGNOSIS — I2609 Other pulmonary embolism with acute cor pulmonale: Secondary | ICD-10-CM | POA: Diagnosis not present

## 2022-09-29 DIAGNOSIS — N179 Acute kidney failure, unspecified: Secondary | ICD-10-CM | POA: Diagnosis not present

## 2022-09-29 LAB — PROTIME-INR
INR: 1.2 (ref 0.8–1.2)
Prothrombin Time: 15.3 seconds — ABNORMAL HIGH (ref 11.4–15.2)

## 2022-09-29 LAB — CBC
HCT: 26.9 % — ABNORMAL LOW (ref 39.0–52.0)
Hemoglobin: 8.6 g/dL — ABNORMAL LOW (ref 13.0–17.0)
MCH: 31.3 pg (ref 26.0–34.0)
MCHC: 32 g/dL (ref 30.0–36.0)
MCV: 97.8 fL (ref 80.0–100.0)
Platelets: 390 10*3/uL (ref 150–400)
RBC: 2.75 MIL/uL — ABNORMAL LOW (ref 4.22–5.81)
RDW: 14 % (ref 11.5–15.5)
WBC: 8.3 10*3/uL (ref 4.0–10.5)
nRBC: 0 % (ref 0.0–0.2)

## 2022-09-29 LAB — HEPARIN LEVEL (UNFRACTIONATED): Heparin Unfractionated: 0.56 [IU]/mL (ref 0.30–0.70)

## 2022-09-29 MED ORDER — APIXABAN 5 MG PO TABS: 5.0000 mg | ORAL_TABLET | Freq: Two times a day (BID) | ORAL | Status: DC

## 2022-09-29 MED ORDER — WARFARIN - PHARMACIST DOSING INPATIENT: Freq: Every day | Status: DC

## 2022-09-29 MED ORDER — WARFARIN SODIUM 5 MG PO TABS
7.5000 mg | ORAL_TABLET | Freq: Once | ORAL | Status: AC
  Administered 2022-09-29: 7.5 mg via ORAL
  Filled 2022-09-29: qty 1

## 2022-09-29 MED ORDER — HEPARIN (PORCINE) 25000 UT/250ML-% IV SOLN
1450.0000 [IU]/h | INTRAVENOUS | Status: DC
  Administered 2022-09-29 (×2): 1450 [IU]/h via INTRAVENOUS
  Filled 2022-09-29 (×3): qty 250

## 2022-09-29 NOTE — Care Management Important Message (Signed)
Important Message  Patient Details IM Letter given. Name: [name] MRN: [id number] Date of Birth: Sep 03, 1940   Medicare Important Message Given:  Yes     Caren Macadam 09/29/2022, 10:54 AM

## 2022-09-29 NOTE — Progress Notes (Signed)
Urine pink but a bit red last night Minimal bloody discharge from meatus HB 8.6 Sill on heparin Will follow but OK to manage blood thinners as needed  Follow up with Dr Berneice Heinrich or our extender in the office with foley on flomax and for trial of voiding next week

## 2022-09-29 NOTE — Discharge Instructions (Addendum)
Information on my medicine - Coumadin   (Warfarin)  This medication education was reviewed with me or my healthcare representative as part of my discharge preparation.   Why was Coumadin prescribed for you? Coumadin was prescribed for you because you have a blood clot or a medical condition that can cause an increased risk of forming blood clots. Blood clots can cause serious health problems by blocking the flow of blood to the heart, lung, or brain. Coumadin can prevent harmful blood clots from forming. As a reminder your indication for Coumadin is: blood clots your lungs and leg  What test will check on my response to Coumadin? While on Coumadin (warfarin) you will need to have an INR test regularly to ensure that your dose is keeping you in the desired range. The INR (international normalized ratio) number is calculated from the result of the laboratory test called prothrombin time (PT).  If an INR APPOINTMENT HAS NOT ALREADY BEEN MADE FOR YOU please schedule an appointment to have this lab work done by your health care provider within 7 days. Your INR goal is usually a number between:  2 to 3 or your provider may give you a more narrow range like 2-2.5.  Ask your health care provider during an office visit what your goal INR is.  What  do you need to  know  About  COUMADIN? Take Coumadin (warfarin) exactly as prescribed by your healthcare provider about the same time each day.  DO NOT stop taking without talking to the doctor who prescribed the medication.  Stopping without other blood clot prevention medication to take the place of Coumadin may increase your risk of developing a new clot or stroke.  Get refills before you run out.  What do you do if you miss a dose? If you miss a dose, take it as soon as you remember on the same day then continue your regularly scheduled regimen the next day.  Do not take two doses of Coumadin at the same time.  Important Safety Information A possible side  effect of Coumadin (Warfarin) is an increased risk of bleeding. You should call your healthcare provider right away if you experience any of the following: Bleeding from an injury or your nose that does not stop. Unusual colored urine (red or dark brown) or unusual colored stools (red or black). Unusual bruising for unknown reasons. A serious fall or if you hit your head (even if there is no bleeding).  Some foods or medicines interact with Coumadin (warfarin) and might alter your response to warfarin. To help avoid this: Eat a balanced diet, maintaining a consistent amount of Vitamin K. Notify your provider about major diet changes you plan to make. Avoid alcohol or limit your intake to 1 drink for women and 2 drinks for men per day. (1 drink is 5 oz. wine, 12 oz. beer, or 1.5 oz. liquor.)  Make sure that ANY health care provider who prescribes medication for you knows that you are taking Coumadin (warfarin).  Also make sure the healthcare provider who is monitoring your Coumadin knows when you have started a new medication including herbals and non-prescription products.  Coumadin (Warfarin)  Major Drug Interactions  Increased Warfarin Effect Decreased Warfarin Effect  Alcohol (large quantities) Antibiotics (esp. Septra/Bactrim, Flagyl, Cipro) Amiodarone (Cordarone) Aspirin (ASA) Cimetidine (Tagamet) Megestrol (Megace) NSAIDs (ibuprofen, naproxen, etc.) Piroxicam (Feldene) Propafenone (Rythmol SR) Propranolol (Inderal) Isoniazid (INH) Posaconazole (Noxafil) Barbiturates (Phenobarbital) Carbamazepine (Tegretol) Chlordiazepoxide (Librium) Cholestyramine (Questran) Griseofulvin Oral Contraceptives Rifampin  Sucralfate (Carafate) Vitamin K   Coumadin (Warfarin) Major Herbal Interactions  Increased Warfarin Effect Decreased Warfarin Effect  Garlic Ginseng Ginkgo biloba Coenzyme Q10 Green tea St. John's wort    Coumadin (Warfarin) FOOD Interactions  Eat a consistent  number of servings per week of foods HIGH in Vitamin K (1 serving =  cup)  Collards (cooked, or boiled & drained) Kale (cooked, or boiled & drained) Mustard greens (cooked, or boiled & drained) Parsley *serving size only =  cup Spinach (cooked, or boiled & drained) Swiss chard (cooked, or boiled & drained) Turnip greens (cooked, or boiled & drained)  Eat a consistent number of servings per week of foods MEDIUM-HIGH in Vitamin K (1 serving = 1 cup)  Asparagus (cooked, or boiled & drained) Broccoli (cooked, boiled & drained, or raw & chopped) Brussel sprouts (cooked, or boiled & drained) *serving size only =  cup Lettuce, raw (green leaf, endive, romaine) Spinach, raw Turnip greens, raw & chopped   These websites have more information on Coumadin (warfarin):  http://www.king-russell.com/; https://www.hines.net/;

## 2022-09-29 NOTE — Progress Notes (Addendum)
ANTICOAGULATION CONSULT NOTE - Follow Up Consult  Pharmacy Consult for heparin  Indication: acute pulmonary embolus and DVT  Allergies  Allergen Reactions   Other Other (See Comments)    Mangos - caused a severe rash   Prozac [Fluoxetine Hcl]     Made him feel "loopy"   Sulfa Antibiotics Hives   Vicodin [Hydrocodone-Acetaminophen] Itching    Tolerates oxycodone   Erythromycin Rash    Many years ago an oral antibiotic caused a rash(wife and patient think it was erythromycin but they are not positive).    Patient Measurements: Height: 6' (182.9 cm) Weight: 76.8 kg (169 lb 5 oz) IBW/kg (Calculated) : 77.6 Heparin Dosing Weight: 77 kg  Vital Signs: Temp: 98.9 F (37.2 C) (08/22 0603) Temp Source: Oral (08/22 0603) BP: 119/72 (08/22 0603) Pulse Rate: 59 (08/22 0603)  Labs: Recent Labs    09/27/22 0232 09/27/22 1216 09/27/22 2050 09/28/22 0254 09/29/22 0547  HGB 8.0*  --   --  8.1* 8.6*  HCT 24.5*  --   --  24.2* 26.9*  PLT 263  --   --  321 390  HEPARINUNFRC 0.22*   < > 0.50 0.48 0.56  CREATININE  --   --   --  0.94  --    < > = values in this interval not displayed.    Estimated Creatinine Clearance: 67 mL/min (by C-G formula based on SCr of 0.94 mg/dL).  Assessment: Patient is an 82 y.o M with recent COVID infection who presented to the ED on 09/21/22 with c/o abdominal pain, HA, SOB and dizziness. He was subsequently found to have acute bilateral PE with RHS and RLE DVT. Catheter directed tPA initiated on 8/14, but stopped after 4 hours due to bleeding. IVC filter placed on 09/25/22.  He's currently on heparin drip for acute VTE treatment.   Significant events:  - 8/14 chest CT: acute BL PE with RHS. Ureteral stones. - 8/14 LE doppler: acute RLE  - 8/14: B catheter directed thrombolysis. RN reported bleeding bleeding from penis, surgical site on his face, and hematuria. CCM consulted, stopped tpa @ 2128 (only got tPA for 4 hrs) - 8/15: heparin resumed with NO  BOLUS - 8/18: heparin d/c'd due to bleeding and IVC filter placed. - 8/19: heparin resumed given worsening PE sx's (chest tightness, SOB)  Today, 09/29/2022: - heparin level is therapeutic at 0.56 - cbc stable  - pt's having intermittent hematuria. Urine documented this morning as red with blood clot. Currently off CBI  Goal of Therapy:  Heparin level 0.3-0.7 units/ml Monitor platelets by anticoagulation protocol: Yes   Plan:  - continue heparin drip at 1450 units/hr - daily heparin level and cbc - monitor for severity of hematuria  Burna Atlas P 09/29/2022,7:19 AM  ___________________________________  Adden: pharmacy has been consulted to transition patient to Eliquis. - With on going hematuria and with patient being on heparin drip >7 days, Dr. David Stall prefers [place] to start patient on Eliquis 5 mg bid dose.    - d/c heparin drip - start Eliquis 5mg  bid bid - pharmacy will sign off for Eliquis but will follow patient peripherally along with you.   Dorna Leitz, PharmD, BCPS 09/29/2022 11:17 AM __________________________________________  Adden: Patient only has regular Medicare and no Part D. Per Dr. Andre Lefort, change Eliquis to warfarin due to high cost of DOAC and resume heparin drip for bridging. - resume heparin drip at 1450 units/hr  - warfarin 7.5 mg PO x1 today  Dorna Leitz, PharmD, BCPS 09/29/2022 12:22 PM

## 2022-09-29 NOTE — Plan of Care (Signed)

## 2022-09-29 NOTE — Progress Notes (Signed)
[facility] PROGRESS NOTE    Progress Note  [name]  FIE:[id number] DOB: 1940/07/27 DOA: 09/21/2022 PCP: Shirline Frees, NP     Brief Narrative:   [name] is an 82 y.o. male past medical history of BPH, essential hypertension presents to the ED with marked exertional dyspnea and near syncope, 2D echo showed right heart strain was found to have a submassive PE admitted to the ICU, 2D echo showed right ventricular failure, IR consulted and performed direct thrombolysis, after 5 hours started developing gross hematuria, urology was consulted.  Foley catheter was placed with CBI initially.  IV heparin was stopped, IR was consulted as hematuria continue to worsen IVC filter was placed on 09/25/2022.   Assessment/Plan:   Acute respiratory failure with hypoxia secondary to submassive Pulmonary emboli (HCC) Heparin restarted on 09/26/2022 without bolus, so far no recurrent hematuria. Transition to oral DOAC and monitor for 24 hours. Follow-up with interventional radiology as an outpatient to remove IVC filter. No acute dose of oxygen therapy to room air. Out of bed to chair ad lib.  Gross hematuria: Secondary to thrombolysis. Urology was consulted Foley was placed with bladder irrigation. Continue Foley for at least a week, continue Flomax and follow-up with them as an outpatient. Will probably go home with a Foley voiding trial as an outpatient. PT evaluated the patient, will need home health PT.   Acute blood loss anemia: Secondary to gross hematuria on admission hemoglobin was 14 down to 8 transfuse 1 unit of packed red blood cells. Hemoglobin today continues to rise.  Paroxysmal atrial fibrillation: Continue Coreg for rate control.  Chronic pain/fibromyalgia: Continue Voltaren gel.  History of BPH: Continue Flomax.  Chronic pain/fibromyalgia: Continue oxycodone.  Essential hypertension: Continue Coreg, lisinopril held due to massive PE. Blood  pressure is well-controlled lisinopril can be restarted as an outpatient.  Hyperlipidemia: Continue statins.   DVT prophylaxis: heparin Family Communication:none Status is: Inpatient Remains inpatient appropriate because: Gross hematuria in the setting of IV heparin.  Transferred to telemetry.    Code Status:     Code Status Orders  (From admission, onward)           Start     Ordered   09/21/22 1232  Full code  Continuous       Question:  By:  Answer:  Consent: discussion documented in EHR   09/21/22 1233           Code Status History     This patient has a current code status but no historical code status.         IV Access:   Peripheral IV   Procedures and diagnostic studies:   No results found.   Medical Consultants:   None.   Subjective:    [name] ambulating feels better.  Objective:    Vitals:   09/28/22 2327 09/29/22 0603 09/29/22 1017 09/29/22 1026  BP: 107/75 119/72 121/67   Pulse: 72 (!) 59 88   Resp: 14 14    Temp: 99.1 F (37.3 C) 98.9 F (37.2 C)    TempSrc: Oral Oral    SpO2: 96% 95%  95%  Weight:      Height:       SpO2: 95 % O2 Flow Rate (L/min): 2 L/min   Intake/Output Summary (Last 24 hours) at 09/29/2022 1043 Last data filed at 09/29/2022 1028 Gross per 24 hour  Intake --  Output 2025 ml  Net -2025 ml   [employer]  09/26/22 0716 09/27/22 0351 09/28/22 0300  Weight: 80.7 kg 79.8 kg 76.8 kg    Exam: General exam: In no acute distress. Respiratory system: Good air movement and clear to auscultation. Cardiovascular system: S1 & S2 heard, RRR. No JVD. Gastrointestinal system: Abdomen is nondistended, soft and nontender.  Extremities: No pedal edema. Skin: No rashes, lesions or ulcers Psychiatry: Judgement and insight appear normal. Mood & affect appropriate.  Data Reviewed:    Labs: Basic Metabolic Panel: Recent Labs  Lab 09/24/22 1812 09/26/22 0305 09/28/22 0254  NA 130* 135  133*  K 4.5 4.8 4.0  CL 98 102 99  CO2 24 27 26   GLUCOSE 120* 109* 96  BUN 19 14 13   CREATININE 1.27* 1.17 0.94  CALCIUM 7.7* 8.0* 8.0*  MG 2.2  --   --    GFR Estimated Creatinine Clearance: 67 mL/min (by C-G formula based on SCr of 0.94 mg/dL). Liver Function Tests: Recent Labs  Lab 09/28/22 0254  AST 61*  ALT 57*  ALKPHOS 136*  BILITOT 0.9  PROT 4.9*  ALBUMIN 2.6*   No results for input(s): "LIPASE", "AMYLASE" in the last 168 hours.  No results for input(s): "AMMONIA" in the last 168 hours. Coagulation profile No results for input(s): "INR", "PROTIME" in the last 168 hours.  COVID-19 Labs  No results for input(s): "DDIMER", "FERRITIN", "LDH", "CRP" in the last 72 hours.  Lab Results  Component Value Date   SARSCOV2NAA NEGATIVE 02/17/2020   SARSCOV2NAA NEGATIVE 09/14/2019    CBC: Recent Labs  Lab 09/25/22 0824 09/25/22 2101 09/26/22 0305 09/27/22 0232 09/28/22 0254 09/29/22 0547  WBC 8.0  --  6.9 7.1 7.7 8.3  HGB 8.4* 8.4* 8.0* 8.0* 8.1* 8.6*  HCT 25.0* 24.9* 24.4* 24.5* 24.2* 26.9*  MCV 98.8  --  98.4 98.8 97.6 97.8  PLT 244  --  233 263 321 390   Cardiac Enzymes: No results for input(s): "CKTOTAL", "CKMB", "CKMBINDEX", "TROPONINI" in the last 168 hours. BNP (last 3 results) No results for input(s): "PROBNP" in the last 8760 hours. CBG: Recent Labs  Lab 09/24/22 2324  GLUCAP 93   D-Dimer: No results for input(s): "DDIMER" in the last 72 hours. Hgb A1c: No results for input(s): "HGBA1C" in the last 72 hours. Lipid Profile: No results for input(s): "CHOL", "HDL", "LDLCALC", "TRIG", "CHOLHDL", "LDLDIRECT" in the last 72 hours. Thyroid function studies: No results for input(s): "TSH", "T4TOTAL", "T3FREE", "THYROIDAB" in the last 72 hours.  Invalid input(s): "FREET3" Anemia work up: No results for input(s): "VITAMINB12", "FOLATE", "FERRITIN", "TIBC", "IRON", "RETICCTPCT" in the last 72 hours. Sepsis Labs: Recent Labs  Lab 09/26/22 0305  09/27/22 0232 09/28/22 0254 09/29/22 0547  WBC 6.9 7.1 7.7 8.3   Microbiology Recent Results (from the past 240 hour(s))  Culture, blood (routine x 2)     Status: None   Collection Time: 09/21/22  8:49 AM   Specimen: Left Antecubital; Blood  Result Value Ref Range Status   Specimen Description   Final    LEFT ANTECUBITAL Performed at [facility], [address], [place], [place] [address]    Special Requests   Final    BOTTLES DRAWN AEROBIC AND ANAEROBIC Blood Culture adequate volume Performed at [facility], [address] [place], [place] [address]    Culture   Final    NO GROWTH 5 DAYS Performed at [facility], [address], [place], [place] [address]    Report Status 09/26/2022 FINAL  Final  Culture, blood (routine x 2)     Status: None   Collection Time: 09/21/22  9:00 AM   Specimen: Right Antecubital; Blood  Result Value Ref Range Status   Specimen Description   Final    RIGHT ANTECUBITAL Performed at [facility], [address] [place], [place] [address]    Special Requests   Final    BOTTLES DRAWN AEROBIC AND ANAEROBIC Blood Culture adequate volume Performed at [facility], [address], [place], [place] [address]    Culture   Final    NO GROWTH 5 DAYS Performed at [facility], [address] [place], [place] [address]    Report Status 09/26/2022 FINAL  Final  MRSA Next Gen by PCR, Nasal     Status: None   Collection Time: 09/21/22  2:35 PM   Specimen: Nasal Swab  Result Value Ref Range Status   MRSA by PCR Next Gen NOT DETECTED NOT DETECTED Final    Comment: (NOTE) The GeneXpert MRSA Assay (FDA approved for NASAL specimens only), is one component of a comprehensive MRSA colonization surveillance program. It is not intended to diagnose MRSA infection nor to guide or monitor treatment for MRSA infections. Test performance is not FDA approved in patients less  than 9 years old. Performed at [facility], [address] [place], [place] [address]      Medications:    buPROPion  150 mg Oral Daily   carvedilol  3.125 mg Oral BID   Chlorhexidine Gluconate Cloth  6 each Topical Daily   diclofenac Sodium  4 g Topical QID   oxybutynin  5 mg Oral TID   polyethylene glycol  17 g Oral BID   sodium chloride flush  3 mL Intravenous Q12H   tamsulosin  0.4 mg Oral Daily   Continuous Infusions:  sodium chloride     sodium chloride Stopped (09/22/22 0853)   sodium chloride Stopped (09/22/22 0853)   heparin 1,450 Units/hr (09/29/22 0046)   promethazine (PHENERGAN) injection (IM or IVPB)        LOS: 8 days   Marinda Elk  [facility]  09/29/2022, 10:43 AM

## 2022-09-30 ENCOUNTER — Inpatient Hospital Stay (HOSPITAL_COMMUNITY): Payer: Medicare Other

## 2022-09-30 ENCOUNTER — Encounter: Payer: Medicare Other | Admitting: Plastic Surgery

## 2022-09-30 DIAGNOSIS — I2609 Other pulmonary embolism with acute cor pulmonale: Secondary | ICD-10-CM | POA: Diagnosis not present

## 2022-09-30 DIAGNOSIS — N179 Acute kidney failure, unspecified: Secondary | ICD-10-CM | POA: Diagnosis not present

## 2022-09-30 LAB — URINALYSIS, ROUTINE W REFLEX MICROSCOPIC
RBC / HPF: >50 RBC/hpf (ref 0–5)
WBC, UA: >50 WBC/hpf (ref 0–5)

## 2022-09-30 LAB — PROTIME-INR
INR: 1.3 — ABNORMAL HIGH (ref 0.8–1.2)
Prothrombin Time: 16.8 seconds — ABNORMAL HIGH (ref 11.4–15.2)

## 2022-09-30 LAB — CBC
HCT: 26.9 % — ABNORMAL LOW (ref 39.0–52.0)
Hemoglobin: 8.7 g/dL — ABNORMAL LOW (ref 13.0–17.0)
MCH: 31.8 pg (ref 26.0–34.0)
MCHC: 32.3 g/dL (ref 30.0–36.0)
MCV: 98.2 fL (ref 80.0–100.0)
Platelets: 433 10*3/uL — ABNORMAL HIGH (ref 150–400)
RBC: 2.74 MIL/uL — ABNORMAL LOW (ref 4.22–5.81)
RDW: 13.9 % (ref 11.5–15.5)
WBC: 9.7 10*3/uL (ref 4.0–10.5)
nRBC: 0 % (ref 0.0–0.2)

## 2022-09-30 LAB — HEPARIN LEVEL (UNFRACTIONATED): Heparin Unfractionated: 0.32 [IU]/mL (ref 0.30–0.70)

## 2022-09-30 LAB — BASIC METABOLIC PANEL
Anion gap: 6 (ref 5–15)
BUN: 21 mg/dL (ref 8–23)
CO2: 26 mmol/L (ref 22–32)
Calcium: 8.2 mg/dL — ABNORMAL LOW (ref 8.9–10.3)
Chloride: 97 mmol/L — ABNORMAL LOW (ref 98–111)
Creatinine, Ser: 1.4 mg/dL — ABNORMAL HIGH (ref 0.61–1.24)
GFR, Estimated: 50 mL/min — ABNORMAL LOW (ref >60)
Glucose, Bld: 104 mg/dL — ABNORMAL HIGH (ref 70–99)
Potassium: 4.6 mmol/L (ref 3.5–5.1)
Sodium: 129 mmol/L — ABNORMAL LOW (ref 135–145)

## 2022-09-30 LAB — PHOSPHORUS: Phosphorus: 3.2 mg/dL (ref 2.5–4.6)

## 2022-09-30 LAB — MAGNESIUM: Magnesium: 2.1 mg/dL (ref 1.7–2.4)

## 2022-09-30 MED ORDER — SODIUM CHLORIDE 0.9 % IV BOLUS
1000.0000 mL | Freq: Once | INTRAVENOUS | Status: AC
  Administered 2022-09-30: 1000 mL via INTRAVENOUS

## 2022-09-30 MED ORDER — SODIUM CHLORIDE 0.9 % IV SOLN
2.0000 g | Freq: Every day | INTRAVENOUS | Status: DC
  Administered 2022-09-30: 2 g via INTRAVENOUS
  Filled 2022-09-30: qty 20

## 2022-09-30 MED ORDER — SODIUM CHLORIDE 0.9 % IV SOLN: INTRAVENOUS | Status: AC

## 2022-09-30 MED ORDER — WARFARIN SODIUM 5 MG PO TABS
5.0000 mg | ORAL_TABLET | Freq: Once | ORAL | Status: AC
  Administered 2022-09-30: 5 mg via ORAL
  Filled 2022-09-30: qty 1

## 2022-09-30 MED ORDER — TRAMADOL HCL 50 MG PO TABS
50.0000 mg | ORAL_TABLET | Freq: Four times a day (QID) | ORAL | Status: DC | PRN
  Administered 2022-10-01 – 2022-10-05 (×6): 50 mg via ORAL
  Filled 2022-09-30 (×6): qty 1

## 2022-09-30 MED ORDER — SODIUM CHLORIDE 0.9 % IV BOLUS
500.0000 mL | Freq: Once | INTRAVENOUS | Status: AC
  Administered 2022-10-01: 500 mL via INTRAVENOUS

## 2022-09-30 MED ORDER — CARVEDILOL 3.125 MG PO TABS: 3.1250 mg | ORAL_TABLET | Freq: Two times a day (BID) | ORAL | Status: DC

## 2022-09-30 MED ORDER — OXYCODONE HCL 5 MG PO TABS
10.0000 mg | ORAL_TABLET | ORAL | Status: DC | PRN
  Administered 2022-09-30 – 2022-10-01 (×3): 10 mg via ORAL
  Filled 2022-09-30 (×3): qty 2

## 2022-09-30 MED ORDER — ACETAMINOPHEN 325 MG PO TABS
650.0000 mg | ORAL_TABLET | Freq: Four times a day (QID) | ORAL | Status: AC | PRN
  Administered 2022-09-30: 650 mg via ORAL

## 2022-09-30 NOTE — Progress Notes (Signed)
[facility] PROGRESS NOTE    Progress Note  [name]  NWG:[id number] DOB: 1940/05/21 DOA: 09/21/2022 PCP: Shirline Frees, NP     Brief Narrative:   [name] is an 82 y.o. male past medical history of BPH, essential hypertension presents to the ED with marked exertional dyspnea and near syncope, 2D echo showed right heart strain was found to have a submassive PE admitted to the ICU, 2D echo showed right ventricular failure, IR consulted and performed direct thrombolysis, after 5 hours started developing gross hematuria, urology was consulted.  Foley catheter was placed with CBI initially.  IV heparin was stopped, IR was consulted as hematuria continue to worsen IVC filter was placed on 09/25/2022.   Assessment/Plan:   Acute respiratory failure with hypoxia secondary to submassive Pulmonary emboli (HCC) Heparin restarted on 09/26/2022 without bolus. Continue IV heparin as Gregory continues to have mild hematuria. Follow-up with interventional radiology as an outpatient to remove IVC filter. Out of bed to chair ad lib.  Gross hematuria: Secondary to thrombolysis. Urology was consulted Foley was placed with bladder irrigation. Continue Foley for at least a week, continue Flomax and follow-up with them as an outpatient. Will probably go home with a Foley voiding trial as an outpatient. PT evaluated the patient, will need home health PT. Gregory continues to have gross hematuria but his hemoglobin is stable creatinine has remained stable. Further management per urology.  Fever: Now with a Tmax of 100.7, recurrent fevers.  Gregory was recently had a Foley catheter placed. Check a UA and a urine culture blood cultures, started empirically Rocephin. Denies any cough shortness of breath or upper or lower respiratory symptoms. Gout and pseudogout could also cause mild fevers.  Right knee pain: Swelling above the knee Gregory relates no trauma, get a chest x-ray. Differential diagnosis  gout gout pseudogout and bursitis.  Acute blood loss anemia: Secondary to gross hematuria on admission hemoglobin was 14 down to 8 transfuse 1 unit of packed red blood cells. Hemoglobin this morning is 8.7.  Paroxysmal atrial fibrillation: Continue Coreg for rate control.  Chronic pain/fibromyalgia: Continue Voltaren gel.  History of BPH: Continue Flomax.  Chronic pain/fibromyalgia: Continue oxycodone. Discontinue fentanyl  Essential hypertension: Continue Coreg, lisinopril held due to massive PE. Blood pressure is well-controlled lisinopril can be restarted as an outpatient.  Hyperlipidemia: Continue statins.   DVT prophylaxis: heparin Family Communication:none Status is: Inpatient Remains inpatient appropriate because: Gross hematuria in the setting of IV heparin.  Transferred to telemetry.    Code Status:     Code Status Orders  (From admission, onward)           Start     Ordered   09/21/22 1232  Full code  Continuous       Question:  By:  Answer:  Consent: discussion documented in EHR   09/21/22 1233           Code Status History     This patient has a current code status but no historical code status.         IV Access:   Peripheral IV   Procedures and diagnostic studies:   No results found.   Medical Consultants:   None.   Subjective:    [name] relates Gregory started having right knee pain yesterday evening this morning is swollen.  Objective:    Vitals:   09/29/22 2124 09/30/22 0200 09/30/22 0633 09/30/22 0919  BP: 98/66 (!) 89/63 105/72 97/61  Pulse:  72 71   Resp:   18   Temp:  99.3 F (37.4 C) 99 F (37.2 C) 100.3 F (37.9 C)  TempSrc:  Oral Oral Oral  SpO2:  90% 90% 95%  Weight:      Height:       SpO2: 95 % O2 Flow Rate (L/min): 2 L/min   Intake/Output Summary (Last 24 hours) at 09/30/2022 0942 Last data filed at 09/30/2022 0500 Gross per 24 hour  Intake --  Output 1150 ml  Net -1150 ml    Filed Weights   09/26/22 0716 09/27/22 0351 09/28/22 0300  Weight: 80.7 kg 79.8 kg 76.8 kg    Exam: General exam: In no acute distress. Respiratory system: Good air movement and clear to auscultation. Cardiovascular system: S1 & S2 heard, RRR. No JVD. Gastrointestinal system: Abdomen is nondistended, soft and nontender.  Extremities: Swelling of the right knee . Skin: No rashes, lesions or ulcers Psychiatry: Judgement and insight appear normal. Mood & affect appropriate.  Data Reviewed:    Labs: Basic Metabolic Panel: Recent Labs  Lab 09/24/22 1812 09/26/22 0305 09/28/22 0254  NA 130* 135 133*  K 4.5 4.8 4.0  CL 98 102 99  CO2 24 27 26   GLUCOSE 120* 109* 96  BUN 19 14 13   CREATININE 1.27* 1.17 0.94  CALCIUM 7.7* 8.0* 8.0*  MG 2.2  --   --    GFR Estimated Creatinine Clearance: 67 mL/min (by C-G formula based on SCr of 0.94 mg/dL). Liver Function Tests: Recent Labs  Lab 09/28/22 0254  AST 61*  ALT 57*  ALKPHOS 136*  BILITOT 0.9  PROT 4.9*  ALBUMIN 2.6*   No results for input(s): "LIPASE", "AMYLASE" in the last 168 hours.  No results for input(s): "AMMONIA" in the last 168 hours. Coagulation profile Recent Labs  Lab 09/29/22 0547 09/30/22 0515  INR 1.2 1.3*    COVID-19 Labs  No results for input(s): "DDIMER", "FERRITIN", "LDH", "CRP" in the last 72 hours.  Lab Results  Component Value Date   SARSCOV2NAA NEGATIVE 02/17/2020   SARSCOV2NAA NEGATIVE 09/14/2019    CBC: Recent Labs  Lab 09/26/22 0305 09/27/22 0232 09/28/22 0254 09/29/22 0547 09/30/22 0515  WBC 6.9 7.1 7.7 8.3 9.7  HGB 8.0* 8.0* 8.1* 8.6* 8.7*  HCT 24.4* 24.5* 24.2* 26.9* 26.9*  MCV 98.4 98.8 97.6 97.8 98.2  PLT 233 263 321 390 433*   Cardiac Enzymes: No results for input(s): "CKTOTAL", "CKMB", "CKMBINDEX", "TROPONINI" in the last 168 hours. BNP (last 3 results) No results for input(s): "PROBNP" in the last 8760 hours. CBG: Recent Labs  Lab 09/24/22 2324  GLUCAP 93    D-Dimer: No results for input(s): "DDIMER" in the last 72 hours. Hgb A1c: No results for input(s): "HGBA1C" in the last 72 hours. Lipid Profile: No results for input(s): "CHOL", "HDL", "LDLCALC", "TRIG", "CHOLHDL", "LDLDIRECT" in the last 72 hours. Thyroid function studies: No results for input(s): "TSH", "T4TOTAL", "T3FREE", "THYROIDAB" in the last 72 hours.  Invalid input(s): "FREET3" Anemia work up: No results for input(s): "VITAMINB12", "FOLATE", "FERRITIN", "TIBC", "IRON", "RETICCTPCT" in the last 72 hours. Sepsis Labs: Recent Labs  Lab 09/27/22 0232 09/28/22 0254 09/29/22 0547 09/30/22 0515  WBC 7.1 7.7 8.3 9.7   Microbiology Recent Results (from the past 240 hour(s))  Culture, blood (routine x 2)     Status: None   Collection Time: 09/21/22  8:49 AM   Specimen: Left Antecubital; Blood  Result Value Ref Range Status   Specimen Description  Final    LEFT ANTECUBITAL Performed at [facility], [address] [place], [place] [address]    Special Requests   Final    BOTTLES DRAWN AEROBIC AND ANAEROBIC Blood Culture adequate volume Performed at [facility], [address] [place], [place] [address]    Culture   Final    NO GROWTH 5 DAYS Performed at [facility], [address], [place], [place] [address]    Report Status 09/26/2022 FINAL  Final  Culture, blood (routine x 2)     Status: None   Collection Time: 09/21/22  9:00 AM   Specimen: Right Antecubital; Blood  Result Value Ref Range Status   Specimen Description   Final    RIGHT ANTECUBITAL Performed at [facility], [address], [place], [place] [address]    Special Requests   Final    BOTTLES DRAWN AEROBIC AND ANAEROBIC Blood Culture adequate volume Performed at [facility], [address] [place], [place] [address]    Culture   Final    NO GROWTH 5 DAYS Performed at [facility], [address]  [place], [place] [address]    Report Status 09/26/2022 FINAL  Final  MRSA Next Gen by PCR, Nasal     Status: None   Collection Time: 09/21/22  2:35 PM   Specimen: Nasal Swab  Result Value Ref Range Status   MRSA by PCR Next Gen NOT DETECTED NOT DETECTED Final    Comment: (NOTE) The GeneXpert MRSA Assay (FDA approved for NASAL specimens only), is one component of a comprehensive MRSA colonization surveillance program. It is not intended to diagnose MRSA infection nor to guide or monitor treatment for MRSA infections. Test performance is not FDA approved in patients less than 20 years old. Performed at [facility], [address] [place], [place] [address]      Medications:    buPROPion  150 mg Oral Daily   carvedilol  3.125 mg Oral BID   Chlorhexidine Gluconate Cloth  6 each Topical Daily   diclofenac Sodium  4 g Topical QID   oxybutynin  5 mg Oral TID   sodium chloride flush  3 mL Intravenous Q12H   tamsulosin  0.4 mg Oral Daily   warfarin  5 mg Oral ONCE-1600   Warfarin - Pharmacist Dosing Inpatient   Does not apply q1600   Continuous Infusions:  sodium chloride     sodium chloride Stopped (09/22/22 0853)   sodium chloride Stopped (09/22/22 0853)   heparin 1,450 Units/hr (09/29/22 2017)   promethazine (PHENERGAN) injection (IM or IVPB)        LOS: 9 days   Marinda Elk  [facility]  09/30/2022, 9:42 AM

## 2022-09-30 NOTE — Progress Notes (Signed)
   09/30/22 2103  Assess: MEWS Score  Temp (!) 101 F (38.3 C)  BP 96/64  MAP (mmHg) 75  Pulse Rate 77  Resp 18  SpO2 95 %  O2 Device Nasal Cannula  Assess: MEWS Score  MEWS Temp 1  MEWS Systolic 1  MEWS Pulse 0  MEWS RR 0  MEWS LOC 0  MEWS Score 2  MEWS Score Color Yellow  Assess: if the MEWS score is Yellow or Red  Were vital signs accurate and taken at a resting state? Yes  Does the patient meet 2 or more of the SIRS criteria? No  MEWS guidelines implemented  Yes, yellow  Treat  MEWS Interventions Considered administering scheduled or prn medications/treatments as ordered  Take Vital Signs  Increase Vital Sign Frequency  Yellow: Q2hr x1, continue Q4hrs until patient remains green for 12hrs  Escalate  MEWS: Escalate Yellow: Discuss with charge nurse and consider notifying provider and/or RRT  Notify: Charge Nurse/RN  Name of Charge Nurse/RN Notified Renita RN  Provider Notification  Provider Name/Title A. Virgel Manifold NP  Date Provider Notified 09/30/22  Time Provider Notified 2110  Method of Notification Page  Notification Reason Change in status (yellow MEWS fever)  Provider response See new orders  Assess: SIRS CRITERIA  SIRS Temperature  1  SIRS Pulse 0  SIRS Respirations  0  SIRS WBC 0  SIRS Score Sum  1   New yellow MEWS score due to fever 101. Also notified by CM of run of SVT to 160s; HR now back to 70s NSR. Received order for tylenol PRN fever and labs to be drawn at midnight or sooner if SVT occurs again. Continue to monitor per yellow MEWS protocol. Mick Sell RN

## 2022-09-30 NOTE — TOC Progression Note (Addendum)
Transition of Care [facility]) - Progression Note    Patient Details  Name: [name] MRN: [id number] Date of Birth: November 22, 1940  Transition [facility]) CM/SW Contact  Beckie Busing, RN Phone [phone]  09/30/2022, 2:57 PM  Clinical Narrative:    TOC continues to follow patient transferred to [facility] from ICU/stepdown. CM at bedside with patient and wife. CM offered choice for [facility] referral. Wife / patient agreeable to [facility] but have no preference for [facility] agency. Referral has been accepted by [facility] with Frances Furbish. AVS has been updated. No other needs noted at this time.     Barriers to Discharge: Continued Medical Work up  Expected Discharge Plan and Services                                               Social Determinants of Health (SDOH) Interventions SDOH Screenings   Food Insecurity: No Food Insecurity (09/22/2022)  Housing: Low Risk  (09/22/2022)  Transportation Needs: No Transportation Needs (09/22/2022)  Utilities: Not At Risk (09/22/2022)  Alcohol Screen: Low Risk  (05/06/2021)  Depression (PHQ2-9): Low Risk  (05/10/2022)  Financial Resource Strain: Low Risk  (05/06/2021)  Physical Activity: Insufficiently Active (05/06/2021)  Social Connections: Moderately Isolated (05/06/2021)  Stress: No Stress Concern Present (05/06/2021)  Tobacco Use: Low Risk  (09/24/2022)    Readmission Risk Interventions    09/30/2022    2:55 PM 09/23/2022    5:29 PM  Readmission Risk Prevention Plan  Post Dischage Appt  Complete  Medication Screening  Complete  Transportation Screening Complete Complete  PCP or Specialist Appt within 5-7 Days Complete   Home Care Screening Complete   Medication Review (RN CM) Referral to Pharmacy

## 2022-09-30 NOTE — Progress Notes (Signed)
Physical Therapy Treatment Patient Details Name: [name] MRN: [id number] DOB: [birth year] Today's Date: 09/30/2022   History of Present Illness patient is a 82 year old  male who presented secondary to marked exertional dyspnea with feeling that he was going to pass out.  On presentation, he was found to have evidence of a submassive pulmonary embolism.  PCCM was consulted and patient admitted to the ICU.  IR consult for the catheter directed thrombolysis for which she tolerated about 5 hours of treatment prior to development of gross hematuria.  Patient transition to heparin IV and urology consulted for management of gross hematuria.  Foley catheter placed with CBI initiated for management.  Echocardiogram confirmed right heart strain. Patient with continued bleeding on heparin IV, with significant blood loss anemia requiring cessation of heparin and consult to IR for an IVC filter, which was successfully placed on 8/18.PMH: BPH, hypertension, hyperlipidemia.    PT Comments   Pt admitted with above diagnosis.  Pt currently with functional limitations due to the deficits listed below (see PT Problem List). PT arrived and pt in bed. Pt reporting new onset of R knee pain 8/10 that started last night no known MOI, pt R knee presents with edema and calor. Imaging negative for acute findings. PT inquired if pt could have pain medication from nursing staff. Nurse indicated Bp has been soft and concerned per hypotension if medications provided. Nurse reported pt had elevated tempeture earlier today.  Bp assessed, please see below. Pt required min A for supine to sit. Maintained static sitting EOB unsupported with S for ~ 7 mins. Due to Bp findings PT and nursing staff deem unsafe to progress with standing and transfer tasks. Pt returned to bed with CGA and required A for repositioning in bed. All needs in place and nurse tech present. Pt will benefit from acute skilled PT to increase their independence and  safety with mobility to allow discharge.   Bp at rest prior to mobility semi reclined in bed 101/60 (63 PR) O2 93% on 2 L/min  Bp with initial sitting EOB 82/62 (78 PR) 95% on 2 L/min with c/o SOB with use of accessory mm Bp ~ 6 min seated EOB 98/69 (112 PR) 97% on 3 L/min with nurse increasing flow rate  Bp once returned to bed 97/67 (76 PR) 99% on 3 L/min    If plan is discharge home, recommend the following: A little help with walking and/or transfers;A little help with bathing/dressing/bathroom;Assistance with cooking/housework;Help with stairs or ramp for entrance;Assist for transportation   Can travel by private vehicle        Equipment Recommendations  Rolling walker (2 wheels)    Recommendations for Other Services       Precautions / Restrictions Precautions Precautions: Other (comment);Fall Precaution Comments: 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; multiple lines, on O2, Restrictions Weight Bearing Restrictions: No     Mobility  Bed Mobility Overal bed mobility: Needs Assistance Bed Mobility: Supine to Sit, Sit to Supine     Supine to sit: Min assist Sit to supine: Contact guard assist   General bed mobility comments: cues to progress and min A LEs off bed, incr time; mild dizziness with sitting initially    Transfers                   General transfer comment: NT due to hypotension    Ambulation/Gait  General Gait Details: NT due to hypotension   Stairs             Wheelchair Mobility     Tilt Bed    Modified Rankin (Stroke Patients Only)       Balance Overall balance assessment: Needs assistance Sitting-balance support: Feet supported, Single extremity supported, No upper extremity supported Sitting balance-Leahy Scale: Fair Sitting balance - Comments: sat EOB x 7 minutes with close supervision                                    Cognition Arousal: Alert Behavior During  Therapy: WFL for tasks assessed/performed Overall Cognitive Status: Within Functional Limits for tasks assessed                                          Exercises      General Comments        Pertinent Vitals/Pain Pain Assessment Pain Assessment: 0-10 Pain Score: 8  Pain Location: chest with deep breathing and R knee Pain Descriptors / Indicators: Sore, Discomfort, Constant Pain Intervention(s): Limited activity within patient's tolerance, Monitored during session, Patient requesting pain meds-RN notified    Home Living Family/patient expects to be discharged to:: Private residence Living Arrangements: Spouse/significant other;Children Available Help at Discharge: Family;Available PRN/intermittently Type of Home: House Home Access: Stairs to enter   [employer] of Steps: 2 small   Home Layout: One level Home Equipment: None Additional Comments: played tennis for years, now teaches; rides stationary bike routinely    Prior Function            PT Goals (current goals can now be found in the care plan section) Acute Rehab PT Goals PT Goal Formulation: With patient Time For Goal Achievement: 10/10/22 Potential to Achieve Goals: Good Progress towards PT goals: PT to reassess next treatment (due to change in medical status)    Frequency    Min 1X/week      PT Plan      Co-evaluation PT/OT/SLP Co-Evaluation/Treatment: Yes Reason for Co-Treatment: To address functional/ADL transfers PT goals addressed during session: Mobility/safety with mobility OT goals addressed during session: ADL's and self-care      AM-PAC PT "6 Clicks" Mobility   Outcome Measure  Help needed turning from your back to your side while in a flat bed without using bedrails?: A Little Help needed moving from lying on your back to sitting on the side of a flat bed without using bedrails?: A Little Help needed moving to and from a bed to a chair (including a  wheelchair)?: A Little Help needed standing up from a chair using your arms (e.g., wheelchair or bedside chair)?: A Little Help needed to walk in hospital room?: Total Help needed climbing 3-5 steps with a railing? : Total 6 Click Score: 14    End of Session   Activity Tolerance: No increased pain;Patient limited by fatigue;Treatment limited secondary to medical complications (Comment) (hypotension) Patient left: with call bell/phone within reach;with bed alarm set;in bed Nurse Communication: Mobility status;Other (comment) (Bp findings) PT Visit Diagnosis: Other abnormalities of gait and mobility (R26.89);Difficulty in walking, not elsewhere classified (R26.2)     Time: 7829-5621 PT Time Calculation (min) (ACUTE ONLY): 24 min  Charges:    $Therapeutic Activity: 23-37 mins PT General Charges $$ ACUTE PT  VISIT: 1 Visit                     Johnny Bridge, PT Acute Rehab    Jacqualyn Posey 09/30/2022, 2:18 PM

## 2022-09-30 NOTE — [place] Notes (Signed)
ANTICOAGULATION CONSULT NOTE - Follow Up Consult  Pharmacy Consult for heparin and warfarin  Indication: acute pulmonary embolus and DVT  Allergies  Allergen Reactions   Other Other (See Comments)    Mangos - caused a severe rash   Prozac [Fluoxetine Hcl]     Made him feel "loopy"   Sulfa Antibiotics Hives   Vicodin [Hydrocodone-Acetaminophen] Itching    Tolerates oxycodone   Erythromycin Rash    Many years ago an oral antibiotic caused a rash(wife and patient think it was erythromycin but they are not positive).    Patient Measurements: Height: 6' (182.9 cm) Weight: 76.8 kg (169 lb 5 oz) IBW/kg (Calculated) : 77.6 Heparin Dosing Weight: 77 kg  Vital Signs: Temp: 99 F (37.2 C) (08/23 0633) Temp Source: Oral (08/23 1610) BP: 105/72 (08/23 9604) Pulse Rate: 71 (08/23 0633)  Labs: Recent Labs    09/28/22 0254 09/29/22 0547 09/30/22 0515  HGB 8.1* 8.6* 8.7*  HCT 24.2* 26.9* 26.9*  PLT 321 390 433*  LABPROT  --  15.3*  --   INR  --  1.2  --   HEPARINUNFRC 0.48 0.56 0.32  CREATININE 0.94  --   --     Estimated Creatinine Clearance: 67 mL/min (by C-G formula based on SCr of 0.94 mg/dL).  Assessment: Patient is an 82 y.o M with recent COVID infection who presented to the ED on 09/21/22 with c/o abdominal pain, HA, SOB and dizziness. He was subsequently found to have acute bilateral PE with RHS and RLE DVT. Catheter directed tPA initiated on 8/14, but stopped after 4 hours due to bleeding. IVC filter placed on 09/25/22.  He's currently on warfarin with  heparin drip for bridging for acute VTE treatment.   Significant events:  - 8/14 chest CT: acute BL PE with RHS. Ureteral stones. - 8/14 LE doppler: acute RLE  - 8/14: B catheter directed thrombolysis. RN reported bleeding bleeding from penis, surgical site on his face, and hematuria. CCM consulted, stopped tpa @ 2128 (only got tPA for 4 hrs) - 8/15: heparin resumed with NO BOLUS - 8/18: heparin d/c'd due to bleeding and  IVC filter placed. - 8/19: heparin resumed given worsening PE sx's (chest tightness, SOB) - 8/21: started on warfarin  Today, 09/30/2022: - day #2 of at least 5 days of heparin/warfarin overlap - heparin level is therapeutic at 0.32 - cbc stable  - INR is sub-therapeutic at 1.3 - pt's having intermittent hematuria. Urine documented this morning as red. Currently off CBI - no significant drug-drug intxns noted   Goal of Therapy:  Heparin level 0.3-0.7 units/ml Monitor platelets by anticoagulation protocol: Yes   Plan:  - continue heparin drip at 1450 units/hr - warfarin 5 mg PO x1 - daily heparin level , INR and cbc - monitor for severity of hematuria  [name] P 09/30/2022,6:36 AM

## 2022-10-01 ENCOUNTER — Inpatient Hospital Stay (HOSPITAL_COMMUNITY): Payer: Medicare Other

## 2022-10-01 DIAGNOSIS — N179 Acute kidney failure, unspecified: Secondary | ICD-10-CM | POA: Diagnosis not present

## 2022-10-01 DIAGNOSIS — A419 Sepsis, unspecified organism: Secondary | ICD-10-CM | POA: Diagnosis not present

## 2022-10-01 DIAGNOSIS — I2609 Other pulmonary embolism with acute cor pulmonale: Secondary | ICD-10-CM | POA: Diagnosis not present

## 2022-10-01 DIAGNOSIS — R6520 Severe sepsis without septic shock: Secondary | ICD-10-CM

## 2022-10-01 DIAGNOSIS — J9601 Acute respiratory failure with hypoxia: Secondary | ICD-10-CM | POA: Diagnosis not present

## 2022-10-01 LAB — DIFFERENTIAL
Abs Immature Granulocytes: 0.03 10*3/uL (ref 0.00–0.07)
Basophils Absolute: 0 10*3/uL (ref 0.0–0.1)
Basophils Relative: 0 %
Eosinophils Absolute: 0.2 10*3/uL (ref 0.0–0.5)
Eosinophils Relative: 2 %
Immature Granulocytes: 0 %
Lymphocytes Relative: 14 %
Lymphs Abs: 1.2 10*3/uL (ref 0.7–4.0)
Monocytes Absolute: 1.4 10*3/uL — ABNORMAL HIGH (ref 0.1–1.0)
Monocytes Relative: 16 %
Neutro Abs: 6.1 10*3/uL (ref 1.7–7.7)
Neutrophils Relative %: 68 %

## 2022-10-01 LAB — RESPIRATORY PANEL BY PCR

## 2022-10-01 LAB — CBC
HCT: 25.6 % — ABNORMAL LOW (ref 39.0–52.0)
Hemoglobin: 8.2 g/dL — ABNORMAL LOW (ref 13.0–17.0)
MCH: 31.8 pg (ref 26.0–34.0)
MCHC: 32 g/dL (ref 30.0–36.0)
MCV: 99.2 fL (ref 80.0–100.0)
Platelets: 418 10*3/uL — ABNORMAL HIGH (ref 150–400)
RBC: 2.58 MIL/uL — ABNORMAL LOW (ref 4.22–5.81)
RDW: 13.7 % (ref 11.5–15.5)
WBC: 10.5 10*3/uL (ref 4.0–10.5)
nRBC: 0 % (ref 0.0–0.2)

## 2022-10-01 LAB — BASIC METABOLIC PANEL
Anion gap: 5 (ref 5–15)
BUN: 26 mg/dL — ABNORMAL HIGH (ref 8–23)
CO2: 22 mmol/L (ref 22–32)
Calcium: 7.6 mg/dL — ABNORMAL LOW (ref 8.9–10.3)
Chloride: 103 mmol/L (ref 98–111)
Creatinine, Ser: 1.41 mg/dL — ABNORMAL HIGH (ref 0.61–1.24)
GFR, Estimated: 50 mL/min — ABNORMAL LOW (ref >60)
Glucose, Bld: 103 mg/dL — ABNORMAL HIGH (ref 70–99)
Potassium: 4.5 mmol/L (ref 3.5–5.1)
Sodium: 130 mmol/L — ABNORMAL LOW (ref 135–145)

## 2022-10-01 LAB — HEPARIN LEVEL (UNFRACTIONATED): Heparin Unfractionated: 0.51 [IU]/mL (ref 0.30–0.70)

## 2022-10-01 LAB — PROTIME-INR
INR: 1.6 — ABNORMAL HIGH (ref 0.8–1.2)
Prothrombin Time: 19.7 s — ABNORMAL HIGH (ref 11.4–15.2)

## 2022-10-01 LAB — LACTIC ACID, PLASMA: Lactic Acid, Venous: 0.7 mmol/L (ref 0.5–1.9)

## 2022-10-01 LAB — URINE CULTURE: Culture: NO GROWTH

## 2022-10-01 MED ORDER — IOHEXOL 9 MG/ML PO SOLN
500.0000 mL | ORAL | Status: AC
  Administered 2022-10-01 (×2): 500 mL via ORAL

## 2022-10-01 MED ORDER — MIDODRINE HCL 5 MG PO TABS: 5.0000 mg | ORAL_TABLET | ORAL | Status: DC

## 2022-10-01 MED ORDER — SODIUM CHLORIDE 0.9 % IV BOLUS
500.0000 mL | Freq: Once | INTRAVENOUS | Status: AC
  Administered 2022-10-01: 500 mL via INTRAVENOUS

## 2022-10-01 MED ORDER — ACETAMINOPHEN 325 MG PO TABS
ORAL_TABLET | ORAL | Status: AC
  Filled 2022-10-01: qty 2

## 2022-10-01 MED ORDER — SODIUM CHLORIDE 0.9 % IV SOLN
2.0000 g | Freq: Two times a day (BID) | INTRAVENOUS | Status: DC
  Administered 2022-10-01 – 2022-10-02 (×4): 2 g via INTRAVENOUS
  Filled 2022-10-01 (×4): qty 12.5

## 2022-10-01 MED ORDER — FENTANYL CITRATE PF 50 MCG/ML IJ SOSY: 12.5000 ug | PREFILLED_SYRINGE | INTRAMUSCULAR | Status: DC | PRN

## 2022-10-01 MED ORDER — VANCOMYCIN HCL 1500 MG/300ML IV SOLN
1500.0000 mg | Freq: Once | INTRAVENOUS | Status: AC
  Administered 2022-10-01: 1500 mg via INTRAVENOUS
  Filled 2022-10-01: qty 300

## 2022-10-01 MED ORDER — ALBUMIN HUMAN 25 % IV SOLN
50.0000 g | Freq: Once | INTRAVENOUS | Status: AC
  Administered 2022-10-01: 50 g via INTRAVENOUS
  Filled 2022-10-01: qty 200

## 2022-10-01 MED ORDER — VANCOMYCIN HCL 1250 MG/250ML IV SOLN
1250.0000 mg | INTRAVENOUS | Status: DC
  Administered 2022-10-02: 1250 mg via INTRAVENOUS
  Filled 2022-10-01 (×2): qty 250

## 2022-10-01 MED ORDER — MIDODRINE HCL 5 MG PO TABS
10.0000 mg | ORAL_TABLET | Freq: Three times a day (TID) | ORAL | Status: DC
  Administered 2022-10-01 – 2022-10-02 (×6): 10 mg via ORAL
  Filled 2022-10-01 (×6): qty 2

## 2022-10-01 MED ORDER — POLYETHYLENE GLYCOL 3350 17 G PO PACK
17.0000 g | PACK | Freq: Two times a day (BID) | ORAL | Status: AC
  Administered 2022-10-01 – 2022-10-02 (×4): 17 g via ORAL
  Filled 2022-10-01 (×3): qty 1

## 2022-10-01 MED ORDER — ALBUMIN HUMAN 25 % IV SOLN
50.0000 g | Freq: Once | INTRAVENOUS | Status: AC
  Administered 2022-10-01: 50 g via INTRAVENOUS
  Filled 2022-10-01 (×2): qty 200

## 2022-10-01 MED ORDER — ACETAMINOPHEN 325 MG PO TABS
650.0000 mg | ORAL_TABLET | Freq: Four times a day (QID) | ORAL | Status: DC | PRN
  Administered 2022-10-01: 650 mg via ORAL

## 2022-10-01 MED ORDER — SODIUM CHLORIDE 0.9 % IV SOLN: INTRAVENOUS | Status: DC | PRN

## 2022-10-01 MED ORDER — MIDODRINE HCL 5 MG PO TABS: 5.0000 mg | ORAL_TABLET | Freq: Three times a day (TID) | ORAL | Status: DC

## 2022-10-01 MED ORDER — MIDODRINE HCL 5 MG PO TABS
2.5000 mg | ORAL_TABLET | ORAL | Status: AC
  Administered 2022-10-01: 2.5 mg via ORAL
  Filled 2022-10-01: qty 1

## 2022-10-01 NOTE — [place] Notes (Signed)
  Rapid Response Event Note   Reason for Call : low B/P  Initial Focused Assessment: pt alert and oriented and followings commands.  Lung sounds are decreased bil.  B/P is 85/55.  Pulses intact.   Interventions: pt already receiving a NS bolus of 500 cc iv per [facility], NP orders.  NP at the bedside and wrote additional orders.  Plan of Care: Pt will transfer to ICU/SD unit for closer monitoring.  Event Summary:   MD Notified: yes Call Time:2356 Arrival Time: 2359 End Time: 1:15  Conley Rolls, RN

## 2022-10-01 NOTE — Progress Notes (Signed)
Upon checking pts repeat VS for yellow MEWS protocol, noted that fever had resolved to 98.3 but BP had dropped further to 76/50. Rechecked in opposite arm at 71/49, and manual reading was 82/42. Paged on call NP and rapid response to further assess pt. BP monitored for some time without improvement despite ordered IVF bolus, and decision was made to transfer pt to SDU. Pt called wife and RRRN spoke with her regarding pts condition and upgrade in level of care. Report given to [employer] and pt transferred to 1232. Mick Sell RN

## 2022-10-01 NOTE — Progress Notes (Signed)
Pharmacy Antibiotic Note  [name] is a 82 y.o. male admitted on 09/21/2022 with sepsis.  Pharmacy has been consulted for vanc/cefepime dosing.  Plan: Cefepime 2g IV q12 per current renal function Vancomycin 1500mg  IV x 1 then 1250mg  IV q24 - goal AUC 400-550  Height: 6' (182.9 cm) Weight: 79.4 kg (175 lb 0.7 oz) IBW/kg (Calculated) : 77.6  Temp (24hrs), Avg:99.2 F (37.3 C), Min:98 F (36.7 C), Max:101 F (38.3 C)  Recent Labs  Lab 09/24/22 1812 09/24/22 2156 09/26/22 0305 09/27/22 0232 09/28/22 0254 09/29/22 0547 09/30/22 0515 09/30/22 1055 10/01/22 0015  WBC  --    < > 6.9 7.1 7.7 8.3 9.7  --  10.5  CREATININE 1.27*  --  1.17  --  0.94  --   --  1.40* 1.41*  LATICACIDVEN  --   --   --   --   --   --   --   --  0.7   < > = values in this interval not displayed.    Estimated Creatinine Clearance: 45.1 mL/min (A) (by C-G formula based on SCr of 1.41 mg/dL (H)).    Allergies  Allergen Reactions   Other Other (See Comments)    Mangos - caused a severe rash   Prozac [Fluoxetine Hcl]     Made him feel "loopy"   Sulfa Antibiotics Hives   Vicodin [Hydrocodone-Acetaminophen] Itching    Tolerates oxycodone   Erythromycin Rash    Many years ago an oral antibiotic caused a rash(wife and patient think it was erythromycin but they are not positive).      Thank you for allowing pharmacy to be a part of this patient's care.  Berkley Harvey 10/01/2022 7:54 AM

## 2022-10-01 NOTE — Progress Notes (Addendum)
    Patient Name: [name]           DOB: 1940/07/26  MRN: [id number]      Admission Date: 09/21/2022  Attending Provider: Marinda Elk, MD  Primary Diagnosis: Pulmonary emboli [facility])   Level of care: Stepdown    CROSS COVER NOTE   Date of Service   10/01/2022   [name], 82 y.o. male, was admitted on 09/21/2022 for Pulmonary emboli [facility]).    HPI/Events of Note   Rapid response called-   Hypotensive, BP 70/ 50s (verified) Asymptomatic.  Mentation at baseline.  Other vital signs stable.   During daytime, patient also had soft BP which improved after receiving bolus. Currently on continuous IV fluids.  No reports of renal or GI volume loss.  Has gross hematuria.  Previous hemoglobin 8.7.  Will recheck CBC now. Patient also had fever earlier tonight, empirically on Rocephin.  Check lactic acid. No improvement in BP after receiving 500 cc. Will transfer to SDU and add on another 500 cc bolus and albumin.  During bedside evaluation, patient is alert and oriented.  Does not appear to be in acute distress.   No medical changes reported.  Breath sounds clear.  No JVD.  S1 and S2 heard.  Abdomen soft and nondistended.   Addendum 0530-  BP had improved after IVF and albumin, SBP 90-110.  However, SBP now 70-80s with MAP <60.   Asymptomatic.  Lactic acid negative.  H&H stable. He did receive oxycodone for chronic pain which could also be exacerbating hypotension. Last coreg dose was on 8/23. Hold of on pain meds, sedating agents, antihypertensive meds.  Placing orders for additional fluid bolus, IV albumin, and oral midodrine.    Interventions/ Plan   Labs-CBC, BMP, magnesium, phosphorus Lactic acid- negative Fluid bolus, 2 L total IV albumin x2 Transfer patient to SDU for more frequent BP management. Midodrine, 7.5 mg Low dose fentanyl or tylenol for severe pain for now due to hypotension         Anthoney Harada, DNP, ACNPC- AG [facility]

## 2022-10-01 NOTE — Progress Notes (Addendum)
[facility] PROGRESS NOTE    Progress Note  [name]  ZOX:[id number] DOB: February 03, 1941 DOA: 09/21/2022 PCP: Shirline Frees, NP     Brief Narrative:   [name] is an 82 y.o. male past medical history of BPH, essential hypertension presents to the ED with marked exertional dyspnea and near syncope, 2D echo showed right heart strain was found to have a submassive PE admitted to the ICU, 2D echo showed right ventricular failure, IR consulted and performed direct thrombolysis, after 5 hours started developing gross hematuria, urology was consulted.  Foley catheter was placed with CBI initially.  IV heparin was stopped, IR was consulted as hematuria continue to worsen IVC filter was placed on 09/25/2022.   Assessment/Plan:   Acute respiratory failure with hypoxia secondary to submassive Pulmonary emboli (HCC) Holding heparin. Follow-up with interventional radiology to remove IVC filter. PT OT to continue to work with them, will need home health PT.  Gross hematuria: Secondary to thrombolysis. Urology was consulted, Continue Foley and discharge with Foley as an outpatient, continue Flomax and follow-up with them as an outpatient. He continues to have gross hematuria but his hemoglobin is stable creatinine slightly up likely due to severe sepsis.   Further management per urology.  Severe sepsis of unclear source: T max of 101, recurrent fevers and rising WBC.  He was recently had a Foley catheter placed. Transferred to stepdown due to hypotension was given him albumin and started on midodrine we will continue this treatment. Still mildly hypotensive increase midodrine to 10 3 times daily given bolus normal saline continue IV albumin. Broaden antibiotics to IV Vanco and cefepime. Check respiratory panel, chest x-ray showed no acute findings. Check a CT scan of the abdomen pelvis and chest. Knee x-ray showed no effusion, except for severe osteoarthritic  changes.  Thrombocytosis: Acting as an acute phase reactant.  Right knee pain: X-ray of the knee showed no acute findings or severe osteoarthritic changes.  Acute blood loss anemia: Secondary to gross hematuria on admission hemoglobin was 14 down to 8 transfuse 1 unit of packed red blood cells. Hemoglobin this morning is 8.2. Hold Coumadin and heparin, hemoglobin is drifting down with hypotension. CT scan of the abdomen and pelvis to rule out retroperitoneal bleed hold Coumadin.  Paroxysmal atrial fibrillation: Holding Coreg due to hypotension.  Heart rate is controlled  Chronic pain/fibromyalgia: Hold Voltaren gel.  History of BPH: Continue Flomax.  Chronic pain/fibromyalgia: Continue oxycodone. Discontinue fentanyl  Essential hypertension: Hold Coreg, lisinopril and all other antihypertensive medication.  Hyperlipidemia: Continue statins.   DVT prophylaxis: heparin Family Communication:none Status is: Inpatient Remains inpatient appropriate because: Gross hematuria in the setting of IV heparin.  Transferred to telemetry.    Code Status:     Code Status Orders  (From admission, onward)           Start     Ordered   09/21/22 1232  Full code  Continuous       Question:  By:  Answer:  Consent: discussion documented in EHR   09/21/22 1233           Code Status History     This patient has a current code status but no historical code status.         IV Access:   Peripheral IV   Procedures and diagnostic studies:   DG Chest Port 1 View  Result Date: 10/01/2022 CLINICAL DATA:  Shortness of breath EXAM: PORTABLE CHEST 1 VIEW COMPARISON:  09/26/2022 FINDINGS:  Heart and mediastinal contours within normal limits. No confluent airspace opacities or effusions. No acute bony abnormality. IMPRESSION: No active disease. Electronically Signed   By: Charlett Nose M.D.   On: 10/01/2022 01:40   DG Knee 1-2 Views Right  Result Date: 09/30/2022 CLINICAL  DATA:  Right knee pain EXAM: RIGHT KNEE - 2 VIEW COMPARISON:  X-ray 02/15/2022 FINDINGS: Once again there is severe tricompartmental osteoarthritic changes identified with osteophyte formation. Some joint space loss as well. Several well corticated densities are again identified with chronic erosive changes along the inferior aspect of the patella, multiple joint bodies. No significant joint effusion. IMPRESSION: No acute process. 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 Electronically Signed   By: Karen Kays M.D.   On: 09/30/2022 11:36     Medical Consultants:   None.   Subjective:    [name] no appetite relates he feels rough.  Objective:    Vitals:   10/01/22 0500 10/01/22 0530 10/01/22 0600 10/01/22 0700  BP: (!) 85/49 (!) 74/38 (!) 77/47 (!) 85/57  Pulse: 100 (!) 48 64 68  Resp: 14 15 15 12   Temp:      TempSrc:      SpO2: 97% 97% 97% 97%  Weight:      Height:       SpO2: 97 % O2 Flow Rate (L/min): 4 L/min   Intake/Output Summary (Last 24 hours) at 10/01/2022 0719 Last data filed at 10/01/2022 0650 Gross per 24 hour  Intake 3822.38 ml  Output 575 ml  Net 3247.38 ml   Filed Weights   09/27/22 0351 09/28/22 0300 10/01/22 0103  Weight: 79.8 kg 76.8 kg 79.4 kg    Exam: General exam: In no acute distress. Respiratory system: Good air movement and clear to auscultation. Cardiovascular system: S1 & S2 heard, RRR. No JVD. Gastrointestinal system: Abdomen is nondistended, soft and nontender.  Extremities: No pedal edema. Skin: No rashes, lesions or ulcers Psychiatry: Judgement and insight appear normal. Mood & affect appropriate.  Data Reviewed:    Labs: Basic Metabolic Panel: Recent Labs  Lab 09/24/22 1812 09/26/22 0305 09/28/22 0254 09/30/22 1055 09/30/22 2302 10/01/22 0015  NA 130* 135 133* 129*  --  130*  K 4.5 4.8 4.0 4.6  --  4.5  CL 98 102 99 97*  --  103  CO2 24 27 26 26   --  22  GLUCOSE 120* 109* 96 104*   --  103*  BUN 19 14 13 21   --  26*  CREATININE 1.27* 1.17 0.94 1.40*  --  1.41*  CALCIUM 7.7* 8.0* 8.0* 8.2*  --  7.6*  MG 2.2  --   --   --  2.1  --   PHOS  --   --   --   --  3.2  --    GFR Estimated Creatinine Clearance: 45.1 mL/min (A) (by C-G formula based on SCr of 1.41 mg/dL (H)). Liver Function Tests: Recent Labs  Lab 09/28/22 0254  AST 61*  ALT 57*  ALKPHOS 136*  BILITOT 0.9  PROT 4.9*  ALBUMIN 2.6*   No results for input(s): "LIPASE", "AMYLASE" in the last 168 hours.  No results for input(s): "AMMONIA" in the last 168 hours. Coagulation profile Recent Labs  Lab 09/29/22 0547 09/30/22 0515 10/01/22 0015  INR 1.2 1.3* 1.6*    COVID-19 Labs  No results for input(s): "DDIMER", "FERRITIN", "LDH", "CRP" in the last 72 hours.  Lab Results  Component Value  Date   SARSCOV2NAA NEGATIVE 02/17/2020   SARSCOV2NAA NEGATIVE 09/14/2019    CBC: Recent Labs  Lab 09/27/22 0232 09/28/22 0254 09/29/22 0547 09/30/22 0515 10/01/22 0015  WBC 7.1 7.7 8.3 9.7 10.5  HGB 8.0* 8.1* 8.6* 8.7* 8.2*  HCT 24.5* 24.2* 26.9* 26.9* 25.6*  MCV 98.8 97.6 97.8 98.2 99.2  PLT 263 321 390 433* 418*   Cardiac Enzymes: No results for input(s): "CKTOTAL", "CKMB", "CKMBINDEX", "TROPONINI" in the last 168 hours. BNP (last 3 results) No results for input(s): "PROBNP" in the last 8760 hours. CBG: Recent Labs  Lab 09/24/22 2324  GLUCAP 93   D-Dimer: No results for input(s): "DDIMER" in the last 72 hours. Hgb A1c: No results for input(s): "HGBA1C" in the last 72 hours. Lipid Profile: No results for input(s): "CHOL", "HDL", "LDLCALC", "TRIG", "CHOLHDL", "LDLDIRECT" in the last 72 hours. Thyroid function studies: No results for input(s): "TSH", "T4TOTAL", "T3FREE", "THYROIDAB" in the last 72 hours.  Invalid input(s): "FREET3" Anemia work up: No results for input(s): "VITAMINB12", "FOLATE", "FERRITIN", "TIBC", "IRON", "RETICCTPCT" in the last 72 hours. Sepsis Labs: Recent Labs   Lab 09/28/22 0254 09/29/22 0547 09/30/22 0515 10/01/22 0015  WBC 7.7 8.3 9.7 10.5  LATICACIDVEN  --   --   --  0.7   Microbiology Recent Results (from the past 240 hour(s))  Culture, blood (routine x 2)     Status: None   Collection Time: 09/21/22  8:49 AM   Specimen: Left Antecubital; Blood  Result Value Ref Range Status   Specimen Description   Final    LEFT ANTECUBITAL Performed at [facility], [address], [place], [place] [address]    Special Requests   Final    BOTTLES DRAWN AEROBIC AND ANAEROBIC Blood Culture adequate volume Performed at [facility], [address], [place], [place] [address]    Culture   Final    NO GROWTH 5 DAYS Performed at [facility], [address], [place], [place] [address]    Report Status 09/26/2022 FINAL  Final  Culture, blood (routine x 2)     Status: None   Collection Time: 09/21/22  9:00 AM   Specimen: Right Antecubital; Blood  Result Value Ref Range Status   Specimen Description   Final    RIGHT ANTECUBITAL Performed at [facility], [address] [place], [place] [address]    Special Requests   Final    BOTTLES DRAWN AEROBIC AND ANAEROBIC Blood Culture adequate volume Performed at [facility], [address], [place], [place] [address]    Culture   Final    NO GROWTH 5 DAYS Performed at [facility], [address] [place], [place] [address]    Report Status 09/26/2022 FINAL  Final  MRSA Next Gen by PCR, Nasal     Status: None   Collection Time: 09/21/22  2:35 PM   Specimen: Nasal Swab  Result Value Ref Range Status   MRSA by PCR Next Gen NOT DETECTED NOT DETECTED Final    Comment: (NOTE) The GeneXpert MRSA Assay (FDA approved for NASAL specimens only), is one component of a comprehensive MRSA colonization surveillance program. It is not intended to diagnose MRSA infection nor to guide or monitor treatment for MRSA  infections. Test performance is not FDA approved in patients less than 1 years old. Performed at [facility], [address] [place], [place] [address]      Medications:  buPROPion  150 mg Oral Daily   Chlorhexidine Gluconate Cloth  6 each Topical Daily   midodrine  5 mg Oral TID WC   oxybutynin  5 mg Oral TID   tamsulosin  0.4 mg Oral Daily   Warfarin - Pharmacist Dosing Inpatient   Does not apply q1600   Continuous Infusions:  sodium chloride Stopped (09/22/22 0853)   sodium chloride Stopped (09/22/22 0853)   albumin human     cefTRIAXone (ROCEPHIN)  IV 2 g (09/30/22 1105)   heparin 1,450 Units/hr (10/01/22 0650)   promethazine (PHENERGAN) injection (IM or IVPB)     sodium chloride        LOS: 10 days   Marinda Elk  [facility]  10/01/2022, 7:19 AM

## 2022-10-02 DIAGNOSIS — J159 Unspecified bacterial pneumonia: Secondary | ICD-10-CM

## 2022-10-02 DIAGNOSIS — N179 Acute kidney failure, unspecified: Secondary | ICD-10-CM | POA: Diagnosis not present

## 2022-10-02 DIAGNOSIS — J9601 Acute respiratory failure with hypoxia: Secondary | ICD-10-CM | POA: Diagnosis not present

## 2022-10-02 DIAGNOSIS — J189 Pneumonia, unspecified organism: Secondary | ICD-10-CM | POA: Insufficient documentation

## 2022-10-02 DIAGNOSIS — I2609 Other pulmonary embolism with acute cor pulmonale: Secondary | ICD-10-CM | POA: Diagnosis not present

## 2022-10-02 DIAGNOSIS — A419 Sepsis, unspecified organism: Secondary | ICD-10-CM | POA: Diagnosis not present

## 2022-10-02 LAB — PROTIME-INR
INR: 2 — ABNORMAL HIGH (ref 0.8–1.2)
Prothrombin Time: 23 s — ABNORMAL HIGH (ref 11.4–15.2)

## 2022-10-02 LAB — BASIC METABOLIC PANEL
Anion gap: 10 (ref 5–15)
BUN: 24 mg/dL — ABNORMAL HIGH (ref 8–23)
CO2: 17 mmol/L — ABNORMAL LOW (ref 22–32)
Calcium: 8.2 mg/dL — ABNORMAL LOW (ref 8.9–10.3)
Chloride: 106 mmol/L (ref 98–111)
Creatinine, Ser: 1.04 mg/dL (ref 0.61–1.24)
GFR, Estimated: >60 mL/min (ref >60)
Glucose, Bld: 90 mg/dL (ref 70–99)
Potassium: 4.4 mmol/L (ref 3.5–5.1)
Sodium: 133 mmol/L — ABNORMAL LOW (ref 135–145)

## 2022-10-02 LAB — CBC
HCT: 23.6 % — ABNORMAL LOW (ref 39.0–52.0)
Hemoglobin: 7.3 g/dL — ABNORMAL LOW (ref 13.0–17.0)
MCH: 31.6 pg (ref 26.0–34.0)
MCHC: 30.9 g/dL (ref 30.0–36.0)
MCV: 102.2 fL — ABNORMAL HIGH (ref 80.0–100.0)
Platelets: 393 10*3/uL (ref 150–400)
RBC: 2.31 MIL/uL — ABNORMAL LOW (ref 4.22–5.81)
RDW: 14.2 % (ref 11.5–15.5)
WBC: 9.6 10*3/uL (ref 4.0–10.5)
nRBC: 0 % (ref 0.0–0.2)

## 2022-10-02 LAB — PREPARE RBC (CROSSMATCH)

## 2022-10-02 LAB — HEMOGLOBIN AND HEMATOCRIT, BLOOD
HCT: 27.2 % — ABNORMAL LOW (ref 39.0–52.0)
Hemoglobin: 8.7 g/dL — ABNORMAL LOW (ref 13.0–17.0)

## 2022-10-02 MED ORDER — WARFARIN - PHARMACIST DOSING INPATIENT: Freq: Every day | Status: DC

## 2022-10-02 MED ORDER — OXYCODONE HCL 5 MG PO TABS
5.0000 mg | ORAL_TABLET | ORAL | Status: DC | PRN
  Administered 2022-10-02 – 2022-10-05 (×12): 5 mg via ORAL
  Filled 2022-10-02 (×12): qty 1

## 2022-10-02 MED ORDER — SODIUM CHLORIDE 0.9 % IV BOLUS
500.0000 mL | Freq: Once | INTRAVENOUS | Status: AC
  Administered 2022-10-02: 500 mL via INTRAVENOUS

## 2022-10-02 MED ORDER — COLCHICINE 0.3 MG HALF TABLET
0.3000 mg | ORAL_TABLET | Freq: Two times a day (BID) | ORAL | Status: DC
  Administered 2022-10-02 – 2022-10-05 (×6): 0.3 mg via ORAL
  Filled 2022-10-02 (×7): qty 1

## 2022-10-02 MED ORDER — FENTANYL CITRATE PF 50 MCG/ML IJ SOSY
12.5000 ug | PREFILLED_SYRINGE | Freq: Once | INTRAMUSCULAR | Status: AC
  Administered 2022-10-02: 12.5 ug via INTRAVENOUS
  Filled 2022-10-02: qty 1

## 2022-10-02 MED ORDER — SODIUM CHLORIDE 0.9% IV SOLUTION: Freq: Once | INTRAVENOUS | Status: AC

## 2022-10-02 MED ORDER — HYDROCODONE-ACETAMINOPHEN 5-325 MG PO TABS
1.0000 | ORAL_TABLET | ORAL | Status: DC | PRN
  Administered 2022-10-02 (×2): 2 via ORAL
  Filled 2022-10-02 (×2): qty 2

## 2022-10-02 MED ORDER — SODIUM CHLORIDE 0.9 % IV SOLN: INTRAVENOUS | Status: AC

## 2022-10-02 MED ORDER — WARFARIN SODIUM 2.5 MG PO TABS
2.5000 mg | ORAL_TABLET | Freq: Once | ORAL | Status: AC
  Administered 2022-10-02: 2.5 mg via ORAL
  Filled 2022-10-02: qty 1

## 2022-10-02 NOTE — Plan of Care (Signed)
  Problem: Education: Goal: Knowledge of General Education information will improve Description: Including pain rating scale, medication(s)/side effects and non-pharmacologic comfort measures Outcome: Progressing   Problem: Health Behavior/Discharge Planning: Goal: Ability to manage health-related needs will improve Outcome: Progressing   Problem: Clinical Measurements: Goal: Ability to maintain clinical measurements within normal limits will improve Outcome: Progressing Goal: Will remain free from infection Outcome: Progressing Goal: Diagnostic test results will improve Outcome: Progressing Goal: Respiratory complications will improve Outcome: Progressing   Problem: Coping: Goal: Level of anxiety will decrease Outcome: Progressing   Problem: Elimination: Goal: Will not experience complications related to urinary retention Outcome: Progressing   Problem: Safety: Goal: Ability to remain free from injury will improve Outcome: Progressing   Problem: Skin Integrity: Goal: Risk for impaired skin integrity will decrease Outcome: Progressing   Problem: Education: Goal: Understanding of CV disease, CV risk reduction, and recovery process will improve Outcome: Progressing Goal: Individualized Educational Video(s) Outcome: Progressing   Problem: Activity: Goal: Ability to return to baseline activity level will improve Outcome: Progressing   Problem: Cardiovascular: Goal: Ability to achieve and maintain adequate cardiovascular perfusion will improve Outcome: Progressing Goal: Vascular access site(s) Level 0-1 will be maintained Outcome: Progressing   Problem: Health Behavior/Discharge Planning: Goal: Ability to safely manage health-related needs after discharge will improve Outcome: Progressing   Problem: Clinical Measurements: Goal: Cardiovascular complication will be avoided Outcome: Not Progressing   Problem: Activity: Goal: Risk for activity intolerance will  decrease Outcome: Not Progressing   Problem: Nutrition: Goal: Adequate nutrition will be maintained Outcome: Not Progressing   Problem: Elimination: Goal: Will not experience complications related to bowel motility Outcome: Not Progressing   Problem: Pain Managment: Goal: General experience of comfort will improve Outcome: Not Progressing

## 2022-10-02 NOTE — Plan of Care (Signed)
  Problem: Education: Goal: Knowledge of General Education information will improve Description: Including pain rating scale, medication(s)/side effects and non-pharmacologic comfort measures Outcome: Progressing   Problem: Clinical Measurements: Goal: Diagnostic test results will improve Outcome: Progressing Goal: Respiratory complications will improve Outcome: Progressing Goal: Cardiovascular complication will be avoided Outcome: Progressing   Problem: Activity: Goal: Risk for activity intolerance will decrease Outcome: Progressing   

## 2022-10-02 NOTE — Progress Notes (Signed)
[facility] PROGRESS NOTE    Progress Note  DESAI APA  WUJ:[id number] DOB: 01-Mar-1940 DOA: 09/21/2022 PCP: Shirline Frees, NP     Brief Narrative:   [name] is an 82 y.o. male past medical history of BPH, essential hypertension presents to the ED with marked exertional dyspnea and near syncope, 2D echo showed right heart strain was found to have a submassive PE admitted to the ICU, 2D echo showed right ventricular failure, IR consulted and performed direct thrombolysis, after 5 hours started developing gross hematuria, urology was consulted.  Foley catheter was placed with CBI initially.  IV heparin was stopped, IR was consulted as hematuria continue to worsen IVC filter was placed on 09/25/2022.   Assessment/Plan:   Acute respiratory failure with hypoxia secondary to submassive Pulmonary emboli (HCC) Continue to hold heparin, INR is 2.0. Follow-up with interventional radiology to remove IVC filter. PT OT to continue to work with them, will need home health PT.  Gross hematuria: Secondary to thrombolysis. Urology was consulted, Continue Foley and discharge with Foley as an outpatient, continue Flomax and follow-up with them as an outpatient. Hemoglobin is low this morning.  See below for the details. Hematuria has now resolved. Further management per urology.  Severe sepsis possibly due to healthcare associated pneumonia Tmax of 101.9 WBC has stabilized and is slowly coming down.  He was recently had a Foley catheter placed. Hypotension has resolved acutely on IV midodrine. Continue IV Vanco and cefepime. Culture data is negative till date. CT of the chest showed new left upper lobe infiltrate  Acute kidney injury: Likely due to sepsis was started on IV fluids and midodrine. Basic metabolic panels pending this morning.  Thrombocytosis: Now resolving.  Right knee pain: Continues to have severe pain we will get a CT of the knee.  Acute blood loss  anemia: Secondary to gross hematuria on admission hemoglobin was 14 down to 8 transfuse 1 unit of packed red blood cells. Heme globin this morning is 7.3 will transfuse 1 unit of packed red blood cells. Continue to hold heparin and resume Coumadin hematuria has resolved. CBC post transfusional.  Paroxysmal atrial fibrillation: Holding Coreg due to hypotension.  Heart rate is controlled  Chronic pain/fibromyalgia: Hold Voltaren gel.  History of BPH: Continue Flomax.  Chronic pain/fibromyalgia: Resume  Norco  Essential hypertension: Hold Coreg, lisinopril and all other antihypertensive medication.  Hyperlipidemia: Continue statins.   DVT prophylaxis: heparin Family Communication:none Status is: Inpatient Remains inpatient appropriate because: Gross hematuria in the setting of IV heparin.  Transferred to telemetry.    Code Status:     Code Status Orders  (From admission, onward)           Start     Ordered   09/21/22 1232  Full code  Continuous       Question:  By:  Answer:  Consent: discussion documented in EHR   09/21/22 1233           Code Status History     This patient has a current code status but no historical code status.         IV Access:   Peripheral IV   Procedures and diagnostic studies:   CT CHEST ABDOMEN PELVIS WO CONTRAST  Result Date: 10/01/2022 CLINICAL DATA:  Fever of unknown source.  Neutropenia. EXAM: CT CHEST, ABDOMEN AND PELVIS WITHOUT CONTRAST TECHNIQUE: Multidetector CT imaging of the chest, abdomen and pelvis was performed following the standard protocol without IV contrast. RADIATION DOSE  REDUCTION: This exam was performed according to the departmental dose-optimization program which includes automated exposure control, adjustment of the mA and/or kV according to patient size and/or use of iterative reconstruction technique. COMPARISON:  09/21/2022 FINDINGS: CT CHEST FINDINGS Cardiovascular: New tiny bilateral pleural  effusions. New atelectasis or consolidation in posterior left lower lobe. New area of ground-glass opacity in the anterior left upper lobe as well as scattered tiny less than 5 mm pulmonary nodules, consistent with infectious or inflammatory etiology. Mediastinum/Lymph Nodes: No masses or pathologically enlarged lymph nodes identified on this unenhanced exam. Lungs/Pleura: No evidence of infiltrate, mass, or pleural effusion. Musculoskeletal:  No suspicious bone lesions identified. CT ABDOMEN AND PELVIS FINDINGS Hepatobiliary: No masses visualized on this unenhanced exam. Gallbladder is unremarkable. No evidence of biliary ductal dilatation. Pancreas: No mass or inflammatory changes identified on this unenhanced exam. Spleen:  Within normal limits in size. Adrenals/Urinary Tract: Multiple small less than 1 cm renal calculi are again seen bilaterally 2 adjacent calculi remain in the distal left ureter measure 5 mm in 6 mm. No evidence of hydroureteronephrosis. Foley catheter seen in the urinary bladder which is empty. Stomach/Bowel: No evidence of obstruction, inflammatory process, or abnormal fluid collections. A small right inguinal hernia is again seen containing several small bowel loops. No No evidence of bowel obstruction or strangulation. Three small epigastric and paraumbilical ventral hernias are again seen which contain only fat. Vascular/Lymphatic: No pathologically enlarged lymph nodes identified. No abdominal aortic aneurysm. IVC filter in appropriate position. Reproductive:  Stable mildly enlarged prostate. Other:  None. Musculoskeletal:  No suspicious bone lesions identified. IMPRESSION: New tiny bilateral pleural effusions, and left lower lobe atelectasis or consolidation. New ground-glass opacity in anterior left upper lobe, and scattered tiny less than 5 mm pulmonary nodules, consistent with infectious or inflammatory etiology. Bilateral nephrolithiasis. 2 adjacent calculi remain in distal left  ureter, without hydroureteronephrosis. Stable small right inguinal hernia containing several small bowel loops. No evidence of bowel obstruction or strangulation. Stable small epigastric and paraumbilical ventral hernias which contain only fat. Stable mildly enlarged prostate. Electronically Signed   By: Danae Orleans M.D.   On: 10/01/2022 12:27   DG Chest Port 1 View  Result Date: 10/01/2022 CLINICAL DATA:  Shortness of breath EXAM: PORTABLE CHEST 1 VIEW COMPARISON:  09/26/2022 FINDINGS: Heart and mediastinal contours within normal limits. No confluent airspace opacities or effusions. No acute bony abnormality. IMPRESSION: No active disease. Electronically Signed   By: Charlett Nose M.D.   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 Some joint space loss as well. Several well corticated densities are again identified with chronic erosive changes along the inferior aspect of the patella, multiple joint bodies. No significant joint effusion. IMPRESSION: No acute process. 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 Electronically Signed   By: Karen Kays M.D.   On: 09/30/2022 11:36     Medical Consultants:   None.   Subjective:    [name] more awake this morning complaining his knee pain is bothering him more.  Objective:    Vitals:   10/02/22 0100 10/02/22 0200 10/02/22 0300 10/02/22 0400  BP: 124/62 115/75 125/78 (!) 104/57  Pulse: (!) 46 (!) 40 (!) 43 62  Resp: 20 (!) 21 (!) 22 (!) 23  Temp:      TempSrc:      SpO2: 97% 90% 97% 97%  Weight:      Height:       SpO2: 97 % O2 Flow Rate (L/min): 5 L/min   Intake/Output Summary (Last 24 hours) at 10/02/2022 0702 Last data filed at 10/02/2022 2595 Gross per 24 hour   Intake 1910.8 ml  Output 1425 ml  Net 485.8 ml   Filed Weights   09/27/22 0351 09/28/22 0300 10/01/22 0103  Weight: 79.8 kg 76.8 kg 79.4 kg    Exam: General exam: In no acute distress. Respiratory system: Good air movement and clear to auscultation. Cardiovascular system: S1 & S2 heard, RRR. No JVD. Gastrointestinal system: Abdomen is nondistended, soft and nontender.  Extremities: No pedal edema. Skin: No rashes, lesions or ulcers Psychiatry: Judgement and insight appear normal. Mood & affect appropriate.  Data Reviewed:    Labs: Basic Metabolic Panel: Recent Labs  Lab 09/26/22 0305 09/28/22 0254 09/30/22 1055 09/30/22 2302 10/01/22 0015  NA 135 133* 129*  --  130*  K 4.8 4.0 4.6  --  4.5  CL 102 99 97*  --  103  CO2 27 26 26   --  22  GLUCOSE 109* 96 104*  --  103*  BUN 14 13 21   --  26*  CREATININE 1.17 0.94 1.40*  --  1.41*  CALCIUM 8.0* 8.0* 8.2*  --  7.6*  MG  --   --   --  2.1  --   PHOS  --   --   --  3.2  --    GFR Estimated Creatinine Clearance: 45.1 mL/min (A) (by C-G formula based on SCr of 1.41 mg/dL (H)). Liver Function Tests: Recent Labs  Lab 09/28/22 0254  AST 61*  ALT 57*  ALKPHOS 136*  BILITOT 0.9  PROT 4.9*  ALBUMIN 2.6*   No results for input(s): "LIPASE", "AMYLASE" in the last 168 hours.  No results for input(s): "AMMONIA" in the last 168 hours. Coagulation profile Recent Labs  Lab 09/29/22 0547 09/30/22 0515 10/01/22 0015 10/02/22 0256  INR 1.2 1.3* 1.6* 2.0*    COVID-19 Labs  No results for input(s): "DDIMER", "FERRITIN", "LDH", "CRP" in the last 72 hours.  Lab Results  Component Value Date   SARSCOV2NAA NEGATIVE 02/17/2020   SARSCOV2NAA NEGATIVE 09/14/2019    CBC: Recent Labs  Lab 09/28/22 0254 09/29/22 0547 09/30/22 0515 10/01/22 0015 10/01/22 1026 10/02/22 0256  WBC 7.7 8.3 9.7 10.5  --  9.6  NEUTROABS  --   --   --   --  6.1  --   HGB 8.1* 8.6* 8.7* 8.2*  --  7.3*  HCT 24.2* 26.9* 26.9* 25.6*  --   23.6*  MCV 97.6 97.8 98.2 99.2  --  102.2*  PLT 321 390 433* 418*  --  393   Cardiac Enzymes: No results for input(s): "CKTOTAL", "CKMB", "CKMBINDEX", "TROPONINI" in the last 168 hours. BNP (last 3 results) No results for input(s): "PROBNP" in the last 8760 hours. CBG: No results for input(s): "GLUCAP" in the last 168 hours.  D-Dimer: No results for input(s): "DDIMER" in the last 72 hours. Hgb A1c: No results for input(s): "HGBA1C" in the last 72 hours. Lipid Profile: No results for input(s): "CHOL", "HDL", "LDLCALC", "TRIG", "CHOLHDL", "LDLDIRECT" in the last 72 hours. Thyroid function studies: No results for input(s): "TSH", "T4TOTAL", "T3FREE", "THYROIDAB" in the last 72 hours.  Invalid input(s): "FREET3" Anemia work up: No results for  input(s): "VITAMINB12", "FOLATE", "FERRITIN", "TIBC", "IRON", "RETICCTPCT" in the last 72 hours. Sepsis Labs: Recent Labs  Lab 09/29/22 0547 09/30/22 0515 10/01/22 0015 10/02/22 0256  WBC 8.3 9.7 10.5 9.6  LATICACIDVEN  --   --  0.7  --    Microbiology Recent Results (from the past 240 hour(s))  Culture, blood (Routine X 2) w Reflex to ID Panel     Status: None (Preliminary result)   Collection Time: 09/30/22 10:55 AM   Specimen: BLOOD  Result Value Ref Range Status   Specimen Description   Final    BLOOD BLOOD LEFT ARM AEROBIC BOTTLE ONLY ANAEROBIC BOTTLE ONLY Performed at [facility], [address] [place], [place] [address]    Special Requests   Final    BOTTLES DRAWN AEROBIC AND ANAEROBIC Blood Culture adequate volume Performed at [facility], [address], [place], [place] [address]    Culture   Final    NO GROWTH < 24 HOURS Performed at [facility], [address], [place], [place] [address]    Report Status PENDING  Incomplete  Culture, Urine (Do not remove urinary catheter, catheter placed by urology or difficult to place)     Status: None   Collection Time: 09/30/22  10:58 AM   Specimen: Urine, Catheterized  Result Value Ref Range Status   Specimen Description   Final    URINE, CATHETERIZED Performed at [facility], [address] [place], [place] [address]    Special Requests   Final    NONE Performed at [facility], [address] [place], [place] [address]    Culture   Final    NO GROWTH Performed at [facility], [address], [place], [place] [address]    Report Status 10/01/2022 FINAL  Final  Culture, blood (Routine X 2) w Reflex to ID Panel     Status: None (Preliminary result)   Collection Time: 09/30/22 10:59 AM   Specimen: BLOOD  Result Value Ref Range Status   Specimen Description   Final    BLOOD BLOOD RIGHT ARM AEROBIC BOTTLE ONLY ANAEROBIC BOTTLE ONLY Performed at [facility], [address] [place], [place] [address]    Special Requests   Final    BOTTLES DRAWN AEROBIC AND ANAEROBIC Blood Culture adequate volume Performed at [facility], [address], [place], [place] [address]    Culture   Final    NO GROWTH < 24 HOURS Performed at [facility], [address], [place], [place] [address]    Report Status PENDING  Incomplete  Respiratory (~20 pathogens) panel by PCR     Status: None   Collection Time: 10/01/22  8:08 AM   Specimen: Nasopharyngeal Swab; Respiratory  Result Value Ref Range Status   Adenovirus NOT DETECTED NOT DETECTED Final   Coronavirus 229E NOT DETECTED NOT DETECTED Final    Comment: (NOTE) The Coronavirus on the Respiratory Panel, DOES NOT test for the novel  Coronavirus (2019 nCoV)    Coronavirus HKU1 NOT DETECTED NOT DETECTED Final   Coronavirus NL63 NOT DETECTED NOT DETECTED Final   Coronavirus OC43 NOT DETECTED NOT DETECTED Final   Metapneumovirus NOT DETECTED NOT DETECTED Final   Rhinovirus / Enterovirus NOT DETECTED NOT DETECTED Final   Influenza A NOT DETECTED NOT DETECTED Final   Influenza B  NOT DETECTED NOT DETECTED Final   Parainfluenza Virus 1 NOT DETECTED NOT DETECTED  Final   Parainfluenza Virus 2 NOT DETECTED NOT DETECTED Final   Parainfluenza Virus 3 NOT DETECTED NOT DETECTED Final   Parainfluenza Virus 4 NOT DETECTED NOT DETECTED Final   Respiratory Syncytial Virus NOT DETECTED NOT DETECTED Final   Bordetella pertussis NOT DETECTED NOT DETECTED Final   Bordetella Parapertussis NOT DETECTED NOT DETECTED Final   Chlamydophila pneumoniae NOT DETECTED NOT DETECTED Final   Mycoplasma pneumoniae NOT DETECTED NOT DETECTED Final    Comment: Performed at [facility], [address] [place], [place] [address]     Medications:    sodium chloride   Intravenous Once   buPROPion  150 mg Oral Daily   Chlorhexidine Gluconate Cloth  6 each Topical Daily   midodrine  10 mg Oral TID WC   oxybutynin  5 mg Oral TID   polyethylene glycol  17 g Oral BID   tamsulosin  0.4 mg Oral Daily   Continuous Infusions:  sodium chloride Stopped (09/22/22 0853)   sodium chloride Stopped (09/22/22 0853)   sodium chloride 10 mL/hr at 10/02/22 0637   ceFEPime (MAXIPIME) IV Stopped (10/01/22 2105)   promethazine (PHENERGAN) injection (IM or IVPB) Stopped (10/02/22 0310)   vancomycin        LOS: 11 days   Marinda Elk  [facility]  10/02/2022, 7:02 AM

## 2022-10-02 NOTE — Progress Notes (Signed)
ANTICOAGULATION CONSULT NOTE - Follow Up Consult  Pharmacy Consult for warfarin (holding heparin due to intermittent gross hematuria per Md) Indication: acute pulmonary embolus and DVT  Allergies  Allergen Reactions   Other Other (See Comments)    Mangos - caused a severe rash   Prozac [Fluoxetine Hcl]     Made him feel "loopy"   Sulfa Antibiotics Hives   Vicodin [Hydrocodone-Acetaminophen] Itching    Tolerates oxycodone   Erythromycin Rash    Many years ago an oral antibiotic caused a rash(wife and patient think it was erythromycin but they are not positive).    Patient Measurements: Height: 6' (182.9 cm) Weight: 79.4 kg (175 lb 0.7 oz) IBW/kg (Calculated) : 77.6 Heparin Dosing Weight: 77 kg  Vital Signs: Temp: 99.2 F (37.3 C) (08/25 0814) Temp Source: Oral (08/25 0814) BP: 124/79 (08/25 0800) Pulse Rate: 83 (08/25 0800)  Labs: Recent Labs    09/30/22 0515 09/30/22 1055 10/01/22 0015 10/02/22 0256  HGB 8.7*  --  8.2* 7.3*  HCT 26.9*  --  25.6* 23.6*  PLT 433*  --  418* 393  LABPROT 16.8*  --  19.7* 23.0*  INR 1.3*  --  1.6* 2.0*  HEPARINUNFRC 0.32  --  0.51  --   CREATININE  --  1.40* 1.41*  --     Estimated Creatinine Clearance: 45.1 mL/min (A) (by C-G formula based on SCr of 1.41 mg/dL (H)).  Assessment: Patient is an 82 y.o M with recent COVID infection who presented to the ED on 09/21/22 with c/o abdominal pain, HA, SOB and dizziness. He was subsequently found to have acute bilateral PE with RHS and RLE DVT. Catheter directed tPA initiated on 8/14, but stopped after 4 hours due to bleeding. IVC filter placed on 09/25/22.  He's currently on warfarin with  heparin drip for bridging for acute VTE treatment.   Significant events:  - 8/14 chest CT: acute BL PE with RHS. Ureteral stones. - 8/14 LE doppler: acute RLE  - 8/14: B catheter directed thrombolysis. RN reported bleeding bleeding from penis, surgical site on his face, and hematuria. CCM consulted, stopped  tpa @ 2128 (only got tPA for 4 hrs) - 8/15: heparin resumed with NO BOLUS - 8/18: heparin d/c'd due to bleeding and IVC filter placed. - 8/19: heparin resumed given worsening PE sx's (chest tightness, SOB) - 8/21: started on warfarin - 8/24: UFH/warfarin d/c'd due to gross hematuria - 8/25: hematuria resolved, restart warfarin but not UFH  Today, 10/02/2022: INR therapeutic despite no warfarin given 8/24 and only 2 doses total given previous 2 days (7.5mg  and 5mg ) Hgb 7.3, down, monitor No further gross hematuria per notes, see above  Goal of Therapy:  Heparin level 0.3-0.7 units/ml Monitor platelets by anticoagulation protocol: Yes   Plan:  Only 2.5mg  warfarin today Daily INR and CBC Monitor closely for bleeding, particularly restart of hematuria   Hessie Knows, PharmD, BCPS Secure Chat if ?s 10/02/2022 10:55 AM

## 2022-10-03 ENCOUNTER — Inpatient Hospital Stay (HOSPITAL_COMMUNITY): Payer: Medicare Other

## 2022-10-03 ENCOUNTER — Encounter (HOSPITAL_COMMUNITY): Payer: Self-pay | Admitting: Internal Medicine

## 2022-10-03 DIAGNOSIS — N179 Acute kidney failure, unspecified: Secondary | ICD-10-CM | POA: Diagnosis not present

## 2022-10-03 DIAGNOSIS — I2609 Other pulmonary embolism with acute cor pulmonale: Secondary | ICD-10-CM | POA: Diagnosis not present

## 2022-10-03 LAB — CBC
HCT: 27.1 % — ABNORMAL LOW (ref 39.0–52.0)
Hemoglobin: 8.5 g/dL — ABNORMAL LOW (ref 13.0–17.0)
MCH: 31 pg (ref 26.0–34.0)
MCHC: 31.4 g/dL (ref 30.0–36.0)
MCV: 98.9 fL (ref 80.0–100.0)
Platelets: 450 10*3/uL — ABNORMAL HIGH (ref 150–400)
RBC: 2.74 MIL/uL — ABNORMAL LOW (ref 4.22–5.81)
RDW: 14.6 % (ref 11.5–15.5)
WBC: 9.2 10*3/uL (ref 4.0–10.5)
nRBC: 0 % (ref 0.0–0.2)

## 2022-10-03 LAB — BPAM RBC
Blood Product Expiration Date: 202409232359
ISSUE DATE / TIME: 202408251419
Unit Type and Rh: 5100

## 2022-10-03 LAB — TYPE AND SCREEN
ABO/RH(D): O POS
Antibody Screen: NEGATIVE
Unit division: 0

## 2022-10-03 LAB — PROTIME-INR
INR: 2.1 — ABNORMAL HIGH (ref 0.8–1.2)
Prothrombin Time: 23.4 seconds — ABNORMAL HIGH (ref 11.4–15.2)

## 2022-10-03 MED ORDER — IOHEXOL 300 MG/ML  SOLN
100.0000 mL | Freq: Once | INTRAMUSCULAR | Status: AC | PRN
  Administered 2022-10-03: 100 mL via INTRAVENOUS

## 2022-10-03 MED ORDER — SODIUM CHLORIDE (PF) 0.9 % IJ SOLN
INTRAMUSCULAR | Status: AC
  Filled 2022-10-03: qty 50

## 2022-10-03 MED ORDER — SODIUM CHLORIDE 0.9 % IV SOLN
2.0000 g | Freq: Three times a day (TID) | INTRAVENOUS | Status: DC
  Administered 2022-10-03: 2 g via INTRAVENOUS
  Filled 2022-10-03: qty 12.5

## 2022-10-03 MED ORDER — MIDODRINE HCL 5 MG PO TABS
5.0000 mg | ORAL_TABLET | Freq: Three times a day (TID) | ORAL | Status: DC
  Administered 2022-10-03 – 2022-10-05 (×8): 5 mg via ORAL
  Filled 2022-10-03 (×8): qty 1

## 2022-10-03 MED ORDER — AMOXICILLIN-POT CLAVULANATE 875-125 MG PO TABS
1.0000 | ORAL_TABLET | Freq: Two times a day (BID) | ORAL | Status: DC
Start: 2022-10-03 — End: 2022-10-03

## 2022-10-03 MED ORDER — WARFARIN SODIUM 2.5 MG PO TABS
2.5000 mg | ORAL_TABLET | Freq: Once | ORAL | Status: AC
  Administered 2022-10-03: 2.5 mg via ORAL
  Filled 2022-10-03: qty 1

## 2022-10-03 MED ORDER — SODIUM CHLORIDE 0.9 % IV SOLN
2.0000 g | Freq: Three times a day (TID) | INTRAVENOUS | Status: DC
  Administered 2022-10-03 – 2022-10-04 (×2): 2 g via INTRAVENOUS
  Filled 2022-10-03 (×2): qty 12.5

## 2022-10-03 MED ORDER — VANCOMYCIN HCL 1500 MG/300ML IV SOLN
1500.0000 mg | INTRAVENOUS | Status: DC
  Administered 2022-10-03: 1500 mg via INTRAVENOUS
  Filled 2022-10-03: qty 300

## 2022-10-03 NOTE — Progress Notes (Signed)
[facility] PROGRESS NOTE    Progress Note  [name]  OZH:[id number] DOB: 01-31-1941 DOA: 09/21/2022 PCP: Shirline Frees, NP     Brief Narrative:   [name] is an 82 y.o. male past medical history of BPH, essential hypertension presents to the ED with marked exertional dyspnea and near syncope, 2D echo showed right heart strain was found to have a submassive PE admitted to the ICU, 2D echo showed right ventricular failure, IR consulted and performed direct thrombolysis, after 5 hours started developing gross hematuria, urology was consulted.  Foley catheter was placed with CBI initially.  IV heparin was stopped, IR was consulted as hematuria continue to worsen IVC filter was placed on 09/25/2022.   Assessment/Plan:   Acute respiratory failure with hypoxia secondary to submassive Pulmonary emboli (HCC) Initially started on heparin, now INR 2.1, is now on Coumadin. Follow-up with interventional radiology to remove IVC filter. PT OT to continue to work with them, will need home health PT.  Gross hematuria: Secondary to thrombolysis. Urology was consulted, Continue Foley and discharge with Foley as an outpatient, continue Flomax and follow-up with them as an outpatient. Hemoglobin is 8.5 this morning. Hematuria has now resolved. Further management per urology.  Severe sepsis possibly due to healthcare associated pneumonia Hypotension resolved he has remained afebrile. Continue IV vancomycin and cefepime. Blood pressure is trending up we will slowly wean off midodrine.  Culture data has remained negative till date. CT of the chest showed new left upper lobe infiltrate  Acute kidney injury: Likely due to sepsis was started on IV fluids and midodrine. Resolved with IV fluid hydration and midodrine, creatinine is back to baseline.  Thrombocytosis: Now resolving.  Right knee pain: Continues to have severe pain, check a CT of the knee.  Acute blood loss  anemia: Secondary to gross hematuria on admission hemoglobin was 14 down to 8, status post 2 units of packed red blood cells hemoglobin this morning is 8.5. Continue Coumadin hematuria has resolved.  Paroxysmal atrial fibrillation: Holding Coreg due to hypotension.  Heart rate is controlled  Chronic pain/fibromyalgia: Hold Voltaren gel.  History of BPH: Continue Flomax.  Chronic pain/fibromyalgia: Resume  Norco  Essential hypertension: Hold Coreg, lisinopril and all other antihypertensive medication.  Hyperlipidemia: Continue statins.   DVT prophylaxis: heparin Family Communication:none Status is: Inpatient Remains inpatient appropriate because: Gross hematuria in the setting of IV heparin.  Transferred to telemetry.    Code Status:     Code Status Orders  (From admission, onward)           Start     Ordered   09/21/22 1232  Full code  Continuous       Question:  By:  Answer:  Consent: discussion documented in EHR   09/21/22 1233           Code Status History     This patient has a current code status but no historical code status.         IV Access:   Peripheral IV   Procedures and diagnostic studies:   CT CHEST ABDOMEN PELVIS WO CONTRAST  Result Date: 10/01/2022 CLINICAL DATA:  Fever of unknown source.  Neutropenia. EXAM: CT CHEST, ABDOMEN AND PELVIS WITHOUT CONTRAST TECHNIQUE: Multidetector CT imaging of the chest, abdomen and pelvis was performed following the standard protocol without IV contrast. RADIATION DOSE REDUCTION: This exam was performed according to the departmental dose-optimization program which includes automated exposure control, adjustment of the mA and/or kV according  to patient size and/or use of iterative reconstruction technique. COMPARISON:  09/21/2022 FINDINGS: CT CHEST FINDINGS Cardiovascular: New tiny bilateral pleural effusions. New atelectasis or consolidation in posterior left lower lobe. New area of ground-glass  opacity in the anterior left upper lobe as well as scattered tiny less than 5 mm pulmonary nodules, consistent with infectious or inflammatory etiology. Mediastinum/Lymph Nodes: No masses or pathologically enlarged lymph nodes identified on this unenhanced exam. Lungs/Pleura: No evidence of infiltrate, mass, or pleural effusion. Musculoskeletal:  No suspicious bone lesions identified. CT ABDOMEN AND PELVIS FINDINGS Hepatobiliary: No masses visualized on this unenhanced exam. Gallbladder is unremarkable. No evidence of biliary ductal dilatation. Pancreas: No mass or inflammatory changes identified on this unenhanced exam. Spleen:  Within normal limits in size. Adrenals/Urinary Tract: Multiple small less than 1 cm renal calculi are again seen bilaterally 2 adjacent calculi remain in the distal left ureter measure 5 mm in 6 mm. No evidence of hydroureteronephrosis. Foley catheter seen in the urinary bladder which is empty. Stomach/Bowel: No evidence of obstruction, inflammatory process, or abnormal fluid collections. A small right inguinal hernia is again seen containing several small bowel loops. No No evidence of bowel obstruction or strangulation. Three small epigastric and paraumbilical ventral hernias are again seen which contain only fat. Vascular/Lymphatic: No pathologically enlarged lymph nodes identified. No abdominal aortic aneurysm. IVC filter in appropriate position. Reproductive:  Stable mildly enlarged prostate. Other:  None. Musculoskeletal:  No suspicious bone lesions identified. IMPRESSION: New tiny bilateral pleural effusions, and left lower lobe atelectasis or consolidation. New ground-glass opacity in anterior left upper lobe, and scattered tiny less than 5 mm pulmonary nodules, consistent with infectious or inflammatory etiology. Bilateral nephrolithiasis. 2 adjacent calculi remain in distal left ureter, without hydroureteronephrosis. Stable small right inguinal hernia containing several small  bowel loops. No evidence of bowel obstruction or strangulation. Stable small epigastric and paraumbilical ventral hernias which contain only fat. Stable mildly enlarged prostate. Electronically Signed   By: Danae Orleans M.D.   On: 10/01/2022 12:27     Medical Consultants:   None.   Subjective:    [name] feels good he is complains of knee pain.  Objective:    Vitals:   10/03/22 0400 10/03/22 0402 10/03/22 0500 10/03/22 0718  BP: 110/65  110/64   Pulse: 71  (!) 55   Resp: 19  19   Temp:  98.3 F (36.8 C)  98.9 F (37.2 C)  TempSrc:  Oral  Oral  SpO2: 93%  93%   Weight:   81.2 kg   Height:       SpO2: 93 % O2 Flow Rate (L/min): 5 L/min   Intake/Output Summary (Last 24 hours) at 10/03/2022 0734 Last data filed at 10/03/2022 0606 Gross per 24 hour  Intake 2062.34 ml  Output 1350 ml  Net 712.34 ml   Filed Weights   10/01/22 0103 10/02/22 0701 10/03/22 0500  Weight: 79.4 kg 79.4 kg 81.2 kg    Exam: General exam: In no acute distress. Respiratory system: Good air movement and clear to auscultation. Cardiovascular system: S1 & S2 heard, RRR. No JVD. Gastrointestinal system: Abdomen is nondistended, soft and nontender.  Extremities: Right knee swelling not warm tender to touch, no erythema Skin: No rashes, lesions or ulcers Psychiatry: Judgement and insight appear normal. Mood & affect appropriate.  Data Reviewed:    Labs: Basic Metabolic Panel: Recent Labs  Lab 09/28/22 0254 09/30/22 1055 09/30/22 2302 10/01/22 0015 10/02/22 1104  NA 133* 129*  --  130* 133*  K 4.0 4.6  --  4.5 4.4  CL 99 97*  --  103 106  CO2 26 26  --  22 17*  GLUCOSE 96 104*  --  103* 90  BUN 13 21  --  26* 24*  CREATININE 0.94 1.40*  --  1.41* 1.04  CALCIUM 8.0* 8.2*  --  7.6* 8.2*  MG  --   --  2.1  --   --   PHOS  --   --  3.2  --   --    GFR Estimated Creatinine Clearance: 61.1 mL/min (by C-G formula based on SCr of 1.04 mg/dL). Liver Function Tests: Recent Labs   Lab 09/28/22 0254  AST 61*  ALT 57*  ALKPHOS 136*  BILITOT 0.9  PROT 4.9*  ALBUMIN 2.6*   No results for input(s): "LIPASE", "AMYLASE" in the last 168 hours.  No results for input(s): "AMMONIA" in the last 168 hours. Coagulation profile Recent Labs  Lab 09/29/22 0547 09/30/22 0515 10/01/22 0015 10/02/22 0256 10/03/22 0314  INR 1.2 1.3* 1.6* 2.0* 2.1*    COVID-19 Labs  No results for input(s): "DDIMER", "FERRITIN", "LDH", "CRP" in the last 72 hours.  Lab Results  Component Value Date   SARSCOV2NAA NEGATIVE 02/17/2020   SARSCOV2NAA NEGATIVE 09/14/2019    CBC: Recent Labs  Lab 09/29/22 0547 09/30/22 0515 10/01/22 0015 10/01/22 1026 10/02/22 0256 10/02/22 1855 10/03/22 0314  WBC 8.3 9.7 10.5  --  9.6  --  9.2  NEUTROABS  --   --   --  6.1  --   --   --   HGB 8.6* 8.7* 8.2*  --  7.3* 8.7* 8.5*  HCT 26.9* 26.9* 25.6*  --  23.6* 27.2* 27.1*  MCV 97.8 98.2 99.2  --  102.2*  --  98.9  PLT 390 433* 418*  --  393  --  450*   Cardiac Enzymes: No results for input(s): "CKTOTAL", "CKMB", "CKMBINDEX", "TROPONINI" in the last 168 hours. BNP (last 3 results) No results for input(s): "PROBNP" in the last 8760 hours. CBG: No results for input(s): "GLUCAP" in the last 168 hours.  D-Dimer: No results for input(s): "DDIMER" in the last 72 hours. Hgb A1c: No results for input(s): "HGBA1C" in the last 72 hours. Lipid Profile: No results for input(s): "CHOL", "HDL", "LDLCALC", "TRIG", "CHOLHDL", "LDLDIRECT" in the last 72 hours. Thyroid function studies: No results for input(s): "TSH", "T4TOTAL", "T3FREE", "THYROIDAB" in the last 72 hours.  Invalid input(s): "FREET3" Anemia work up: No results for input(s): "VITAMINB12", "FOLATE", "FERRITIN", "TIBC", "IRON", "RETICCTPCT" in the last 72 hours. Sepsis Labs: Recent Labs  Lab 09/30/22 0515 10/01/22 0015 10/02/22 0256 10/03/22 0314  WBC 9.7 10.5 9.6 9.2  LATICACIDVEN  --  0.7  --   --    Microbiology Recent Results  (from the past 240 hour(s))  Culture, blood (Routine X 2) w Reflex to ID Panel     Status: None (Preliminary result)   Collection Time: 09/30/22 10:55 AM   Specimen: BLOOD  Result Value Ref Range Status   Specimen Description   Final    BLOOD BLOOD LEFT ARM AEROBIC BOTTLE ONLY ANAEROBIC BOTTLE ONLY Performed at [facility], [address], [place], [place] [address]    Special Requests   Final    BOTTLES DRAWN AEROBIC AND ANAEROBIC Blood Culture adequate volume Performed at [facility], [address] [place], [place] [address]    Culture   Final  NO GROWTH 2 DAYS Performed at [facility], [address], [place], [place] [address]    Report Status PENDING  Incomplete  Culture, Urine (Do not remove urinary catheter, catheter placed by urology or difficult to place)     Status: None   Collection Time: 09/30/22 10:58 AM   Specimen: Urine, Catheterized  Result Value Ref Range Status   Specimen Description   Final    URINE, CATHETERIZED Performed at [facility], [address] [place], [place] [address]    Special Requests   Final    NONE Performed at [facility], [address] [place], [place] [address]    Culture   Final    NO GROWTH Performed at [facility], [address], [place], [place] [address]    Report Status 10/01/2022 FINAL  Final  Culture, blood (Routine X 2) w Reflex to ID Panel     Status: None (Preliminary result)   Collection Time: 09/30/22 10:59 AM   Specimen: BLOOD  Result Value Ref Range Status   Specimen Description   Final    BLOOD BLOOD RIGHT ARM AEROBIC BOTTLE ONLY ANAEROBIC BOTTLE ONLY Performed at [facility], [address] [place], [place] [address]    Special Requests   Final    BOTTLES DRAWN AEROBIC AND ANAEROBIC Blood Culture adequate volume Performed at [facility], [address], [place], [place]  [address]    Culture   Final    NO GROWTH 2 DAYS Performed at [facility], [address], [place], [place] [address]    Report Status PENDING  Incomplete  Respiratory (~20 pathogens) panel by PCR     Status: None   Collection Time: 10/01/22  8:08 AM   Specimen: Nasopharyngeal Swab; Respiratory  Result Value Ref Range Status   Adenovirus NOT DETECTED NOT DETECTED Final   Coronavirus 229E NOT DETECTED NOT DETECTED Final    Comment: (NOTE) The Coronavirus on the Respiratory Panel, DOES NOT test for the novel  Coronavirus (2019 nCoV)    Coronavirus HKU1 NOT DETECTED NOT DETECTED Final   Coronavirus NL63 NOT DETECTED NOT DETECTED Final   Coronavirus OC43 NOT DETECTED NOT DETECTED Final   Metapneumovirus NOT DETECTED NOT DETECTED Final   Rhinovirus / Enterovirus NOT DETECTED NOT DETECTED Final   Influenza A NOT DETECTED NOT DETECTED Final   Influenza B NOT DETECTED NOT DETECTED Final   Parainfluenza Virus 1 NOT DETECTED NOT DETECTED Final   Parainfluenza Virus 2 NOT DETECTED NOT DETECTED Final   Parainfluenza Virus 3 NOT DETECTED NOT DETECTED Final   Parainfluenza Virus 4 NOT DETECTED NOT DETECTED Final   Respiratory Syncytial Virus NOT DETECTED NOT DETECTED Final   Bordetella pertussis NOT DETECTED NOT DETECTED Final   Bordetella Parapertussis NOT DETECTED NOT DETECTED Final   Chlamydophila pneumoniae NOT DETECTED NOT DETECTED Final   Mycoplasma pneumoniae NOT DETECTED NOT DETECTED Final    Comment: Performed at [facility], [address], [place], [place] [address]     Medications:    buPROPion  150 mg Oral Daily   Chlorhexidine Gluconate Cloth  6 each Topical Daily   colchicine  0.3 mg Oral BID   midodrine  10 mg Oral TID WC   oxybutynin  5 mg Oral TID   tamsulosin  0.4 mg Oral Daily   Warfarin - Pharmacist Dosing Inpatient   Does not apply q1600   Continuous Infusions:  sodium chloride 75 mL/hr at 10/03/22 0606   ceFEPime (MAXIPIME) IV Stopped (10/02/22  2154)   promethazine (PHENERGAN) injection (IM or IVPB) Stopped (10/02/22 0310)   vancomycin Stopped (10/02/22 1239)      LOS: 12 days   Marinda Elk  [facility]  10/03/2022, 7:34 AM

## 2022-10-03 NOTE — Progress Notes (Signed)
Pharmacy Antibiotic Note  [name] is a 82 y.o. male admitted on 09/21/2022. Pharmacy consulted for vanc/cefepime dosing for sepsis. Concern for pneumonia.  Plan: -Continue cefepime - increase to 2 g IV q8h for improved renal function -Discontinue vancomycin per discussion with MD  Height: 6' (182.9 cm) Weight: 81.2 kg (179 lb 0.2 oz) IBW/kg (Calculated) : 77.6  Temp (24hrs), Avg:98.8 F (37.1 C), Min:98 F (36.7 C), Max:99.2 F (37.3 C)  Recent Labs  Lab 09/28/22 0254 09/29/22 0547 09/30/22 0515 09/30/22 1055 10/01/22 0015 10/02/22 0256 10/02/22 1104 10/03/22 0314  WBC 7.7 8.3 9.7  --  10.5 9.6  --  9.2  CREATININE 0.94  --   --  1.40* 1.41*  --  1.04  --   LATICACIDVEN  --   --   --   --  0.7  --   --   --     Estimated Creatinine Clearance: 61.1 mL/min (by C-G formula based on SCr of 1.04 mg/dL).    Allergies  Allergen Reactions   Other Other (See Comments)    Mangos - caused a severe rash   Prozac [Fluoxetine Hcl]     Made him feel "loopy"   Sulfa Antibiotics Hives   Vicodin [Hydrocodone-Acetaminophen] Itching    Tolerates oxycodone   Erythromycin Rash    Many years ago an oral antibiotic caused a rash(wife and patient think it was erythromycin but they are not positive).      Thank you for allowing pharmacy to be a part of this patient's care.  Pricilla Riffle, PharmD, BCPS Clinical Pharmacist 10/03/2022 1:52 PM

## 2022-10-03 NOTE — Progress Notes (Signed)
PT Cancellation Note  Patient Details Name: [name] MRN: [id number] DOB: August 22, 1940   Cancelled Treatment:    Reason Eval/Treat Not Completed: Pain limiting ability to participate in PM. In AM ambulating with RN. Will check back another time  Blanchard Kelch PT Acute Rehabilitation Services Office [phone] Weekend [phone]  Rada Hay 10/03/2022, 3:52 PM

## 2022-10-03 NOTE — Consult Note (Signed)
Reason for Consult: Right knee pain and swelling Referring Physician: Dr. Jennings Books is an 82 y.o. male.  HPI: Mr Cull is a pleasant 82 yo male who was admitted 12 days ago for PE and found to be septic as well with pneumonia. He began having R knee pain and swelling over the past 48hrs and we were consulted for evaluation and to R/O anything ominous such as septic arthritis or even gout. He reports a hx of knee pain and getting xrays through his PCP approx 21mo ago as he has been unable to easily bend his knee and had been noticing altered gait and pain. He has never had injection into the knee or any orthopaedic tx outside of a knee injury in his youth. He had been taking prescription ibuprofen Q8 hrs daily for several months with some benefit however this was discontinued upon admission and cannot be restarted secondary to anticoagulation. He describes sever pain inside and in the front of the knee with all ROM especially flexion. He denies any radiating pain.   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 - occas slight headaches since the surgery   Colonic mass 2001   Fatigue    x 1 week o as of 02-17-2020   Fibromyalgia 1990's    Frequent PVCs    Generalized body aches    x 1 week as of 02-17-2020   H/O inguinal hernia    right side   Hernia, umbilical    History of kidney stones    HX OF MULTIPLE KIDNEY STONES AND  CURRENTLY HAS BILATERAL RENAL STONES CAUSING ABDOMINAL AND BACK PAIN   Hypertension    high blood pressure readings    NSVT (nonsustained ventricular tachycardia) (HCC)    Premature atrial contractions    Prolonged Q-T interval on ECG    hx of none currently   Wears dentures    full dentures    Past Surgical History:  Procedure Laterality Date   BRAIN TUMOR EXCISION  age 58   CARDIAC CATHETERIZATION N/A 04/09/2015   Procedure: Left Heart Cath  and Coronary Angiography;  Surgeon: Kathleene Hazel, MD;  Location: [facility] INVASIVE CV LAB;  Service: Cardiovascular;  Laterality: N/A;   COLON SURGERY  2001   COLON RESECTION FOR GROWTH - NOT CANCER   CYSTOSCOPY WITH RETROGRADE PYELOGRAM, URETEROSCOPY AND STENT PLACEMENT Left 10/05/2012   Procedure: CYSTOSCOPY WITH RETROGRADE PYELOGRAM, URETEROSCOPY AND STENT PLACEMENT;  Surgeon: Sebastian Ache, MD;  Location: [facility];  Service: Urology;  Laterality: Left;   CYSTOSCOPY/RETROGRADE/URETEROSCOPY/STONE EXTRACTION WITH BASKET Right 10/05/2012   Procedure: CYSTOSCOPY/RETROGRADE/URETEROSCOPY/STONE EXTRACTION WITH BASKET;  Surgeon: Sebastian Ache, MD;  Location: [facility];  Service: Urology;  Laterality: Right;   CYSTOSCOPY/RETROGRADE/URETEROSCOPY/STONE EXTRACTION WITH BASKET Bilateral 10/24/2012   Procedure: CYSTOSCOPY/RETROGRADE/URETEROSCOPY/STONE EXTRACTION WITH BASKET;  Surgeon: Sebastian Ache, MD;  Location: [facility];  Service: Urology;  Laterality: Bilateral;  with bilateral stent placement   HOLMIUM LASER APPLICATION Right 10/05/2012   Procedure: HOLMIUM LASER APPLICATION;  Surgeon: Sebastian Ache, MD;  Location: [facility];  Service: Urology;  Laterality: Right;   HOLMIUM LASER APPLICATION Bilateral 10/24/2012   Procedure: HOLMIUM LASER APPLICATION;  Surgeon: Sebastian Ache, MD;  Location: [facility];  Service: Urology;  Laterality: Bilateral;   IR ANGIOGRAM PULMONARY BILATERAL SELECTIVE  09/21/2022   IR INFUSION THROMBOL ARTERIAL INITIAL (MS)  09/21/2022   IR INFUSION THROMBOL ARTERIAL INITIAL (MS)  09/21/2022   IR IVC FILTER PLMT / S&I /IMG GUID/MOD SED  09/25/2022   IR THROMB F/U EVAL ART/VEN FINAL DAY (MS)  09/22/2022   IR US GUIDE VASC ACCESS RIGHT  09/21/2022   LITHOTRIPSY     TONSILLECTOMY  age 10's   and adenoids    Family History  Problem Relation Age of Onset   Colon cancer Father    Heart disease Father        quad bypass    Social History:  reports that he has never smoked. He has never used  smokeless tobacco. He reports current alcohol use of about 7.0 standard drinks of alcohol per week. He reports that he does not currently use drugs.  Allergies:  Allergies  Allergen Reactions   Other Other (See Comments)    Mangos - caused a severe rash   Prozac [Fluoxetine Hcl]     Made him feel "loopy"   Sulfa Antibiotics Hives   Vicodin [Hydrocodone-Acetaminophen] Itching    Tolerates oxycodone   Erythromycin Rash    Many years ago an oral antibiotic caused a rash(wife and patient think it was erythromycin but they are not positive).    Medications:  Current Facility-Administered Medications:    acetaminophen (TYLENOL) tablet 650 mg, 650 mg, Oral, Q6H PRN, Marinda Elk, MD, 650 mg at 10/01/22 1854   buPROPion (WELLBUTRIN XL) 24 hr tablet 150 mg, 150 mg, Oral, Daily, Marinda Elk, MD, 150 mg at 10/03/22 0941   ceFEPIme (MAXIPIME) 2 g in sodium chloride 0.9 % 100 mL IVPB, 2 g, Intravenous, Q8H, Wofford, Drew A, RPH   Chlorhexidine Gluconate Cloth 2 % PADS 6 each, 6 each, Topical, Daily, Marinda Elk, MD, 6 each at 10/02/22 1027   colchicine tablet 0.3 mg, 0.3 mg, Oral, BID, Marinda Elk, MD, 0.3 mg at 10/03/22 0940   docusate sodium (COLACE) capsule 100 mg, 100 mg, Oral, BID PRN, Marinda Elk, MD, 100 mg at 10/01/22 1645   lidocaine (XYLOCAINE) 2 % jelly 1 Application, 1 Application, Urethral, PRN, Marinda Elk, MD, 1 Application at 09/28/22 0924   midodrine (PROAMATINE) tablet 5 mg, 5 mg, Oral, TID WC, Marinda Elk, MD, 5 mg at 10/03/22 1126   mineral oil-hydrophilic petrolatum (AQUAPHOR) ointment, , Topical, Daily PRN, Marinda Elk, MD   ondansetron [facility]) injection 4 mg, 4 mg, Intravenous, Q6H PRN, 4 mg at 10/02/22 1555 **OR** ondansetron (ZOFRAN) tablet 4 mg, 4 mg, Oral, Q6H PRN, Marinda Elk, MD   Oral care mouth rinse, 15 mL, Mouth Rinse, PRN, Marinda Elk, MD   oxidized cellulose (Surgicel) pad 1  each, 1 each, Topical, PRN, Marinda Elk, MD, 1 each at 09/24/22 2218   oxybutynin (DITROPAN) tablet 5 mg, 5 mg, Oral, TID, Marinda Elk, MD, 5 mg at 10/03/22 1518   oxyCODONE (Oxy IR/ROXICODONE) immediate release tablet 5 mg, 5 mg, Oral, Q3H PRN, Marinda Elk, MD, 5 mg at 10/03/22 1517   promethazine (PHENERGAN) 12.5 mg in sodium chloride 0.9 % 50 mL IVPB, 12.5 mg, Intravenous, Q6H PRN, Marinda Elk, MD, Stopped at 10/02/22 0310   sodium chloride (OCEAN) 0.65 % nasal spray 1 spray, 1 spray, Each Nare, PRN, Marinda Elk, MD, 1 spray at 09/28/22 0550   tamsulosin (FLOMAX) capsule 0.4 mg, 0.4 mg, Oral, Daily, Marinda Elk, MD, 0.4 mg at 10/03/22 0941   traMADol (ULTRAM) tablet 50 mg,  50 mg, Oral, Q6H PRN, Marinda Elk, MD, 50 mg at 10/02/22 2204   Warfarin - Pharmacist Dosing Inpatient, , Does not apply, q1600, Hessie Knows, [facility], Given at 10/03/22 1518   Results for orders placed or performed during the hospital encounter of 09/21/22 (from the past 48 hour(s))  CBC     Status: Abnormal   Collection Time: 10/02/22  2:56 AM  Result Value Ref Range   WBC 9.6 4.0 - 10.5 K/uL   RBC 2.31 (L) 4.22 - 5.81 MIL/uL   Hemoglobin 7.3 (L) 13.0 - 17.0 g/dL   HCT 16.1 (L) 09.6 - 04.5 %   MCV 102.2 (H) 80.0 - 100.0 fL   MCH 31.6 26.0 - 34.0 pg   MCHC 30.9 30.0 - 36.0 g/dL   RDW 40.9 81.1 - 91.4 %   Platelets 393 150 - 400 K/uL   nRBC 0.0 0.0 - 0.2 %    Comment: Performed at [facility], [address] [place], [place] [address]  Protime-INR     Status: Abnormal   Collection Time: 10/02/22  2:56 AM  Result Value Ref Range   Prothrombin Time 23.0 (H) 11.4 - 15.2 seconds   INR 2.0 (H) 0.8 - 1.2    Comment: (NOTE) INR goal varies based on device and disease states. Performed at [facility], [address], [place], [place] [address]   Type and screen [facility]     Status: None    Collection Time: 10/02/22 11:04 AM  Result Value Ref Range   ABO/RH(D) O POS    Antibody Screen NEG    Sample Expiration 10/05/2022,2359    Unit Number [id number]    Blood Component Type RED CELLS,LR    Unit division 00    Status of Unit ISSUED,FINAL    Transfusion Status OK TO TRANSFUSE    Crossmatch Result      Compatible Performed at [facility], [address], [place], [place] [address]   Basic metabolic panel     Status: Abnormal   Collection Time: 10/02/22 11:04 AM  Result Value Ref Range   Sodium 133 (L) 135 - 145 mmol/L   Potassium 4.4 3.5 - 5.1 mmol/L   Chloride 106 98 - 111 mmol/L   CO2 17 (L) 22 - 32 mmol/L   Glucose, Bld 90 70 - 99 mg/dL    Comment: Glucose reference range applies only to samples taken after fasting for at least 8 hours.   BUN 24 (H) 8 - 23 mg/dL   Creatinine, Ser 4.40 0.61 - 1.24 mg/dL   Calcium 8.2 (L) 8.9 - 10.3 mg/dL   GFR, Estimated >10 >27 mL/min    Comment: (NOTE) Calculated using the CKD-EPI Creatinine Equation (2021)    Anion gap 10 5 - 15    Comment: Performed at [facility], [address], [place], [place] [address]  Prepare RBC (crossmatch)     Status: None   Collection Time: 10/02/22 11:08 AM  Result Value Ref Range   Order Confirmation      ORDER PROCESSED BY BLOOD BANK Performed at [facility], [address], [place], [place] [address]   Hemoglobin and hematocrit, blood     Status: Abnormal   Collection Time: 10/02/22  6:55 PM  Result Value Ref Range   Hemoglobin 8.7 (L) 13.0 - 17.0 g/dL   HCT 74.2 (L) 59.5 - 63.8 %    Comment: Performed at  [facility], [address] [place], [place] [address]  CBC     Status: Abnormal   Collection Time: 10/03/22  3:14 AM  Result Value Ref Range   WBC 9.2 4.0 - 10.5 K/uL   RBC 2.74 (L) 4.22 - 5.81 MIL/uL   Hemoglobin 8.5 (L) 13.0 - 17.0 g/dL   HCT 46.9 (L) 62.9 - 52.8 %   MCV 98.9 80.0 - 100.0 fL    MCH 31.0 26.0 - 34.0 pg   MCHC 31.4 30.0 - 36.0 g/dL   RDW 41.3 24.4 - 01.0 %   Platelets 450 (H) 150 - 400 K/uL   nRBC 0.0 0.0 - 0.2 %    Comment: Performed at [facility], [address] [place], [place] [address]  Protime-INR     Status: Abnormal   Collection Time: 10/03/22  3:14 AM  Result Value Ref Range   Prothrombin Time 23.4 (H) 11.4 - 15.2 seconds   INR 2.1 (H) 0.8 - 1.2    Comment: (NOTE) INR goal varies based on device and disease states. Performed at [facility], [address], [place], [place] [address]     CT KNEE RIGHT W CONTRAST  Result Date: 10/03/2022 CLINICAL DATA:  Knee pain, chronic, degenerative disease on xray (Age >= 5y) EXAM: CT OF THE RIGHT KNEE WITH CONTRAST TECHNIQUE: Multidetector CT imaging was performed following the standard protocol during bolus administration of intravenous contrast. RADIATION DOSE REDUCTION: This exam was performed according to the departmental dose-optimization program which includes automated exposure control, adjustment of the mA and/or kV according to patient size and/or use of iterative reconstruction technique. CONTRAST:  OMNIPAQUE IOHEXOL 300 MG/ML  SOLN COMPARISON:  X-ray 09/30/2022 FINDINGS: Bones/Joint/Cartilage No acute fracture. No dislocation. 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 Moderate-large knee joint effusion. Numerous intra-articular loose bodies, the majority of which are present in the suprapatellar pouch although there are several large ossified loose bodies inferior to the patella. Moderate-sized Baker's cyst. Ligaments Suboptimally assessed by CT. Muscles and Tendons No acute musculotendinous abnormality by CT. Soft tissues Mild subcutaneous edema.  No organized fluid  collections. IMPRESSION: 1. No acute osseous abnormality of the right knee. 2. 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 3. Moderate-large knee joint effusion with numerous intra-articular loose bodies. 4. Moderate-sized Baker's cyst. Electronically Signed   By: Duanne Guess D.O.   On: 10/03/2022 09:12    Review of Systems  Constitutional:  Positive for activity change. Negative for chills, diaphoresis, fatigue and fever.  Cardiovascular:  Negative for leg swelling.  Musculoskeletal:  Positive for arthralgias, gait problem and joint swelling.  Skin:  Negative for color change, pallor, rash and wound.  Neurological:  Negative for dizziness and headaches.  Psychiatric/Behavioral:  Negative for agitation, behavioral problems and confusion.    Blood pressure 100/79, pulse 78, temperature 99 F (37.2 C), temperature source Oral, resp. rate 16, height 6' (1.829 m), weight 81.2 kg, SpO2 100%. Physical Exam Vitals and nursing note reviewed.  Constitutional:      General: He is not in acute distress.    Appearance: He is well-developed. He is not ill-appearing.  HENT:     Head: Normocephalic and atraumatic.     Mouth/Throat:     Pharynx: No oropharyngeal exudate.  Eyes:     Extraocular Movements: Extraocular movements intact.  Pulmonary:  Effort: Pulmonary effort is normal.     Breath sounds: Normal breath sounds.  Musculoskeletal:       Legs:     Comments: Mild swelling, no erythema, no increased warmth compared to contralateral side. Px with ROM and decreased ROM to flexion. Able to fully extend when relaxed. Neg pat apprehension. TTP anterior and medial knee. - Valgus/varus Distal compartments soft. Prominent deformity secondary to severe arthritis    Skin:    General: Skin is warm and dry.     Capillary Refill: Capillary refill takes less than 2 seconds.     Coloration: Skin is not cyanotic, jaundiced, mottled or pale.     Findings: No erythema or rash.   Neurological:     General: No focal deficit present.     Mental Status: He is alert and oriented to person, place, and time.  Psychiatric:        Mood and Affect: Mood normal.        Behavior: Behavior normal.     Assessment/Plan:   Severe Right knee pain in a pt with severe DJD, mild swelling   - Not unexpected with SEVERE DJD, 9yr hx of progressive px and loss of mobility, hx improved with rx ibuprofen TID, discontinued on admission 8/14 and unable to resume secondary to anticoagulation for PE, likely cause for acute increase in pain.  - No symptoms/finding concerning for septic arthritis/gout at this time   - F/U outpatient with Dr Luiz Blare, [facility] to discuss tx options   - On chronic px medication already, could add robaxin and voltaren gel 3x day topical   Medicine team following various medical pathologies and we appreciate their assistance. D/C per medical team, OK to D/C from ortho standpoint with F/U outpatient   Eilene Ghazi Margurite Duffy 10/03/2022, 5:22 PM

## 2022-10-03 NOTE — Progress Notes (Signed)
ANTICOAGULATION CONSULT NOTE - Follow Up Consult  Pharmacy Consult for warfarin (holding heparin due to intermittent gross hematuria per MD) Indication: acute pulmonary embolus and DVT  Allergies  Allergen Reactions   Other Other (See Comments)    Mangos - caused a severe rash   Prozac [Fluoxetine Hcl]     Made him feel "loopy"   Sulfa Antibiotics Hives   Vicodin [Hydrocodone-Acetaminophen] Itching    Tolerates oxycodone   Erythromycin Rash    Many years ago an oral antibiotic caused a rash(wife and patient think it was erythromycin but they are not positive).    Patient Measurements: Height: 6' (182.9 cm) Weight: 81.2 kg (179 lb 0.2 oz) IBW/kg (Calculated) : 77.6 Heparin Dosing Weight: 77 kg  Vital Signs: Temp: 99 F (37.2 C) (08/26 1135) Temp Source: Oral (08/26 1135) BP: 109/58 (08/26 1000) Pulse Rate: 64 (08/26 1000)  Labs: Recent Labs    10/01/22 0015 10/02/22 0256 10/02/22 1104 10/02/22 1855 10/03/22 0314  HGB 8.2* 7.3*  --  8.7* 8.5*  HCT 25.6* 23.6*  --  27.2* 27.1*  PLT 418* 393  --   --  450*  LABPROT 19.7* 23.0*  --   --  23.4*  INR 1.6* 2.0*  --   --  2.1*  HEPARINUNFRC 0.51  --   --   --   --   CREATININE 1.41*  --  1.04  --   --     Estimated Creatinine Clearance: 61.1 mL/min (by C-G formula based on SCr of 1.04 mg/dL).  Assessment: Patient is an 82 y.o M with recent COVID infection who presented to the ED on 09/21/22 with c/o abdominal pain, HA, SOB and dizziness. He was subsequently found to have acute bilateral PE with RHS and RLE DVT. Catheter directed tPA initiated on 8/14, but stopped after 4 hours due to bleeding. IVC filter placed on 09/25/22. He was started on heparin drip, which was continued as bridging while starting on warfarin. Heparin was eventually stopped for hematuria and warfarin continued.   Significant events:  - 8/14 chest CT: acute BL PE with RHS. Ureteral stones. - 8/14 LE doppler: acute RLE  - 8/14: B catheter directed  thrombolysis. RN reported bleeding bleeding from penis, surgical site on his face, and hematuria. CCM consulted, stopped tpa @ 2128 (only got tPA for 4 hrs) - 8/15: heparin resumed with NO BOLUS - 8/18: heparin d/c'd due to bleeding and IVC filter placed. - 8/19: heparin resumed given worsening PE sx's (chest tightness, SOB) - 8/21: started on warfarin - 8/24: UFH/warfarin d/c'd due to gross hematuria - 8/25: hematuria resolved, restart warfarin but not UFH  Today, 10/03/2022: INR therapeutic Hgb 8.5 Hematuria resolved  Goal of Therapy:  Heparin level 0.3-0.7 units/ml Monitor platelets by anticoagulation protocol: Yes   Plan:  Continue with warfarin 2.5 mg again today Daily INR Monitor closely for bleeding, particularly restart of hematuria   Pricilla Riffle, PharmD, BCPS Clinical Pharmacist 10/03/2022 12:35 PM

## 2022-10-03 NOTE — Progress Notes (Signed)
OT Cancellation Note  Patient Details Name: [name] MRN: [id number] DOB: Jul 14, 1940   Cancelled Treatment:    Reason Eval/Treat Not Completed: Patient at procedure or test/ unavailable Patient is off floor at CT. OT to continue to follow [name], MS Acute Rehabilitation Department Office# [phone]  10/03/2022, 8:16 AM

## 2022-10-03 NOTE — Plan of Care (Signed)

## 2022-10-03 NOTE — Progress Notes (Signed)
Occupational Therapy Treatment Patient Details Name: [name] MRN: [id number] DOB: 12/05/1940 Today's Date: 10/03/2022   History of present illness patient is a 82 year old  male who presented secondary to marked exertional dyspnea with feeling that he was going to pass out.  On presentation, he was found to have evidence of a submassive pulmonary embolism.  PCCM was consulted and patient admitted to the ICU.  IR consult for the catheter directed thrombolysis for which she tolerated about 5 hours of treatment prior to development of gross hematuria.  Patient transition to heparin IV and urology consulted for management of gross hematuria.  Foley catheter placed with CBI initiated for management.  Echocardiogram confirmed right heart strain. Patient with continued bleeding on heparin IV, with significant blood loss anemia requiring cessation of heparin and consult to IR for an IVC filter, which was successfully placed on 8/18.PMH: BPH, hypertension, hyperlipidemia.   OT comments  Patients session was limited with increased pain in R knee with patient unable to tolerate movement. Patient was educated on shoulder and neck ROM exercises to complete in bed to reduce risk of contractures with current preferred pillow positioning. Patient was also educated on proper body mechanics for pillow positioning. Patient verbalized understanding. Patients family in room reporting that they have concerns over patients d/c home at current level. Recommendations were adjusted at this time.  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 patient unable to tolerate touch to R knee and called out with pain with removal of blankets.    Pertinent Vitals/ Pain       Pain Assessment Pain Assessment: 0-10 Pain Score: 10-Worst pain ever Pain Location: R knee Pain Descriptors / Indicators: Sore, Discomfort, Constant, Moaning Pain Intervention(s): Limited activity within patient's tolerance, Monitored during session, Premedicated before session         Frequency  Min 1X/week        Progress Toward Goals  OT Goals(current goals can now be found in the care plan section)  Progress towards OT goals: Not progressing toward goals - comment (new onset of increased pain with R knee)     Plan         AM-PAC OT "6 Clicks" Daily Activity     Outcome Measure   Help from another person eating meals?: None Help from another person taking care of personal grooming?: A Little Help from another person toileting, which includes using toliet, bedpan, or urinal?: Total  Help from another person bathing (including washing, rinsing, drying)?: A Lot Help from another person to put on and taking off regular upper body clothing?: A Little Help from another person to put on and taking off regular lower body clothing?: A Lot 6 Click Score: 15    End of Session     OT Visit Diagnosis: Unsteadiness on feet (R26.81);Other abnormalities of gait and mobility (R26.89);Muscle weakness (generalized) (M62.81)   Activity Tolerance Patient limited by pain   Patient Left in bed;with call bell/phone within reach;with family/visitor present   Nurse Communication          Time: 1610-9604 OT Time Calculation (min): 22 min  Charges: OT General Charges $OT Visit: 1 Visit OT Treatments $Therapeutic Activity: 8-22 mins  [name], MS Acute Rehabilitation Department Office# [phone]   Selinda Flavin 10/03/2022, 3:57 PM

## 2022-10-04 DIAGNOSIS — I2609 Other pulmonary embolism with acute cor pulmonale: Secondary | ICD-10-CM | POA: Diagnosis not present

## 2022-10-04 LAB — CBC
HCT: 25.7 % — ABNORMAL LOW (ref 39.0–52.0)
Hemoglobin: 8.2 g/dL — ABNORMAL LOW (ref 13.0–17.0)
MCH: 31.3 pg (ref 26.0–34.0)
MCHC: 31.9 g/dL (ref 30.0–36.0)
MCV: 98.1 fL (ref 80.0–100.0)
Platelets: 483 10*3/uL — ABNORMAL HIGH (ref 150–400)
RBC: 2.62 MIL/uL — ABNORMAL LOW (ref 4.22–5.81)
RDW: 14.7 % (ref 11.5–15.5)
WBC: 7.7 10*3/uL (ref 4.0–10.5)
nRBC: 0 % (ref 0.0–0.2)

## 2022-10-04 LAB — PROTIME-INR
INR: 2.2 — ABNORMAL HIGH (ref 0.8–1.2)
Prothrombin Time: 24.9 s — ABNORMAL HIGH (ref 11.4–15.2)

## 2022-10-04 MED ORDER — AMOXICILLIN-POT CLAVULANATE 875-125 MG PO TABS: 1.0000 | ORAL_TABLET | Freq: Two times a day (BID) | ORAL | 0 refills | Status: DC

## 2022-10-04 MED ORDER — WARFARIN SODIUM 2.5 MG PO TABS: 2.5000 mg | ORAL_TABLET | Freq: Every day | ORAL | 0 refills | Status: DC

## 2022-10-04 MED ORDER — WARFARIN SODIUM 2.5 MG PO TABS
2.5000 mg | ORAL_TABLET | Freq: Once | ORAL | Status: AC
  Administered 2022-10-04: 2.5 mg via ORAL
  Filled 2022-10-04: qty 1

## 2022-10-04 MED ORDER — OXYCODONE HCL 5 MG PO TABS: 5.0000 mg | ORAL_TABLET | ORAL | 0 refills | Status: DC | PRN

## 2022-10-04 MED ORDER — AMOXICILLIN-POT CLAVULANATE 875-125 MG PO TABS
1.0000 | ORAL_TABLET | Freq: Two times a day (BID) | ORAL | Status: AC
  Administered 2022-10-04 – 2022-10-05 (×3): 1 via ORAL
  Filled 2022-10-04 (×3): qty 1

## 2022-10-04 MED ORDER — CARVEDILOL 3.125 MG PO TABS: 3.1250 mg | ORAL_TABLET | Freq: Two times a day (BID) | ORAL | 1 refills | Status: DC

## 2022-10-04 NOTE — Progress Notes (Signed)
Occupational Therapy Treatment Patient Details Name: [name] MRN: [id number] DOB: 12-26-1940 Today's Date: 10/04/2022   History of present illness patient is a 82 year old  male who presented secondary to marked exertional dyspnea with feeling that he was going to pass out.  On presentation, he was found to have evidence of a submassive pulmonary embolism.  PCCM was consulted and patient admitted to the ICU.  IR consult for the catheter directed thrombolysis for which she tolerated about 5 hours of treatment prior to development of gross hematuria.  Patient transition to heparin IV and urology consulted for management of gross hematuria.  Foley catheter placed with CBI initiated for management.  Echocardiogram confirmed right heart strain. Patient with continued bleeding on heparin IV, with significant blood loss anemia requiring cessation of heparin and consult to IR for an IVC filter, which was successfully placed on 8/18.PMH: BPH, hypertension, hyperlipidemia. CT right knee:DJD   OT comments  Patient was noted to have less pain in R knee pain impacting movement on this date compared to previous session. Patient reported dizziness with sitting EOB with BP of 152/80 mmhg and HR 75 bpm. Patients BP in standing was 116/86 mmhg with HR of 75. Patient's BP was taken after activity seated in recliner with BP of 150/58 mmhg. Patient was trialed without O2 sitting EOB with patient dropping to 84%. Patient was noted to need 3L/min with drop into 80s with movement with education provided on deep breathing and rest breaks.  Patient continues to have decreased functional activity tolerance, decreased endurance, decreased standing balance impacting participation in ADLs. Patient will benefit from continued inpatient follow up therapy, <3 hours/day. Patient's discharge plan remains appropriate at this time. OT will continue to follow acutely.        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; multiple lines, monitor O2 Restrictions Weight Bearing Restrictions: No       Mobility Bed Mobility Overal bed mobility: Needs Assistance Bed Mobility: Supine to Sit     Supine to sit: Contact guard     General bed mobility comments: extra time, no support required       Balance Overall balance assessment: Needs assistance Sitting-balance support: Feet supported, Single extremity supported, No upper extremity supported Sitting balance-Leahy Scale: Fair     Standing balance support: During functional activity, Reliant on assistive device for balance, Bilateral upper extremity supported Standing balance-Leahy Scale: Poor           ADL either performed or assessed with clinical judgement   ADL Overall ADL's : Needs assistance/impaired     Grooming: Sitting;Wash/dry face;Set up;Oral care Grooming Details (indicate cue type and reason): with increased time.                 Toilet Transfer: Minimal assistance;Rolling walker (2 wheels);Ambulation Toilet Transfer Details (indicate cue type and reason): was able to transtion into hallway and back into room.           General ADL Comments: patient reported decreased pain in R knee today with pain medications on board      Cognition Arousal:  Alert Behavior During Therapy: WFL for tasks assessed/performed Overall Cognitive Status: Within Functional Limits for tasks assessed                       Pertinent Vitals/  Pain       Pain Assessment Pain Assessment: 0-10 Pain Score: 4  Pain Location: R knee Pain Descriptors / Indicators: Sore Pain Intervention(s): Monitored during session, Repositioned, Premedicated before session         Frequency  Min 1X/week        Progress Toward Goals  OT Goals(current goals can now be found in the care plan section)  Progress towards OT goals: Progressing toward goals     Plan      Co-evaluation      Reason for Co-Treatment: To address functional/ADL transfers PT goals addressed during session: Mobility/safety with mobility OT goals addressed during session: ADL's and self-care      AM-PAC OT "6 Clicks" Daily Activity     Outcome Measure   Help from another person eating meals?: None Help from another person taking care of personal grooming?: A Little Help from another person toileting, which includes using toliet, bedpan, or urinal?: A Lot Help from another person bathing (including washing, rinsing, drying)?: A Lot Help from another person to put on and taking off regular upper body clothing?: A Little Help from another person to put on and taking off regular lower body clothing?: A Lot 6 Click Score: 16    End of Session Equipment Utilized During Treatment: Rolling walker (2 wheels)  OT Visit Diagnosis: Unsteadiness on feet (R26.81);Other abnormalities of gait and mobility (R26.89);Muscle weakness (generalized) (M62.81)   Activity Tolerance Patient limited by pain   Patient Left in bed;with call bell/phone within reach;with family/visitor present   Nurse Communication Mobility status        Time: 1610-9604 OT Time Calculation (min): 24 min  Charges: OT General Charges $OT Visit: 1 Visit OT Treatments $Therapeutic Activity: 8-22 mins  [name], MS Acute Rehabilitation Department Office# [phone]   Selinda Flavin 10/04/2022, 12:46 PM

## 2022-10-04 NOTE — Progress Notes (Signed)
ANTICOAGULATION CONSULT NOTE - Follow Up Consult  Pharmacy Consult for warfarin (holding heparin due to intermittent gross hematuria per MD) Indication: acute pulmonary embolus and DVT  Allergies  Allergen Reactions   Other Other (See Comments)    Mangos - caused a severe rash   Prozac [Fluoxetine Hcl]     Made him feel "loopy"   Sulfa Antibiotics Hives   Vicodin [Hydrocodone-Acetaminophen] Itching    Tolerates oxycodone   Erythromycin Rash    Many years ago an oral antibiotic caused a rash(wife and patient think it was erythromycin but they are not positive).    Patient Measurements: Height: 6' (182.9 cm) Weight: 81.3 kg (179 lb 3.7 oz) IBW/kg (Calculated) : 77.6 Heparin Dosing Weight: 77 kg  Vital Signs: Temp: 98.4 F (36.9 C) (08/27 0527) Temp Source: Oral (08/27 0527) BP: 130/74 (08/27 0527) Pulse Rate: 82 (08/27 0527)  Labs: Recent Labs    10/02/22 0256 10/02/22 1104 10/02/22 1855 10/03/22 0314 10/04/22 0531  HGB 7.3*  --  8.7* 8.5* 8.2*  HCT 23.6*  --  27.2* 27.1* 25.7*  PLT 393  --   --  450* 483*  LABPROT 23.0*  --   --  23.4* 24.9*  INR 2.0*  --   --  2.1* 2.2*  CREATININE  --  1.04  --   --   --     Estimated Creatinine Clearance: 61.1 mL/min (by C-G formula based on SCr of 1.04 mg/dL).  Assessment: Patient is an 82 y.o M with recent COVID infection who presented to the ED on 09/21/22 with c/o abdominal pain, HA, SOB and dizziness. He was subsequently found to have acute bilateral PE with RHS and RLE DVT. Catheter directed tPA initiated on 8/14, but stopped after 4 hours due to bleeding. IVC filter placed on 09/25/22. He was started on heparin drip, which was continued as bridging while starting on warfarin. Heparin was eventually stopped for hematuria and warfarin continued.   Significant events:  - 8/14 chest CT: acute BL PE with RHS. Ureteral stones. - 8/14 LE doppler: acute RLE  - 8/14: B catheter directed thrombolysis. RN reported bleeding bleeding  from penis, surgical site on his face, and hematuria. CCM consulted, stopped tpa @ 2128 (only got tPA for 4 hrs) - 8/15: heparin resumed with NO BOLUS - 8/18: heparin d/c'd due to bleeding and IVC filter placed. - 8/19: heparin resumed given worsening PE sx's (chest tightness, SOB) - 8/21: started on warfarin - 8/24: UFH/warfarin d/c'd due to gross hematuria - 8/25: hematuria resolved, restart warfarin but not UFH  Today, 10/04/2022: INR = 2.2 remains therapeutic CBC: Stable Confirmed with RN - no hematuria  Goal of Therapy:  INR 2-3   Plan:  Continue with warfarin 2.5 mg again today Daily INR Monitor for bleeding, particularly restart of hematuria  At this time, I would recommend discharging patient on warfarin 2.5 mg PO daily with appointment at clinic for INR check within 48 hours of discharge to guide further dosing.   Cindi Carbon, PharmD, BCPS Clinical Pharmacist 10/04/2022 10:59 AM

## 2022-10-04 NOTE — Progress Notes (Signed)
Physical Therapy Treatment Patient Details Name: [name] MRN: [id number] DOB: 12-18-1940 Today's Date: 10/04/2022   History of Present Illness patient is a 82 year old  male who presented secondary to marked exertional dyspnea with feeling that he was going to pass out.  On presentation, he was found to have evidence of a submassive pulmonary embolism.  PCCM was consulted and patient admitted to the ICU.  IR consult for the catheter directed thrombolysis for which she tolerated about 5 hours of treatment prior to development of gross hematuria.  Patient transition to heparin IV and urology consulted for management of gross hematuria.  Foley catheter placed with CBI initiated for management.  Echocardiogram confirmed right heart strain. Patient with continued bleeding on heparin IV, with significant blood loss anemia requiring cessation of heparin and consult to IR for an IVC filter, which was successfully placed on 8/18.PMH: BPH, hypertension, hyperlipidemia. CT right knee:DJD    PT Comments  The patient reports the right knee feels much better. Patient able to ambulate  x 30' using RW. Patient required O2 at 3L/. Continue progressive mobility. Patient BP 152/80-sup, 116/86 standing, post amb,150/98. Spo2 on RA 84%, immediate post amb 87% with return to 90%    If plan is discharge home, recommend the following: A little help with walking and/or transfers;A little help with bathing/dressing/bathroom;Assistance with cooking/housework;Help with stairs or ramp for entrance;Assist for transportation   Can travel by private vehicle     No  Equipment Recommendations  Rolling walker (2 wheels)    Recommendations for Other Services       Precautions / Restrictions Precautions Precautions: Other (comment);Fall Restrictions Weight Bearing Restrictions: No     Mobility  Bed Mobility   Bed Mobility: Supine to Sit     Supine to sit: Contact guard     General bed mobility comments:  extra time, no support required    Transfers Overall transfer level: Needs assistance Equipment used: Rolling walker (2 wheels) Transfers: Sit to/from Stand Sit to Stand: Min assist           General transfer comment: steady assist to rise from raised bned    Ambulation/Gait Ambulation/Gait assistance: Min assist, +2 safety/equipment Gait Distance (Feet): 30 Feet Assistive device: Rolling walker (2 wheels) Gait Pattern/deviations: Step-to pattern, Step-through pattern, Trunk flexed Gait velocity: decr     General Gait Details: Patient reported dizziness, , able to progress to ambulate  into hall   Stairs             Wheelchair Mobility     Tilt Bed    Modified Rankin (Stroke Patients Only)       Balance   Sitting-balance support: Feet supported, Single extremity supported, No upper extremity supported Sitting balance-Leahy Scale: Fair     Standing balance support: During functional activity, Reliant on assistive device for balance, Bilateral upper extremity supported Standing balance-Leahy Scale: Poor Standing balance comment: reports feeling leans backward                            Cognition Arousal: Alert Behavior During Therapy: WFL for tasks assessed/performed Overall Cognitive Status: Within Functional Limits for tasks assessed                                          Exercises      General Comments  Pertinent Vitals/Pain Pain Assessment Pain Score: 4  Pain Location: R knee Pain Descriptors / Indicators: Sore Pain Intervention(s): Monitored during session, Premedicated before session    Home Living                          Prior Function            PT Goals (current goals can now be found in the care plan section) Progress towards PT goals: Progressing toward goals    Frequency    Min 1X/week      PT Plan      Co-evaluation PT/OT/SLP Co-Evaluation/Treatment: Yes Reason  for Co-Treatment: To address functional/ADL transfers PT goals addressed during session: Mobility/safety with mobility OT goals addressed during session: ADL's and self-care      AM-PAC PT "6 Clicks" Mobility   Outcome Measure  Help needed turning from your back to your side while in a flat bed without using bedrails?: None Help needed moving from lying on your back to sitting on the side of a flat bed without using bedrails?: None Help needed moving to and from a bed to a chair (including a wheelchair)?: A Little Help needed standing up from a chair using your arms (e.g., wheelchair or bedside chair)?: A Little Help needed to walk in hospital room?: A Lot Help needed climbing 3-5 steps with a railing? : Total 6 Click Score: 17    End of Session Equipment Utilized During Treatment: Gait belt;Oxygen Activity Tolerance: Patient tolerated treatment well Patient left: in chair;with chair alarm set;with call bell/phone within reach Nurse Communication: Mobility status PT Visit Diagnosis: Other abnormalities of gait and mobility (R26.89);Difficulty in walking, not elsewhere classified (R26.2)     Time: 5284-1324 PT Time Calculation (min) (ACUTE ONLY): 24 min  Charges:    $Gait Training: 8-22 mins PT General Charges $$ ACUTE PT VISIT: 1 Visit                     Blanchard Kelch PT Acute Rehabilitation Services Office [phone] Weekend [phone]    Rada Hay 10/04/2022, 10:13 AM

## 2022-10-04 NOTE — TOC Progression Note (Signed)
Transition of Care [facility]) - Progression Note    Patient Details  Name: [name] MRN: [id number] Date of Birth: 1940/05/18  Transition of [facility]) CM/SW Contact  Otelia Santee, LCSW Phone Number: 10/04/2022, 2:13 PM  Clinical Narrative:    Met with pt to discuss recommendation for SNF placement. Pt states he does not want to go to SNF nor does he thinks he needs SNF. Pt shares he was able to ambulate today the best he has been able to since being in the hospital. He reports feeling that he can manage at home w/ [facility] through [place]. CSW reviewed level assistance required for ambulating. Pt continues to decline SNF placement.  Pt is agreeable to having RW and home O2 ordered. CSW will order DME once ambulatory sats note has been entered.      Barriers to Discharge: Continued Medical Work up  Expected Discharge Plan and Services                                               Social Determinants of Health (SDOH) Interventions SDOH Screenings   Food Insecurity: No Food Insecurity (09/22/2022)  Housing: Low Risk  (09/22/2022)  Transportation Needs: No Transportation Needs (09/22/2022)  Utilities: Not At Risk (09/22/2022)  Alcohol Screen: Low Risk  (05/06/2021)  Depression (PHQ2-9): Low Risk  (05/10/2022)  Financial Resource Strain: Low Risk  (05/06/2021)  Physical Activity: Insufficiently Active (05/06/2021)  Social Connections: Moderately Isolated (05/06/2021)  Stress: No Stress Concern Present (05/06/2021)  Tobacco Use: Low Risk  (09/24/2022)    Readmission Risk Interventions    09/30/2022    2:55 PM 09/23/2022    5:29 PM  Readmission Risk Prevention Plan  Post Dischage Appt  Complete  Medication Screening  Complete  Transportation Screening Complete Complete  PCP or Specialist Appt within 5-7 Days Complete   Home Care Screening Complete   Medication Review (RN CM) Referral to Pharmacy

## 2022-10-04 NOTE — Plan of Care (Signed)
Pt's O2 weaned down to 2.5L/min. Will continue to wean as tolerated. Pt on continuous pulse ox monitoring with tele. SpO2 remained >92% overnight with titration.    Problem: Education: Goal: Knowledge of General Education information will improve Description: Including pain rating scale, medication(s)/side effects and non-pharmacologic comfort measures Outcome: Progressing   Problem: Health Behavior/Discharge Planning: Goal: Ability to manage health-related needs will improve Outcome: Progressing   Problem: Clinical Measurements: Goal: Ability to maintain clinical measurements within normal limits will improve Outcome: Progressing Goal: Will remain free from infection Outcome: Progressing Goal: Diagnostic test results will improve Outcome: Progressing Goal: Respiratory complications will improve Outcome: Progressing Goal: Cardiovascular complication will be avoided Outcome: Progressing   Problem: Activity: Goal: Risk for activity intolerance will decrease Outcome: Progressing   Problem: Nutrition: Goal: Adequate nutrition will be maintained Outcome: Progressing   Problem: Coping: Goal: Level of anxiety will decrease Outcome: Progressing   Problem: Elimination: Goal: Will not experience complications related to bowel motility Outcome: Progressing Goal: Will not experience complications related to urinary retention Outcome: Progressing   Problem: Pain Managment: Goal: General experience of comfort will improve Outcome: Progressing   Problem: Safety: Goal: Ability to remain free from injury will improve Outcome: Progressing   Problem: Skin Integrity: Goal: Risk for impaired skin integrity will decrease Outcome: Progressing   Problem: Activity: Goal: Ability to return to baseline activity level will improve Outcome: Progressing

## 2022-10-04 NOTE — Discharge Summary (Signed)
Physician Discharge Summary  [name] EXH:[id number] DOB: Jun 30, 1940 DOA: 09/21/2022  PCP: Shirline Frees, NP  Admit date: 09/21/2022 Discharge date: 10/04/2022  Admitted From: Home Disposition:  Home  Recommendations for Outpatient Follow-up:  Follow up with PCP in 1-2 weeks Please obtain BMP/CBC in one week   Home Health:Yes Equipment/Devices:Oxygen  Discharge Condition:Stable  CODE STATUS:ull Diet recommendation: Heart Healthy  Brief/Interim Summary: 82 y.o. male past medical history of BPH, essential hypertension presents to the ED with marked exertional dyspnea and near syncope, 2D echo showed right heart strain was found to have a submassive PE admitted to the ICU, 2D echo showed right ventricular failure, IR consulted and performed direct thrombolysis, after 5 hours started developing gross hematuria, urology was consulted.  Foley catheter was placed with CBI initially.  IV heparin was stopped, IR was consulted as hematuria continue to worsen IVC filter was placed on 8/18/202   Discharge Diagnoses:  Principal Problem:   Pulmonary emboli (HCC) Active Problems:   Acute hypoxemic respiratory failure (HCC)   Premature atrial contractions   Melanoma of skin (HCC)   Basal cell carcinoma   AKI (acute kidney injury) (HCC)   Healthcare-associated pneumonia  Acute respiratory failure with hypoxia secondary to massive pulmonary embolism: 2D echo showed right ventricular failure and admitted to the ICU IR was consulted and direct thrombolysis was started he developed hematuria eventually had to be stopped along with heparin. IVC filter placed on 09/25/2022. Heparin was restarted on 09/26/2022 without no bolus no further hematuria. Due to the elevated co-pay of his Eliquis and Xarelto which was him on Coumadin and his INR became therapeutic he will go home on 2 L of oxygen.  Gross hematuria: Post direct thrombolysis, urology was consulted Foley was placed bladder was  irrigated. Urine clear. He was started on Flomax and urology recommended for him to go home with a Foley and will follow-up with them as an outpatient.  Severe sepsis secondarily to Healthcare associated pneumonia: He started spiking fevers had a mild elevated white blood cell count urine culture was negative. CT of the chest showed no infiltrate.  He was start empirically on IV vancomycin and cefepime he defervesced. He was also started on midodrine to help keep his blood pressure up he was fluid resuscitated. We were able to wean him off midodrine and he will continue oral Augmentin as an outpatient for total of 8 days of antibiotics.  Acute kidney injury: Likely due to sepsis: This resolved with IV fluids and midodrine. We were able to wean him off midodrine.  Thrombocytosis: Likely reactive now resolving.  Severe osteoarthritis of the right knee: CT of the knee was done that showed no acute fracture just some effusion orthopedic surgery was consulted recommended conservative management with pain control and physical therapy.   Acute blood loss anemia: Likely secondary to hematuria hemoglobin dropped 8 he was transfused 1 unit packed red blood cells after this his hemoglobin remained relatively stable.  Paroxysmal atrial fibrillation: Rate controlled on Coreg continue Coumadin.  Chronic pain/fibromyalgia: Continue Voltaren gel.  History of BPH: Continue Flomax.  Chronic pain: Continue Oxy.  Essential hypertension: He will resume Coreg in 2 to 3 days and will follow-up with PCP and lisinopril will be restarted as an outpatient as tolerated.  Hyperlipidemia: Continue statins. Discharge Instructions   Allergies as of 10/04/2022       Reactions   Other Other (See Comments)   Mangos - caused a severe rash   Prozac [fluoxetine Hcl]  Made him feel "loopy"   Sulfa Antibiotics Hives   Vicodin [hydrocodone-acetaminophen] Itching   Tolerates oxycodone   Erythromycin  Rash   Many years ago an oral antibiotic caused a rash(wife and patient think it was erythromycin but they are not positive).        Medication List     STOP taking these medications    Cranberry 1000 MG Caps   ibuprofen 600 MG tablet Commonly known as: ADVIL   lisinopril 10 MG tablet Commonly known as: ZESTRIL   ondansetron 4 MG disintegrating tablet Commonly known as: ZOFRAN-ODT       TAKE these medications    amoxicillin-clavulanate 875-125 MG tablet Commonly known as: AUGMENTIN Take 1 tablet by mouth every 12 (twelve) hours.   aspirin EC 81 MG tablet Take 1 tablet (81 mg total) by mouth daily.   atorvastatin 10 MG tablet Commonly known as: Lipitor Take 1 tablet (10 mg total) by mouth daily.   buPROPion 150 MG 24 hr tablet Commonly known as: WELLBUTRIN XL TAKE 1 TABLET BY MOUTH EVERY DAY   carvedilol 3.125 MG tablet Commonly known as: COREG TAKE 1 TABLET BY MOUTH TWICE A DAY   meclizine 25 MG tablet Commonly known as: ANTIVERT Take 1 tablet (25 mg total) by mouth 3 (three) times daily as needed for dizziness.   multivitamin tablet Take 1 tablet by mouth daily.   ondansetron 4 MG tablet Commonly known as: Zofran Take 1 tablet (4 mg total) by mouth every 8 (eight) hours as needed for nausea or vomiting.   OSTEO BI-FLEX ADV JOINT SHIELD PO Take 2 capsules by mouth daily.   oxyCODONE 5 MG immediate release tablet Commonly known as: Oxy IR/ROXICODONE Take 1 tablet (5 mg total) by mouth every 3 (three) hours as needed for breakthrough pain.   tamsulosin 0.4 MG Caps capsule Commonly known as: FLOMAX Take 1 capsule (0.4 mg total) by mouth daily.   warfarin 2.5 MG tablet Commonly known as: COUMADIN Take 1 tablet (2.5 mg total) by mouth daily.        Follow-up Information     [facility] Follow up.   Specialty: Home Health Services Why: Your home health has been set up with [facility]. The oiffice will call you with start of service  information. Contact information: [address] [place] [address] [phone]                Allergies  Allergen Reactions   Other Other (See Comments)    Mangos - caused a severe rash   Prozac [Fluoxetine Hcl]     Made him feel "loopy"   Sulfa Antibiotics Hives   Vicodin [Hydrocodone-Acetaminophen] Itching    Tolerates oxycodone   Erythromycin Rash    Many years ago an oral antibiotic caused a rash(wife and patient think it was erythromycin but they are not positive).    Consultations: Orthopedic surgery Urology Pulmonary and critical care   Procedures/Studies: CT KNEE RIGHT W CONTRAST  Result Date: 10/03/2022 CLINICAL DATA:  Knee pain, chronic, degenerative disease on xray (Age >= 5y) EXAM: CT OF THE RIGHT KNEE WITH CONTRAST TECHNIQUE: Multidetector CT imaging was performed following the standard protocol during bolus administration of intravenous contrast. RADIATION DOSE REDUCTION: This exam was performed according to the departmental dose-optimization program which includes automated exposure control, adjustment of the mA and/or kV according to patient size and/or use of iterative reconstruction technique. CONTRAST:  OMNIPAQUE IOHEXOL 300 MG/ML  SOLN COMPARISON:  X-ray 09/30/2022 FINDINGS: Bones/Joint/Cartilage No acute fracture. No dislocation. 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 Moderate-large knee joint effusion. Numerous intra-articular loose bodies, the majority of which are present in the suprapatellar pouch although there are several large ossified loose bodies inferior to the patella. Moderate-sized Baker's cyst. Ligaments Suboptimally assessed by CT. Muscles and Tendons No acute musculotendinous abnormality by CT. Soft tissues Mild subcutaneous  edema.  No organized fluid collections. IMPRESSION: 1. No acute osseous abnormality of the right knee. 2. 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 3. Moderate-large knee joint effusion with numerous intra-articular loose bodies. 4. Moderate-sized Baker's cyst. Electronically Signed   By: Duanne Guess D.O.   On: 10/03/2022 09:12   CT CHEST ABDOMEN PELVIS WO CONTRAST  Result Date: 10/01/2022 CLINICAL DATA:  Fever of unknown source.  Neutropenia. EXAM: CT CHEST, ABDOMEN AND PELVIS WITHOUT CONTRAST TECHNIQUE: Multidetector CT imaging of the chest, abdomen and pelvis was performed following the standard protocol without IV contrast. RADIATION DOSE REDUCTION: This exam was performed according to the departmental dose-optimization program which includes automated exposure control, adjustment of the mA and/or kV according to patient size and/or use of iterative reconstruction technique. COMPARISON:  09/21/2022 FINDINGS: CT CHEST FINDINGS Cardiovascular: New tiny bilateral pleural effusions. New atelectasis or consolidation in posterior left lower lobe. New area of ground-glass opacity in the anterior left upper lobe as well as scattered tiny less than 5 mm pulmonary nodules, consistent with infectious or inflammatory etiology. Mediastinum/Lymph Nodes: No masses or pathologically enlarged lymph nodes identified on this unenhanced exam. Lungs/Pleura: No evidence of infiltrate, mass, or pleural effusion. Musculoskeletal:  No suspicious bone lesions identified. CT ABDOMEN AND PELVIS FINDINGS Hepatobiliary: No masses visualized on this unenhanced exam. Gallbladder is unremarkable. No evidence of biliary ductal dilatation. Pancreas: No mass or inflammatory changes identified on this unenhanced exam. Spleen:  Within normal limits in size. Adrenals/Urinary Tract: Multiple small less than 1 cm renal calculi are again seen bilaterally 2 adjacent calculi remain in the distal left ureter measure 5 mm in 6 mm.  No evidence of hydroureteronephrosis. Foley catheter seen in the urinary bladder which is empty. Stomach/Bowel: No evidence of obstruction, inflammatory process, or abnormal fluid collections. A small right inguinal hernia is again seen containing several small bowel loops. No No evidence of bowel obstruction or strangulation. Three small epigastric and paraumbilical ventral hernias are again seen which contain only fat. Vascular/Lymphatic: No pathologically enlarged lymph nodes identified. No abdominal aortic aneurysm. IVC filter in appropriate position. Reproductive:  Stable mildly enlarged prostate. Other:  None. Musculoskeletal:  No suspicious bone lesions identified. IMPRESSION: New tiny bilateral pleural effusions, and left lower lobe atelectasis or consolidation. New ground-glass opacity in anterior left upper lobe, and scattered tiny less than 5 mm pulmonary nodules, consistent with infectious or inflammatory etiology. Bilateral nephrolithiasis. 2 adjacent calculi remain in distal left ureter, without hydroureteronephrosis. Stable small right inguinal hernia containing several small bowel loops. No evidence of bowel obstruction or strangulation. Stable small epigastric and paraumbilical ventral hernias which contain only fat. Stable mildly enlarged prostate. Electronically Signed   By: Danae Orleans M.D.   On: 10/01/2022 12:27   DG Chest Port 1 View  Result Date: 10/01/2022 CLINICAL DATA:  Shortness of breath EXAM: PORTABLE CHEST 1 VIEW COMPARISON:  09/26/2022 FINDINGS: Heart and mediastinal contours within normal limits. No confluent airspace opacities or effusions. No  acute bony abnormality. IMPRESSION: No active disease. Electronically Signed   By: Charlett Nose M.D.   On: 10/01/2022 01:40   DG Knee 1-2 Views Right  Result Date: 09/30/2022 CLINICAL DATA:  Right knee pain EXAM: RIGHT KNEE - 2 VIEW COMPARISON:  X-ray 02/15/2022 FINDINGS: Once again there is severe tricompartmental osteoarthritic  changes identified with osteophyte formation. Some joint space loss as well. Several well corticated densities are again identified with chronic erosive changes along the inferior aspect of the patella, multiple joint bodies. No significant joint effusion. IMPRESSION: No acute process. 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 Electronically Signed   By: Karen Kays M.D.   On: 09/30/2022 11:36   DG CHEST PORT 1 VIEW  Result Date: 09/26/2022 CLINICAL DATA:  Chest pain EXAM: PORTABLE CHEST 1 VIEW COMPARISON:  Chest x-ray 09/21/2022 FINDINGS: The heart size and mediastinal contours are within normal limits. Both lungs are clear. The visualized skeletal structures are unremarkable. IMPRESSION: No active disease. Electronically Signed   By: Darliss Cheney M.D.   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 He has been recovering on heparin but has developed complications with significant hematuria as well as bleeding from his temporal scalp resection site. He has had blood loss significant enough to require transfusion. Anticoagulation must be stopped. Therefore, he is a candidate for PE prophylaxis by caval interruption. He presents for placement of an IVC filter. EXAM: ULTRASOUND GUIDANCE FOR VASCULARACCESS IVC CATHETERIZATION AND VENOGRAM IVC FILTER INSERTION Interventional Radiologist:  Sterling Big, MD MEDICATIONS: None. ANESTHESIA/SEDATION: Fentanyl 50 mcg IV; Versed 1 mg IV Moderate Sedation Time:  10 minutes The patient's vital signs and level of consciousness were continuously monitored during the procedure by the interventional radiology nurse under my direct supervision. FLUOROSCOPY TIME:  Radiation exposure  index: 3 mGy reference air kerma COMPLICATIONS: None immediate. PROCEDURE: Informed written consent was obtained from the patient after a thorough discussion of the procedural risks, benefits and alternatives. All questions were addressed. Maximal Sterile Barrier Technique was utilized including caps, mask, sterile gowns, sterile gloves, sterile drape, hand hygiene and skin antiseptic. A timeout was performed prior to the initiation of the procedure. Maximal barrier sterile technique utilized including caps, mask, sterile gowns, sterile gloves, large sterile drape, hand hygiene, and Betadine prep. Under sterile condition and local anesthesia, right internal jugular venous access was performed with ultrasound. An ultrasound image was saved and sent to PACS. Over a guidewire, the IVC filter delivery sheath and inner dilator were advanced into the IVC just above the IVC bifurcation. Contrast injection was performed for an IVC venogram. Through the delivery sheath, a retrievable Bard Denali IVC filter was deployed below the level of the renal veins and above the IVC bifurcation. Limited post deployment venacavagram was performed. The delivery sheath was removed and hemostasis was obtained with manual compression. A dressing was placed. The patient tolerated the procedure well without immediate post procedural complication. FINDINGS: The IVC is patent. No evidence of thrombus, stenosis, or occlusion. No variant venous anatomy. Successful placement of the IVC filter below the level of the renal veins. IMPRESSION: Successful ultrasound and fluoroscopically guided placement of an infrarenal retrievable IVC filter via right jugular approach. PLAN: Due to patient related comorbidities and/or clinical necessity, this  IVC filter should be considered a permanent device. This patient will not be actively followed for future filter retrieval. Electronically Signed   By: Malachy Moan M.D.   On: 09/25/2022 12:50   IR  Angiogram Pulmonary Bilateral Selective  Result Date: 09/23/2022 INDICATION: 82 year old with submassive pulmonary embolism. Right heart strain. Plan for catheter directed thrombolysis of bilateral pulmonary emboli. EXAM: 1. Placement of bilateral pulmonary artery infusion catheters 2. Bilateral pulmonary angiography 3. Pulmonary artery pressure measurement 4. Ultrasound guidance for vascular access x 2 COMPARISON:  Chest CTA 09/21/2022 MEDICATIONS: Moderate sedation ANESTHESIA/SEDATION: Moderate (conscious) sedation was employed during this procedure. A total of Versed 0.5 mg and Fentanyl 25 mcg was administered intravenously by the radiology nurse. Total intra-service moderate Sedation Time: 54 minutes. The patient's level of consciousness and vital signs were monitored continuously by radiology nursing throughout the procedure under my direct supervision. FLUOROSCOPY: Radiation Exposure Index (as provided by the fluoroscopic device): 63 mGy Kerma CONTRAST:  10 mL Omnipaque 300 COMPLICATIONS: None immediate. TECHNIQUE: Informed written consent was obtained from the patient after a thorough discussion of the procedural risks, benefits and alternatives. All questions were addressed. A timeout was performed prior to the initiation of the procedure. Patient was placed supine. Ultrasound confirmed a patent right internal jugular vein. Ultrasound image was saved for fracture documentation. Right neck was prepped and draped in sterile fashion. Maximal barrier sterile technique was utilized including caps, mask, sterile gowns, sterile gloves, sterile drape, hand hygiene and skin antiseptic. Skin was anesthetized in the right neck with 1% lidocaine. Using ultrasound guidance, 21 gauge needle was directed into the right internal jugular vein and micropuncture dilator set was placed. Second micropuncture dilator set was placed into the right internal jugular vein using ultrasound guidance. Both micropuncture dilator sets  were upsized to a 6 [place] vascular sheath. A C2 catheter was advanced through 1 of the sheath and directed into the right side of the heart and main pulmonary artery. Pulmonary artery pressure was obtained. Catheter was advanced into the left pulmonary artery. Catheter was removed over a Rosen wire. C2 catheter was advanced through the other sheath into the right heart and right pulmonary artery. Catheter was advanced into the right interlobar artery region and contrast injection confirmed adequate placement within a right pulmonary artery. A 90 cm, 10 cm infusion length, UniFuse catheter was advanced over the Rosen wire and positioned in the right pulmonary artery. A second 90 cm, 10 cm infusion length, UniFuse catheter was advanced over the left pulmonary artery wire into the left pulmonary artery. Contrast injection confirmed adequate placement in left pulmonary artery. Occluding wires were placed in both infusion catheters. Both catheters were flushed with saline and tPA was started through both catheters. Both sheaths were sutured to skin. Bandage was placed over the right neck. FINDINGS: Main pulmonary artery pressure was 58/13 mmHg, mean 31. Right pulmonary artery infusion catheter tip terminated in a right lower lobe branch. Left pulmonary artery catheter tip extends into the left pulmonary artery. IMPRESSION: 1. Successful placement of bilateral pulmonary artery catheters for catheter-directed thrombolysis. 2. Elevated pulmonary artery pressure measuring 58/13 mm Hg, mean 31. Electronically Signed   By: Richarda Overlie M.D.   On: 09/23/2022 11:41   CT ABDOMEN PELVIS WO CONTRAST  Result Date: 09/22/2022 CLINICAL DATA:  Abdominal distension, rule out retroperitoneal bleed post pulmonary artery thrombolysis EXAM: CT ABDOMEN AND PELVIS WITHOUT CONTRAST TECHNIQUE: Multidetector CT imaging of the abdomen and pelvis was performed following the standard protocol without IV  contrast. RADIATION DOSE REDUCTION: This  exam was performed according to the departmental dose-optimization program which includes automated exposure control, adjustment of the mA and/or kV according to patient size and/or use of iterative reconstruction technique. COMPARISON:  the previous day's study FINDINGS: Lower chest: New subsegmental airspace consolidation in the dependent aspect of both lower lobes, left worse than right. Hepatobiliary: No focal liver abnormality is seen. No gallstones, gallbladder wall thickening, or biliary dilatation. Pancreas: Unremarkable. No pancreatic ductal dilatation or surrounding inflammatory changes. Spleen: Normal in size without focal abnormality. Adrenals/Urinary Tract: Adrenal glands are unremarkable. Kidneys are normal, without renal calculi, focal lesion, or hydronephrosis. Residual contrast in the collecting systems from previous administration. Urinary bladder decompressed by Foley catheter. Stomach/Bowel: Stomach decompressed. Small bowel is nondilated. A loop of small bowel protrudes into right inguinal hernia without obstruction or strangulation. Normal appendix. Colon is incompletely distended, with a few scattered distal descending diverticula; no adjacent inflammatory change. Vascular/Lymphatic: Mild scattered aortoiliac calcified plaque without aneurysm. Reproductive: Prostate enlargement Other: No retroperitoneal hematoma.  No ascites.  No free air. Musculoskeletal: Ventral and periumbilical hernias. Right inguinal hernia containing a loop of small bowel, without obstruction or strangulation. IMPRESSION: 1. No retroperitoneal hematoma. 2. Subsegmental bibasilar airspace consolidation, left worse than right. 3. Right inguinal hernia containing a loop of small bowel, without obstruction or strangulation. 4.  Aortic Atherosclerosis (ICD10-I70.0). Electronically Signed   By: Corlis Leak M.D.   On: 09/22/2022 17:08   IR US Guide Vasc Access Right  Result Date: 09/22/2022 INDICATION: 82 year old with  submassive pulmonary embolism. Right heart strain. Plan for catheter directed thrombolysis of bilateral pulmonary emboli. EXAM: 1. Placement of bilateral pulmonary artery infusion catheters 2. Bilateral pulmonary angiography 3. Pulmonary artery pressure measurement 4. Ultrasound guidance for vascular access x 2 COMPARISON:  Chest CTA 09/21/2022 MEDICATIONS: Moderate sedation ANESTHESIA/SEDATION: Moderate (conscious) sedation was employed during this procedure. A total of Versed 0.5 mg and Fentanyl 25 mcg was administered intravenously by the radiology nurse. Total intra-service moderate Sedation Time: 54 minutes. The patient's level of consciousness and vital signs were monitored continuously by radiology nursing throughout the procedure under my direct supervision. FLUOROSCOPY: Radiation Exposure Index (as provided by the fluoroscopic device): 63 mGy Kerma CONTRAST:  10 mL Omnipaque 300 COMPLICATIONS: None immediate. TECHNIQUE: Informed written consent was obtained from the patient after a thorough discussion of the procedural risks, benefits and alternatives. All questions were addressed. A timeout was performed prior to the initiation of the procedure. Patient was placed supine. Ultrasound confirmed a patent right internal jugular vein. Ultrasound image was saved for fracture documentation. Right neck was prepped and draped in sterile fashion. Maximal barrier sterile technique was utilized including caps, mask, sterile gowns, sterile gloves, sterile drape, hand hygiene and skin antiseptic. Skin was anesthetized in the right neck with 1% lidocaine. Using ultrasound guidance, 21 gauge needle was directed into the right internal jugular vein and micropuncture dilator set was placed. Second micropuncture dilator set was placed into the right internal jugular vein using ultrasound guidance. Both micropuncture dilator sets were upsized to a 6 [place] vascular sheath. A C2 catheter was advanced through 1 of the sheath  and directed into the right side of the heart and main pulmonary artery. Pulmonary artery pressure was obtained. Catheter was advanced into the left pulmonary artery. Catheter was removed over a Rosen wire. C2 catheter was advanced through the other sheath into the right heart and right pulmonary artery. Catheter was advanced into the right interlobar artery region and  contrast injection confirmed adequate placement within a right pulmonary artery. A 90 cm, 10 cm infusion length, UniFuse catheter was advanced over the Rosen wire and positioned in the right pulmonary artery. A second 90 cm, 10 cm infusion length, UniFuse catheter was advanced over the left pulmonary artery wire into the left pulmonary artery. Contrast injection confirmed adequate placement in left pulmonary artery. Occluding wires were placed in both infusion catheters. Both catheters were flushed with saline and tPA was started through both catheters. Both sheaths were sutured to skin. Bandage was placed over the right neck. FINDINGS: Main pulmonary artery pressure was 58/13 mmHg, mean 31. Right pulmonary artery infusion catheter tip terminated in a right lower lobe branch. Left pulmonary artery catheter tip extends into the left pulmonary artery. IMPRESSION: 1. Successful placement of bilateral pulmonary artery catheters for catheter-directed thrombolysis. 2. Elevated pulmonary artery pressure measuring 58/13 mm Hg, mean 31. Electronically Signed   By: Richarda Overlie M.D.   On: 09/22/2022 13:09   IR INFUSION THROMBOL ARTERIAL INITIAL (MS)  Result Date: 09/22/2022 INDICATION: 82 year old with submassive pulmonary embolism. Right heart strain. Plan for catheter directed thrombolysis of bilateral pulmonary emboli. EXAM: 1. Placement of bilateral pulmonary artery infusion catheters 2. Bilateral pulmonary angiography 3. Pulmonary artery pressure measurement 4. Ultrasound guidance for vascular access x 2 COMPARISON:  Chest CTA 09/21/2022 MEDICATIONS:  Moderate sedation ANESTHESIA/SEDATION: Moderate (conscious) sedation was employed during this procedure. A total of Versed 0.5 mg and Fentanyl 25 mcg was administered intravenously by the radiology nurse. Total intra-service moderate Sedation Time: 54 minutes. The patient's level of consciousness and vital signs were monitored continuously by radiology nursing throughout the procedure under my direct supervision. FLUOROSCOPY: Radiation Exposure Index (as provided by the fluoroscopic device): 63 mGy Kerma CONTRAST:  10 mL Omnipaque 300 COMPLICATIONS: None immediate. TECHNIQUE: Informed written consent was obtained from the patient after a thorough discussion of the procedural risks, benefits and alternatives. All questions were addressed. A timeout was performed prior to the initiation of the procedure. Patient was placed supine. Ultrasound confirmed a patent right internal jugular vein. Ultrasound image was saved for fracture documentation. Right neck was prepped and draped in sterile fashion. Maximal barrier sterile technique was utilized including caps, mask, sterile gowns, sterile gloves, sterile drape, hand hygiene and skin antiseptic. Skin was anesthetized in the right neck with 1% lidocaine. Using ultrasound guidance, 21 gauge needle was directed into the right internal jugular vein and micropuncture dilator set was placed. Second micropuncture dilator set was placed into the right internal jugular vein using ultrasound guidance. Both micropuncture dilator sets were upsized to a 6 [place] vascular sheath. A C2 catheter was advanced through 1 of the sheath and directed into the right side of the heart and main pulmonary artery. Pulmonary artery pressure was obtained. Catheter was advanced into the left pulmonary artery. Catheter was removed over a Rosen wire. C2 catheter was advanced through the other sheath into the right heart and right pulmonary artery. Catheter was advanced into the right interlobar artery  region and contrast injection confirmed adequate placement within a right pulmonary artery. A 90 cm, 10 cm infusion length, UniFuse catheter was advanced over the Rosen wire and positioned in the right pulmonary artery. A second 90 cm, 10 cm infusion length, UniFuse catheter was advanced over the left pulmonary artery wire into the left pulmonary artery. Contrast injection confirmed adequate placement in left pulmonary artery. Occluding wires were placed in both infusion catheters. Both catheters were flushed with saline and tPA  was started through both catheters. Both sheaths were sutured to skin. Bandage was placed over the right neck. FINDINGS: Main pulmonary artery pressure was 58/13 mmHg, mean 31. Right pulmonary artery infusion catheter tip terminated in a right lower lobe branch. Left pulmonary artery catheter tip extends into the left pulmonary artery. IMPRESSION: 1. Successful placement of bilateral pulmonary artery catheters for catheter-directed thrombolysis. 2. Elevated pulmonary artery pressure measuring 58/13 mm Hg, mean 31. Electronically Signed   By: Richarda Overlie M.D.   On: 09/22/2022 13:09   IR INFUSION THROMBOL ARTERIAL INITIAL (MS)  Result Date: 09/22/2022 INDICATION: 82 year old with submassive pulmonary embolism. Right heart strain. Plan for catheter directed thrombolysis of bilateral pulmonary emboli. EXAM: 1. Placement of bilateral pulmonary artery infusion catheters 2. Bilateral pulmonary angiography 3. Pulmonary artery pressure measurement 4. Ultrasound guidance for vascular access x 2 COMPARISON:  Chest CTA 09/21/2022 MEDICATIONS: Moderate sedation ANESTHESIA/SEDATION: Moderate (conscious) sedation was employed during this procedure. A total of Versed 0.5 mg and Fentanyl 25 mcg was administered intravenously by the radiology nurse. Total intra-service moderate Sedation Time: 54 minutes. The patient's level of consciousness and vital signs were monitored continuously by radiology nursing  throughout the procedure under my direct supervision. FLUOROSCOPY: Radiation Exposure Index (as provided by the fluoroscopic device): 63 mGy Kerma CONTRAST:  10 mL Omnipaque 300 COMPLICATIONS: None immediate. TECHNIQUE: Informed written consent was obtained from the patient after a thorough discussion of the procedural risks, benefits and alternatives. All questions were addressed. A timeout was performed prior to the initiation of the procedure. Patient was placed supine. Ultrasound confirmed a patent right internal jugular vein. Ultrasound image was saved for fracture documentation. Right neck was prepped and draped in sterile fashion. Maximal barrier sterile technique was utilized including caps, mask, sterile gowns, sterile gloves, sterile drape, hand hygiene and skin antiseptic. Skin was anesthetized in the right neck with 1% lidocaine. Using ultrasound guidance, 21 gauge needle was directed into the right internal jugular vein and micropuncture dilator set was placed. Second micropuncture dilator set was placed into the right internal jugular vein using ultrasound guidance. Both micropuncture dilator sets were upsized to a 6 [place] vascular sheath. A C2 catheter was advanced through 1 of the sheath and directed into the right side of the heart and main pulmonary artery. Pulmonary artery pressure was obtained. Catheter was advanced into the left pulmonary artery. Catheter was removed over a Rosen wire. C2 catheter was advanced through the other sheath into the right heart and right pulmonary artery. Catheter was advanced into the right interlobar artery region and contrast injection confirmed adequate placement within a right pulmonary artery. A 90 cm, 10 cm infusion length, UniFuse catheter was advanced over the Rosen wire and positioned in the right pulmonary artery. A second 90 cm, 10 cm infusion length, UniFuse catheter was advanced over the left pulmonary artery wire into the left pulmonary artery.  Contrast injection confirmed adequate placement in left pulmonary artery. Occluding wires were placed in both infusion catheters. Both catheters were flushed with saline and tPA was started through both catheters. Both sheaths were sutured to skin. Bandage was placed over the right neck. FINDINGS: Main pulmonary artery pressure was 58/13 mmHg, mean 31. Right pulmonary artery infusion catheter tip terminated in a right lower lobe branch. Left pulmonary artery catheter tip extends into the left pulmonary artery. IMPRESSION: 1. Successful placement of bilateral pulmonary artery catheters for catheter-directed thrombolysis. 2. Elevated pulmonary artery pressure measuring 58/13 mm Hg, mean 31. Electronically Signed   By:  Richarda Overlie M.D.   On: 09/22/2022 13:09   IR THROMB F/U EVAL ART/VEN FINAL DAY (MS)  Result Date: 09/22/2022 Assunta Found, RT     09/22/2022  9:55 AM At the patients bedside, bilateral pulmonary artery infusion catheters were accessed and pulled back about 10 cm for a pressure reading. Pulmonary pressure read 52/13 with a mean of 27. Bilateral pulmonary artery infusion catheters were removed following the removal of bilateral 6 Fr sheaths. Patients floor nurse held pressure and verbalized that an occlusive dressing would be placed.   VAS [place] LOWER EXTREMITY VENOUS (DVT)  Result Date: 09/22/2022  Lower Venous DVT Study Patient Name:  EMRY BLASE  Date of Exam:   09/21/2022 Medical Rec #: [id number]          Accession #:    [id number] Date of Birth: 03/30/1940           Patient Gender: M Patient Age:   32 years Exam Location:  [facility] Procedure:      VAS [place] LOWER EXTREMITY VENOUS (DVT) Referring Phys: Zenia Resides --------------------------------------------------------------------------------  Indications: Pulmonary embolism.  Risk Factors: Confirmed PE. Anticoagulation: Heparin. Comparison Study: No prior studies. Performing Technologist: Chanda Busing RVT  Examination  Guidelines: A complete evaluation includes B-mode imaging, spectral Doppler, color Doppler, and power Doppler as needed of all accessible portions of each vessel. Bilateral testing is considered an integral part of a complete examination. Limited examinations for reoccurring indications may be performed as noted. The reflux portion of the exam is performed with the patient in reverse Trendelenburg.  +---------+---------------+---------+-----------+----------+--------------+ RIGHT    CompressibilityPhasicitySpontaneityPropertiesThrombus Aging +---------+---------------+---------+-----------+----------+--------------+ CFV      Full           Yes      Yes                                 +---------+---------------+---------+-----------+----------+--------------+ SFJ      Full                                                        +---------+---------------+---------+-----------+----------+--------------+ FV Prox  Partial        No       No                   Acute          +---------+---------------+---------+-----------+----------+--------------+ FV Mid   Full                                                        +---------+---------------+---------+-----------+----------+--------------+ FV DistalFull                                                        +---------+---------------+---------+-----------+----------+--------------+ PFV      Full                                                        +---------+---------------+---------+-----------+----------+--------------+  POP      Full           Yes      Yes                                 +---------+---------------+---------+-----------+----------+--------------+ PTV      Full                                                        +---------+---------------+---------+-----------+----------+--------------+ PERO     Full                                                         +---------+---------------+---------+-----------+----------+--------------+   +---------+---------------+---------+-----------+----------+--------------+ LEFT     CompressibilityPhasicitySpontaneityPropertiesThrombus Aging +---------+---------------+---------+-----------+----------+--------------+ CFV      Full           Yes      No                                  +---------+---------------+---------+-----------+----------+--------------+ SFJ      Full                                                        +---------+---------------+---------+-----------+----------+--------------+ FV Prox  Full                                                        +---------+---------------+---------+-----------+----------+--------------+ FV Mid   Full                                                        +---------+---------------+---------+-----------+----------+--------------+ FV DistalFull                                                        +---------+---------------+---------+-----------+----------+--------------+ PFV      Full                                                        +---------+---------------+---------+-----------+----------+--------------+ POP      Full           Yes      Yes                                 +---------+---------------+---------+-----------+----------+--------------+  PTV      Full                                                        +---------+---------------+---------+-----------+----------+--------------+ PERO     Full                                                        +---------+---------------+---------+-----------+----------+--------------+     Summary: RIGHT: - Findings consistent with acute deep vein thrombosis involving the right femoral vein. - No cystic structure found in the popliteal fossa.  LEFT: - There is no evidence of deep vein thrombosis in the lower extremity.  - No cystic structure found in the  popliteal fossa.  *See table(s) above for measurements and observations. Electronically signed by Sherald Hess MD on 09/22/2022 at 9:46:41 AM.    Final    CT HEAD W & WO CONTRAST ( )  Result Date: 09/21/2022 CLINICAL DATA:  Melanoma.  Pulmonary embolus. EXAM: CT HEAD WITHOUT AND WITH CONTRAST TECHNIQUE: Contiguous axial images were obtained from the base of the skull through the vertex without and with intravenous contrast. RADIATION DOSE REDUCTION: This exam was performed according to the departmental dose-optimization program which includes automated exposure control, adjustment of the mA and/or kV according to patient size and/or use of iterative reconstruction technique. CONTRAST:  70mL OMNIPAQUE IOHEXOL 300 MG/ML  SOLN COMPARISON:  CT head without contrast and MR head without contrast 08/10/2019 FINDINGS: Brain: No acute infarct, hemorrhage, or mass lesion is present. Scattered subcortical white matter hypoattenuation is present bilaterally. A Deep brain nuclei are within normal limits. The ventricles are of normal size. No significant extraaxial fluid collection is present. Vascular: Minimal calcifications are present within the cavernous internal carotid arteries. Contrast is present within the blood vessels. No asymmetric hyperdensity is present. Skull: Calvarium is intact. No focal lytic or blastic lesions are present. No significant extracranial soft tissue lesion is present. Sinuses/Orbits: The paranasal sinuses and mastoid air cells are clear. The globes and orbits are within normal limits. IMPRESSION: 1. No acute or focal lesion to suggest metastatic disease. 2. No acute hemorrhage. 3. Scattered subcortical white matter hypoattenuation bilaterally. 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 Electronically Signed   By: Marin Roberts M.D.   On: 09/21/2022 14:42   ECHOCARDIOGRAM COMPLETE  Result Date: 09/21/2022    ECHOCARDIOGRAM REPORT   Patient Name:    ROBLEY DUTTON Date of Exam: 09/21/2022 Medical Rec #:  [id number]         Height:       72.0 in Accession #:    [id number]        Weight:       168.0 lb Date of Birth:  Jun 26, 1940          BSA:          1.978 m Patient Age:    82 years          BP:           129/94 mmHg Patient Gender: M                 HR:  82 bpm. Exam Location:  Inpatient Procedure: 2D Echo, Cardiac Doppler and Color Doppler Indications:    Pulmonary Embolus I26.09  History:        Patient has prior history of Echocardiogram examinations, most                 recent 04/02/2015. Risk Factors:Hypertension.  Sonographer:    Harriette Bouillon RDCS Referring Phys: [id number] ADEWALE A OLALERE IMPRESSIONS  1. Left ventricular ejection fraction, by estimation, is 50 to 55%. The left ventricle has low normal function. The left ventricle has no regional wall motion abnormalities. There is mild left ventricular hypertrophy. Left ventricular diastolic parameters are consistent with Grade I diastolic dysfunction (impaired relaxation).  2. Right ventricular systolic function is severely reduced. The right ventricular size is moderately enlarged. There is severely elevated pulmonary artery systolic pressure.  3. Right atrial size was severely dilated.  4. The mitral valve is normal in structure. Mild mitral valve regurgitation. No evidence of mitral stenosis.  5. The aortic valve is tricuspid. Aortic valve regurgitation is not visualized. No aortic stenosis is present.  6. The inferior vena cava is dilated in size with <50% respiratory variability, suggesting right atrial pressure of 15 mmHg. Comparison(s): No prior Echocardiogram. FINDINGS  Left Ventricle: Left ventricular ejection fraction, by estimation, is 50 to 55%. The left ventricle has low normal function. The left ventricle has no regional wall motion abnormalities. The left ventricular internal cavity size was normal in size. There is mild left ventricular hypertrophy. Left ventricular diastolic  parameters are consistent with Grade I diastolic dysfunction (impaired relaxation). Right Ventricle: The right ventricular size is moderately enlarged. Right ventricular systolic function is severely reduced. There is severely elevated pulmonary artery systolic pressure. The tricuspid regurgitant velocity is 3.37 m/s, and with an assumed right atrial pressure of 15 mmHg, the estimated right ventricular systolic pressure is 60.4 mmHg. Left Atrium: Left atrial size was normal in size. Right Atrium: Right atrial size was severely dilated. Pericardium: There is no evidence of pericardial effusion. Mitral Valve: The mitral valve is normal in structure. Mild mitral valve regurgitation. No evidence of mitral valve stenosis. Tricuspid Valve: The tricuspid valve is normal in structure. Tricuspid valve regurgitation is mild . No evidence of tricuspid stenosis. Aortic Valve: The aortic valve is tricuspid. Aortic valve regurgitation is not visualized. No aortic stenosis is present. Pulmonic Valve: The pulmonic valve was normal in structure. Pulmonic valve regurgitation is mild. No evidence of pulmonic stenosis. Aorta: The aortic root is normal in size and structure. Venous: The inferior vena cava is dilated in size with less than 50% respiratory variability, suggesting right atrial pressure of 15 mmHg. IAS/Shunts: No atrial level shunt detected by color flow Doppler.  LEFT VENTRICLE PLAX 2D LVIDd:         4.80 cm   Diastology LVIDs:         4.00 cm   LV e' medial:  3.92 cm/s LV PW:         1.20 cm   LV e' lateral: 8.59 cm/s LV IVS:        1.20 cm LVOT diam:     2.20 cm LV SV:         56 LV SV Index:   28 LVOT Area:     3.80 cm  RIGHT VENTRICLE            IVC RV S prime:     6.42 cm/s  IVC diam: 2.90 cm TAPSE (M-mode): 1.4 cm  LEFT ATRIUM           Index        RIGHT ATRIUM           Index LA diam:      3.60 cm 1.82 cm/m   RA Area:     30.90 cm LA Vol (A2C): 56.5 ml 28.56 ml/m  RA Volume:   126.00 ml 63.69 ml/m LA Vol (A4C):  31.3 ml 15.82 ml/m  AORTIC VALVE             PULMONIC VALVE LVOT Vmax:   82.30 cm/s  PR End Diast Vel: 1.43 msec LVOT Vmean:  54.800 cm/s LVOT VTI:    0.147 m  AORTA Ao Root diam: 3.70 cm Ao Asc diam:  3.80 cm TRICUSPID VALVE TR Peak grad:   45.4 mmHg TR Vmax:        337.00 cm/s  SHUNTS Systemic VTI:  0.15 m Systemic Diam: 2.20 cm Olga Millers MD Electronically signed by Olga Millers MD Signature Date/Time: 09/21/2022/12:33:12 PM    Final    CT ABDOMEN PELVIS W CONTRAST  Result Date: 09/21/2022 CLINICAL DATA:  Abdominal pain, dizziness, and headache. Recent diagnosis COVID with new O2 requirement EXAM: CT ANGIOGRAPHY CHEST CT ABDOMEN AND PELVIS WITH CONTRAST TECHNIQUE: Multidetector CT imaging of the chest was performed using the standard protocol during bolus administration of intravenous contrast. Multiplanar CT image reconstructions and MIPs were obtained to evaluate the vascular anatomy. Multidetector CT imaging of the abdomen and pelvis was performed using the standard protocol during bolus administration of intravenous contrast. RADIATION DOSE REDUCTION: This exam was performed according to the departmental dose-optimization program which includes automated exposure control, adjustment of the mA and/or kV according to patient size and/or use of iterative reconstruction technique. CONTRAST:  OMNIPAQUE IOHEXOL 350 MG/ML SOLN COMPARISON:  Chest radiograph dated 09/21/2022, CT abdomen and pelvis dated 09/14/2019 FINDINGS: CTA CHEST FINDINGS Cardiovascular: The study is high quality for the evaluation of pulmonary embolism. Filling defects within the distal main pulmonary arteries bilaterally extending into all lobes. Main pulmonary artery measures 3.5 cm. Right ventricle to left ventricle ratio > 1. No significant pericardial fluid/thickening. Mediastinum/Nodes: Imaged thyroid gland without nodules meeting criteria for imaging follow-up by size. Normal esophagus. No pathologically enlarged axillary,  supraclavicular, mediastinal, or hilar lymph nodes. Lungs/Pleura: The central airways are patent. No focal consolidation. No pneumothorax. No pleural effusion. Musculoskeletal: No acute or abnormal lytic or blastic osseous lesions. Multilevel degenerative changes of the thoracic spine. Review of the MIP images confirms the above findings. CT ABDOMEN and PELVIS FINDINGS Hepatobiliary: No focal hepatic lesions. No intra or extrahepatic biliary ductal dilation. Normal gallbladder. Pancreas: No focal lesions or main ductal dilation. Unchanged focus of coarse calcification in the pancreatic tail Spleen: Normal in size without focal abnormality. Adrenals/Urinary Tract: No adrenal nodules. No suspicious renal mass or hydronephrosis. Two distal left ureteral stones measuring up to 6 mm. Multiple additional bilateral nonobstructing stones. No focal bladder wall thickening. Stomach/Bowel: Normal appearance of the stomach. Small diverticulum arising from the second portion. No evidence of bowel wall thickening, distention, or inflammatory changes. Normal appendix. Vascular/Lymphatic: Aortic atherosclerosis. No enlarged abdominal or pelvic lymph nodes. 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 Other: No free fluid, fluid collection, or free air. Musculoskeletal: No acute or abnormal lytic or blastic osseous lesions. Multilevel degenerative changes of the lumbar spine. Unchanged small right inguinal hernia contains a loop of nonobstructed small bowel. Unchanged multiple moderate midline anterior abdominal fat  containing hernias. IMPRESSION: 1. Bilateral pulmonary emboli within the distal main pulmonary arteries extending into all lobes with CT findings of right heart strain. 2. Two distal left ureteral stones measuring up to 6 mm. No hydronephrosis. Multiple additional bilateral nonobstructing renal stones. 3. Unchanged small right inguinal hernia contains a loop of nonobstructed small bowel and  multiple moderate midline anterior abdominal fat containing hernias. 4. 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 5. Aortic Atherosclerosis (ICD10-I70.0). Critical Value/emergent results were called by telephone at the time of interpretation on 09/21/2022 at 10:55 am to provider Pricilla Loveless , who verbally acknowledged these results. Electronically Signed   By: Agustin Cree M.D.   On: 09/21/2022 11:00   CT Angio Chest PE W and/or Wo Contrast  Result Date: 09/21/2022 CLINICAL DATA:  Abdominal pain, dizziness, and headache. Recent diagnosis COVID with new O2 requirement EXAM: CT ANGIOGRAPHY CHEST CT ABDOMEN AND PELVIS WITH CONTRAST TECHNIQUE: Multidetector CT imaging of the chest was performed using the standard protocol during bolus administration of intravenous contrast. Multiplanar CT image reconstructions and MIPs were obtained to evaluate the vascular anatomy. Multidetector CT imaging of the abdomen and pelvis was performed using the standard protocol during bolus administration of intravenous contrast. RADIATION DOSE REDUCTION: This exam was performed according to the departmental dose-optimization program which includes automated exposure control, adjustment of the mA and/or kV according to patient size and/or use of iterative reconstruction technique. CONTRAST:  OMNIPAQUE IOHEXOL 350 MG/ML SOLN COMPARISON:  Chest radiograph dated 09/21/2022, CT abdomen and pelvis dated 09/14/2019 FINDINGS: CTA CHEST FINDINGS Cardiovascular: The study is high quality for the evaluation of pulmonary embolism. Filling defects within the distal main pulmonary arteries bilaterally extending into all lobes. Main pulmonary artery measures 3.5 cm. Right ventricle to left ventricle ratio > 1. No significant pericardial fluid/thickening. Mediastinum/Nodes: Imaged thyroid gland without nodules meeting criteria for imaging follow-up by size. Normal esophagus. No pathologically enlarged axillary, supraclavicular,  mediastinal, or hilar lymph nodes. Lungs/Pleura: The central airways are patent. No focal consolidation. No pneumothorax. No pleural effusion. Musculoskeletal: No acute or abnormal lytic or blastic osseous lesions. Multilevel degenerative changes of the thoracic spine. Review of the MIP images confirms the above findings. CT ABDOMEN and PELVIS FINDINGS Hepatobiliary: No focal hepatic lesions. No intra or extrahepatic biliary ductal dilation. Normal gallbladder. Pancreas: No focal lesions or main ductal dilation. Unchanged focus of coarse calcification in the pancreatic tail Spleen: Normal in size without focal abnormality. Adrenals/Urinary Tract: No adrenal nodules. No suspicious renal mass or hydronephrosis. Two distal left ureteral stones measuring up to 6 mm. Multiple additional bilateral nonobstructing stones. No focal bladder wall thickening. Stomach/Bowel: Normal appearance of the stomach. Small diverticulum arising from the second portion. No evidence of bowel wall thickening, distention, or inflammatory changes. Normal appendix. Vascular/Lymphatic: Aortic atherosclerosis. No enlarged abdominal or pelvic lymph nodes. 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 Other: No free fluid, fluid collection, or free air. Musculoskeletal: No acute or abnormal lytic or blastic osseous lesions. Multilevel degenerative changes of the lumbar spine. Unchanged small right inguinal hernia contains a loop of nonobstructed small bowel. Unchanged multiple moderate midline anterior abdominal fat containing hernias. IMPRESSION: 1. Bilateral pulmonary emboli within the distal main pulmonary arteries extending into all lobes with CT findings of right heart strain. 2. Two distal left ureteral stones measuring up to 6 mm. No hydronephrosis. Multiple additional bilateral nonobstructing renal stones. 3. Unchanged small right inguinal hernia contains a loop of nonobstructed small bowel and multiple moderate  midline anterior abdominal fat containing hernias. 4. 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 5. Aortic Atherosclerosis (ICD10-I70.0). Critical Value/emergent results were called by telephone at the time of interpretation on 09/21/2022 at 10:55 am to provider Pricilla Loveless , who verbally acknowledged these results. Electronically Signed   By: Agustin Cree M.D.   On: 09/21/2022 11:00   DG Chest Portable 1 View  Result Date: 09/21/2022 CLINICAL DATA:  Hypoxia. EXAM: PORTABLE CHEST 1 VIEW COMPARISON:  09/30/2019. FINDINGS: Clear lungs. Stable cardiac and mediastinal contours with tortuosity of the thoracic aorta. No pleural effusion or pneumothorax. Visualized bones and upper abdomen are unremarkable. IMPRESSION: No evidence of acute cardiopulmonary disease. Electronically Signed   By: Orvan Falconer M.D.   On: 09/21/2022 09:25   (Echo, Carotid, EGD, Colonoscopy, ERCP)    Subjective: Feels great no complaints  Discharge Exam: Vitals:   10/04/22 0120 10/04/22 0527  BP: 115/75 130/74  Pulse: 69 82  Resp:  18  Temp: 98.7 F (37.1 C) 98.4 F (36.9 C)  SpO2: 97% 97%   Vitals:   10/04/22 0100 10/04/22 0120 10/04/22 0500 10/04/22 0527  BP:  115/75  130/74  Pulse:  69  82  Resp: 20   18  Temp:  98.7 F (37.1 C)  98.4 F (36.9 C)  TempSrc:  Oral  Oral  SpO2:  97%  97%  Weight:   81.3 kg   Height:        General: Pt is alert, awake, not in acute distress Cardiovascular: RRR, S1/S2 +, no rubs, no gallops Respiratory: CTA bilaterally, no wheezing, no rhonchi Abdominal: Soft, NT, ND, bowel sounds + Extremities: no edema, no cyanosis    The results of significant diagnostics from this hospitalization (including imaging, microbiology, ancillary and laboratory) are listed below for reference.     Microbiology: Recent Results (from the past 240 hour(s))  Culture, blood (Routine X 2) w Reflex to ID Panel     Status: None (Preliminary result)   Collection Time: 09/30/22  10:55 AM   Specimen: BLOOD  Result Value Ref Range Status   Specimen Description   Final    BLOOD BLOOD LEFT ARM AEROBIC BOTTLE ONLY ANAEROBIC BOTTLE ONLY Performed at [facility], [address], [place], [place] [address]    Special Requests   Final    BOTTLES DRAWN AEROBIC AND ANAEROBIC Blood Culture adequate volume Performed at [facility], [address] [place], [place] [address]    Culture   Final    NO GROWTH 3 DAYS Performed at [facility], [address], [place], [place] [address]    Report Status PENDING  Incomplete  Culture, Urine (Do not remove urinary catheter, catheter placed by urology or difficult to place)     Status: None   Collection Time: 09/30/22 10:58 AM   Specimen: Urine, Catheterized  Result Value Ref Range Status   Specimen Description   Final    URINE, CATHETERIZED Performed at [facility], [address] [place], [place] [address]    Special Requests   Final    NONE Performed at [facility], [address] [place], [place] [address]    Culture   Final    NO GROWTH Performed at [facility], [address] [place], [place] [address]    Report Status 10/01/2022 FINAL  Final  Culture, blood (Routine X 2) w Reflex to ID Panel     Status:  None (Preliminary result)   Collection Time: 09/30/22 10:59 AM   Specimen: BLOOD  Result Value Ref Range Status   Specimen Description   Final    BLOOD BLOOD RIGHT ARM AEROBIC BOTTLE ONLY ANAEROBIC BOTTLE ONLY Performed at [facility], [address] [place], [place] [address]    Special Requests   Final    BOTTLES DRAWN AEROBIC AND ANAEROBIC Blood Culture adequate volume Performed at [facility], [address], [place], [place] [address]    Culture   Final    NO GROWTH 3 DAYS Performed at [facility], [address], [place], [place] [address]    Report Status  PENDING  Incomplete  Respiratory (~20 pathogens) panel by PCR     Status: None   Collection Time: 10/01/22  8:08 AM   Specimen: Nasopharyngeal Swab; Respiratory  Result Value Ref Range Status   Adenovirus NOT DETECTED NOT DETECTED Final   Coronavirus 229E NOT DETECTED NOT DETECTED Final    Comment: (NOTE) The Coronavirus on the Respiratory Panel, DOES NOT test for the novel  Coronavirus (2019 nCoV)    Coronavirus HKU1 NOT DETECTED NOT DETECTED Final   Coronavirus NL63 NOT DETECTED NOT DETECTED Final   Coronavirus OC43 NOT DETECTED NOT DETECTED Final   Metapneumovirus NOT DETECTED NOT DETECTED Final   Rhinovirus / Enterovirus NOT DETECTED NOT DETECTED Final   Influenza A NOT DETECTED NOT DETECTED Final   Influenza B NOT DETECTED NOT DETECTED Final   Parainfluenza Virus 1 NOT DETECTED NOT DETECTED Final   Parainfluenza Virus 2 NOT DETECTED NOT DETECTED Final   Parainfluenza Virus 3 NOT DETECTED NOT DETECTED Final   Parainfluenza Virus 4 NOT DETECTED NOT DETECTED Final   Respiratory Syncytial Virus NOT DETECTED NOT DETECTED Final   Bordetella pertussis NOT DETECTED NOT DETECTED Final   Bordetella Parapertussis NOT DETECTED NOT DETECTED Final   Chlamydophila pneumoniae NOT DETECTED NOT DETECTED Final   Mycoplasma pneumoniae NOT DETECTED NOT DETECTED Final    Comment: Performed at [facility], [address] [place], [place] [address]     Labs: BNP (last 3 results) Recent Labs    09/23/22 0307 09/24/22 0224 09/25/22 0323  BNP 626.8* 1,139.2* 457.0*   Basic Metabolic Panel: Recent Labs  Lab 09/28/22 0254 09/30/22 1055 09/30/22 2302 10/01/22 0015 10/02/22 1104  NA 133* 129*  --  130* 133*  K 4.0 4.6  --  4.5 4.4  CL 99 97*  --  103 106  CO2 26 26  --  22 17*  GLUCOSE 96 104*  --  103* 90  BUN 13 21  --  26* 24*  CREATININE 0.94 1.40*  --  1.41* 1.04  CALCIUM 8.0* 8.2*  --  7.6* 8.2*  MG  --   --  2.1  --   --   PHOS  --   --  3.2  --   --    Liver Function  Tests: Recent Labs  Lab 09/28/22 0254  AST 61*  ALT 57*  ALKPHOS 136*  BILITOT 0.9  PROT 4.9*  ALBUMIN 2.6*   No results for input(s): "LIPASE", "AMYLASE" in the last 168 hours. No results for input(s): "AMMONIA" in the last 168 hours. CBC: Recent Labs  Lab 09/30/22 0515 10/01/22 0015 10/01/22 1026 10/02/22 0256 10/02/22 1855 10/03/22 0314 10/04/22 0531  WBC 9.7 10.5  --  9.6  --  9.2 7.7  NEUTROABS  --   --  6.1  --   --   --   --  HGB 8.7* 8.2*  --  7.3* 8.7* 8.5* 8.2*  HCT 26.9* 25.6*  --  23.6* 27.2* 27.1* 25.7*  MCV 98.2 99.2  --  102.2*  --  98.9 98.1  PLT 433* 418*  --  393  --  450* 483*   Cardiac Enzymes: No results for input(s): "CKTOTAL", "CKMB", "CKMBINDEX", "TROPONINI" in the last 168 hours. BNP: Invalid input(s): "POCBNP" CBG: No results for input(s): "GLUCAP" in the last 168 hours. D-Dimer No results for input(s): "DDIMER" in the last 72 hours. Hgb A1c No results for input(s): "HGBA1C" in the last 72 hours. Lipid Profile No results for input(s): "CHOL", "HDL", "LDLCALC", "TRIG", "CHOLHDL", "LDLDIRECT" in the last 72 hours. Thyroid function studies No results for input(s): "TSH", "T4TOTAL", "T3FREE", "THYROIDAB" in the last 72 hours.  Invalid input(s): "FREET3" Anemia work up No results for input(s): "VITAMINB12", "FOLATE", "FERRITIN", "TIBC", "IRON", "RETICCTPCT" in the last 72 hours. Urinalysis    Component Value Date/Time   COLORURINE RED (A) 09/30/2022 1058   APPEARANCEUR TURBID (A) 09/30/2022 1058   LABSPEC  09/30/2022 1058    TEST NOT REPORTED DUE TO COLOR INTERFERENCE OF URINE PIGMENT   PHURINE  09/30/2022 1058    TEST NOT REPORTED DUE TO COLOR INTERFERENCE OF URINE PIGMENT   GLUCOSEU (A) 09/30/2022 1058    TEST NOT REPORTED DUE TO COLOR INTERFERENCE OF URINE PIGMENT   HGBUR (A) 09/30/2022 1058    TEST NOT REPORTED DUE TO COLOR INTERFERENCE OF URINE PIGMENT   BILIRUBINUR (A) 09/30/2022 1058    TEST NOT REPORTED DUE TO COLOR  INTERFERENCE OF URINE PIGMENT   BILIRUBINUR negative 08/30/2021 1402   KETONESUR (A) 09/30/2022 1058    TEST NOT REPORTED DUE TO COLOR INTERFERENCE OF URINE PIGMENT   PROTEINUR (A) 09/30/2022 1058    TEST NOT REPORTED DUE TO COLOR INTERFERENCE OF URINE PIGMENT   UROBILINOGEN 0.2 08/30/2021 1402   UROBILINOGEN 0.2 10/05/2012 2309   NITRITE (A) 09/30/2022 1058    TEST NOT REPORTED DUE TO COLOR INTERFERENCE OF URINE PIGMENT   LEUKOCYTESUR (A) 09/30/2022 1058    TEST NOT REPORTED DUE TO COLOR INTERFERENCE OF URINE PIGMENT   Sepsis Labs Recent Labs  Lab 10/01/22 0015 10/02/22 0256 10/03/22 0314 10/04/22 0531  WBC 10.5 9.6 9.2 7.7   Microbiology Recent Results (from the past 240 hour(s))  Culture, blood (Routine X 2) w Reflex to ID Panel     Status: None (Preliminary result)   Collection Time: 09/30/22 10:55 AM   Specimen: BLOOD  Result Value Ref Range Status   Specimen Description   Final    BLOOD BLOOD LEFT ARM AEROBIC BOTTLE ONLY ANAEROBIC BOTTLE ONLY Performed at [facility], [address] [place], [place] [address]    Special Requests   Final    BOTTLES DRAWN AEROBIC AND ANAEROBIC Blood Culture adequate volume Performed at [facility], [address] [place], [place] [address]    Culture   Final    NO GROWTH 3 DAYS Performed at [facility], [address] [place], [place] [address]    Report Status PENDING  Incomplete  Culture, Urine (Do not remove urinary catheter, catheter placed by urology or difficult to place)     Status: None   Collection Time: 09/30/22 10:58 AM   Specimen: Urine, Catheterized  Result Value Ref Range Status   Specimen Description   Final    URINE, CATHETERIZED Performed at [facility], [address] [place],  [place] [address]    Special Requests   Final    NONE Performed at [facility], [address], [place], [place] [address]    Culture   Final     NO GROWTH Performed at [facility], [address] [place], [place] [address]    Report Status 10/01/2022 FINAL  Final  Culture, blood (Routine X 2) w Reflex to ID Panel     Status: None (Preliminary result)   Collection Time: 09/30/22 10:59 AM   Specimen: BLOOD  Result Value Ref Range Status   Specimen Description   Final    BLOOD BLOOD RIGHT ARM AEROBIC BOTTLE ONLY ANAEROBIC BOTTLE ONLY Performed at [facility], [address] [place], [place] [address]    Special Requests   Final    BOTTLES DRAWN AEROBIC AND ANAEROBIC Blood Culture adequate volume Performed at [facility], [address], [place], [place] [address]    Culture   Final    NO GROWTH 3 DAYS Performed at [facility], [address], [place], [place] [address]    Report Status PENDING  Incomplete  Respiratory (~20 pathogens) panel by PCR     Status: None   Collection Time: 10/01/22  8:08 AM   Specimen: Nasopharyngeal Swab; Respiratory  Result Value Ref Range Status   Adenovirus NOT DETECTED NOT DETECTED Final   Coronavirus 229E NOT DETECTED NOT DETECTED Final    Comment: (NOTE) The Coronavirus on the Respiratory Panel, DOES NOT test for the novel  Coronavirus (2019 nCoV)    Coronavirus HKU1 NOT DETECTED NOT DETECTED Final   Coronavirus NL63 NOT DETECTED NOT DETECTED Final   Coronavirus OC43 NOT DETECTED NOT DETECTED Final   Metapneumovirus NOT DETECTED NOT DETECTED Final   Rhinovirus / Enterovirus NOT DETECTED NOT DETECTED Final   Influenza A NOT DETECTED NOT DETECTED Final   Influenza B NOT DETECTED NOT DETECTED Final   Parainfluenza Virus 1 NOT DETECTED NOT DETECTED Final   Parainfluenza Virus 2 NOT DETECTED NOT DETECTED Final   Parainfluenza Virus 3 NOT DETECTED NOT DETECTED Final   Parainfluenza Virus 4 NOT DETECTED NOT DETECTED Final   Respiratory Syncytial Virus NOT DETECTED NOT DETECTED Final   Bordetella pertussis NOT DETECTED NOT DETECTED Final    Bordetella Parapertussis NOT DETECTED NOT DETECTED Final   Chlamydophila pneumoniae NOT DETECTED NOT DETECTED Final   Mycoplasma pneumoniae NOT DETECTED NOT DETECTED Final    Comment: Performed at [facility], [address] [place], [place] [address]     SIGNED:   Marinda Elk, MD  [facility] 10/04/2022, 8:40 AM Pager   If 7PM-7AM, please contact night-coverage [email or web address] Password [email or web address]

## 2022-10-05 DIAGNOSIS — I2609 Other pulmonary embolism with acute cor pulmonale: Secondary | ICD-10-CM | POA: Diagnosis not present

## 2022-10-05 LAB — CULTURE, BLOOD (ROUTINE X 2)
Culture: NO GROWTH
Culture: NO GROWTH
Special Requests: ADEQUATE
Special Requests: ADEQUATE

## 2022-10-05 LAB — CBC
HCT: 27.9 % — ABNORMAL LOW (ref 39.0–52.0)
Hemoglobin: 8.8 g/dL — ABNORMAL LOW (ref 13.0–17.0)
MCH: 31.3 pg (ref 26.0–34.0)
MCHC: 31.5 g/dL (ref 30.0–36.0)
MCV: 99.3 fL (ref 80.0–100.0)
Platelets: 519 10*3/uL — ABNORMAL HIGH (ref 150–400)
RBC: 2.81 MIL/uL — ABNORMAL LOW (ref 4.22–5.81)
RDW: 14.3 % (ref 11.5–15.5)
WBC: 7.2 10*3/uL (ref 4.0–10.5)
nRBC: 0 % (ref 0.0–0.2)

## 2022-10-05 LAB — PROTIME-INR
INR: 2.2 — ABNORMAL HIGH (ref 0.8–1.2)
Prothrombin Time: 25.1 s — ABNORMAL HIGH (ref 11.4–15.2)

## 2022-10-05 MED ORDER — WARFARIN SODIUM 2.5 MG PO TABS: 2.5000 mg | ORAL_TABLET | Freq: Once | ORAL | Status: DC

## 2022-10-05 NOTE — TOC Progression Note (Signed)
Transition of Care [facility]) - Progression Note    Patient Details  Name: [name] MRN: [id number] Date of Birth: 1940/12/31  Transition of Care [facility]) CM/SW Contact  Otelia Santee, LCSW Phone Number: 10/05/2022, 1:17 PM  Clinical Narrative:    RW and O2 has been ordered through [employer] to be delivered to pt's room.    Expected Discharge Plan: Skilled Nursing Facility Barriers to Discharge: Continued Medical Work up  Expected Discharge Plan and Services In-house Referral: Clinical Social Work Discharge Planning Services: NA Post Acute Care Choice: Home Health, Durable Medical Equipment Living arrangements for the past 2 months: Single Family Home                 DME Arranged: Oxygen, Walker rolling DME Agency: [employer] Date DME Agency Contacted: 10/05/22 Time DME Agency Contacted: 1000 Representative spoke with at DME Agency: Vaughan Basta HH Arranged: PT, OT HH Agency: [facility] Care         Social Determinants of Health (SDOH) Interventions SDOH Screenings   Food Insecurity: No Food Insecurity (09/22/2022)  Housing: Low Risk  (09/22/2022)  Transportation Needs: No Transportation Needs (09/22/2022)  Utilities: Not At Risk (09/22/2022)  Alcohol Screen: Low Risk  (05/06/2021)  Depression (PHQ2-9): Low Risk  (05/10/2022)  Financial Resource Strain: Low Risk  (05/06/2021)  Physical Activity: Insufficiently Active (05/06/2021)  Social Connections: Moderately Isolated (05/06/2021)  Stress: No Stress Concern Present (05/06/2021)  Tobacco Use: Low Risk  (09/24/2022)    Readmission Risk Interventions    09/30/2022    2:55 PM 09/23/2022    5:29 PM  Readmission Risk Prevention Plan  Post Dischage Appt  Complete  Medication Screening  Complete  Transportation Screening Complete Complete  PCP or Specialist Appt within 5-7 Days Complete   Home Care Screening Complete   Medication Review (RN CM) Referral to Pharmacy

## 2022-10-05 NOTE — Progress Notes (Signed)
Mobility Specialist - Progress Note   10/05/22 1305  Oxygen Therapy  O2 Device Nasal Cannula  O2 Flow Rate (L/min) 3 L/min  Mobility  Activity Ambulated with assistance in hallway  Level of Assistance Standby assist, set-up cues, supervision of patient - no hands on  Assistive Device Front wheel walker  Distance Ambulated (ft) 40 ft  Activity Response Tolerated well  Mobility Referral Yes  $Mobility charge 1 Mobility  Mobility Specialist Start Time (ACUTE ONLY) 1248  Mobility Specialist Stop Time (ACUTE ONLY) 1305  Mobility Specialist Time Calculation (min) (ACUTE ONLY) 17 min   Pt received in recliner and agreeable to mobility. Distance limited due to feeling lightheaded. Unable to obtain appropriate O2 reading due to SpO2 not working correctly. Pt to recliner after session with all needs met.   Pre-mobility: 100% SpO2  Chief Technology Officer

## 2022-10-05 NOTE — Progress Notes (Signed)
     Subjective: First time meeting pt. Reports no issue with urination as long as he is on Flomax. Amenable to voiding trial today. NAEON.   Objective: Vital signs in last 24 hours: Temp:  [98.1 F (36.7 C)-99.1 F (37.3 C)] 98.1 F (36.7 C) (08/28 0503) Pulse Rate:  [71-81] 81 (08/28 0503) Resp:  [17-18] 18 (08/28 0503) BP: (128-148)/(80-95) 148/95 (08/28 0503) SpO2:  [96 %-100 %] 96 % (08/28 0503) Weight:  [80 kg] 80 kg (08/28 0500)  Assessment/Plan: #hematuria #traumatic foley Clear yellow urine. CBI off for several days.  Voiding trial this am Follow up outpt with his regular urologist to discuss cystoscopy if deemed appropriate.   Intake/Output from previous day: 08/27 0701 - 08/28 0700 In: 120 [P.O.:120] Out: 1100 [Urine:1100]  Intake/Output this shift: No intake/output data recorded.  Physical Exam:  General: Alert and oriented CV: No cyanosis Lungs: equal chest rise Gu: Foley in place draining clear yellow urine  Lab Results: Recent Labs    10/03/22 0314 10/04/22 0531 10/05/22 0655  HGB 8.5* 8.2* 8.8*  HCT 27.1* 25.7* 27.9*   BMET Recent Labs    10/02/22 1104  NA 133*  K 4.4  CL 106  CO2 17*  GLUCOSE 90  BUN 24*  CREATININE 1.04  CALCIUM 8.2*     Studies/Results: CT KNEE RIGHT W CONTRAST  Result Date: 10/03/2022 CLINICAL DATA:  Knee pain, chronic, degenerative disease on xray (Age >= 5y) EXAM: CT OF THE RIGHT KNEE WITH CONTRAST TECHNIQUE: Multidetector CT imaging was performed following the standard protocol during bolus administration of intravenous contrast. RADIATION DOSE REDUCTION: This exam was performed according to the departmental dose-optimization program which includes automated exposure control, adjustment of the mA and/or kV according to patient size and/or use of iterative reconstruction technique. CONTRAST:  OMNIPAQUE IOHEXOL 300 MG/ML  SOLN COMPARISON:  X-ray 09/30/2022 FINDINGS: Bones/Joint/Cartilage No acute fracture. No  dislocation. 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 Moderate-large knee joint effusion. Numerous intra-articular loose bodies, the majority of which are present in the suprapatellar pouch although there are several large ossified loose bodies inferior to the patella. Moderate-sized Baker's cyst. Ligaments Suboptimally assessed by CT. Muscles and Tendons No acute musculotendinous abnormality by CT. Soft tissues Mild subcutaneous edema.  No organized fluid collections. IMPRESSION: 1. No acute osseous abnormality of the right knee. 2. 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 3. Moderate-large knee joint effusion with numerous intra-articular loose bodies. 4. Moderate-sized Baker's cyst. Electronically Signed   By: Duanne Guess D.O.   On: 10/03/2022 09:12      LOS: 14 days   Elmon Kirschner, NP [facility] Pager: [phone]  10/05/2022, 7:40 AM

## 2022-10-05 NOTE — Progress Notes (Signed)
PROGRESS NOTE    UNNAMED DAZEY  MVH:[id number] DOB: 05-16-1940 DOA: 09/21/2022 PCP: Shirline Frees, NP   Brief Narrative: The 82 yrs old male with past medical history of BPH, essential hypertension presents to the ED with marked exertional dyspnea and near syncope, 2D echo showed right heart strain, He was found to have a submassive PE,  admitted to the ICU, 2D echo showed right ventricular failure, IR consulted and performed direct thrombolysis, after 5 hours started developing gross hematuria, urology was consulted. Foley catheter was placed with CBI initially. IV heparin was stopped, IR was consulted as hematuria continue to worsen,  IVC filter was placed on 8/18/202.  Medically clear and patient is being discharged home with home services. HHS arranged.  Assessment & Plan:   Principal Problem:   Pulmonary emboli (HCC) Active Problems:   Acute hypoxemic respiratory failure (HCC)   Premature atrial contractions   Melanoma of skin (HCC)   Basal cell carcinoma   AKI (acute kidney injury) (HCC)   Healthcare-associated pneumonia   Acute respiratory failure with hypoxia secondary to massive pulmonary embolism: 2D echo showed right ventricular failure and admitted to the ICU. IR was consulted and direct thrombolysis was started,  he developed hematuria eventually had to be stopped along with heparin. IVC filter placed on 09/25/2022. Heparin was restarted on 09/26/2022 without bolus no further hematuria. Due to the elevated co-pay of his Eliquis and Xarelto , Coumadin was started and his INR became therapeutic. he will go home on 2 L of oxygen.   Gross hematuria: > Resolved Post direct thrombolysis, urology was consulted,  Foley was placed,  bladder was irrigated. Urine clear. He was started on Flomax and urology recommended for him to go home with a Foley and will follow-up with them as an outpatient.  Successfully voided after catheter was removed.   Severe sepsis secondarily to  Healthcare associated pneumonia: He started spiking fevers had a mild elevated white blood cell count urine culture was negative. CT of the chest showed no infiltrate.  He was start empirically on IV vancomycin and cefepime,  he defervesced. He was also started on midodrine to help keep his blood pressure up he was fluid resuscitated. We were able to wean him off midodrine and he will continue oral Augmentin as an outpatient for total of 8 days of antibiotics.   Acute kidney injury: Likely due to sepsis: This resolved with IV fluids and midodrine. We were able to wean him off midodrine.   Thrombocytosis: Likely reactive,  now resolving.   Severe osteoarthritis of the right knee: CT of the knee was done that showed no acute fracture,  just some effusion.  orthopedic surgery was consulted recommended conservative management with pain control and physical therapy.    Acute blood loss anemia: Likely secondary to hematuria hemoglobin dropped 8,  he was transfused 1 unit packed red blood cells after this his hemoglobin remained relatively stable.   Paroxysmal atrial fibrillation: Rate controlled on Coreg continue Coumadin.   Chronic pain/fibromyalgia: Continue Voltaren gel.   History of BPH: Continue Flomax.   Chronic pain: Continue Oxy.   Essential hypertension: He will resume Coreg in 2 to 3 days and will follow-up with PCP and lisinopril will be restarted as an outpatient as tolerated.   Hyperlipidemia: Continue statins. Discharge Instructions    DVT prophylaxis:  Coumadin Code Status: Full code Family Communication: Family at bed side Disposition Plan:  Patient is being discharged, home health services arranged   Consultants:  Urology IR  Procedures: IVC filter  Antimicrobials:    Subjective: Patient seen and examined at bedside.  Overnight events noted.  Patient report doing much better.  Patient is successfully voided , urine has been clear.  Patient wants to  be discharged.  Objective: Vitals:   10/04/22 1936 10/05/22 0500 10/05/22 0503 10/05/22 1254  BP: 132/82  (!) 148/95 123/70  Pulse: 71  81 74  Resp: 17  18 18   Temp: 99.1 F (37.3 C)  98.1 F (36.7 C) 98.3 F (36.8 C)  TempSrc: Oral  Oral Oral  SpO2: 100%  96% 100%  Weight:  80 kg    Height:        Intake/Output Summary (Last 24 hours) at 10/05/2022 1606 Last data filed at 10/05/2022 1300 Gross per 24 hour  Intake 340 ml  Output 1650 ml  Net -1310 ml   Filed Weights   10/03/22 0500 10/04/22 0500 10/05/22 0500  Weight: 81.2 kg 81.3 kg 80 kg    Examination:  General exam: Appears calm and comfortable , NAD Respiratory system: Clear to auscultation. Respiratory effort normal. Cardiovascular system: S1 & S2 heard, RRR. No JVD, murmurs, rubs, gallops or clicks. No pedal edema. Gastrointestinal system: Abdomen is nondistended, soft and nontender. Normal bowel sounds heard. Central nervous system: Alert and oriented X3. No focal neurological deficits. Extremities: Symmetric 5 x 5 power. Skin: No rashes, lesions or ulcers Psychiatry: Judgement and insight appear normal. Mood & affect appropriate.     Data Reviewed: I have personally reviewed following labs and imaging studies  CBC: Recent Labs  Lab 10/01/22 0015 10/01/22 1026 10/02/22 0256 10/02/22 1855 10/03/22 0314 10/04/22 0531 10/05/22 0655  WBC 10.5  --  9.6  --  9.2 7.7 7.2  NEUTROABS  --  6.1  --   --   --   --   --   HGB 8.2*  --  7.3* 8.7* 8.5* 8.2* 8.8*  HCT 25.6*  --  23.6* 27.2* 27.1* 25.7* 27.9*  MCV 99.2  --  102.2*  --  98.9 98.1 99.3  PLT 418*  --  393  --  450* 483* 519*   Basic Metabolic Panel: Recent Labs  Lab 09/30/22 1055 09/30/22 2302 10/01/22 0015 10/02/22 1104  NA 129*  --  130* 133*  K 4.6  --  4.5 4.4  CL 97*  --  103 106  CO2 26  --  22 17*  GLUCOSE 104*  --  103* 90  BUN 21  --  26* 24*  CREATININE 1.40*  --  1.41* 1.04  CALCIUM 8.2*  --  7.6* 8.2*  MG  --  2.1  --   --    PHOS  --  3.2  --   --    GFR: Estimated Creatinine Clearance: 61.1 mL/min (by C-G formula based on SCr of 1.04 mg/dL). Liver Function Tests: No results for input(s): "AST", "ALT", "ALKPHOS", "BILITOT", "PROT", "ALBUMIN" in the last 168 hours. No results for input(s): "LIPASE", "AMYLASE" in the last 168 hours. No results for input(s): "AMMONIA" in the last 168 hours. Coagulation Profile: Recent Labs  Lab 10/01/22 0015 10/02/22 0256 10/03/22 0314 10/04/22 0531 10/05/22 0547  INR 1.6* 2.0* 2.1* 2.2* 2.2*   Cardiac Enzymes: No results for input(s): "CKTOTAL", "CKMB", "CKMBINDEX", "TROPONINI" in the last 168 hours. BNP (last 3 results) No results for input(s): "PROBNP" in the last 8760 hours. HbA1C: No results for input(s): "HGBA1C" in the last 72 hours. CBG: No results  for input(s): "GLUCAP" in the last 168 hours. Lipid Profile: No results for input(s): "CHOL", "HDL", "LDLCALC", "TRIG", "CHOLHDL", "LDLDIRECT" in the last 72 hours. Thyroid Function Tests: No results for input(s): "TSH", "T4TOTAL", "FREET4", "T3FREE", "THYROIDAB" in the last 72 hours. Anemia Panel: No results for input(s): "VITAMINB12", "FOLATE", "FERRITIN", "TIBC", "IRON", "RETICCTPCT" in the last 72 hours. Sepsis Labs: Recent Labs  Lab 10/01/22 0015  LATICACIDVEN 0.7    Recent Results (from the past 240 hour(s))  Culture, blood (Routine X 2) w Reflex to ID Panel     Status: None   Collection Time: 09/30/22 10:55 AM   Specimen: BLOOD  Result Value Ref Range Status   Specimen Description   Final    BLOOD BLOOD LEFT ARM AEROBIC BOTTLE ONLY ANAEROBIC BOTTLE ONLY Performed at [facility], [address], [place], [place] [address]    Special Requests   Final    BOTTLES DRAWN AEROBIC AND ANAEROBIC Blood Culture adequate volume Performed at [facility], [address] [place], [place] [address]    Culture   Final    NO GROWTH 5 DAYS Performed at [facility], [address] [place], [place] [address]    Report Status 10/05/2022 FINAL  Final  Culture, Urine (Do not remove urinary catheter, catheter placed by urology or difficult to place)     Status: None   Collection Time: 09/30/22 10:58 AM   Specimen: Urine, Catheterized  Result Value Ref Range Status   Specimen Description   Final    URINE, CATHETERIZED Performed at [facility], [address] [place], [place] [address]    Special Requests   Final    NONE Performed at [facility], [address] [place], [place] [address]    Culture   Final    NO GROWTH Performed at [facility], [address], [place], [place] [address]    Report Status 10/01/2022 FINAL  Final  Culture, blood (Routine X 2) w Reflex to ID Panel     Status: None   Collection Time: 09/30/22 10:59 AM   Specimen: BLOOD  Result Value Ref Range Status   Specimen Description   Final    BLOOD BLOOD RIGHT ARM AEROBIC BOTTLE ONLY ANAEROBIC BOTTLE ONLY Performed at [facility], [address] [place], [place] [address]    Special Requests   Final    BOTTLES DRAWN AEROBIC AND ANAEROBIC Blood Culture adequate volume Performed at [facility], [address], [place], [place] [address]    Culture   Final    NO GROWTH 5 DAYS Performed at [facility], [address] [place], [place] [address]    Report Status 10/05/2022 FINAL  Final  Respiratory (~20 pathogens) panel by PCR     Status: None   Collection Time: 10/01/22  8:08 AM   Specimen: Nasopharyngeal Swab; Respiratory  Result Value Ref Range Status   Adenovirus NOT DETECTED NOT DETECTED Final   Coronavirus 229E NOT DETECTED NOT DETECTED Final    Comment: (NOTE) The Coronavirus on the Respiratory Panel, DOES NOT test for the novel  Coronavirus (2019 nCoV)    Coronavirus HKU1 NOT DETECTED NOT DETECTED Final   Coronavirus NL63 NOT DETECTED NOT DETECTED Final    Coronavirus OC43 NOT DETECTED NOT DETECTED Final   Metapneumovirus NOT DETECTED NOT DETECTED Final   Rhinovirus / Enterovirus NOT DETECTED NOT DETECTED Final   Influenza A NOT  DETECTED NOT DETECTED Final   Influenza B NOT DETECTED NOT DETECTED Final   Parainfluenza Virus 1 NOT DETECTED NOT DETECTED Final   Parainfluenza Virus 2 NOT DETECTED NOT DETECTED Final   Parainfluenza Virus 3 NOT DETECTED NOT DETECTED Final   Parainfluenza Virus 4 NOT DETECTED NOT DETECTED Final   Respiratory Syncytial Virus NOT DETECTED NOT DETECTED Final   Bordetella pertussis NOT DETECTED NOT DETECTED Final   Bordetella Parapertussis NOT DETECTED NOT DETECTED Final   Chlamydophila pneumoniae NOT DETECTED NOT DETECTED Final   Mycoplasma pneumoniae NOT DETECTED NOT DETECTED Final    Comment: Performed at [facility], [address] [place], [place] [address]    Radiology Studies: No results found.  Scheduled Meds:  buPROPion  150 mg Oral Daily   Chlorhexidine Gluconate Cloth  6 each Topical Daily   colchicine  0.3 mg Oral BID   midodrine  5 mg Oral TID WC   oxybutynin  5 mg Oral TID   tamsulosin  0.4 mg Oral Daily   warfarin  2.5 mg Oral ONCE-1600   Warfarin - Pharmacist Dosing Inpatient   Does not apply q1600   Continuous Infusions:  promethazine (PHENERGAN) injection (IM or IVPB) Stopped (10/02/22 0310)     LOS: 14 days    Time spent: 50 mins    Willeen Niece, MD [facility]   If 7PM-7AM, please contact night-coverage

## 2022-10-05 NOTE — Progress Notes (Signed)
SATURATION QUALIFICATIONS: (This note is used to comply with regulatory documentation for home oxygen)  Patient Saturations on Room Air at Rest = 84%  Patient Saturations on Room Air while Ambulating = NT due to desat on 3 L%  Patient Saturations on 3 Liters of oxygen while Ambulating = 87%  Please briefly explain why patient needs home oxygen:Requires supplemental Oxygent to maintain Saturation during activities such as ambulation. Blanchard Kelch PT Acute Rehabilitation Services Office [phone] Weekend [phone]

## 2022-10-05 NOTE — Progress Notes (Signed)
ANTICOAGULATION CONSULT NOTE - Follow Up Consult  Pharmacy Consult for warfarin Indication:  acute pulmonary embolus and DVT  Allergies  Allergen Reactions   Other Other (See Comments)    Mangos - caused a severe rash   Prozac [Fluoxetine Hcl]     Made him feel "loopy"   Sulfa Antibiotics Hives   Vicodin [Hydrocodone-Acetaminophen] Itching    Tolerates oxycodone   Erythromycin Rash    Many years ago an oral antibiotic caused a rash(wife and patient think it was erythromycin but they are not positive).    Patient Measurements: Height: 6' (182.9 cm) Weight: 80 kg (176 lb 5.9 oz) IBW/kg (Calculated) : 77.6 Heparin Dosing Weight: 77 kg  Vital Signs: Temp: 98.1 F (36.7 C) (08/28 0503) Temp Source: Oral (08/28 0503) BP: 148/95 (08/28 0503) Pulse Rate: 81 (08/28 0503)  Labs: Recent Labs    10/02/22 1104 10/02/22 1855 10/03/22 0314 10/04/22 0531 10/05/22 0547 10/05/22 0655  HGB  --    < > 8.5* 8.2*  --  8.8*  HCT  --    < > 27.1* 25.7*  --  27.9*  PLT  --   --  450* 483*  --  519*  LABPROT  --   --  23.4* 24.9* 25.1*  --   INR  --   --  2.1* 2.2* 2.2*  --   CREATININE 1.04  --   --   --   --   --    < > = values in this interval not displayed.    Estimated Creatinine Clearance: 61.1 mL/min (by C-G formula based on SCr of 1.04 mg/dL).   Medications:  No AC PTA  Assessment: Patient is an 82 y.o M with recent COVID infection (8/7) who presented to the ED on 09/21/22 with c/o abdominal pain, HA, SOB and dizziness. He was subsequently found to have acute bilateral PE with RHS and RLE DVT. Catheter directed tPA initiated on 8/14, but stopped after 4 hours due to bleeding. IVC filter placed on 09/25/22. He was started on heparin drip, which was continued as bridging while starting on warfarin. Heparin was eventually stopped for hematuria and warfarin continued.   Significant events:  - 8/14 chest CT: acute BL PE with RHS. Ureteral stones. - 8/14 LE doppler: acute RLE  -  8/14: B catheter directed thrombolysis. RN reported bleeding bleeding from penis, surgical site on his face, and hematuria. CCM consulted, stopped tpa @ 2128 (only got tPA for 4 hrs) - 8/15: heparin resumed with NO BOLUS - 8/18: heparin d/c'd due to bleeding and IVC filter placed. - 8/19: heparin resumed given worsening PE sx's (chest tightness, SOB) - 8/21: started on warfarin - 8/24: UFH/warfarin d/c'd due to gross hematuria - 8/25: hematuria resolved, restart warfarin but not UFH  Today 10/05/2022 INR = 2.2 remains therapeutic CBC stable, Hgb: 8.8, Plt: 519 Urology documented clear yellow urine. No hematuria reported.  Goal of Therapy:  INR 2-3   Plan:  Continue with warfarin 2.5 mg today Daily INR Monitor for S/S of bleeds, particularly restart of hematuria  At this time, I would recommend discharging patient on warfarin 2.5 mg PO daily with appointment at clinic for INR check within 48 hours of discharge to guide further dosing.    Teyonna Plaisted 10/05/2022,9:38 AM

## 2022-10-05 NOTE — Plan of Care (Signed)
  Problem: Education: Goal: Knowledge of General Education information will improve Description: Including pain rating scale, medication(s)/side effects and non-pharmacologic comfort measures Outcome: Progressing   Problem: Clinical Measurements: Goal: Ability to maintain clinical measurements within normal limits will improve Outcome: Progressing Goal: Will remain free from infection Outcome: Progressing   Problem: Pain Managment: Goal: General experience of comfort will improve Outcome: Progressing   Problem: Safety: Goal: Ability to remain free from injury will improve Outcome: Progressing   Problem: Skin Integrity: Goal: Risk for impaired skin integrity will decrease Outcome: Progressing

## 2022-10-06 ENCOUNTER — Telehealth: Payer: Self-pay | Admitting: Adult Health

## 2022-10-06 ENCOUNTER — Telehealth: Payer: Self-pay

## 2022-10-06 NOTE — Telephone Encounter (Signed)
Requesting RN do start of care, pt discharged 10/05/22. Advised there will be a delay , will not start until 10/08/22

## 2022-10-06 NOTE — Transitions of Care (Post Inpatient/ED Visit) (Signed)
10/06/2022  Name: [name] MRN: [id number] DOB: [birth year]  Today's TOC FU Call Status: Today's TOC FU Call Status:: Successful TOC FU Call Completed TOC FU Call Complete Date: 10/06/22 (Call completed with wife-[name]) Patient's Name and Date of Birth confirmed.  Transition Care Management Follow-up Telephone Call Date of Discharge: 10/05/22 Discharge Facility: [facility]) Type of Discharge: Inpatient Admission Primary Inpatient Discharge Diagnosis:: "acute pulmonary embolism" How have you been since you were released from the hospital?: Same (Wife shares that they had a rough night last night-pt is weaker than he thought/expected to be-requiring assistance with ADLs/IADLs.) Any questions or concerns?: Yes Patient Questions/Concerns:: Wife states pt sent home without discussing d/c plans with family-Family thinks pt should have went to SNF.Pt sent home with portable oxygen tank-ran out of oxygen in middle of night. Pulse ox sats have been 94-100% without oxygen. Pt having no SOB. They contacted [employer]-during the night and was unable to get much assistance Patient Questions/Concerns Addressed: Other:, Notified Provider of Patient Questions/Concerns (RN CM contacted [employer] to f/u on oxygen issue-advised that they had tried to reach pt yest to sechedule delivery-left VM-no return call. Instructed to call wife ASAP and requested delivery ASAP.They will follow up.) Discussed with wife their goals/plan and if they wanted to pursue getting pt placed or manage his care at home. She voices frustration that pt "was allowed to make that decision on his own while in the hospital to come home without consulting family." She report that her and her son are trying to work things out and deal with having to care for pt in the home. Offered SW referral for assistance/resources with in home support and further info on facility placement but she declined.  Advised wife that RN CM had contacted oxygen  supplier and provided her with update and that she should expect a call from them ASAP.  Items Reviewed: Did you receive and understand the discharge instructions provided?: Yes Medications obtained,verified, and reconciled?: Partial Review Completed Reason for Partial Mediation Review: reviewed mew meds/med changes as wife did not have a lot of time to talk-had to take care of patient Any new allergies since your discharge?: No Dietary orders reviewed?: Yes Type of Diet Ordered:: low salt/heart healthy Do you have support at home?: Yes People in Home: spouse, child(ren), adult Name of Support/Comfort Primary Source: wife and son helping  Medications Reviewed Today: Medications Reviewed Today     Reviewed by Charlyn Minerva, RN (Registered Nurse) on 10/06/22 at 1239  Med List Status: <None>   Medication Order Taking? Sig Documenting Provider Last Dose Status Informant  amoxicillin-clavulanate (AUGMENTIN) 875-125 MG tablet [id number]  Take 1 tablet by mouth every 12 (twelve) hours. Marinda Elk, MD  Active   aspirin EC 81 MG tablet [id number] No Take 1 tablet (81 mg total) by mouth daily. Lars Masson, MD 09/20/2022 Active Self, Pharmacy Records  atorvastatin (LIPITOR) 10 MG tablet [id number] No Take 1 tablet (10 mg total) by mouth daily.  Patient not taking: Reported on 09/21/2022   Shirline Frees, NP Not Taking Active Self, Pharmacy Records  buPROPion [facility] XL) 150 MG 24 hr tablet [id number] No TAKE 1 TABLET BY MOUTH EVERY DAY Deeann Saint, MD 09/20/2022 Active Self, Pharmacy Records  carvedilol (COREG) 3.125 MG tablet [id number]  Take 1 tablet (3.125 mg total) by mouth 2 (two) times daily. Marinda Elk, MD  Active   meclizine (ANTIVERT) 25 MG tablet [id number] No Take 1 tablet (25 mg total)  by mouth 3 (three) times daily as needed for dizziness. Nafziger, Kandee Keen, NP Loreli Slot Active Self, Pharmacy Records  Misc Natural Products (OSTEO BI-FLEX ADV JOINT SHIELD  PO) [id number] No Take 2 capsules by mouth daily. [provider] Past Week Active Self, Pharmacy Records  Multiple Vitamin (MULTIVITAMIN) tablet [id number] No Take 1 tablet by mouth daily. [provider] Past Week Active Self, Pharmacy Records  ondansetron (ZOFRAN) 4 MG tablet [id number] No Take 1 tablet (4 mg total) by mouth every 8 (eight) hours as needed for nausea or vomiting. Shirline Frees, NP 09/20/2022 Active Self, Pharmacy Records  oxyCODONE (OXY IR/ROXICODONE) 5 MG immediate release tablet [id number]  Take 1 tablet (5 mg total) by mouth every 3 (three) hours as needed for breakthrough pain. Marinda Elk, MD  Active   tamsulosin [facility]) 0.4 MG CAPS capsule [id number] No Take 1 capsule (0.4 mg total) by mouth daily. Shirline Frees, NP 09/20/2022 Active Self, Pharmacy Records  warfarin (COUMADIN) 2.5 MG tablet [id number]  Take 1 tablet (2.5 mg total) by mouth daily. Marinda Elk, MD  Active             Home Care and Equipment/Supplies: Were Home Health Services Ordered?: Yes Name of Home Health Agency:: [facility] Has Agency set up a time to come to your home?:  (Family has spoken with agency already and they will call back to confirm date & time) Any new equipment or medical supplies ordered?: Yes Name of Medical supply agency?: [employer],oxygen Were you able to get the equipment/medical supplies?: No (See note above) Do you have any questions related to the use of the equipment/supplies?: No  Functional Questionnaire: Do you need assistance with bathing/showering or dressing?: Yes Do you need assistance with meal preparation?: Yes Do you need assistance with eating?: No Do you have difficulty maintaining continence: Yes Do you need assistance with getting out of bed/getting out of a chair/moving?: Yes Do you have difficulty managing or taking your medications?: Yes  Follow up appointments reviewed: PCP Follow-up appointment confirmed?: No  (declined making an appt duing this call will see how pt is doing in the next few days) MD Provider Line [phone] Given: No Specialist Hospital Follow-up appointment confirmed?: No Do you need transportation to your follow-up appointment?: No Do you understand care options if your condition(s) worsen?: Yes-patient verbalized understanding  SDOH Interventions Today    Flowsheet Row Most Recent Value  SDOH Interventions   Food Insecurity Interventions Intervention Not Indicated  Transportation Interventions Intervention Not Indicated      TOC Interventions Today    Flowsheet Row Most Recent Value  TOC Interventions   TOC Interventions Discussed/Reviewed TOC Interventions Discussed, Contact DME company for patient use of equipment, Contacted provider for patient needs  [Contacted [employer] to follow up on delivery of oxygen-advised that they had been unable to reach pt at number yesterday and left mssage-Requested staff contact wife at her number and confirmed they had number on file-requested oxygen be delivered ASAP]      Interventions Today    Flowsheet Row Most Recent Value  General Interventions   General Interventions Discussed/Reviewed General Interventions Discussed, Doctor Visits, Durable Medical Equipment (DME)  Doctor Visits Discussed/Reviewed Doctor Visits Discussed, PCP, Specialist  Durable Medical Equipment (DME) Other, Oxygen, [facility] [place] ox]  PCP/Specialist Visits Compliance with follow-up visit  Education Interventions   Education Provided Provided Education  Provided Verbal Education On Nutrition, Medication, When to see the doctor  Mental Health Interventions   Mental Health  Discussed/Reviewed Refer to Social Work for resources  [employer] SW referral to wife for in home support resources, discussion/assistance of facility placement-she voiced she was already aware of that info and declined referral at this time]  Nutrition Interventions   Nutrition  Discussed/Reviewed Nutrition Discussed  Pharmacy Interventions   Pharmacy Dicussed/Reviewed Pharmacy Topics Discussed, Medications and their functions  Safety Interventions   Safety Discussed/Reviewed Safety Discussed, Fall Risk, Home Safety  Home Safety Assistive Devices       [place], [place] [facility] Care Management Care Management Community Coordinator Direct Phone: [phone] Toll Free: [phone] Fax: [phone]

## 2022-10-08 DIAGNOSIS — J9601 Acute respiratory failure with hypoxia: Secondary | ICD-10-CM | POA: Diagnosis not present

## 2022-10-08 DIAGNOSIS — D62 Acute posthemorrhagic anemia: Secondary | ICD-10-CM | POA: Diagnosis not present

## 2022-10-08 DIAGNOSIS — K4090 Unilateral inguinal hernia, without obstruction or gangrene, not specified as recurrent: Secondary | ICD-10-CM | POA: Diagnosis not present

## 2022-10-08 DIAGNOSIS — C4359 Malignant melanoma of other part of trunk: Secondary | ICD-10-CM | POA: Diagnosis not present

## 2022-10-08 DIAGNOSIS — K429 Umbilical hernia without obstruction or gangrene: Secondary | ICD-10-CM | POA: Diagnosis not present

## 2022-10-08 DIAGNOSIS — M7121 Synovial cyst of popliteal space [Baker], right knee: Secondary | ICD-10-CM | POA: Diagnosis not present

## 2022-10-08 DIAGNOSIS — G8929 Other chronic pain: Secondary | ICD-10-CM | POA: Diagnosis not present

## 2022-10-08 DIAGNOSIS — I2699 Other pulmonary embolism without acute cor pulmonale: Secondary | ICD-10-CM | POA: Diagnosis not present

## 2022-10-08 DIAGNOSIS — I491 Atrial premature depolarization: Secondary | ICD-10-CM | POA: Diagnosis not present

## 2022-10-08 DIAGNOSIS — I501 Left ventricular failure: Secondary | ICD-10-CM | POA: Diagnosis not present

## 2022-10-08 DIAGNOSIS — I480 Paroxysmal atrial fibrillation: Secondary | ICD-10-CM | POA: Diagnosis not present

## 2022-10-08 DIAGNOSIS — R918 Other nonspecific abnormal finding of lung field: Secondary | ICD-10-CM | POA: Diagnosis not present

## 2022-10-08 DIAGNOSIS — M1711 Unilateral primary osteoarthritis, right knee: Secondary | ICD-10-CM | POA: Diagnosis not present

## 2022-10-08 DIAGNOSIS — D630 Anemia in neoplastic disease: Secondary | ICD-10-CM | POA: Diagnosis not present

## 2022-10-08 DIAGNOSIS — N400 Enlarged prostate without lower urinary tract symptoms: Secondary | ICD-10-CM | POA: Diagnosis not present

## 2022-10-08 DIAGNOSIS — Z483 Aftercare following surgery for neoplasm: Secondary | ICD-10-CM | POA: Diagnosis not present

## 2022-10-08 DIAGNOSIS — N202 Calculus of kidney with calculus of ureter: Secondary | ICD-10-CM | POA: Diagnosis not present

## 2022-10-08 DIAGNOSIS — A419 Sepsis, unspecified organism: Secondary | ICD-10-CM | POA: Diagnosis not present

## 2022-10-08 DIAGNOSIS — M797 Fibromyalgia: Secondary | ICD-10-CM | POA: Diagnosis not present

## 2022-10-08 DIAGNOSIS — E785 Hyperlipidemia, unspecified: Secondary | ICD-10-CM | POA: Diagnosis not present

## 2022-10-08 DIAGNOSIS — M25461 Effusion, right knee: Secondary | ICD-10-CM | POA: Diagnosis not present

## 2022-10-08 DIAGNOSIS — I110 Hypertensive heart disease with heart failure: Secondary | ICD-10-CM | POA: Diagnosis not present

## 2022-10-08 DIAGNOSIS — J181 Lobar pneumonia, unspecified organism: Secondary | ICD-10-CM | POA: Diagnosis not present

## 2022-10-08 DIAGNOSIS — I700 Atherosclerosis of aorta: Secondary | ICD-10-CM | POA: Diagnosis not present

## 2022-10-08 DIAGNOSIS — Z48812 Encounter for surgical aftercare following surgery on the circulatory system: Secondary | ICD-10-CM | POA: Diagnosis not present

## 2022-10-11 ENCOUNTER — Other Ambulatory Visit: Payer: Self-pay | Admitting: Family Medicine

## 2022-10-11 ENCOUNTER — Telehealth: Payer: Self-pay | Admitting: Adult Health

## 2022-10-11 DIAGNOSIS — Z48812 Encounter for surgical aftercare following surgery on the circulatory system: Secondary | ICD-10-CM | POA: Diagnosis not present

## 2022-10-11 DIAGNOSIS — M1711 Unilateral primary osteoarthritis, right knee: Secondary | ICD-10-CM | POA: Diagnosis not present

## 2022-10-11 DIAGNOSIS — A419 Sepsis, unspecified organism: Secondary | ICD-10-CM | POA: Diagnosis not present

## 2022-10-11 DIAGNOSIS — F419 Anxiety disorder, unspecified: Secondary | ICD-10-CM

## 2022-10-11 DIAGNOSIS — I2699 Other pulmonary embolism without acute cor pulmonale: Secondary | ICD-10-CM | POA: Diagnosis not present

## 2022-10-11 DIAGNOSIS — J181 Lobar pneumonia, unspecified organism: Secondary | ICD-10-CM | POA: Diagnosis not present

## 2022-10-11 DIAGNOSIS — Z483 Aftercare following surgery for neoplasm: Secondary | ICD-10-CM | POA: Diagnosis not present

## 2022-10-11 NOTE — Telephone Encounter (Signed)
Please advise 

## 2022-10-11 NOTE — Telephone Encounter (Signed)
Nursing 1x4 pt has pneumonia. Taking warfarin/coumadin, does not know when he needs to go to coumadin clinic. Discharge papers stated he was to go home on oxygen, patient has no oxygen and does not want any. Asking should patient take atorvastatin (LIPITOR) 10 MG tablet or meclizine (ANTIVERT) 25 MG tablet. They are listed on his discharge papers but he doesn't have them because he says doc discontinued. Pt also requesting a wheelchair.

## 2022-10-12 ENCOUNTER — Telehealth: Payer: Self-pay | Admitting: Adult Health

## 2022-10-12 DIAGNOSIS — I2699 Other pulmonary embolism without acute cor pulmonale: Secondary | ICD-10-CM | POA: Diagnosis not present

## 2022-10-12 DIAGNOSIS — Z48812 Encounter for surgical aftercare following surgery on the circulatory system: Secondary | ICD-10-CM | POA: Diagnosis not present

## 2022-10-12 DIAGNOSIS — J181 Lobar pneumonia, unspecified organism: Secondary | ICD-10-CM | POA: Diagnosis not present

## 2022-10-12 DIAGNOSIS — A419 Sepsis, unspecified organism: Secondary | ICD-10-CM | POA: Diagnosis not present

## 2022-10-12 DIAGNOSIS — M1711 Unilateral primary osteoarthritis, right knee: Secondary | ICD-10-CM | POA: Diagnosis not present

## 2022-10-12 DIAGNOSIS — Z483 Aftercare following surgery for neoplasm: Secondary | ICD-10-CM | POA: Diagnosis not present

## 2022-10-12 NOTE — Telephone Encounter (Signed)
Alexis OT [facility] is calling and she did OT eval today and pt is ok with dressing, shower and pt is independent

## 2022-10-12 NOTE — Telephone Encounter (Signed)
Left detailed message informing [name] of update via confidential vm.

## 2022-10-13 DIAGNOSIS — M1711 Unilateral primary osteoarthritis, right knee: Secondary | ICD-10-CM | POA: Diagnosis not present

## 2022-10-13 DIAGNOSIS — Z483 Aftercare following surgery for neoplasm: Secondary | ICD-10-CM | POA: Diagnosis not present

## 2022-10-13 DIAGNOSIS — J181 Lobar pneumonia, unspecified organism: Secondary | ICD-10-CM | POA: Diagnosis not present

## 2022-10-13 DIAGNOSIS — I2699 Other pulmonary embolism without acute cor pulmonale: Secondary | ICD-10-CM | POA: Diagnosis not present

## 2022-10-13 DIAGNOSIS — Z48812 Encounter for surgical aftercare following surgery on the circulatory system: Secondary | ICD-10-CM | POA: Diagnosis not present

## 2022-10-13 DIAGNOSIS — A419 Sepsis, unspecified organism: Secondary | ICD-10-CM | POA: Diagnosis not present

## 2022-10-14 ENCOUNTER — Encounter: Payer: Medicare Other | Admitting: Student

## 2022-10-18 ENCOUNTER — Ambulatory Visit: Payer: Medicare Other | Admitting: Adult Health

## 2022-10-18 ENCOUNTER — Encounter: Payer: Self-pay | Admitting: Adult Health

## 2022-10-18 VITALS — BP 142/78 | HR 70 | Temp 97.6°F | Wt 157.0 lb

## 2022-10-18 DIAGNOSIS — E782 Mixed hyperlipidemia: Secondary | ICD-10-CM | POA: Diagnosis not present

## 2022-10-18 DIAGNOSIS — I10 Essential (primary) hypertension: Secondary | ICD-10-CM | POA: Diagnosis not present

## 2022-10-18 DIAGNOSIS — R310 Gross hematuria: Secondary | ICD-10-CM

## 2022-10-18 DIAGNOSIS — D62 Acute posthemorrhagic anemia: Secondary | ICD-10-CM

## 2022-10-18 DIAGNOSIS — M1711 Unilateral primary osteoarthritis, right knee: Secondary | ICD-10-CM | POA: Diagnosis not present

## 2022-10-18 DIAGNOSIS — D75839 Thrombocytosis, unspecified: Secondary | ICD-10-CM | POA: Diagnosis not present

## 2022-10-18 DIAGNOSIS — Z87448 Personal history of other diseases of urinary system: Secondary | ICD-10-CM

## 2022-10-18 DIAGNOSIS — Z87438 Personal history of other diseases of male genital organs: Secondary | ICD-10-CM

## 2022-10-18 DIAGNOSIS — A419 Sepsis, unspecified organism: Secondary | ICD-10-CM

## 2022-10-18 DIAGNOSIS — Z8619 Personal history of other infectious and parasitic diseases: Secondary | ICD-10-CM

## 2022-10-18 DIAGNOSIS — Z8709 Personal history of other diseases of the respiratory system: Secondary | ICD-10-CM | POA: Diagnosis not present

## 2022-10-18 DIAGNOSIS — I480 Paroxysmal atrial fibrillation: Secondary | ICD-10-CM

## 2022-10-18 DIAGNOSIS — J9601 Acute respiratory failure with hypoxia: Secondary | ICD-10-CM

## 2022-10-18 DIAGNOSIS — J189 Pneumonia, unspecified organism: Secondary | ICD-10-CM

## 2022-10-18 DIAGNOSIS — I2609 Other pulmonary embolism with acute cor pulmonale: Secondary | ICD-10-CM | POA: Diagnosis not present

## 2022-10-18 DIAGNOSIS — N179 Acute kidney failure, unspecified: Secondary | ICD-10-CM | POA: Diagnosis not present

## 2022-10-18 LAB — PROTIME-INR
INR: 1.3 ratio — ABNORMAL HIGH (ref 0.8–1.0)
Prothrombin Time: 13.3 s — ABNORMAL HIGH (ref 9.6–13.1)

## 2022-10-18 LAB — CBC WITH DIFFERENTIAL/PLATELET
Basophils Absolute: 0.1 10*3/uL (ref 0.0–0.1)
Basophils Relative: 1 % (ref 0.0–3.0)
Eosinophils Absolute: 0.1 10*3/uL (ref 0.0–0.7)
Eosinophils Relative: 0.9 % (ref 0.0–5.0)
HCT: 39 % (ref 39.0–52.0)
Hemoglobin: 12.3 g/dL — ABNORMAL LOW (ref 13.0–17.0)
Lymphocytes Relative: 14.8 % (ref 12.0–46.0)
Lymphs Abs: 1 10*3/uL (ref 0.7–4.0)
MCHC: 31.6 g/dL (ref 30.0–36.0)
MCV: 97.1 fl (ref 78.0–100.0)
Monocytes Absolute: 0.7 10*3/uL (ref 0.1–1.0)
Monocytes Relative: 11.1 % (ref 3.0–12.0)
Neutro Abs: 4.7 10*3/uL (ref 1.4–7.7)
Neutrophils Relative %: 72.2 % (ref 43.0–77.0)
Platelets: 520 10*3/uL — ABNORMAL HIGH (ref 150.0–400.0)
RBC: 4.02 Mil/uL — ABNORMAL LOW (ref 4.22–5.81)
RDW: 16.7 % — ABNORMAL HIGH (ref 11.5–15.5)
WBC: 6.5 10*3/uL (ref 4.0–10.5)

## 2022-10-18 LAB — BASIC METABOLIC PANEL
BUN: 16 mg/dL (ref 6–23)
CO2: 29 meq/L (ref 19–32)
Calcium: 9.6 mg/dL (ref 8.4–10.5)
Chloride: 103 meq/L (ref 96–112)
Creatinine, Ser: 1.02 mg/dL (ref 0.40–1.50)
GFR: 69.09 mL/min (ref >60.00)
Glucose, Bld: 90 mg/dL (ref 70–99)
Potassium: 4.6 meq/L (ref 3.5–5.1)
Sodium: 139 meq/L (ref 135–145)

## 2022-10-18 MED ORDER — OXYCODONE HCL 5 MG PO TABS: 5.0000 mg | ORAL_TABLET | ORAL | 0 refills | Status: DC | PRN

## 2022-10-18 MED ORDER — BUPROPION HCL ER (XL) 150 MG PO TB24
150.0000 mg | ORAL_TABLET | Freq: Every day | ORAL | 1 refills | Status: AC
Start: 2022-10-18 — End: ?

## 2022-10-18 MED ORDER — ONDANSETRON 4 MG PO TBDP: 4.0000 mg | ORAL_TABLET | Freq: Three times a day (TID) | ORAL | 2 refills | Status: DC | PRN

## 2022-10-18 NOTE — Patient Instructions (Addendum)
It was great seeing you today   I am going to do some blood work on you   I will get you referred to Cardiology and the Coumadin clinic   Lets follow up in 3 weeks

## 2022-10-18 NOTE — Progress Notes (Signed)
Subjective:    Patient ID: [name], male    DOB: 1940-03-03, 82 y.o.   MRN: [id number]  HPI 82 year old male who  has a past medical history of Arthritis, Brain tumor (HCC), Colonic mass (2001), Fatigue, Fibromyalgia (1990's ), Frequent PVCs, Generalized body aches, H/O inguinal hernia, Hernia, umbilical, History of kidney stones, Hypertension, NSVT (nonsustained ventricular tachycardia) (HCC), Premature atrial contractions, Prolonged Q-T interval on ECG, and Wears dentures.  He presents to the office today for TCM visit  Admit Date 09/21/2022 Discharge Date 10/05/2022  He presented to the emergency room with marked exertional dyspnea and near syncope.  His 2D echo showed right heart strain which was found to have a submassive PE and he was admitted to the ICU.  Acute Respiratory failure with hypoxia secondary to massive pulmonary embolism -2D echo showed a right ventricular failure and admitted to the ICU, IR was consulted and direct thrombolysis was started, he developed hematuria and heparin was stopped.  He had an IVC filter placed on 09/25/2022 and heparin was restarted on 09/26/2022 without bolus.  He did not have any further hematuria.  Due to elevated co-pay of his Eliquis and Xarelto he was kept on Coumadin.  His INR became therapeutic and he was discharged home on 2 L of oxygen  Gross hematuria -Post direct thrombolysis, urology was consulted, Foley was placed and it was irrigated; his urine was clear.  He was started on Flomax and urology recommended he go home with a Foley and follow-up with them outpatient  Severe  sepsis secondary to healthcare associated pneumonia -Hospital he started having fevers and a mildly elevated white blood cell count, urine culture was negative.  CT of the chest showed no infiltrate.  He was started empirically on IV vancomycin and cefepime -He was also started on midodrine to help keep his blood pressures up while he was fluid resuscitated. -Is  able to wean off midodrine and he was continued on oral Augmentin as outpatient for a total of 8 days of antibiotics  Acute Kidney Injury  -Likely due to sepsis.  This resolved with IV fluids and midodrine.  Thrombocytosis -Not to be reactive, resolving upon discharge  Severe osteoarthritis of the right knee -CT of the knee was done that showed no acute fracture, some effusion, orthopedic surgery was consulted and recommended conservative management with pain control and physical therapy  Acute blood loss anemia -[place] secondary to hematuria, hemoglobin dropped to 8 and he was transfused 1 unit packed red blood cells hemoglobin remained stable upon discharge  Paroxysmal atrial fibrillation -Rate controlled on Coreg and continued on Coumadin  Chronic pain/fibromyalgia -Continued with Voltaren gel  History of BPH  - Continue Flomax  Essential Hypertension  -Coreg resumed, lisinopril held until follow-up with PCP.  Hyperlipidemia -Continue with statin  Today he reports that he is feeling the best he has felt in "a long time".  He reports that he has not had any fevers or chills since being discharged from the hospital.  Has had a couple of nosebleeds that he has been able to get controlled on his own.  Reports that he did not go home with a catheter, is urinating normally and has not noticed any blood in his urine.  He has not had any shortness of breath and this is nearly resolved.  He did not have to go on home oxygen.  Reports that his knee is still pretty painful and would like a short course of oxycodone  as a refill to help him get through his flareup.   Review of Systems  Constitutional: Negative.   HENT:  Positive for nosebleeds.   Eyes: Negative.   Respiratory: Negative.    Cardiovascular: Negative.   Gastrointestinal: Negative.   Endocrine: Negative.   Genitourinary: Negative.   Musculoskeletal:  Positive for arthralgias.  Skin: Negative.   Allergic/Immunologic:  Negative.   Neurological: Negative.   Hematological: Negative.   Psychiatric/Behavioral: Negative.    All other systems reviewed and are negative.  Past Medical History:  Diagnosis Date   Arthritis    HANDS AND PT STATES HIS ARMS HURT   Brain tumor [facility])    age 33 - brain tumor removed left- benign - occas slight headaches since the surgery   Colonic mass 2001   Fatigue    x 1 week o as of 02-17-2020   Fibromyalgia 1990's    Frequent PVCs    Generalized body aches    x 1 week as of 02-17-2020   H/O inguinal hernia    right side   Hernia, umbilical    History of kidney stones    HX OF MULTIPLE KIDNEY STONES AND  CURRENTLY HAS BILATERAL RENAL STONES CAUSING ABDOMINAL AND BACK PAIN   Hypertension    high blood pressure readings    NSVT (nonsustained ventricular tachycardia) (HCC)    Premature atrial contractions    Prolonged Q-T interval on ECG    hx of none currently   Wears dentures    full dentures    Social History   Socioeconomic History   Marital status: Married    Spouse name: Not on file   Number of children: Not on file   Years of education: Not on file   Highest education level: Not on file  Occupational History   Not on file  Tobacco Use   Smoking status: Never   Smokeless tobacco: Never  Vaping Use   Vaping status: Never Used  Substance and Sexual Activity   Alcohol use: Yes    Alcohol/week: 7.0 standard drinks of alcohol    Types: 7 Standard drinks or equivalent per week    Comment: 1 drink per day   Drug use: Not Currently   Sexual activity: Not on file  Other Topics Concern   Not on file  Social History Narrative   Retired from Engineer, manufacturing    Has one son - lives local    Likes to play Tennis   Social Determinants of Health   Financial Resource Strain: Low Risk  (05/06/2021)   Overall Financial Resource Strain (CARDIA)    Difficulty of Paying Living Expenses: Not hard at all  Food Insecurity: No Food Insecurity (10/06/2022)   Hunger Vital  Sign    Worried About Running Out of Food in the Last Year: Never true    Ran Out of Food in the Last Year: Never true  Transportation Needs: No Transportation Needs (10/06/2022)   PRAPARE - Administrator, Civil Service (Medical): No    Lack of Transportation (Non-Medical): No  Physical Activity: Insufficiently Active (05/06/2021)   Exercise Vital Sign    Days of Exercise per Week: 1 day    Minutes of Exercise per Session: 90 min  Stress: No Stress Concern Present (05/06/2021)   [employer] of Occupational Health - Occupational Stress Questionnaire    Feeling of Stress : Not at all  Social Connections: Moderately Isolated (05/06/2021)   Social Connection and Isolation Panel [NHANES]  Frequency of Communication with Friends and Family: More than three times a week    Frequency of Social Gatherings with Friends and Family: More than three times a week    Attends Religious Services: Never    Database administrator or Organizations: Yes    Attends Engineer, structural: More than 4 times per year    Marital Status: Never married  Intimate Partner Violence: Not At Risk (09/22/2022)   Humiliation, Afraid, Rape, and Kick questionnaire    Fear of Current or Ex-Partner: No    Emotionally Abused: No    Physically Abused: No    Sexually Abused: No    Past Surgical History:  Procedure Laterality Date   BRAIN TUMOR EXCISION  age 42   CARDIAC CATHETERIZATION N/A 04/09/2015   Procedure: Left Heart Cath and Coronary Angiography;  Surgeon: Kathleene Hazel, MD;  Location: [facility];  Service: Cardiovascular;  Laterality: N/A;   COLON SURGERY  2001   COLON RESECTION FOR GROWTH - NOT CANCER   CYSTOSCOPY WITH RETROGRADE PYELOGRAM, URETEROSCOPY AND STENT PLACEMENT Left 10/05/2012   Procedure: CYSTOSCOPY WITH RETROGRADE PYELOGRAM, URETEROSCOPY AND STENT PLACEMENT;  Surgeon: Sebastian Ache, MD;  Location: [facility];  Service: Urology;  Laterality: Left;    CYSTOSCOPY/RETROGRADE/URETEROSCOPY/STONE EXTRACTION WITH BASKET Right 10/05/2012   Procedure: CYSTOSCOPY/RETROGRADE/URETEROSCOPY/STONE EXTRACTION WITH BASKET;  Surgeon: Sebastian Ache, MD;  Location: [facility];  Service: Urology;  Laterality: Right;   CYSTOSCOPY/RETROGRADE/URETEROSCOPY/STONE EXTRACTION WITH BASKET Bilateral 10/24/2012   Procedure: CYSTOSCOPY/RETROGRADE/URETEROSCOPY/STONE EXTRACTION WITH BASKET;  Surgeon: Sebastian Ache, MD;  Location: [facility];  Service: Urology;  Laterality: Bilateral;  with bilateral stent placement   HOLMIUM LASER APPLICATION Right 10/05/2012   Procedure: HOLMIUM LASER APPLICATION;  Surgeon: Sebastian Ache, MD;  Location: [facility];  Service: Urology;  Laterality: Right;   HOLMIUM LASER APPLICATION Bilateral 10/24/2012   Procedure: HOLMIUM LASER APPLICATION;  Surgeon: Sebastian Ache, MD;  Location: [facility];  Service: Urology;  Laterality: Bilateral;   IR ANGIOGRAM PULMONARY BILATERAL SELECTIVE  09/21/2022   IR INFUSION THROMBOL ARTERIAL INITIAL (MS)  09/21/2022   IR INFUSION THROMBOL ARTERIAL INITIAL (MS)  09/21/2022   IR IVC FILTER PLMT / S&I Lenise Arena GUID/MOD SED  09/25/2022   IR THROMB F/U EVAL ART/VEN FINAL DAY (MS)  09/22/2022   IR US GUIDE VASC ACCESS RIGHT  09/21/2022   LITHOTRIPSY     TONSILLECTOMY  age 27's   and adenoids    Family History  Problem Relation Age of Onset   Colon cancer Father    Heart disease Father        quad bypass    Allergies  Allergen Reactions   Other Other (See Comments)    Mangos - caused a severe rash   Prozac [Fluoxetine Hcl]     Made him feel "loopy"   Sulfa Antibiotics Hives   Vicodin [Hydrocodone-Acetaminophen] Itching    Tolerates oxycodone   Erythromycin Rash    Many years ago an oral antibiotic caused a rash(wife and patient think it was erythromycin but they are not positive).    Current Outpatient Medications on File Prior to Visit  Medication Sig Dispense Refill   amoxicillin-clavulanate (AUGMENTIN) 875-125 MG tablet  Take 1 tablet by mouth every 12 (twelve) hours. 8 tablet 0   aspirin EC 81 MG tablet Take 1 tablet (81 mg total) by mouth daily. 90 tablet 3   buPROPion (WELLBUTRIN XL) 150 MG 24 hr tablet Take 1 tablet (150 mg total) by mouth daily. 90 tablet  1   carvedilol (COREG) 3.125 MG tablet Take 1 tablet (3.125 mg total) by mouth 2 (two) times daily. 180 tablet 1   Misc Natural Products (OSTEO BI-FLEX ADV JOINT SHIELD PO) Take 2 capsules by mouth daily.     Multiple Vitamin (MULTIVITAMIN) tablet Take 1 tablet by mouth daily.     ondansetron (ZOFRAN) 4 MG tablet Take 1 tablet (4 mg total) by mouth every 8 (eight) hours as needed for nausea or vomiting. 20 tablet 1   oxyCODONE (OXY IR/ROXICODONE) 5 MG immediate release tablet Take 1 tablet (5 mg total) by mouth every 3 (three) hours as needed for breakthrough pain. 15 tablet 0   tamsulosin (FLOMAX) 0.4 MG CAPS capsule Take 1 capsule (0.4 mg total) by mouth daily. 90 capsule 3   warfarin (COUMADIN) 2.5 MG tablet Take 1 tablet (2.5 mg total) by mouth daily. 30 tablet 0   atorvastatin (LIPITOR) 10 MG tablet Take 1 tablet (10 mg total) by mouth daily. (Patient not taking: Reported on 10/18/2022) 90 tablet 2   meclizine (ANTIVERT) 25 MG tablet Take 1 tablet (25 mg total) by mouth 3 (three) times daily as needed for dizziness. (Patient not taking: Reported on 10/18/2022) 30 tablet 2   No current facility-administered medications on file prior to visit.    BP (!) 142/78 (BP Location: Right Arm, Patient Position: Sitting, Cuff Size: Normal)   Pulse 70   Temp 97.6 F (36.4 C) (Oral)   Wt 157 lb (71.2 kg)   SpO2 98%   BMI 21.29 kg/m       Objective:   Physical Exam Vitals and nursing note reviewed.  Constitutional:      Appearance: Normal appearance.  Cardiovascular:     Rate and Rhythm: Normal rate and regular rhythm.     Pulses: Normal pulses.     Heart sounds: Normal heart sounds.  Pulmonary:     Effort: Pulmonary effort is normal.     Breath  sounds: Normal breath sounds.  Musculoskeletal:        General: Normal range of motion.  Skin:    General: Skin is warm and dry.  Neurological:     General: No focal deficit present.     Mental Status: He is alert and oriented to person, place, and time.     Motor: Weakness present.  Psychiatric:        Mood and Affect: Mood normal.        Behavior: Behavior normal.        Thought Content: Thought content normal.        Judgment: Judgment normal.        Assessment & Plan:  1. Acute hypoxemic respiratory failure [facility]) -Reviewed hospital notes, discharge instructions, labs, imaging, and medication changes with the patient.  All questions answered to the best of my ability.    2. Other acute pulmonary embolism with acute cor pulmonale (HCC) - Continue with coumadin. Will get referred to coumadin clinic  - Protime-INR; Future - CBC with Differential/Platelet; Future - Basic Metabolic Panel; Future - Ambulatory referral to Cardiology - Basic Metabolic Panel - CBC with Differential/Platelet - Protime-INR  3. Gross hematuria - Resolved  - Protime-INR; Future - CBC with Differential/Platelet; Future - Basic Metabolic Panel; Future - Basic Metabolic Panel - CBC with Differential/Platelet - Protime-INR  4. Sepsis without acute organ dysfunction, due to unspecified organism (HCC) - Resolved - CBC with Differential/Platelet; Future - Basic Metabolic Panel; Future - Basic Metabolic Panel - CBC with  Differential/Platelet  5. Healthcare-associated pneumonia - Completed abx  - Protime-INR; Future - CBC with Differential/Platelet; Future - Basic Metabolic Panel; Future - Basic Metabolic Panel - CBC with Differential/Platelet - Protime-INR  6. AKI (acute kidney injury) (HCC)  - Basic Metabolic Panel; Future - Basic Metabolic Panel  7. Thrombocytosis  - Protime-INR; Future - CBC with Differential/Platelet; Future - Basic Metabolic Panel; Future - Basic Metabolic  Panel - CBC with Differential/Platelet - Protime-INR  8. Primary osteoarthritis of right knee - Conisder referral to orthopedics. We will reevaluate in three weeks  - oxyCODONE (OXY IR/ROXICODONE) 5 MG immediate release tablet; Take 1 tablet (5 mg total) by mouth every 3 (three) hours as needed for breakthrough pain.  Dispense: 30 tablet; Refill: 0 - ondansetron (ZOFRAN-ODT) 4 MG disintegrating tablet; Take 1 tablet (4 mg total) by mouth every 8 (eight) hours as needed for nausea or vomiting.  Dispense: 20 tablet; Refill: 2  9. Acute blood loss anemia  - Protime-INR; Future - CBC with Differential/Platelet; Future - Basic Metabolic Panel; Future - Basic Metabolic Panel - CBC with Differential/Platelet - Protime-INR  10. PAF (paroxysmal atrial fibrillation) (HCC)  - Ambulatory referral to Cardiology  11. History of BPH - Continue with Flomax   12. Essential hypertension - slightly elevated but ok for age in the clinic today without lisinopril  - Will have him follow up in 3 weeks for reevaluation  - CBC with Differential/Platelet; Future - Basic Metabolic Panel; Future - Basic Metabolic Panel - CBC with Differential/Platelet  13. Mixed hyperlipidemia - Continue statin  - CBC with Differential/Platelet; Future - Basic Metabolic Panel; Future - Basic Metabolic Panel - CBC with Differential/Platelet

## 2022-10-18 NOTE — Addendum Note (Signed)
Addended by: Nancy Fetter on: 10/18/2022 06:14 AM   Modules accepted: Orders

## 2022-10-19 ENCOUNTER — Ambulatory Visit (INDEPENDENT_AMBULATORY_CARE_PROVIDER_SITE_OTHER): Payer: Medicare Other

## 2022-10-19 ENCOUNTER — Telehealth: Payer: Self-pay

## 2022-10-19 DIAGNOSIS — Z7901 Long term (current) use of anticoagulants: Secondary | ICD-10-CM

## 2022-10-19 NOTE — Progress Notes (Signed)
Contacted pt and advised of warfarin dosing.  A full discussion of the nature of anticoagulants has been carried out.  A benefit risk analysis has been presented to the patient, so that they understand the justification for choosing anticoagulation at this time. The need for frequent and regular monitoring, precise dosage adjustment and compliance is stressed.  Side effects of potential bleeding are discussed.  The patient should avoid any OTC items containing aspirin or ibuprofen, and should avoid great swings in general diet.  Avoid alcohol consumption.  Call if any signs of abnormal bleeding.    Pt has been scheduled for one week with the coumadin clinic. Answered all questions for pt and his wife. Provided coumadin clinic direct phone number. Pt readback dosing instructions. Pt verbalized understanding and his wife also did.       Increase dose today to take 1 1/2 tablets and increase dose tomorrow to take 1 1/2 tablets and then take 1 tablet daily except take 1 1/2 tablet on Monday. Recheck in 1 week at coumadin clinic apt.

## 2022-10-19 NOTE — Telephone Encounter (Signed)
Contacted pt and advised of warfarin dosing.  A full discussion of the nature of anticoagulants has been carried out.  A benefit risk analysis has been presented to the patient, so that they understand the justification for choosing anticoagulation at this time. The need for frequent and regular monitoring, precise dosage adjustment and compliance is stressed.  Side effects of potential bleeding are discussed.  The patient should avoid any OTC items containing aspirin or ibuprofen, and should avoid great swings in general diet.  Avoid alcohol consumption.  Call if any signs of abnormal bleeding.   Pt has been scheduled for one week with the coumadin clinic. Answered all questions for pt and his wife. Provided coumadin clinic direct phone number. Pt readback dosing instructions. Pt verbalized understanding and his wife also did.

## 2022-10-19 NOTE — Telephone Encounter (Signed)
-----   Message from [facility] sent at 10/19/2022  3:32 PM EDT ----- Regarding: RE: subtherapeutic INR Thank you and I am fine with that. Enrique Sack is supposed to be calling him with the rest of his lab results ----- Message ----- From: Sherrie George, RN Sent: 10/19/2022   1:52 PM EDT To: Shirline Frees, NP Subject: subtherapeutic INR                             I would recommend boosting two day, today and tomorrow, to 1 1/2 tablets (3.75 mg) each day and then change his weekly dose to take 1 tablet daily (2.5 mg) except take 1 1/2 tablets (3.75 mg) on Mondays. I think pt's INR was therapeutic in the hospital on 2.5 mg daily because he was on cefepime. This interacts with warfarin and will increase INR. I think the pt will need higher doses of warfarin now that he is no longer taking this abx. I would like to check him weekly for at least 3 more weeks and then could start spreading the coumadin clinic apts out to 2 weeks, 3 weeks and then every 4 weeks if he remains in range.  If you are ok with dosing suggested above I will contact pt and advise him of the dosing changes and schedule him for the coumadin clinic.  Thank you, Carollee Herter, RN ----- Message ----- From: Shirline Frees, NP Sent: 10/19/2022  12:22 PM EDT To: Sherrie George, RN  I would like to get this patient on the coumdin schedule. I checked his INR yesterday and it was low. I was going to bolus him by 2.5 or 5 mg do you think that would be ok ?  Thanks  [employer]

## 2022-10-19 NOTE — Patient Instructions (Addendum)
Pre visit review using our clinic review tool, if applicable. No additional management support is needed unless otherwise documented below in the visit note.  Increase dose today to take 1 1/2 tablets and increase dose tomorrow to take 1 1/2 tablets and then take 1 tablet daily except take 1 1/2 tablet on Monday. Recheck in 1 week at coumadin clinic apt.

## 2022-10-20 ENCOUNTER — Telehealth: Payer: Self-pay | Admitting: Adult Health

## 2022-10-20 DIAGNOSIS — Z483 Aftercare following surgery for neoplasm: Secondary | ICD-10-CM | POA: Diagnosis not present

## 2022-10-20 DIAGNOSIS — Z48812 Encounter for surgical aftercare following surgery on the circulatory system: Secondary | ICD-10-CM | POA: Diagnosis not present

## 2022-10-20 DIAGNOSIS — M1711 Unilateral primary osteoarthritis, right knee: Secondary | ICD-10-CM | POA: Diagnosis not present

## 2022-10-20 DIAGNOSIS — I2699 Other pulmonary embolism without acute cor pulmonale: Secondary | ICD-10-CM | POA: Diagnosis not present

## 2022-10-20 DIAGNOSIS — J181 Lobar pneumonia, unspecified organism: Secondary | ICD-10-CM | POA: Diagnosis not present

## 2022-10-20 DIAGNOSIS — A419 Sepsis, unspecified organism: Secondary | ICD-10-CM | POA: Diagnosis not present

## 2022-10-20 NOTE — Telephone Encounter (Signed)
Please advise looks like its coming from skilled nursing

## 2022-10-20 NOTE — Telephone Encounter (Signed)
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 Asking can they do silver alginate fiber dressing

## 2022-10-21 NOTE — Telephone Encounter (Signed)
[name] stated she did not need this anymore. [name] articulated that she was able to reach out to the plastic surgeon. No further action needed!

## 2022-10-26 ENCOUNTER — Ambulatory Visit (INDEPENDENT_AMBULATORY_CARE_PROVIDER_SITE_OTHER): Payer: Medicare Other

## 2022-10-26 DIAGNOSIS — Z7901 Long term (current) use of anticoagulants: Secondary | ICD-10-CM | POA: Diagnosis not present

## 2022-10-26 LAB — POCT INR: INR: 1.3 — AB (ref 2.0–3.0)

## 2022-10-26 MED ORDER — WARFARIN SODIUM 2.5 MG PO TABS
ORAL_TABLET | ORAL | 1 refills | Status: DC
Start: 2022-10-26 — End: 2022-11-23

## 2022-10-26 NOTE — Progress Notes (Addendum)
A full discussion of the nature of anticoagulants has been carried out.  A benefit risk analysis has been presented to the patient, so that they understand the justification for choosing anticoagulation at this time. The need for frequent and regular monitoring, precise dosage adjustment and compliance is stressed.  Side effects of potential bleeding are discussed.  The patient should avoid any OTC items containing aspirin or ibuprofen, and should avoid great swings in general diet.  Avoid alcohol consumption.  Call if any signs of abnormal bleeding.  Increase dose today to take 1 1/2 tablets and increase dose tomorrow to take 1 1/2 tablets and then take 1 tablet daily except take 1 1/2 tablet on Monday, Wednesday and Friday. Recheck in 1 week at coumadin clinic apt.   Pt is compliant with warfarin management and PCP apts.  Sent in refill of warfarin to requested pharmacy.

## 2022-10-26 NOTE — Addendum Note (Signed)
Addended by: Sherrie George A on: 10/26/2022 01:38 PM   Modules accepted: Orders

## 2022-10-26 NOTE — Patient Instructions (Addendum)
Pre visit review using our clinic review tool, if applicable. No additional management support is needed unless otherwise documented below in the visit note.   This is a very helpful website with a lot of information.   [email or web address].[email or web address]  Increase dose today to take 1 1/2 tablets and increase dose tomorrow to take 1 1/2 tablets and then take 1 tablet daily except take 1 1/2 tablet on Monday, Wednesday and Friday. Recheck in 1 week at coumadin clinic apt.

## 2022-10-27 DIAGNOSIS — Z48812 Encounter for surgical aftercare following surgery on the circulatory system: Secondary | ICD-10-CM | POA: Diagnosis not present

## 2022-10-27 DIAGNOSIS — A419 Sepsis, unspecified organism: Secondary | ICD-10-CM | POA: Diagnosis not present

## 2022-10-27 DIAGNOSIS — M1711 Unilateral primary osteoarthritis, right knee: Secondary | ICD-10-CM | POA: Diagnosis not present

## 2022-10-27 DIAGNOSIS — J181 Lobar pneumonia, unspecified organism: Secondary | ICD-10-CM | POA: Diagnosis not present

## 2022-10-27 DIAGNOSIS — I2699 Other pulmonary embolism without acute cor pulmonale: Secondary | ICD-10-CM | POA: Diagnosis not present

## 2022-10-27 DIAGNOSIS — Z483 Aftercare following surgery for neoplasm: Secondary | ICD-10-CM | POA: Diagnosis not present

## 2022-10-28 DIAGNOSIS — Z48812 Encounter for surgical aftercare following surgery on the circulatory system: Secondary | ICD-10-CM | POA: Diagnosis not present

## 2022-10-28 DIAGNOSIS — A419 Sepsis, unspecified organism: Secondary | ICD-10-CM | POA: Diagnosis not present

## 2022-10-28 DIAGNOSIS — I2699 Other pulmonary embolism without acute cor pulmonale: Secondary | ICD-10-CM | POA: Diagnosis not present

## 2022-10-28 DIAGNOSIS — Z483 Aftercare following surgery for neoplasm: Secondary | ICD-10-CM | POA: Diagnosis not present

## 2022-10-28 DIAGNOSIS — M1711 Unilateral primary osteoarthritis, right knee: Secondary | ICD-10-CM | POA: Diagnosis not present

## 2022-10-28 DIAGNOSIS — J181 Lobar pneumonia, unspecified organism: Secondary | ICD-10-CM | POA: Diagnosis not present

## 2022-10-31 ENCOUNTER — Encounter: Payer: Medicare Other | Admitting: Student

## 2022-11-01 ENCOUNTER — Telehealth: Payer: Self-pay

## 2022-11-01 DIAGNOSIS — Z7901 Long term (current) use of anticoagulants: Secondary | ICD-10-CM

## 2022-11-01 NOTE — Telephone Encounter (Signed)
Pt returned call. Advised a lab draw would be necessary due to coumadin nurse out of the office tomorrow. [facility] will not be available tomorrow, 9/25, so pt will go to [facility] for lab PT/INR at 3 pm.  Advised pt the result for the lab will not return until Thursday morning, 9/26, and this nurse will call him with dosing instructions. Pt denies any changes. Advised if any changes to contact the coumadin clinic. Pt verbalized understanding.   Added lab order for PT/INR

## 2022-11-01 NOTE — Telephone Encounter (Signed)
Ned to RS pt's coumadin clinic apt for tomorrow due to nurse being out of the office.  LVM requesting call back. Would like to make a lab apt for pt to have lab PT/INR tomorrow.

## 2022-11-02 ENCOUNTER — Ambulatory Visit: Payer: Medicare Other

## 2022-11-02 ENCOUNTER — Other Ambulatory Visit: Payer: Medicare Other

## 2022-11-02 DIAGNOSIS — Z7901 Long term (current) use of anticoagulants: Secondary | ICD-10-CM | POA: Diagnosis not present

## 2022-11-02 LAB — PROTIME-INR
INR: 1.2 ratio — ABNORMAL HIGH (ref 0.8–1.0)
Prothrombin Time: 13 s (ref 9.6–13.1)

## 2022-11-03 ENCOUNTER — Ambulatory Visit (INDEPENDENT_AMBULATORY_CARE_PROVIDER_SITE_OTHER): Payer: Medicare Other

## 2022-11-03 DIAGNOSIS — Z7901 Long term (current) use of anticoagulants: Secondary | ICD-10-CM | POA: Diagnosis not present

## 2022-11-03 DIAGNOSIS — A419 Sepsis, unspecified organism: Secondary | ICD-10-CM | POA: Diagnosis not present

## 2022-11-03 DIAGNOSIS — M1711 Unilateral primary osteoarthritis, right knee: Secondary | ICD-10-CM | POA: Diagnosis not present

## 2022-11-03 DIAGNOSIS — Z48812 Encounter for surgical aftercare following surgery on the circulatory system: Secondary | ICD-10-CM | POA: Diagnosis not present

## 2022-11-03 DIAGNOSIS — Z483 Aftercare following surgery for neoplasm: Secondary | ICD-10-CM | POA: Diagnosis not present

## 2022-11-03 DIAGNOSIS — J181 Lobar pneumonia, unspecified organism: Secondary | ICD-10-CM | POA: Diagnosis not present

## 2022-11-03 DIAGNOSIS — I2699 Other pulmonary embolism without acute cor pulmonale: Secondary | ICD-10-CM | POA: Diagnosis not present

## 2022-11-03 NOTE — Progress Notes (Signed)
Pt had lab draw PT/INR yesterday. Result received today. Increase dose today to take 1 1/2 tablets and increase dose tomorrow to take 2 tablets and then change weekly dose to take 1 1/2 tablets daily. Recheck in 1 week at coumadin clinic apt.  Contacted pt by phone with dosing instructions and next check date. Pt wrote down instructions and verbalized understanding.

## 2022-11-03 NOTE — Patient Instructions (Addendum)
Pre visit review using our clinic review tool, if applicable. No additional management support is needed unless otherwise documented below in the visit note.  Increase dose today to take 1 1/2 tablets and increase dose tomorrow to take 2 tablets and then change weekly dose to take 1 1/2 tablets daily. Recheck in 1 week at coumadin clinic apt.

## 2022-11-07 DIAGNOSIS — M25461 Effusion, right knee: Secondary | ICD-10-CM | POA: Diagnosis not present

## 2022-11-07 DIAGNOSIS — J9601 Acute respiratory failure with hypoxia: Secondary | ICD-10-CM | POA: Diagnosis not present

## 2022-11-07 DIAGNOSIS — I700 Atherosclerosis of aorta: Secondary | ICD-10-CM | POA: Diagnosis not present

## 2022-11-07 DIAGNOSIS — D630 Anemia in neoplastic disease: Secondary | ICD-10-CM | POA: Diagnosis not present

## 2022-11-07 DIAGNOSIS — I110 Hypertensive heart disease with heart failure: Secondary | ICD-10-CM | POA: Diagnosis not present

## 2022-11-07 DIAGNOSIS — G8929 Other chronic pain: Secondary | ICD-10-CM | POA: Diagnosis not present

## 2022-11-07 DIAGNOSIS — J181 Lobar pneumonia, unspecified organism: Secondary | ICD-10-CM | POA: Diagnosis not present

## 2022-11-07 DIAGNOSIS — I501 Left ventricular failure: Secondary | ICD-10-CM | POA: Diagnosis not present

## 2022-11-07 DIAGNOSIS — K429 Umbilical hernia without obstruction or gangrene: Secondary | ICD-10-CM | POA: Diagnosis not present

## 2022-11-07 DIAGNOSIS — N202 Calculus of kidney with calculus of ureter: Secondary | ICD-10-CM | POA: Diagnosis not present

## 2022-11-07 DIAGNOSIS — K4090 Unilateral inguinal hernia, without obstruction or gangrene, not specified as recurrent: Secondary | ICD-10-CM | POA: Diagnosis not present

## 2022-11-07 DIAGNOSIS — N400 Enlarged prostate without lower urinary tract symptoms: Secondary | ICD-10-CM | POA: Diagnosis not present

## 2022-11-07 DIAGNOSIS — C4359 Malignant melanoma of other part of trunk: Secondary | ICD-10-CM | POA: Diagnosis not present

## 2022-11-07 DIAGNOSIS — Z48812 Encounter for surgical aftercare following surgery on the circulatory system: Secondary | ICD-10-CM | POA: Diagnosis not present

## 2022-11-07 DIAGNOSIS — D62 Acute posthemorrhagic anemia: Secondary | ICD-10-CM | POA: Diagnosis not present

## 2022-11-07 DIAGNOSIS — M7121 Synovial cyst of popliteal space [Baker], right knee: Secondary | ICD-10-CM | POA: Diagnosis not present

## 2022-11-07 DIAGNOSIS — M797 Fibromyalgia: Secondary | ICD-10-CM | POA: Diagnosis not present

## 2022-11-07 DIAGNOSIS — I480 Paroxysmal atrial fibrillation: Secondary | ICD-10-CM | POA: Diagnosis not present

## 2022-11-07 DIAGNOSIS — I491 Atrial premature depolarization: Secondary | ICD-10-CM | POA: Diagnosis not present

## 2022-11-07 DIAGNOSIS — Z483 Aftercare following surgery for neoplasm: Secondary | ICD-10-CM | POA: Diagnosis not present

## 2022-11-07 DIAGNOSIS — A419 Sepsis, unspecified organism: Secondary | ICD-10-CM | POA: Diagnosis not present

## 2022-11-07 DIAGNOSIS — M1711 Unilateral primary osteoarthritis, right knee: Secondary | ICD-10-CM | POA: Diagnosis not present

## 2022-11-07 DIAGNOSIS — R918 Other nonspecific abnormal finding of lung field: Secondary | ICD-10-CM | POA: Diagnosis not present

## 2022-11-07 DIAGNOSIS — E785 Hyperlipidemia, unspecified: Secondary | ICD-10-CM | POA: Diagnosis not present

## 2022-11-07 DIAGNOSIS — I2699 Other pulmonary embolism without acute cor pulmonale: Secondary | ICD-10-CM | POA: Diagnosis not present

## 2022-11-08 ENCOUNTER — Ambulatory Visit (INDEPENDENT_AMBULATORY_CARE_PROVIDER_SITE_OTHER): Payer: Medicare Other | Admitting: Adult Health

## 2022-11-08 ENCOUNTER — Encounter: Payer: Self-pay | Admitting: Adult Health

## 2022-11-08 VITALS — BP 120/86 | HR 72 | Temp 97.8°F | Ht 72.0 in | Wt 158.0 lb

## 2022-11-08 DIAGNOSIS — M1711 Unilateral primary osteoarthritis, right knee: Secondary | ICD-10-CM | POA: Diagnosis not present

## 2022-11-08 DIAGNOSIS — F419 Anxiety disorder, unspecified: Secondary | ICD-10-CM

## 2022-11-08 MED ORDER — TRAMADOL HCL 50 MG PO TABS
50.0000 mg | ORAL_TABLET | Freq: Two times a day (BID) | ORAL | 0 refills | Status: DC | PRN
Start: 2022-11-08 — End: 2022-11-22

## 2022-11-08 MED ORDER — BUPROPION HCL ER (XL) 300 MG PO TB24: 300.0000 mg | ORAL_TABLET | Freq: Every day | ORAL | 1 refills | Status: DC

## 2022-11-08 NOTE — Progress Notes (Signed)
Subjective:    Patient ID: [name], male    DOB: [birth year], 82 y.o.   MRN: [id number]  HPI 82 year old male who  has a past medical history of Arthritis, Brain tumor (HCC), Colonic mass (2001), Fatigue, Fibromyalgia (1990's ), Frequent PVCs, Generalized body aches, H/O inguinal hernia, Hernia, umbilical, History of kidney stones, Hypertension, NSVT (nonsustained ventricular tachycardia) (HCC), Premature atrial contractions, Prolonged Q-T interval on ECG, and Wears dentures.  Presents to the office today for 3-week follow-up regarding severe osteoarthritis of the right knee.  During her recent hospital admission for acute respiratory failure he was complaining of severe osteoarthritis of the right knee.  He had a CT scan that showed no acute fracture, moderate to large knee joint effusion with numerous intra-arterial or loose bodies, severe tricompartmental osteoarthritis of the right knee.  Orthopedics was consulted and recommended physical therapy and pain management.  Was kept on oxycodone 5 mg every 3 hours as needed for breakthrough pain.  Today he reports that his pain is no better but is not worse either. He has started walking short distances without a cane. He feels as though the oxycodone is causing nausea. He has not had any vomiting. He has had to take zofran when he takes oxycodone.   He is also taking Wellbutrin for anxiety. He has not had any side effects from this medication and feels as though it has helped but still has anxiety. He reports having to keep doors open in rooms or he feels as though he is getting boxed in.    Review of Systems See HPI   Past Medical History:  Diagnosis Date   Arthritis    HANDS AND PT STATES HIS ARMS HURT   Brain tumor [employer])    age 17 - brain tumor removed left- benign - occas slight headaches since the surgery   Colonic mass 2001   Fatigue    x 1 week o as of 02-17-2020   Fibromyalgia 1990's    Frequent PVCs    Generalized body  aches    x 1 week as of 02-17-2020   H/O inguinal hernia    right side   Hernia, umbilical    History of kidney stones    HX OF MULTIPLE KIDNEY STONES AND  CURRENTLY HAS BILATERAL RENAL STONES CAUSING ABDOMINAL AND BACK PAIN   Hypertension    high blood pressure readings    NSVT (nonsustained ventricular tachycardia) (HCC)    Premature atrial contractions    Prolonged Q-T interval on ECG    hx of none currently   Wears dentures    full dentures    Social History   Socioeconomic History   Marital status: Married    Spouse name: Not on file   Number of children: Not on file   Years of education: Not on file   Highest education level: Not on file  Occupational History   Not on file  Tobacco Use   Smoking status: Never   Smokeless tobacco: Never  Vaping Use   Vaping status: Never Used  Substance and Sexual Activity   Alcohol use: Yes    Alcohol/week: 7.0 standard drinks of alcohol    Types: 7 Standard drinks or equivalent per week    Comment: 1 drink per day   Drug use: Not Currently   Sexual activity: Not on file  Other Topics Concern   Not on file  Social History Narrative   Retired from Engineer, manufacturing  Has one son - lives local    Likes to play Tennis   Social Determinants of Health   Financial Resource Strain: Low Risk  (05/06/2021)   Overall Financial Resource Strain (CARDIA)    Difficulty of Paying Living Expenses: Not hard at all  Food Insecurity: No Food Insecurity (10/06/2022)   Hunger Vital Sign    Worried About Running Out of Food in the Last Year: Never true    Ran Out of Food in the Last Year: Never true  Transportation Needs: No Transportation Needs (10/06/2022)   PRAPARE - Administrator, Civil Service (Medical): No    Lack of Transportation (Non-Medical): No  Physical Activity: Insufficiently Active (05/06/2021)   Exercise Vital Sign    Days of Exercise per Week: 1 day    Minutes of Exercise per Session: 90 min  Stress: No Stress  Concern Present (05/06/2021)   [employer] of Occupational Health - Occupational Stress Questionnaire    Feeling of Stress : Not at all  Social Connections: Moderately Isolated (05/06/2021)   Social Connection and Isolation Panel [NHANES]    Frequency of Communication with Friends and Family: More than three times a week    Frequency of Social Gatherings with Friends and Family: More than three times a week    Attends Religious Services: Never    Database administrator or Organizations: Yes    Attends Engineer, structural: More than 4 times per year    Marital Status: Never married  Intimate Partner Violence: Not At Risk (09/22/2022)   Humiliation, Afraid, Rape, and Kick questionnaire    Fear of Current or Ex-Partner: No    Emotionally Abused: No    Physically Abused: No    Sexually Abused: No    Past Surgical History:  Procedure Laterality Date   BRAIN TUMOR EXCISION  age 43   CARDIAC CATHETERIZATION N/A 04/09/2015   Procedure: Left Heart Cath and Coronary Angiography;  Surgeon: Kathleene Hazel, MD;  Location: [facility];  Service: Cardiovascular;  Laterality: N/A;   COLON SURGERY  2001   COLON RESECTION FOR GROWTH - NOT CANCER   CYSTOSCOPY WITH RETROGRADE PYELOGRAM, URETEROSCOPY AND STENT PLACEMENT Left 10/05/2012   Procedure: CYSTOSCOPY WITH RETROGRADE PYELOGRAM, URETEROSCOPY AND STENT PLACEMENT;  Surgeon: Sebastian Ache, MD;  Location: [facility];  Service: Urology;  Laterality: Left;   CYSTOSCOPY/RETROGRADE/URETEROSCOPY/STONE EXTRACTION WITH BASKET Right 10/05/2012   Procedure: CYSTOSCOPY/RETROGRADE/URETEROSCOPY/STONE EXTRACTION WITH BASKET;  Surgeon: Sebastian Ache, MD;  Location: [facility];  Service: Urology;  Laterality: Right;   CYSTOSCOPY/RETROGRADE/URETEROSCOPY/STONE EXTRACTION WITH BASKET Bilateral 10/24/2012   Procedure: CYSTOSCOPY/RETROGRADE/URETEROSCOPY/STONE EXTRACTION WITH BASKET;  Surgeon: Sebastian Ache, MD;  Location: [facility];  Service: Urology;   Laterality: Bilateral;  with bilateral stent placement   HOLMIUM LASER APPLICATION Right 10/05/2012   Procedure: HOLMIUM LASER APPLICATION;  Surgeon: Sebastian Ache, MD;  Location: [facility];  Service: Urology;  Laterality: Right;   HOLMIUM LASER APPLICATION Bilateral 10/24/2012   Procedure: HOLMIUM LASER APPLICATION;  Surgeon: Sebastian Ache, MD;  Location: [facility];  Service: Urology;  Laterality: Bilateral;   IR ANGIOGRAM PULMONARY BILATERAL SELECTIVE  09/21/2022   IR INFUSION THROMBOL ARTERIAL INITIAL (MS)  09/21/2022   IR INFUSION THROMBOL ARTERIAL INITIAL (MS)  09/21/2022   IR IVC FILTER PLMT / S&I Lenise Arena GUID/MOD SED  09/25/2022   IR THROMB F/U EVAL ART/VEN FINAL DAY (MS)  09/22/2022   IR US GUIDE VASC ACCESS RIGHT  09/21/2022   LITHOTRIPSY  TONSILLECTOMY  age 30's   and adenoids    Family History  Problem Relation Age of Onset   Colon cancer Father    Heart disease Father        quad bypass    Allergies  Allergen Reactions   Other Other (See Comments)    Mangos - caused a severe rash   Prozac [Fluoxetine Hcl]     Made him feel "loopy"   Sulfa Antibiotics Hives   Vicodin [Hydrocodone-Acetaminophen] Itching    Tolerates oxycodone   Erythromycin Rash    Many years ago an oral antibiotic caused a rash(wife and patient think it was erythromycin but they are not positive).    Current Outpatient Medications on File Prior to Visit  Medication Sig Dispense Refill   aspirin EC 81 MG tablet Take 1 tablet (81 mg total) by mouth daily. 90 tablet 3   atorvastatin (LIPITOR) 10 MG tablet Take 1 tablet (10 mg total) by mouth daily. 90 tablet 2   carvedilol (COREG) 3.125 MG tablet Take 1 tablet (3.125 mg total) by mouth 2 (two) times daily. 180 tablet 1   Misc Natural Products (OSTEO BI-FLEX ADV JOINT SHIELD PO) Take 2 capsules by mouth daily.     Multiple Vitamin (MULTIVITAMIN) tablet Take 1 tablet by mouth daily.     ondansetron (ZOFRAN-ODT) 4 MG disintegrating tablet Take 1 tablet (4 mg  total) by mouth every 8 (eight) hours as needed for nausea or vomiting. 20 tablet 2   tamsulosin (FLOMAX) 0.4 MG CAPS capsule Take 1 capsule (0.4 mg total) by mouth daily. 90 capsule 3   warfarin (COUMADIN) 2.5 MG tablet TAKE 1 TABLET BY MOUTH DAILY EXCEPT TAKE 1 1/2 TABLETS ON MONDAY, WEDNESDAY AND FRIDAY OR AS DIRECTED BY ANTICOAGULATION CLINIC 50 tablet 1   No current facility-administered medications on file prior to visit.    BP 120/86   Pulse 72   Temp 97.8 F (36.6 C) (Oral)   Ht 6' (1.829 m)   Wt 158 lb (71.7 kg)   SpO2 94%   BMI 21.43 kg/m       Objective:   Physical Exam Vitals and nursing note reviewed.  Constitutional:      Appearance: Normal appearance.  Cardiovascular:     Rate and Rhythm: Normal rate and regular rhythm.     Pulses: Normal pulses.     Heart sounds: Normal heart sounds.  Musculoskeletal:        General: Normal range of motion.  Skin:    General: Skin is warm and dry.     Capillary Refill: Capillary refill takes less than 2 seconds.  Neurological:     General: No focal deficit present.     Mental Status: He is alert and oriented to person, place, and time.     Gait: Gait abnormal.  Psychiatric:        Mood and Affect: Mood normal.        Behavior: Behavior normal.        Thought Content: Thought content normal.        Judgment: Judgment normal.           Assessment & Plan:  1. Primary osteoarthritis of right knee - Will d/c oxycodone and switch to Tramadol.  - Referral to orthopedics.  - traMADol (ULTRAM) 50 MG tablet; Take 1 tablet (50 mg total) by mouth every 12 (twelve) hours as needed.  Dispense: 60 tablet; Refill: 0 - AMB referral to orthopedics  2. Anxiety -  Will increase Wellbutrin to 300 mg.  - He will follow up if anxiety continues to be an issue, - buPROPion (WELLBUTRIN XL) 300 MG 24 hr tablet; Take 1 tablet (300 mg total) by mouth daily.  Dispense: 90 tablet; Refill: 1   Time spent with patient today was 31 minutes  which consisted of chart review, discussing chronic right knee pain and anxiety, work up, treatment answering questions and documentation.

## 2022-11-09 ENCOUNTER — Ambulatory Visit (INDEPENDENT_AMBULATORY_CARE_PROVIDER_SITE_OTHER): Payer: Medicare Other

## 2022-11-09 DIAGNOSIS — Z7901 Long term (current) use of anticoagulants: Secondary | ICD-10-CM | POA: Diagnosis not present

## 2022-11-09 LAB — POCT INR: INR: 1.3 — AB (ref 2.0–3.0)

## 2022-11-09 NOTE — Patient Instructions (Addendum)
Pre visit review using our clinic review tool, if applicable. No additional management support is needed unless otherwise documented below in the visit note.  Increase dose today to take 2 tablets and increase dose tomorrow to take 2 tablets and then change weekly dose to take 2 tablets daily except take 1 1/2 tablets on Mondays and Thursdays. Recheck in 1 week.

## 2022-11-09 NOTE — Progress Notes (Signed)
Increase dose today to take 2 tablets and increase dose tomorrow to take 2 tablets and then change weekly dose to take 2 tablets daily except take 1 1/2 tablets on Mondays and Thursdays. Recheck in 1 week.

## 2022-11-10 DIAGNOSIS — Z483 Aftercare following surgery for neoplasm: Secondary | ICD-10-CM | POA: Diagnosis not present

## 2022-11-10 DIAGNOSIS — M1711 Unilateral primary osteoarthritis, right knee: Secondary | ICD-10-CM | POA: Diagnosis not present

## 2022-11-10 DIAGNOSIS — A419 Sepsis, unspecified organism: Secondary | ICD-10-CM | POA: Diagnosis not present

## 2022-11-10 DIAGNOSIS — Z48812 Encounter for surgical aftercare following surgery on the circulatory system: Secondary | ICD-10-CM | POA: Diagnosis not present

## 2022-11-10 DIAGNOSIS — J181 Lobar pneumonia, unspecified organism: Secondary | ICD-10-CM | POA: Diagnosis not present

## 2022-11-10 DIAGNOSIS — I2699 Other pulmonary embolism without acute cor pulmonale: Secondary | ICD-10-CM | POA: Diagnosis not present

## 2022-11-16 ENCOUNTER — Other Ambulatory Visit: Payer: Self-pay | Admitting: Adult Health

## 2022-11-16 ENCOUNTER — Telehealth: Payer: Self-pay

## 2022-11-16 ENCOUNTER — Ambulatory Visit (INDEPENDENT_AMBULATORY_CARE_PROVIDER_SITE_OTHER): Payer: Medicare Other

## 2022-11-16 DIAGNOSIS — Z7901 Long term (current) use of anticoagulants: Secondary | ICD-10-CM

## 2022-11-16 LAB — POCT INR: INR: 1.8 — AB (ref 2.0–3.0)

## 2022-11-16 MED ORDER — BUPROPION HCL ER (XL) 150 MG PO TB24: 150.0000 mg | ORAL_TABLET | Freq: Every day | ORAL | 0 refills | Status: DC

## 2022-11-16 NOTE — Progress Notes (Addendum)
Pt reports he used to take Bioflex which contains glucosamine and chondroitin. This has a minor interaction with warfarin. He is going to start taking it again tomorrow.  Pt also reports he was prescribed tramadol but he has a side effect, of dizziness. He reports bupropion dose was increased at last OV also and he is also having dizziness from that. He has stopped taking oxycodone also due to dizziness. Pt is currently using Tylenol arthritis. Pt would like to go back to prior dose of bupropion and inquired how he should do that. Advised a msg would be sent to his PCP to request instructions for going to prior dosing of bupropion. Advised someone from the office will contact him concerning the medication. Pt verbalized understanding and was appreciative of the help.  Msg sent to PCP. Increase dose today to take 3 tablets and then change weekly dose to take 2 tablets daily. Recheck in 1 week.

## 2022-11-16 NOTE — Patient Instructions (Addendum)
Pre visit review using our clinic review tool, if applicable. No additional management support is needed unless otherwise documented below in the visit note.  Increase dose today to take 3 tablets and then change weekly dose to take 2 tablets daily. Recheck in 1 week.

## 2022-11-16 NOTE — Telephone Encounter (Signed)
Noted  

## 2022-11-16 NOTE — Telephone Encounter (Signed)
Pt was in coumadin clinic today and reported he was prescribed tramadol but he stopped taking it due to dizziness. He has stopped taking oxycodone also due to dizziness. Pt is currently using Tylenol arthritis.     He reports bupropion dose was increased (doubled) at last OV also and he is also having dizziness from the increased dose.   Pt would like to go back to prior dose of bupropion and inquired how he should do that. Advised a msg would be sent to his PCP to request instructions for going to prior dosing of bupropion. Advised someone from the office will contact him concerning the medication. Pt verbalized understanding and was appreciative of the help.

## 2022-11-17 DIAGNOSIS — J181 Lobar pneumonia, unspecified organism: Secondary | ICD-10-CM | POA: Diagnosis not present

## 2022-11-17 DIAGNOSIS — I2699 Other pulmonary embolism without acute cor pulmonale: Secondary | ICD-10-CM | POA: Diagnosis not present

## 2022-11-17 DIAGNOSIS — Z483 Aftercare following surgery for neoplasm: Secondary | ICD-10-CM | POA: Diagnosis not present

## 2022-11-17 DIAGNOSIS — M1711 Unilateral primary osteoarthritis, right knee: Secondary | ICD-10-CM | POA: Diagnosis not present

## 2022-11-17 DIAGNOSIS — A419 Sepsis, unspecified organism: Secondary | ICD-10-CM | POA: Diagnosis not present

## 2022-11-17 DIAGNOSIS — Z48812 Encounter for surgical aftercare following surgery on the circulatory system: Secondary | ICD-10-CM | POA: Diagnosis not present

## 2022-11-22 ENCOUNTER — Encounter: Payer: Self-pay | Admitting: Cardiology

## 2022-11-22 ENCOUNTER — Other Ambulatory Visit: Payer: Self-pay | Admitting: Adult Health

## 2022-11-22 ENCOUNTER — Ambulatory Visit: Payer: Medicare Other | Attending: Cardiology | Admitting: Cardiology

## 2022-11-22 VITALS — BP 130/84 | HR 91 | Resp 16 | Ht 72.0 in | Wt 165.7 lb

## 2022-11-22 DIAGNOSIS — E782 Mixed hyperlipidemia: Secondary | ICD-10-CM | POA: Diagnosis not present

## 2022-11-22 DIAGNOSIS — I480 Paroxysmal atrial fibrillation: Secondary | ICD-10-CM | POA: Diagnosis not present

## 2022-11-22 DIAGNOSIS — D6869 Other thrombophilia: Secondary | ICD-10-CM | POA: Insufficient documentation

## 2022-11-22 DIAGNOSIS — I2602 Saddle embolus of pulmonary artery with acute cor pulmonale: Secondary | ICD-10-CM | POA: Diagnosis not present

## 2022-11-22 DIAGNOSIS — I10 Essential (primary) hypertension: Secondary | ICD-10-CM | POA: Insufficient documentation

## 2022-11-22 DIAGNOSIS — Z7901 Long term (current) use of anticoagulants: Secondary | ICD-10-CM

## 2022-11-22 MED ORDER — ATORVASTATIN CALCIUM 20 MG PO TABS
20.0000 mg | ORAL_TABLET | Freq: Every day | ORAL | 0 refills | Status: DC
Start: 2022-11-22 — End: 2023-04-27

## 2022-11-22 NOTE — Progress Notes (Unsigned)
Cardiology Office Note:  .   Date:  11/24/2022  ID:  [name], DOB January 06, 1941, MRN [id number] PCP: Alex Frees, NP  [name] Cardiologist:  Yates Decamp, MD    History of Present Illness: .   [name] is a 82 y.o. Caucasian male patient with primary hypertension, presented with marked dyspnea on exertion, hypoxemic respiratory failure on 09/22/2022 and near syncope, found to have submassive pulmonary embolism with RV strain on echocardiogram, underwent direct thrombolysis but did develop hematuria and acute renal insufficiency fortunately he recuperated well, he was transition to warfarin to be discharged home with outpatient follow-up patient also developed atrial fibrillation while in the hospital blood loss anemia.  He does have a history of kidney stones.  Patient also has history of frequent PACs and PVCs and episodes of NSVT noted on Holter monitor along with mild LV systolic dysfunction was evaluated by EP at that time and recommended continued medical therapy  Discussed the use of AI scribe software for clinical note transcription with the patient, who gave verbal consent to proceed.  History of Present Illness   The patient, with a history of pulmonary embolism and deep vein thrombosis, presents for a follow-up visit after hospitalization.  He was unaware of the presence of a clot in her leg, which was discovered during her hospital stay.  He reports no recent periods of immobility or travel that could have contributed to the formation of the clots. She has been active, working in her [employer] fast [employer] and [employer]  The patient reports that her breathing has improved significantly since her hospitalization and she feels like he is back to her normal self, except for knee pain. She has a history of being very active, playing basketball and tennis for many years, and she believes this has contributed to he current knee problems.  He is  scheduled to have her knees x-rayed next month as he is bone to bone.  The patient also reports having ventral hernia and inguinal hernia, which was supposed to be operated on, but the surgery was postponed due to illness.  He has not rescheduled the surgery yet.  He also has a history of kidney stones and has passed a couple of small ones about a month ago.  The patient is currently on warfarin for her blood clots and is experiencing bruising, which she attributes to the medication.  He also takes a low dose of aspirin daily, which the doctor advised her to stop.  He was previously on atorvastatin for cholesterol, but is not currently taking it.      Review of Systems  Cardiovascular:  Negative for chest pain, dyspnea on exertion and leg swelling.    Risk Assessment/Calculations:         Lab Results  Component Value Date   CHOL 219 (H) 10/21/2021   HDL 38.50 (L) 10/21/2021   LDLCALC 148 (H) 10/21/2021   TRIG 162.0 (H) 10/21/2021   CHOLHDL 6 10/21/2021   Lab Results  Component Value Date   NA 139 10/18/2022   K 4.6 10/18/2022   CO2 29 10/18/2022   GLUCOSE 90 10/18/2022   BUN 16 10/18/2022   CREATININE 1.02 10/18/2022   CALCIUM 9.6 10/18/2022   GFR 69.09 10/18/2022   GFRNONAA >60 10/02/2022   Lab Results  Component Value Date   WBC 6.5 10/18/2022   HGB 12.3 (L) 10/18/2022   HCT 39.0 10/18/2022   MCV 97.1 10/18/2022   PLT 520.0 (H)  10/18/2022      Latest Ref Rng & Units 10/18/2022   11:03 AM 10/05/2022    6:55 AM 10/04/2022    5:31 AM  CBC  WBC 4.0 - 10.5 K/uL 6.5  7.2  7.7   Hemoglobin 13.0 - 17.0 g/dL 16.6  8.8  8.2   Hematocrit 39.0 - 52.0 % 39.0  27.9  25.7   Platelets 150.0 - 400.0 K/uL 520.0  519  483      Physical Exam:   VS:  BP 130/84 (BP Location: Left Arm, Patient Position: Sitting, Cuff Size: Normal)   Pulse 91   Resp 16   Ht 6' (1.829 m)   Wt 165 lb 11.2 oz (75.2 kg)   SpO2 95%   BMI 22.47 kg/m    Wt Readings from Last 3 Encounters:  11/22/22  165 lb 11.2 oz (75.2 kg)  11/08/22 158 lb (71.7 kg)  10/18/22 157 lb (71.2 kg)     Physical Exam Neck:     Vascular: No carotid bruit or JVD.  Cardiovascular:     Rate and Rhythm: Normal rate and regular rhythm. Frequent Extrasystoles are present.    Pulses: Intact distal pulses.     Heart sounds: Normal heart sounds. No murmur heard.    No gallop.  Pulmonary:     Effort: Pulmonary effort is normal.     Breath sounds: Normal breath sounds.  Abdominal:     General: Bowel sounds are normal.     Palpations: Abdomen is soft.  Musculoskeletal:     Right lower leg: No edema.     Left lower leg: No edema.     Studies Reviewed: Marland Kitchen    EKG:    EKG Interpretation Date/Time:  Tuesday November 22 2022 16:00:17 EDT Ventricular Rate:  90 PR Interval:  184 QRS Duration:  80 QT Interval:  362 QTC Calculation: 442 R Axis:   -6  Text Interpretation: EKG 11/22/2022: Normal sinus rhythm at rate of 90 bpm, normal axis, no evidence of ischemia.  Normal EKG.  Compared to 10/01/2022, frequent PACs and deep T wave inversion in the inferior leads and anteroseptal leads not present. Confirmed by Delrae Rend [phone]) on 11/22/2022 4:04:38 PM   EKG 10/01/2022: Normal sinus rhythm at rate of 74 bpm with frequent PACs in bigeminal pattern.  Deep T wave inversion in inferior leads and anteroseptal leads, cannot exclude ischemia.  EKG 09/21/2022: EKG 09/21/2022: Normal sinus rhythm with frequent PACs, atrial triplets.  No atrial fibrillation.  Left Heart Catheterization 04/09/2015:    1. No angiographic evidence of CAD 2. Mild LV systolic dysfunction  Lower Extremity Venous Duplex (09/21/2022)  RIGHT:  - Findings consistent with acute deep vein thrombosis involving the right  femoral vein.  - No cystic structure found in the popliteal fossa.    LEFT:  - There is no evidence of deep vein thrombosis in the lower extremity.    - No cystic structure found in the popliteal fossa.   Echocardiogram  09/21/2022:  1. Left ventricular ejection fraction, by estimation, is 50 to 55%. The left ventricle has low normal function. The left ventricle has no regional wall motion abnormalities. There is mild left ventricular hypertrophy. Left ventricular diastolic  parameters are consistent with Grade I diastolic dysfunction (impaired relaxation).  2. Right ventricular systolic function is severely reduced. The right ventricular size is moderately enlarged. There is severely elevated pulmonary artery systolic pressure.  3. Right atrial size was severely dilated.  4. The mitral valve  is normal in structure. Mild mitral valve regurgitation. No evidence of mitral stenosis.  5. The aortic valve is tricuspid. Aortic valve regurgitation is not visualized. No aortic stenosis is present.  6. The inferior vena cava is dilated in size with <50% respiratory variability, suggesting right atrial pressure of 15 mmHg.  ASSESSMENT AND PLAN: .      ICD-10-CM   1. Paroxysmal atrial fibrillation (HCC)  I48.0     2. Acute saddle pulmonary embolism with acute cor pulmonale (HCC)  I26.02 EKG 12-Lead    ECHOCARDIOGRAM COMPLETE    3. Mixed hyperlipidemia  E78.2 atorvastatin (LIPITOR) 20 MG tablet    4. Primary hypertension  I10 ECHOCARDIOGRAM COMPLETE    5. Secondary hypercoagulability disorder (HCC)  D68.69      CHA2DS2-VASc Score = 3  The patient's score is based upon: CHF History: 0 HTN History: 1 Diabetes History: 0 Stroke History: 0 Vascular Disease History: 0 Age Score: 2 Gender Score: 0       ASSESSMENT AND PLAN: Paroxysmal Atrial Fibrillation (ICD10:  I48.0) The patient's CHA2DS2-VASc score is 3, indicating a 3.2% annual risk of stroke.    Secondary Hypercoagulable State (ICD10:  D68.69) The patient is at significant risk for stroke/thromboembolism based upon his CHA2DS2-VASc Score of 3.  Continue Warfarin (Coumadin).    Signed,  Yates Decamp, MD    11/24/2022 4:25 PM     Assessment and Plan     Deep Vein Thrombosis (DVT) and Pulmonary Embolism (PE) History of spontaneous DVT and PE. Currently on Warfarin with concerns about frequent bruising. Discussed the risks/benefits of continuing Warfarin vs switching to Eliquis or Xarelto. -Discontinue Aspirin to reduce bruising. -Explore patient assistance for Eliquis or Xarelto to potentially switch anticoagulation therapy.  Patient has been referred for pharmacy for evaluation of the same.  Hyperlipidemia Not currently on statin therapy. Discussed the benefits of statin therapy in reducing the risk of stroke. -Restart Atorvastatin 20mg  daily.  Nephrolithiasis History of recurrent kidney stones. Recently passed stones. -Advise to bring passed stones to urologist for analysis and dietary recommendations.  Knee Osteoarthritis Reports significant knee pain, likely secondary to osteoarthritis. Plans for imaging and potential surgery. -Continue with planned imaging and follow-up with orthopedic surgeon.  General Health Maintenance -Order echocardiogram to assess cardiac function. -Follow-up as needed.   From cardiac standpoint, he does not need any ischemic workup as he is completely asymptomatic, he has resumed all his physical activity, discussed regarding lifelong anticoagulation needed in view of spontaneous DVT and PE.  I will repeat echocardiogram to reevaluate his RV function otherwise I will see him back on a as needed basis.  This was a 67-minute office visit and evaluation of external records, hospitalization records, making complex decisions.  Indeed if the echocardiogram reveals significant abnormality or severe pulmonary hypertension, I will certainly bring him back to be reevaluated for WHO group 4 PAH.   Signed,  Yates Decamp, MD, [facility] 11/24/2022, 4:25 PM [facility] [address] [place], [place] [address] Phone: [phone]. Fax:  [phone]

## 2022-11-22 NOTE — Patient Instructions (Signed)
Medication Instructions:  Your physician has recommended you make the following change in your medication:   1) START atorvastatin (Lipitor) 20 mg daily  *If you need a refill on your cardiac medications before your next appointment, please call your pharmacy*  Lab Work: None ordered today.  Testing/Procedures: Your physician has requested that you have an echocardiogram. Echocardiography is a painless test that uses sound waves to create images of your heart. It provides your doctor with information about the size and shape of your heart and how well your heart's chambers and valves are working. This procedure takes approximately one hour. There are no restrictions for this procedure. Please do NOT wear cologne, perfume, aftershave, or lotions (deodorant is allowed). Please arrive 15 minutes prior to your appointment time.   Follow-Up: At [facility], you and your health needs are our priority.  As part of our continuing mission to provide you with exceptional heart care, we have created designated Provider Care Teams.  These Care Teams include your primary Cardiologist (physician) and Advanced Practice Providers (APPs -  Physician Assistants and Nurse Practitioners) who all work together to provide you with the care you need, when you need it.  We recommend signing up for the patient portal called "MyChart".  Sign up information is provided on this After Visit Summary.  MyChart is used to connect with patients for Virtual Visits (Telemedicine).  Patients are able to view lab/test results, encounter notes, upcoming appointments, etc.  Non-urgent messages can be sent to your provider as well.   To learn more about what you can do with MyChart, go to [email or web address].    Your next appointment:   As needed  The format for your next appointment:   In Person  Provider:   Yates Decamp, MD {

## 2022-11-23 ENCOUNTER — Telehealth: Payer: Self-pay

## 2022-11-23 ENCOUNTER — Ambulatory Visit: Payer: Medicare Other

## 2022-11-23 DIAGNOSIS — Z7901 Long term (current) use of anticoagulants: Secondary | ICD-10-CM

## 2022-11-23 DIAGNOSIS — Z48817 Encounter for surgical aftercare following surgery on the skin and subcutaneous tissue: Secondary | ICD-10-CM | POA: Diagnosis not present

## 2022-11-23 LAB — POCT INR: INR: 2.8 (ref 2.0–3.0)

## 2022-11-23 NOTE — Progress Notes (Addendum)
Continue  2 tablets daily. Recheck in 1 week.  To assure INR is not trending up above range will check INR in one week. Pt agreed. Advised pt if any s/s of abnormal bruising or bleeding to go to ER or call 911. Pt verbalized understanding.

## 2022-11-23 NOTE — Patient Instructions (Addendum)
Pre visit review using our clinic review tool, if applicable. No additional management support is needed unless otherwise documented below in the visit note.  Continue 2 tablets daily. Recheck in 1 week.

## 2022-11-23 NOTE — Progress Notes (Signed)
11/23/2022  Patient ID: [name], male   DOB: 06/06/1940, 82 y.o.   MRN: [id number]  Patient referred to me by PCP/Cardiologist to evaluate qualification for med assistance for either Xarelto or Eliquis.  Reviewed annual income and lack of prescription coverage with patient. Patient would qualify for Eliquis or Xarelto assistance.  Reviewed medications with patient and he prefers to try Xarelto. Application process has started.  Sherrill Raring, PharmD Clinical Pharmacist [phone]

## 2022-11-23 NOTE — Telephone Encounter (Signed)
Xarelto application completed and submitted to company, pending response.  Sherrill Raring, PharmD Clinical Pharmacist [phone]

## 2022-11-24 DIAGNOSIS — Z48812 Encounter for surgical aftercare following surgery on the circulatory system: Secondary | ICD-10-CM | POA: Diagnosis not present

## 2022-11-24 DIAGNOSIS — A419 Sepsis, unspecified organism: Secondary | ICD-10-CM | POA: Diagnosis not present

## 2022-11-24 DIAGNOSIS — Z483 Aftercare following surgery for neoplasm: Secondary | ICD-10-CM | POA: Diagnosis not present

## 2022-11-24 DIAGNOSIS — J181 Lobar pneumonia, unspecified organism: Secondary | ICD-10-CM | POA: Diagnosis not present

## 2022-11-24 DIAGNOSIS — M1711 Unilateral primary osteoarthritis, right knee: Secondary | ICD-10-CM | POA: Diagnosis not present

## 2022-11-24 DIAGNOSIS — I2699 Other pulmonary embolism without acute cor pulmonale: Secondary | ICD-10-CM | POA: Diagnosis not present

## 2022-11-25 ENCOUNTER — Telehealth: Payer: Self-pay | Admitting: Adult Health

## 2022-11-25 MED ORDER — RIVAROXABAN 20 MG PO TABS: 20.0000 mg | ORAL_TABLET | Freq: Every day | ORAL | 11 refills | Status: DC

## 2022-11-25 NOTE — Telephone Encounter (Signed)
He has been approved for Xarelto patient assistance

## 2022-11-28 NOTE — Telephone Encounter (Signed)
Noted  

## 2022-11-28 NOTE — Telephone Encounter (Signed)
Re:  rivaroxaban rivaroxaban (XARELTO) 20 MG TABS tablet  Pt called to say he went to:  [id number] - [place], [place] - [address] [place] [address] Phone: [phone]  Fax: [phone]     And, they tried to charge him $600 for this Rx.   Pt would like a call back to discuss.

## 2022-11-30 ENCOUNTER — Ambulatory Visit (INDEPENDENT_AMBULATORY_CARE_PROVIDER_SITE_OTHER): Payer: Medicare Other

## 2022-11-30 DIAGNOSIS — Z7901 Long term (current) use of anticoagulants: Secondary | ICD-10-CM | POA: Diagnosis not present

## 2022-11-30 LAB — POCT INR: INR: 2.8 (ref 2.0–3.0)

## 2022-11-30 NOTE — Patient Instructions (Addendum)
Pre visit review using our clinic review tool, if applicable. No additional management support is needed unless otherwise documented below in the visit note.  Continue  2 tablets daily. Recheck in 2 week.

## 2022-11-30 NOTE — Telephone Encounter (Signed)
Pt stated that he is waiting on a call back from nurse to discuss patient assistance for the Xarelto. Marland Kitchen

## 2022-11-30 NOTE — Telephone Encounter (Signed)
Can you help with this please.

## 2022-11-30 NOTE — Progress Notes (Signed)
Continue  2 tablets daily. Recheck in 2 week.

## 2022-12-01 NOTE — Telephone Encounter (Signed)
Contacted [employer] for Xarelto Assistance.  Provided following copay card info to [facility]: BIN: [id number] ID: [id number] Group: [id number] No PCN  [facility], notified patient and he voiced understanding.  Sherrill Raring, PharmD Clinical Pharmacist [phone]

## 2022-12-01 NOTE — Telephone Encounter (Signed)
Pt will need to have INR checked before starting Xarelto.  When INR is below 2.0 pt is to start Xarelto that evening.  Will need pt to come in for INR check on 10/30 and hold warfarin for 2 days prior to that. Sending msg to PCP to advise.

## 2022-12-01 NOTE — Telephone Encounter (Signed)
Contact pt and advised he should be able to pick up Xarelto at the pharmacy for $0. Advised pt to hold warfarin Monday, 10/28, and Tuesday, 11/29, and have INR check on 11/30. Pt agreed and readback instructions. Pt has been scheduled for INR check for 10/30 at 2:30 per pt request. Pt appreciative of all the help he has received from the clinic. Advised if any difficulty getting Xarelto before Sunday, to contact the coumadin clinic because we have to continue warfarin. Pt verbalized understanding and will check today on the prescription.

## 2022-12-07 ENCOUNTER — Ambulatory Visit (INDEPENDENT_AMBULATORY_CARE_PROVIDER_SITE_OTHER): Payer: Medicare Other

## 2022-12-07 DIAGNOSIS — Z7901 Long term (current) use of anticoagulants: Secondary | ICD-10-CM

## 2022-12-07 LAB — POCT INR: INR: 1.6 — AB (ref 2.0–3.0)

## 2022-12-07 NOTE — Telephone Encounter (Signed)
Shirline Frees, NP  to Me     12/07/22  4:34 PM I would have him taper slowly over the next two weeks . Have him skip one day between doses for the first week and then try skipping three days between doses for the second week. If he is feeling ok after the second week he can stop the medication    Left HIPAA compliant VM for pt to call back for instructions.

## 2022-12-07 NOTE — Telephone Encounter (Signed)
Pt c/o continued dizziness and would to stop taking bupropion, 150 mg every day, to see if dizziness improves. Pt reports he has tried taking it at night and he has also tried taking it in the morning. Advised pt if he has any other side effects from stopping the medication to contact PCP office. Pt verbalized understanding.

## 2022-12-07 NOTE — Progress Notes (Signed)
Pt is transitioning to Xarelto and is here to check INR. If INR is lower than 2.0 pt will start Xarelto this evening.  INR is 1.6 today. Pt was advised to stop warfarin and start Xarelto this evening with supper. Pt reports he did pick up Xarelto the other day at the pharmacy and verbalized understanding.  Anticoagulation warfarin episodes have been resolved.   Pt also inquired about bupropion possible side effects. Sending provider separate msg concerning those questions.

## 2022-12-07 NOTE — Patient Instructions (Signed)
Pre visit review using our clinic review tool, if applicable. No additional management support is needed unless otherwise documented below in the visit note. 

## 2022-12-07 NOTE — Telephone Encounter (Signed)
Pt returned call. Advised of PCP tapering dose for bupropion. Advised if any side effects to contact the office. Pt read back instructions and verbalized understanding.

## 2022-12-14 ENCOUNTER — Ambulatory Visit: Payer: Medicare Other

## 2022-12-16 ENCOUNTER — Ambulatory Visit (HOSPITAL_COMMUNITY): Payer: Medicare Other | Attending: Cardiovascular Disease

## 2022-12-16 DIAGNOSIS — I10 Essential (primary) hypertension: Secondary | ICD-10-CM | POA: Diagnosis not present

## 2022-12-16 DIAGNOSIS — I2602 Saddle embolus of pulmonary artery with acute cor pulmonale: Secondary | ICD-10-CM | POA: Insufficient documentation

## 2022-12-16 LAB — ECHOCARDIOGRAM COMPLETE
Area-P 1/2: 2.93 cm2
MV M vel: 6.19 m/s
MV Peak grad: 153 mm[Hg]
S' Lateral: 2.7 cm

## 2022-12-19 DIAGNOSIS — Z48817 Encounter for surgical aftercare following surgery on the skin and subcutaneous tissue: Secondary | ICD-10-CM | POA: Diagnosis not present

## 2022-12-26 ENCOUNTER — Encounter: Payer: Self-pay | Admitting: Physician Assistant

## 2022-12-26 ENCOUNTER — Encounter: Payer: Medicare Other | Admitting: Physician Assistant

## 2022-12-26 ENCOUNTER — Ambulatory Visit (INDEPENDENT_AMBULATORY_CARE_PROVIDER_SITE_OTHER): Payer: Medicare Other | Admitting: Physician Assistant

## 2022-12-26 DIAGNOSIS — M25561 Pain in right knee: Secondary | ICD-10-CM | POA: Insufficient documentation

## 2022-12-26 DIAGNOSIS — M170 Bilateral primary osteoarthritis of knee: Secondary | ICD-10-CM | POA: Diagnosis not present

## 2022-12-26 MED ORDER — METHYLPREDNISOLONE ACETATE 40 MG/ML IJ SUSP
40.0000 mg | INTRAMUSCULAR | Status: AC | PRN
Start: 2022-12-26 — End: 2022-12-26
  Administered 2022-12-26: 40 mg via INTRA_ARTICULAR

## 2022-12-26 MED ORDER — LIDOCAINE HCL 1 % IJ SOLN
3.0000 mL | INTRAMUSCULAR | Status: AC | PRN
Start: 2022-12-26 — End: 2022-12-26
  Administered 2022-12-26: 3 mL

## 2022-12-26 NOTE — Progress Notes (Signed)
Office Visit Note   Patient: [name]           Date of Birth: [birth year]           MRN: [id number] Visit Date: 12/26/2022              Requested by: Shirline Frees, NP [address] [place],  [place] [address] PCP: Shirline Frees, NP   Assessment & Plan: Visit Diagnoses:  1. 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  He has never tried steroid injections.  He recently had an extensive hospitalization for pneumonia and thinks this made his legs "weaker ".  He is currently also on anticoagulants for multiple blood clots.  Discussed all aspects of treatment.  Would like to go forward with a steroid injection today  Follow-Up Instructions: As needed  Orders:  No orders of the defined types were placed in this encounter.  No orders of the defined types were placed in this encounter.     Procedures: Large Joint Inj: bilateral knee on 12/26/2022 1:22 PM Indications: pain and diagnostic evaluation Details: 25 G 1.5 in needle, anteromedial approach  Arthrogram: No  Medications (Right): 3 mL lidocaine 1 %; 40 mg methylPREDNISolone acetate 40 MG/ML Medications (Left): 3 mL lidocaine 1 %; 40 mg methylPREDNISolone acetate 40 MG/ML Outcome: tolerated well, no immediate complications Procedure, treatment alternatives, risks and benefits explained, specific risks discussed. Consent was given by the patient.     Clinical Data: No additional findings.   Subjective: Bilateral knee pain  HPI 82 year old gentleman whose had a long history of bilateral knee pain right greater than left.  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  Denies any specific injuries recently  Review of Systems  All other systems reviewed  and are negative.    Objective: Vital Signs: There were no vitals taken for this visit.  Physical Exam Constitutional:      Appearance: Normal appearance.  Pulmonary:     Effort: Pulmonary effort is normal.  Skin:    General: Skin is warm and dry.  Neurological:     Mental Status: He is alert.  Psychiatric:        Mood and Affect: Mood normal.        Behavior: Behavior normal.    Ortho Exam Bilateral knees no effusion neurovascular intact compartments are soft and nontender he does have a large palpable Baker's cyst in the posterior aspect of his left knee.  Has good flexion extension strength is fair.  Has very little patellar mobility no erythema or sign of cellulitis Specialty Comments:  No specialty comments available.  Imaging: No results found.   PMFS History: Patient Active Problem List   Diagnosis Date Noted  . Pain in right knee 12/26/2022  . Healthcare-associated pneumonia 10/02/2022  . Pulmonary emboli (HCC) 09/21/2022  . Acute hypoxemic respiratory failure (HCC) 09/21/2022  . AKI (acute kidney injury) (HCC) 09/21/2022  . Melanoma of skin (HCC) 08/26/2022  . Basal cell carcinoma 08/26/2022  . 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 Frequent PVCs   . NSVT (nonsustained ventricular tachycardia) (HCC)   . Premature atrial contractions   . Systolic dysfunction   . Essential hypertension  02/03/2015  . Ectopic beat, ventricular 02/03/2015   Past Medical History:  Diagnosis Date  . Arthritis    HANDS AND PT STATES HIS ARMS HURT  . 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 Colonic mass 2001  . Fatigue    x 1 week o as of 02-17-2020  . Fibromyalgia 1990's   . Frequent PVCs   . Generalized body aches    x 1 week as of 02-17-2020  . H/O inguinal hernia    right side  . Hernia, umbilical   . History of kidney stones    HX OF MULTIPLE KIDNEY STONES AND  CURRENTLY HAS BILATERAL  RENAL STONES CAUSING ABDOMINAL AND BACK PAIN  . Hypertension    high blood pressure readings   . NSVT (nonsustained ventricular tachycardia) (HCC)   . Premature atrial contractions   . Wears dentures    full dentures    Family History  Problem Relation Age of Onset  . Colon cancer Father   . Heart disease Father        quad bypass    Past Surgical History:  Procedure Laterality Date  . BRAIN TUMOR EXCISION  age 38  . CARDIAC CATHETERIZATION N/A 04/09/2015   Procedure: Left Heart Cath and Coronary Angiography;  Surgeon: Kathleene Hazel, MD;  Location: [facility] INVASIVE CV LAB;  Service: Cardiovascular;  Laterality: N/A;  . COLON SURGERY  2001   COLON RESECTION FOR GROWTH - NOT CANCER  . CYSTOSCOPY WITH RETROGRADE PYELOGRAM, URETEROSCOPY AND STENT PLACEMENT Left 10/05/2012   Procedure: CYSTOSCOPY WITH RETROGRADE PYELOGRAM, URETEROSCOPY AND STENT PLACEMENT;  Surgeon: Sebastian Ache, MD;  Location: [facility];  Service: Urology;  Laterality: Left;  . CYSTOSCOPY/RETROGRADE/URETEROSCOPY/STONE EXTRACTION WITH BASKET Right 10/05/2012   Procedure: CYSTOSCOPY/RETROGRADE/URETEROSCOPY/STONE EXTRACTION WITH BASKET;  Surgeon: Sebastian Ache, MD;  Location: [facility];  Service: Urology;  Laterality: Right;  . CYSTOSCOPY/RETROGRADE/URETEROSCOPY/STONE EXTRACTION WITH BASKET Bilateral 10/24/2012   Procedure: CYSTOSCOPY/RETROGRADE/URETEROSCOPY/STONE EXTRACTION WITH BASKET;  Surgeon: Sebastian Ache, MD;  Location: [facility];  Service: Urology;  Laterality: Bilateral;  with bilateral stent placement  . HOLMIUM LASER APPLICATION Right 10/05/2012   Procedure: HOLMIUM LASER APPLICATION;  Surgeon: Sebastian Ache, MD;  Location: [facility];  Service: Urology;  Laterality: Right;  . HOLMIUM LASER APPLICATION Bilateral 10/24/2012   Procedure: HOLMIUM LASER APPLICATION;  Surgeon: Sebastian Ache, MD;  Location: [facility];  Service: Urology;  Laterality: Bilateral;  . IR ANGIOGRAM PULMONARY BILATERAL SELECTIVE  09/21/2022  . IR INFUSION  THROMBOL ARTERIAL INITIAL (MS)  09/21/2022  . IR INFUSION THROMBOL ARTERIAL INITIAL (MS)  09/21/2022  . IR IVC FILTER PLMT / S&I /IMG GUID/MOD SED  09/25/2022  . IR THROMB F/U EVAL ART/VEN FINAL DAY (MS)  09/22/2022  . IR US GUIDE VASC ACCESS RIGHT  09/21/2022  . LITHOTRIPSY    . TONSILLECTOMY  age 18's   and adenoids   Social History   Occupational History  . Not on file  Tobacco Use  . Smoking status: Never  . Smokeless tobacco: Never  Vaping Use  . Vaping status: Never Used  Substance and Sexual Activity  . Alcohol use: Yes    Alcohol/week: 7.0 standard drinks of alcohol    Types: 7 Standard drinks or equivalent per week    Comment: 1 drink per day  . Drug use: Not Currently  . Sexual activity: Not on file

## 2022-12-27 ENCOUNTER — Telehealth: Payer: Self-pay

## 2022-12-27 ENCOUNTER — Ambulatory Visit: Payer: Medicare Other | Admitting: Physician Assistant

## 2022-12-27 ENCOUNTER — Telehealth: Payer: Self-pay | Admitting: *Deleted

## 2022-12-27 ENCOUNTER — Encounter: Payer: Self-pay | Admitting: Physician Assistant

## 2022-12-27 VITALS — HR 65

## 2022-12-27 DIAGNOSIS — C439 Malignant melanoma of skin, unspecified: Secondary | ICD-10-CM

## 2022-12-27 NOTE — Telephone Encounter (Signed)
   Pre-operative Risk Assessment    Patient Name: [name]  DOB: 1940/12/04 MRN: [id number]  DATE OF LAST VISIT: 11/22/22 DR. Jacinto Halim DATE OF NEXT VISIT: NONE    Request for Surgical Clearance    Procedure:   NASAL RECONSTRUCTION ; MOH'S PROCEDURE; APPLICATION OF SKIN SUBSTITUTE  Date of Surgery:  Clearance 01/18/23                                 Surgeon:  DR. Foster Simpson Surgeon's Group or Practice Name:  [facility] Phone number:  [phone] Fax number:  [phone]   Type of Clearance Requested:   - Medical  - Pharmacy:  Hold Rivaroxaban (Xarelto)     Type of Anesthesia:  General    Additional requests/questions:    Elpidio Anis   12/27/2022, 5:38 PM

## 2022-12-27 NOTE — Telephone Encounter (Signed)
Faxed request for surgical clearance form to PCP and cardiologist with confirmed receipt.

## 2022-12-27 NOTE — Progress Notes (Signed)
Patient ID: [name], male    DOB: [birth year], 82 y.o.   MRN: [id number]  Chief Complaint  Patient presents with  . Pre-op Exam      ICD-10-CM   1. Melanoma of skin (HCC)  C43.9        History of Present Illness: [name] is a 82 y.o.  male  with a history of skin cancer.  He presents for preoperative evaluation for upcoming procedure, nasal reconstruction following Mohs excision, scheduled for 01/18/2023 with Dr. Ulice Bold.  The patient has not had problems with anesthesia.  Previous surgeries without complication or difficulty.  He states that he already met with his Mohs surgeon, Dr. Daphine Deutscher, who plans to proceed with the Mohs excision while patient is on Xarelto.  She told him that she does not need him to hold the medication.  Will check with Dr. Ulice Bold given that he may require flap reconstruction if it is required on her end.  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  He is taking the Xarelto regularly.  He is ambulatory here in the office, but does endorse bilateral knee discomfort in the context of osteoarthritis.  He denies any family history of thrombosis/blood clots.  He denies any personal history of cancer aside from skin cancer.  Cardiac catheterization by Dr. Jacinto Halim was reassuring and without evidence for CAD.  He denies any significant pulmonary disease.  He reports that he was otherwise healthy aside from skin cancer when he sustained his pulmonary embolism 3 months ago.  He is a tennis pro.  Denies use of nicotine containing products.  Discussed expectations regarding surgery and patient is agreeable.  He understands that tissue rearrangement decisions are often made intraoperatively and may involve flap surgery.  Spoke with Dr. Ulice Bold and she would prefer that patient hold his anticoagulation 2 days prior to surgery and resume 36 hours after surgery.  Will send surgical clearance to patient's  primary care provider.  Summary of Previous Visit: Patient was seen for initial consult by Dr. Ulice Bold 08/26/2022.  At that time, there was plan for Mohs excision.  History of right nasolabial flap in the past.  Discussed reconstruction of the nose versus forehead flap or left nasal labial flap following Mohs excision.  However, his blood pressure at his preoperative encounter 09/01/2022 was 192/119 and that he was experiencing some mild headaches and ear ringing.  Given his symptoms and unusually high blood pressure, recommended that he first be cleared by primary care provider.  He was then admitted 09/21/2022 for marked exertional dyspnea and found to have submassive pulmonary embolism and right ventricular failure.  IVC filter placed 09/25/2022.    Job: Retired Conservation officer, nature.  PMH Significant for: Melanoma, recent hospitalization 09/21/2022 to 10/05/2022 for submassive pulmonary embolism.  He also developed atrial fibrillation while hospitalized.  No CAD on cardiac catheterization.  Right femoral vein DVT.  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  HLD, nephrolithiasis, osteoarthritis.   Past Medical History: Allergies: Allergies  Allergen Reactions  . Other Other (See Comments)    Mangos - caused a severe rash  . Prozac [Fluoxetine Hcl]     Made him feel "loopy"  . Sulfa Antibiotics Hives  . Vicodin [Hydrocodone-Acetaminophen] Itching    Tolerates oxycodone  . Erythromycin Rash    Many years ago an oral antibiotic caused a rash(wife and patient think it was erythromycin but they are not  positive).    Current Medications:  Current Outpatient Medications:  .  atorvastatin (LIPITOR) 20 MG tablet, Take 1 tablet (20 mg total) by mouth daily., Disp: 90 tablet, Rfl: 0 .  buPROPion (WELLBUTRIN XL) 150 MG 24 hr tablet, Take 1 tablet (150 mg total) by mouth daily., Disp: 90 tablet, Rfl: 0 .  carvedilol (COREG) 3.125 MG tablet, Take 1 tablet (3.125 mg  total) by mouth 2 (two) times daily., Disp: 180 tablet, Rfl: 1 .  Misc Natural Products (OSTEO BI-FLEX ADV JOINT SHIELD PO), Take 2 capsules by mouth daily., Disp: , Rfl:  .  Multiple Vitamin (MULTIVITAMIN) tablet, Take 1 tablet by mouth daily., Disp: , Rfl:  .  ondansetron (ZOFRAN-ODT) 4 MG disintegrating tablet, Take 1 tablet (4 mg total) by mouth every 8 (eight) hours as needed for nausea or vomiting., Disp: 20 tablet, Rfl: 2 .  rivaroxaban (XARELTO) 20 MG TABS tablet, Take 1 tablet (20 mg total) by mouth daily with supper., Disp: 30 tablet, Rfl: 11 .  tamsulosin (FLOMAX) 0.4 MG CAPS capsule, Take 1 capsule (0.4 mg total) by mouth daily., Disp: 90 capsule, Rfl: 3 .  warfarin (COUMADIN) 2.5 MG tablet, TAKE 1 TABLET BY MOUTH DAILY EXCEPT TAKE 1 1/2 TABLETS ON MONDAY, WEDNESDAY AND FRIDAY OR AS DIRECTED BY ANTICOAGULATION CLINIC, Disp: 150 tablet, Rfl: 1  Past Medical Problems: Past Medical History:  Diagnosis Date  . Arthritis    HANDS AND PT STATES HIS ARMS HURT  . 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 Colonic mass 2001  . Fatigue    x 1 week o as of 02-17-2020  . Fibromyalgia 1990's   . Frequent PVCs   . Generalized body aches    x 1 week as of 02-17-2020  . H/O inguinal hernia    right side  . Hernia, umbilical   . History of kidney stones    HX OF MULTIPLE KIDNEY STONES AND  CURRENTLY HAS BILATERAL RENAL STONES CAUSING ABDOMINAL AND BACK PAIN  . Hypertension    high blood pressure readings   . NSVT (nonsustained ventricular tachycardia) (HCC)   . Premature atrial contractions   . Wears dentures    full dentures    Past Surgical History: Past Surgical History:  Procedure Laterality Date  . BRAIN TUMOR EXCISION  age 58  . CARDIAC CATHETERIZATION N/A 04/09/2015   Procedure: Left Heart Cath and Coronary Angiography;  Surgeon: Kathleene Hazel, MD;  [facility] INVASIVE CV LAB;  Service: Cardiovascular;  Laterality: N/A;  . COLON SURGERY   2001   COLON RESECTION FOR GROWTH - NOT CANCER  . CYSTOSCOPY WITH RETROGRADE PYELOGRAM, URETEROSCOPY AND STENT PLACEMENT Left 10/05/2012   Procedure: CYSTOSCOPY WITH RETROGRADE PYELOGRAM, URETEROSCOPY AND STENT PLACEMENT;  Surgeon: Sebastian Ache, MD;  Location: [facility];  Service: Urology;  Laterality: Left;  . CYSTOSCOPY/RETROGRADE/URETEROSCOPY/STONE EXTRACTION WITH BASKET Right 10/05/2012   Procedure: CYSTOSCOPY/RETROGRADE/URETEROSCOPY/STONE EXTRACTION WITH BASKET;  Surgeon: Sebastian Ache, MD;  Location: [facility];  Service: Urology;  Laterality: Right;  . CYSTOSCOPY/RETROGRADE/URETEROSCOPY/STONE EXTRACTION WITH BASKET Bilateral 10/24/2012   Procedure: CYSTOSCOPY/RETROGRADE/URETEROSCOPY/STONE EXTRACTION WITH BASKET;  Surgeon: Sebastian Ache, MD;  Location: [facility];  Service: Urology;  Laterality: Bilateral;  with bilateral stent placement  . HOLMIUM LASER APPLICATION Right 10/05/2012   Procedure: HOLMIUM LASER APPLICATION;  Surgeon: Sebastian Ache, MD;  Location: [facility];  Service: Urology;  Laterality: Right;  . HOLMIUM LASER APPLICATION Bilateral 10/24/2012   Procedure: HOLMIUM LASER APPLICATION;  Surgeon:  Sebastian Ache, MD;  Location: [facility];  Service: Urology;  Laterality: Bilateral;  . IR ANGIOGRAM PULMONARY BILATERAL SELECTIVE  09/21/2022  . IR INFUSION THROMBOL ARTERIAL INITIAL (MS)  09/21/2022  . IR INFUSION THROMBOL ARTERIAL INITIAL (MS)  09/21/2022  . IR IVC FILTER PLMT / S&I /IMG GUID/MOD SED  09/25/2022  . IR THROMB F/U EVAL ART/VEN FINAL DAY (MS)  09/22/2022  . IR US GUIDE VASC ACCESS RIGHT  09/21/2022  . LITHOTRIPSY    . TONSILLECTOMY  age 68's   and adenoids    Social History: Social History   Socioeconomic History  . Marital status: Married    Spouse name: Not on file  . Number of children: 1  . Years of education: Not on file  . Highest education level: Not on file  Occupational History  . Not on file  Tobacco Use  . Smoking status: Never  . Smokeless tobacco: Never  Vaping  Use  . Vaping status: Never Used  Substance and Sexual Activity  . Alcohol use: Yes    Alcohol/week: 7.0 standard drinks of alcohol    Types: 7 Standard drinks or equivalent per week    Comment: 1 drink per day  . Drug use: Not Currently  . Sexual activity: Not on file  Other Topics Concern  . Not on file  Social History Narrative   Retired from Engineer, manufacturing    Has one son - lives local    Likes to play Tennis   Social Determinants of Health   Financial Resource Strain: Low Risk  (05/06/2021)   Overall Financial Resource Strain (CARDIA)   . Difficulty of Paying Living Expenses: Not hard at all  Food Insecurity: No Food Insecurity (10/06/2022)   Hunger Vital Sign   . Worried About Programme researcher, broadcasting/film/video in the Last Year: Never true   . Ran Out of Food in the Last Year: Never true  Transportation Needs: No Transportation Needs (10/06/2022)   PRAPARE - Transportation   . Lack of Transportation (Medical): No   . Lack of Transportation (Non-Medical): No  Physical Activity: Insufficiently Active (05/06/2021)   Exercise Vital Sign   . Days of Exercise per Week: 1 day   . Minutes of Exercise per Session: 90 min  Stress: No Stress Concern Present (05/06/2021)   [employer] of Occupational Health - Occupational Stress Questionnaire   . Feeling of Stress : Not at all  Social Connections: Moderately Isolated (05/06/2021)   Social Connection and Isolation Panel [NHANES]   . Frequency of Communication with Friends and Family: More than three times a week   . Frequency of Social Gatherings with Friends and Family: More than three times a week   . Attends Religious Services: Never   . Active Member of Clubs or Organizations: Yes   . Attends Banker Meetings: More than 4 times per year   . Marital Status: Never married  Intimate Partner Violence: Not At Risk (09/22/2022)   Humiliation, Afraid, Rape, and Kick questionnaire   . Fear of Current or Ex-Partner: No   . Emotionally  Abused: No   . Physically Abused: No   . Sexually Abused: No    Family History: Family History  Problem Relation Age of Onset  . Colon cancer Father   . Heart disease Father        quad bypass    Review of Systems: ROS Denies any recent chest pain, difficulty breathing, leg swelling, fevers.  Physical Exam: Vital Signs There  were no vitals taken for this visit.  Physical Exam Constitutional:      General: Not in acute distress.    Appearance: Normal appearance. Not ill-appearing.  HENT:     Head: Normocephalic and atraumatic.  Eyes:     Pupils: Pupils are equal, round. Cardiovascular:     Rate and Rhythm: Normal rate.    Pulses: Normal pulses.  Pulmonary:     Effort: No respiratory distress or increased work of breathing.  Speaks in full sentences. Abdominal:     General: Abdomen is flat. No distension.   Musculoskeletal: Normal range of motion. No lower extremity swelling or edema. No varicosities. *** Skin:    General: Skin is warm and dry.     Findings: No erythema or rash.  Neurological:     Mental Status: Alert and oriented to person, place, and time.  Psychiatric:        Mood and Affect: Mood normal.        Behavior: Behavior normal.    Assessment/Plan: The patient is scheduled for nasal reconstruction after Mohs excision with Dr. Ulice Bold.  Risks, benefits, and alternatives of procedure discussed, questions answered and consent obtained.    Smoking Status: Non-smoker.  Caprini Score: ***; Risk Factors include: ***, BMI *** 25, and length of planned surgery. Recommendation for mechanical *** pharmacological prophylaxis. Encourage early ambulation.   Pictures obtained: ***  Post-op Rx sent to pharmacy: Keflex, oxycodone (#15), Zofran  Patient was provided with the General Surgical Risk consent document and Pain Medication Agreement prior to their appointment.  They had adequate time to read through the risk consent documents and Pain Medication  Agreement. We also discussed them in person together during this preop appointment. All of their questions were answered to their satisfaction.  Recommended calling if they have any further questions.  Risk consent form and Pain Medication Agreement to be scanned into patient's chart.    Electronically signed by: Evelena Leyden, PA-C 12/27/2022 1:27 PM

## 2022-12-28 NOTE — Telephone Encounter (Signed)
   Patient Name: [name]  DOB: 1940/11/17 MRN: [id number]  Primary Cardiologist: Yates Decamp, MD  Procedure:  NASAL RECONSTRUCTION ; MOH'S PROCEDURE; APPLICATION OF SKIN SUBSTITUTE  Date of Surgery: 01/18/23 Surgeon: Dr. Foster Simpson   Chart reviewed as part of pre-operative protocol coverage. Given past medical history and time since last visit, based on ACC/AHA guidelines, [name] is at acceptable risk for the planned procedure without further cardiovascular testing. Per Dr. Jacinto Halim patient is "low risk from cardiac standpoint. Stop Xarelto for 3 days and restart at 3-5 days post procedure. He has completed anticoagulation for 3 months and will be on it lifelong. Echo shows reversal of RV strain (essentially normal echo in Nov 2024)."  I will route this recommendation to the requesting party via Epic fax function and remove from pre-op pool.  Please call with questions.  Rip Harbour, NP 12/28/2022, 11:31 AM

## 2022-12-28 NOTE — Telephone Encounter (Signed)
Received surgical clearance from cardiologist, Dr. Jacinto Halim.  "Stop Xarelto for 3 days and restart 3-5 days post procedure. Low risk from cardiac standpoint. He has completed anticoagulation for 3 mths and will be on it lifelong. Echo shows reversal of RV strain (essentially normal echo in Nov. 2024).

## 2022-12-28 NOTE — Telephone Encounter (Signed)
Stop Xarelto for 3 days and restart at 3-5 days post procedure. Low risk from cardiac standpoint. He has completed anticoagulation for 3 months and will be on it lifelong. Echo shows reversal of RV strain (essentially normal echo in Nov 2024)

## 2022-12-29 MED ORDER — ONDANSETRON 4 MG PO TBDP: 4.0000 mg | ORAL_TABLET | Freq: Three times a day (TID) | ORAL | 0 refills | Status: DC | PRN

## 2022-12-29 MED ORDER — OXYCODONE HCL 5 MG PO TABS: 5.0000 mg | ORAL_TABLET | Freq: Three times a day (TID) | ORAL | 0 refills | Status: AC | PRN

## 2022-12-29 MED ORDER — CEPHALEXIN 500 MG PO CAPS: 500.0000 mg | ORAL_CAPSULE | Freq: Four times a day (QID) | ORAL | 0 refills | Status: AC

## 2023-01-08 DIAGNOSIS — Z23 Encounter for immunization: Secondary | ICD-10-CM | POA: Diagnosis not present

## 2023-01-11 ENCOUNTER — Encounter (HOSPITAL_COMMUNITY): Payer: Self-pay | Admitting: Anesthesiology

## 2023-01-11 NOTE — Progress Notes (Signed)
Chart reviewed by Dr. Tacy Dura. Ok to proceed as scheduled at [facility] 01/18/23.

## 2023-01-13 ENCOUNTER — Encounter (HOSPITAL_BASED_OUTPATIENT_CLINIC_OR_DEPARTMENT_OTHER): Payer: Self-pay | Admitting: Plastic Surgery

## 2023-01-13 ENCOUNTER — Other Ambulatory Visit: Payer: Self-pay

## 2023-01-13 NOTE — Progress Notes (Signed)
   01/13/23 1005  PAT Phone Screen  Is the patient taking a GLP-1 receptor agonist? No  Do You Have Diabetes? No  Do You Have Hypertension? Yes  Have You Ever Been to the ER for Asthma? No  Have You Taken Oral Steroids in the Past 3 Months? No  Do you Take Phenteramine or any Other Diet Drugs? No  Recent  Lab Work, EKG, CXR? Yes  Where was this test performed? 12-23-22 EKG  Do you have a history of heart problems? (S)  No (cards for PE Dr Jacinto Halim)  Any Recent Hospitalizations? (S)  Yes (adm 8-14 thru 10-04-22 with submassive PE, IVC filter placed)  Height 6' (1.829 m)  Weight 75.2 kg  Pat Appointment Scheduled No  Reason for No Appointment Not Needed

## 2023-01-18 ENCOUNTER — Ambulatory Visit (INDEPENDENT_AMBULATORY_CARE_PROVIDER_SITE_OTHER): Payer: Medicare Other | Admitting: Adult Health

## 2023-01-18 ENCOUNTER — Ambulatory Visit (HOSPITAL_BASED_OUTPATIENT_CLINIC_OR_DEPARTMENT_OTHER): Admit: 2023-01-18 | Payer: Medicare Other | Admitting: Plastic Surgery

## 2023-01-18 ENCOUNTER — Encounter: Payer: Self-pay | Admitting: Adult Health

## 2023-01-18 VITALS — BP 110/80 | HR 72 | Temp 97.6°F | Ht 72.0 in | Wt 166.0 lb

## 2023-01-18 DIAGNOSIS — M1990 Unspecified osteoarthritis, unspecified site: Secondary | ICD-10-CM

## 2023-01-18 DIAGNOSIS — D649 Anemia, unspecified: Secondary | ICD-10-CM | POA: Diagnosis not present

## 2023-01-18 DIAGNOSIS — R300 Dysuria: Secondary | ICD-10-CM

## 2023-01-18 DIAGNOSIS — E559 Vitamin D deficiency, unspecified: Secondary | ICD-10-CM | POA: Diagnosis not present

## 2023-01-18 DIAGNOSIS — R5382 Chronic fatigue, unspecified: Secondary | ICD-10-CM

## 2023-01-18 DIAGNOSIS — Z01818 Encounter for other preprocedural examination: Secondary | ICD-10-CM

## 2023-01-18 LAB — UNLABELED: Test Ordered On Req: 3020

## 2023-01-18 SURGERY — MOH'S PROCEDURE
Anesthesia: Choice | Site: Nose

## 2023-01-18 MED ORDER — TRAMADOL HCL 50 MG PO TABS: 50.0000 mg | ORAL_TABLET | Freq: Two times a day (BID) | ORAL | 0 refills | Status: AC | PRN

## 2023-01-18 NOTE — Progress Notes (Signed)
Subjective:    Patient ID: [name], male    DOB: [birth year], 82 y.o.   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  He presents to the office today for a chronic complaint of fatigue. This has been present for multiple years but he feels like the fatigue is getting worse. He reports that he wakes up feeling fatigued and goes to bed feeling fatigued. He could take a nap in the middle of the afternoon each day. He also reports that he wakes up with a headache and the headache usually lasts all day. The morning when he wakes up the fatigue and headaches are the worst. He is unsure if he snores; denies apnea.   He had to cancel his surgical procedure to have BCC removed from his nose   He would also like to be referred to pain management for chronic osteoarthritis of most of his joints. He cannot take NSAIDS due to being on Xarelto. Tylenol does not help with his pain. We did try Cymbalta made his sleepy.He felt Tramadol made him dizzy but he continues to be dizzy.    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 at age 84 years    Colonic mass 2001   Fatigue    x 1 week o as of 02-17-2020   Fibromyalgia 1990's    Frequent PVCs    Generalized body aches    x 1 week as of 02-17-2020   H/O inguinal hernia    right side   Hernia, umbilical    History of kidney stones    HX OF MULTIPLE KIDNEY STONES AND  CURRENTLY HAS BILATERAL RENAL STONES CAUSING ABDOMINAL AND BACK PAIN    Hypertension    high blood pressure readings    NSVT (nonsustained ventricular tachycardia) (HCC)    Premature atrial contractions    Wears dentures    full dentures    Social History   Socioeconomic History   Marital status: Married    Spouse name: Not on file   Number of children: 1   Years of education: Not on file   Highest education level: Not on file  Occupational History   Not on file  Tobacco Use   Smoking status: Never   Smokeless tobacco: Never  Vaping Use   Vaping status: Never Used  Substance and Sexual Activity   Alcohol use: Not Currently    Alcohol/week: 7.0 standard drinks of alcohol    Types: 7 Standard drinks or equivalent per week   Drug use: Not Currently   Sexual activity: Not on file  Other Topics Concern   Not on file  Social History Narrative   Retired from Engineer, manufacturing    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 - lives local    Likes to play Tennis   Social Determinants of Health   Financial Resource Strain: Low Risk  (05/06/2021)   Overall Financial Resource Strain (CARDIA)    Difficulty of Paying Living Expenses: Not hard at all  Food Insecurity: No Food Insecurity (  10/06/2022)   Hunger Vital Sign    Worried About Running Out of Food in the Last Year: Never true    Ran Out of Food in the Last Year: Never true  Transportation Needs: No Transportation Needs (10/06/2022)   PRAPARE - Administrator, Civil Service (Medical): No    Lack of Transportation (Non-Medical): No  Physical Activity: Insufficiently Active (05/06/2021)   Exercise Vital Sign    Days of Exercise per Week: 1 day    Minutes of Exercise per Session: 90 min  Stress: No Stress Concern Present (05/06/2021)   [employer] of Occupational Health - Occupational Stress Questionnaire    Feeling of Stress : Not at all  Social Connections: Moderately Isolated (05/06/2021)   Social Connection and Isolation Panel [NHANES]    Frequency of Communication with Friends and Family: More than three times a  week    Frequency of Social Gatherings with Friends and Family: More than three times a week    Attends Religious Services: Never    Database administrator or Organizations: Yes    Attends Engineer, structural: More than 4 times per year    Marital Status: Never married  Intimate Partner Violence: Not At Risk (09/22/2022)   Humiliation, Afraid, Rape, and Kick questionnaire    Fear of Current or Ex-Partner: No    Emotionally Abused: No    Physically Abused: No    Sexually Abused: No    Past Surgical History:  Procedure Laterality Date   BRAIN TUMOR EXCISION  age 57   CARDIAC CATHETERIZATION N/A 04/09/2015   Procedure: Left Heart Cath and Coronary Angiography;  Surgeon: Kathleene Hazel, MD;  Location: [facility] INVASIVE CV LAB;  Service: Cardiovascular;  Laterality: N/A;   COLON SURGERY  2001   COLON RESECTION FOR GROWTH - NOT CANCER   CYSTOSCOPY WITH RETROGRADE PYELOGRAM, URETEROSCOPY AND STENT PLACEMENT Left 10/05/2012   Procedure: CYSTOSCOPY WITH RETROGRADE PYELOGRAM, URETEROSCOPY AND STENT PLACEMENT;  Surgeon: Sebastian Ache, MD;  Location: [facility];  Service: Urology;  Laterality: Left;   CYSTOSCOPY/RETROGRADE/URETEROSCOPY/STONE EXTRACTION WITH BASKET Right 10/05/2012   Procedure: CYSTOSCOPY/RETROGRADE/URETEROSCOPY/STONE EXTRACTION WITH BASKET;  Surgeon: Sebastian Ache, MD;  Location: [facility];  Service: Urology;  Laterality: Right;   CYSTOSCOPY/RETROGRADE/URETEROSCOPY/STONE EXTRACTION WITH BASKET Bilateral 10/24/2012   Procedure: CYSTOSCOPY/RETROGRADE/URETEROSCOPY/STONE EXTRACTION WITH BASKET;  Surgeon: Sebastian Ache, MD;  Location: [facility];  Service: Urology;  Laterality: Bilateral;  with bilateral stent placement   HOLMIUM LASER APPLICATION Right 10/05/2012   Procedure: HOLMIUM LASER APPLICATION;  Surgeon: Sebastian Ache, MD;  Location: [facility];  Service: Urology;  Laterality: Right;   HOLMIUM LASER APPLICATION Bilateral 10/24/2012   Procedure: HOLMIUM LASER APPLICATION;  Surgeon:  Sebastian Ache, MD;  Location: [facility];  Service: Urology;  Laterality: Bilateral;   IR ANGIOGRAM PULMONARY BILATERAL SELECTIVE  09/21/2022   IR INFUSION THROMBOL ARTERIAL INITIAL (MS)  09/21/2022   IR INFUSION THROMBOL ARTERIAL INITIAL (MS)  09/21/2022   IR IVC FILTER PLMT / S&I /IMG GUID/MOD SED  09/25/2022   IR THROMB F/U EVAL ART/VEN FINAL DAY (MS)  09/22/2022   IR US GUIDE VASC ACCESS RIGHT  09/21/2022   LITHOTRIPSY     TONSILLECTOMY  age 63's   and adenoids    Family History  Problem Relation Age of Onset   Colon cancer Father    Heart disease Father        quad bypass    Allergies  Allergen Reactions   Other Other (See Comments)  Mangos - caused a severe rash   Prozac [Fluoxetine Hcl]     Made him feel "loopy"   Sulfa Antibiotics Hives   Vicodin [Hydrocodone-Acetaminophen] Itching    Tolerates oxycodone   Erythromycin Rash    Many years ago an oral antibiotic caused a rash(wife and patient think it was erythromycin but they are not positive).    Current Outpatient Medications on File Prior to Visit  Medication Sig Dispense Refill   atorvastatin (LIPITOR) 20 MG tablet Take 1 tablet (20 mg total) by mouth daily. 90 tablet 0   carvedilol (COREG) 3.125 MG tablet Take 1 tablet (3.125 mg total) by mouth 2 (two) times daily. 180 tablet 1   Misc Natural Products (OSTEO BI-FLEX ADV JOINT SHIELD PO) Take 2 capsules by mouth daily.     Multiple Vitamin (MULTIVITAMIN) tablet Take 1 tablet by mouth daily.     ondansetron (ZOFRAN-ODT) 4 MG disintegrating tablet Take 1 tablet (4 mg total) by mouth every 8 (eight) hours as needed for nausea or vomiting. 20 tablet 0   rivaroxaban (XARELTO) 20 MG TABS tablet Take 1 tablet (20 mg total) by mouth daily with supper. 30 tablet 11   tamsulosin (FLOMAX) 0.4 MG CAPS capsule Take 1 capsule (0.4 mg total) by mouth daily. 90 capsule 3   triamcinolone ointment (KENALOG) 0.1 % Apply topically daily.     No current facility-administered medications on  file prior to visit.    BP 110/80   Pulse 72   Temp 97.6 F (36.4 C) (Oral)   Ht 6' (1.829 m)   Wt 166 lb (75.3 kg)   SpO2 97%   BMI 22.51 kg/m       Objective:   Physical Exam Vitals and nursing note reviewed.  Constitutional:      Appearance: Normal appearance.  Cardiovascular:     Rate and Rhythm: Normal rate and regular rhythm.     Pulses: Normal pulses.     Heart sounds: Normal heart sounds.  Pulmonary:     Effort: Pulmonary effort is normal.     Breath sounds: Normal breath sounds.  Musculoskeletal:        General: Normal range of motion.  Skin:    General: Skin is warm and dry.  Neurological:     General: No focal deficit present.     Mental Status: He is alert and oriented to person, place, and time.  Psychiatric:        Mood and Affect: Mood normal.        Behavior: Behavior normal.        Thought Content: Thought content normal.        Judgment: Judgment normal.       Assessment & Plan:  1. Chronic fatigue-  - will check labs today for a cause. If labs are normal then will order sleep study  - IBC + Ferritin; Future - CBC; Future - Comprehensive metabolic panel; Future - Urinalysis with Culture Reflex; Future  2. Vitamin D deficiency  - VITAMIN D 25 Hydroxy (Vit-D Deficiency, Fractures); Future  3. Anemia, unspecified type  - IBC + Ferritin; Future - CBC; Future - Vitamin B12; Future  4. Chronic osteoarthritis - Will trial him on Tramadol again.   - Follow up in 2 weeks  - Consider referral to pain management  - traMADol (ULTRAM) 50 MG tablet; Take 1 tablet (50 mg total) by mouth every 12 (twelve) hours as needed for up to 5 days.  Dispense: 30 tablet; Refill:  0   Shirline Frees, NP

## 2023-01-19 ENCOUNTER — Other Ambulatory Visit: Payer: Self-pay | Admitting: Adult Health

## 2023-01-19 DIAGNOSIS — R5382 Chronic fatigue, unspecified: Secondary | ICD-10-CM

## 2023-01-19 LAB — CBC
HCT: 45.4 % (ref 39.0–52.0)
Hemoglobin: 15.2 g/dL (ref 13.0–17.0)
MCHC: 33.6 g/dL (ref 30.0–36.0)
MCV: 95.2 fL (ref 78.0–100.0)
Platelets: 318 10*3/uL (ref 150.0–400.0)
RBC: 4.77 Mil/uL (ref 4.22–5.81)
RDW: 15.5 % (ref 11.5–15.5)
WBC: 6.1 10*3/uL (ref 4.0–10.5)

## 2023-01-19 LAB — IBC + FERRITIN
Ferritin: 42.9 ng/mL (ref 22.0–322.0)
Iron: 121 ug/dL (ref 42–165)
Saturation Ratios: 26.7 % (ref 20.0–50.0)
TIBC: 453.6 ug/dL — ABNORMAL HIGH (ref 250.0–450.0)
Transferrin: 324 mg/dL (ref 212.0–360.0)

## 2023-01-19 LAB — VITAMIN B12: Vitamin B-12: 363 pg/mL (ref 211–911)

## 2023-01-19 LAB — COMPREHENSIVE METABOLIC PANEL
ALT: 16 U/L (ref 0–53)
AST: 23 U/L (ref 0–37)
Albumin: 4.4 g/dL (ref 3.5–5.2)
Alkaline Phosphatase: 104 U/L (ref 39–117)
BUN: 21 mg/dL (ref 6–23)
CO2: 28 meq/L (ref 19–32)
Calcium: 9.5 mg/dL (ref 8.4–10.5)
Chloride: 100 meq/L (ref 96–112)
Creatinine, Ser: 1.1 mg/dL (ref 0.40–1.50)
GFR: 62.99 mL/min (ref >60.00)
Glucose, Bld: 92 mg/dL (ref 70–99)
Potassium: 4.3 meq/L (ref 3.5–5.1)
Sodium: 138 meq/L (ref 135–145)
Total Bilirubin: 0.6 mg/dL (ref 0.2–1.2)
Total Protein: 7.2 g/dL (ref 6.0–8.3)

## 2023-01-19 LAB — VITAMIN D 25 HYDROXY (VIT D DEFICIENCY, FRACTURES): VITD: 45.65 ng/mL (ref 30.00–100.00)

## 2023-01-20 NOTE — Addendum Note (Signed)
Addended by: Waymon Amato R on: 01/20/2023 12:47 PM   Modules accepted: Orders

## 2023-01-21 LAB — PAT ID TIQ DOC: Test Affected: 395

## 2023-01-21 LAB — URINE CULTURE
MICRO NUMBER:: 15848287
Result:: NO GROWTH
SPECIMEN QUALITY:: ADEQUATE

## 2023-01-27 ENCOUNTER — Encounter: Payer: Medicare Other | Admitting: Plastic Surgery

## 2023-01-31 ENCOUNTER — Ambulatory Visit: Payer: Medicare Other | Admitting: Adult Health

## 2023-01-31 VITALS — BP 110/70 | HR 55 | Temp 97.7°F | Ht 72.0 in | Wt 170.0 lb

## 2023-01-31 DIAGNOSIS — M150 Primary generalized (osteo)arthritis: Secondary | ICD-10-CM

## 2023-01-31 MED ORDER — TRAMADOL HCL 50 MG PO TABS: 50.0000 mg | ORAL_TABLET | Freq: Three times a day (TID) | ORAL | 2 refills | Status: DC

## 2023-01-31 NOTE — Progress Notes (Signed)
Subjective:    Patient ID: [name], male    DOB: 09-15-1940, 82 y.o.   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  He presents to the office today for follow-up regarding chronic osteoarthritis.  He cannot take NSAIDs due to being on Xarelto.  Tylenol was not helping with his chronic pain.  In the past we have also tried Cymbalta but this made him sleepy.  In the past he thought tramadol made him feel dizzy but he continues to feel dizzy despite taking this medication.  Placed him back on tramadol 2 weeks ago.  Today he reports that he has been using the Tramadol every 8 hours and this has noticed that his has " helped knock the edge off the pain and I am feeling better".  He has not felt off balance or sleepy while taking the medication.    Review of Systems See HPI   Past Medical History:  Diagnosis Date   Arthritis    HANDS AND PT STATES HIS ARMS HURT   Brain tumor [facility]): Skull Osteoma with brain exention at age 72 years    Colonic mass 2001   Fatigue    x 1 week o as of 02-17-2020   Fibromyalgia 1990's    Frequent PVCs    Generalized body aches    x 1 week as of 02-17-2020   H/O inguinal hernia    right side   Hernia, umbilical    History of kidney stones    HX OF MULTIPLE KIDNEY STONES AND  CURRENTLY HAS BILATERAL RENAL STONES CAUSING ABDOMINAL AND BACK PAIN   Hypertension    high blood pressure readings    NSVT (nonsustained ventricular tachycardia) (HCC)    Premature atrial contractions    Wears dentures    full dentures    Social History   Socioeconomic History   Marital status:  Married    Spouse name: Not on file   Number of children: 1   Years of education: Not on file   Highest education level: Not on file  Occupational History   Not on file  Tobacco Use   Smoking status: Never   Smokeless tobacco: Never  Vaping Use   Vaping status: Never Used  Substance and Sexual Activity   Alcohol use: Not Currently    Alcohol/week: 7.0 standard drinks of alcohol    Types: 7 Standard drinks or equivalent per week   Drug use: Not Currently   Sexual activity: Not on file  Other Topics Concern   Not on file  Social History Narrative   Retired from Engineer, manufacturing    Has one son - lives local    Likes to play Tennis   Social Drivers of Corporate investment banker Strain: Low Risk  (05/06/2021)   Overall Financial Resource Strain (CARDIA)    Difficulty of Paying Living Expenses: Not hard at all  Food Insecurity: No Food Insecurity (10/06/2022)   Hunger Vital Sign    Worried About Running Out of Food in the Last Year: Never true    Ran Out of Food in the Last Year: Never true  Transportation Needs: No Transportation Needs (10/06/2022)  PRAPARE - Administrator, Civil Service (Medical): No    Lack of Transportation (Non-Medical): No  Physical Activity: Insufficiently Active (05/06/2021)   Exercise Vital Sign    Days of Exercise per Week: 1 day    Minutes of Exercise per Session: 90 min  Stress: No Stress Concern Present (05/06/2021)   [employer] of Occupational Health - Occupational Stress Questionnaire    Feeling of Stress : Not at all  Social Connections: Moderately Isolated (05/06/2021)   Social Connection and Isolation Panel [NHANES]    Frequency of Communication with Friends and Family: More than three times a week    Frequency of Social Gatherings with Friends and Family: More than three times a week    Attends Religious Services: Never    Database administrator or Organizations: Yes    Attends Engineer, structural: More than 4 times  per year    Marital Status: Never married  Intimate Partner Violence: Not At Risk (09/22/2022)   Humiliation, Afraid, Rape, and Kick questionnaire    Fear of Current or Ex-Partner: No    Emotionally Abused: No    Physically Abused: No    Sexually Abused: No    Past Surgical History:  Procedure Laterality Date   BRAIN TUMOR EXCISION  age 64   CARDIAC CATHETERIZATION N/A 04/09/2015   Procedure: Left Heart Cath and Coronary Angiography;  Surgeon: Kathleene Hazel, MD;  Location: [facility];  Service: Cardiovascular;  Laterality: N/A;   COLON SURGERY  2001   COLON RESECTION FOR GROWTH - NOT CANCER   CYSTOSCOPY WITH RETROGRADE PYELOGRAM, URETEROSCOPY AND STENT PLACEMENT Left 10/05/2012   Procedure: CYSTOSCOPY WITH RETROGRADE PYELOGRAM, URETEROSCOPY AND STENT PLACEMENT;  Surgeon: Sebastian Ache, MD;  Location: [facility];  Service: Urology;  Laterality: Left;   CYSTOSCOPY/RETROGRADE/URETEROSCOPY/STONE EXTRACTION WITH BASKET Right 10/05/2012   Procedure: CYSTOSCOPY/RETROGRADE/URETEROSCOPY/STONE EXTRACTION WITH BASKET;  Surgeon: Sebastian Ache, MD;  Location: [facility];  Service: Urology;  Laterality: Right;   CYSTOSCOPY/RETROGRADE/URETEROSCOPY/STONE EXTRACTION WITH BASKET Bilateral 10/24/2012   Procedure: CYSTOSCOPY/RETROGRADE/URETEROSCOPY/STONE EXTRACTION WITH BASKET;  Surgeon: Sebastian Ache, MD;  Location: [facility];  Service: Urology;  Laterality: Bilateral;  with bilateral stent placement   HOLMIUM LASER APPLICATION Right 10/05/2012   Procedure: HOLMIUM LASER APPLICATION;  Surgeon: Sebastian Ache, MD;  Location: [facility];  Service: Urology;  Laterality: Right;   HOLMIUM LASER APPLICATION Bilateral 10/24/2012   Procedure: HOLMIUM LASER APPLICATION;  Surgeon: Sebastian Ache, MD;  Location: [facility];  Service: Urology;  Laterality: Bilateral;   IR ANGIOGRAM PULMONARY BILATERAL SELECTIVE  09/21/2022   IR INFUSION THROMBOL ARTERIAL INITIAL (MS)  09/21/2022   IR INFUSION THROMBOL ARTERIAL INITIAL (MS)   09/21/2022   IR IVC FILTER PLMT / S&I Lenise Arena GUID/MOD SED  09/25/2022   IR THROMB F/U EVAL ART/VEN FINAL DAY (MS)  09/22/2022   IR US GUIDE VASC ACCESS RIGHT  09/21/2022   LITHOTRIPSY     TONSILLECTOMY  age 68's   and adenoids    Family History  Problem Relation Age of Onset   Colon cancer Father    Heart disease Father        quad bypass    Allergies  Allergen Reactions   Other Other (See Comments)    Mangos - caused a severe rash   Prozac [Fluoxetine Hcl]     Made him feel "loopy"   Sulfa Antibiotics Hives   Vicodin [Hydrocodone-Acetaminophen] Itching    Tolerates oxycodone   Erythromycin Rash  Many years ago an oral antibiotic caused a rash(wife and patient think it was erythromycin but they are not positive).    Current Outpatient Medications on File Prior to Visit  Medication Sig Dispense Refill   atorvastatin (LIPITOR) 20 MG tablet Take 1 tablet (20 mg total) by mouth daily. 90 tablet 0   carvedilol (COREG) 3.125 MG tablet Take 1 tablet (3.125 mg total) by mouth 2 (two) times daily. 180 tablet 1   Misc Natural Products (OSTEO BI-FLEX ADV JOINT SHIELD PO) Take 2 capsules by mouth daily.     Multiple Vitamin (MULTIVITAMIN) tablet Take 1 tablet by mouth daily.     ondansetron (ZOFRAN-ODT) 4 MG disintegrating tablet Take 1 tablet (4 mg total) by mouth every 8 (eight) hours as needed for nausea or vomiting. 20 tablet 0   rivaroxaban (XARELTO) 20 MG TABS tablet Take 1 tablet (20 mg total) by mouth daily with supper. 30 tablet 11   tamsulosin (FLOMAX) 0.4 MG CAPS capsule Take 1 capsule (0.4 mg total) by mouth daily. 90 capsule 3   traMADol (ULTRAM) 50 MG tablet SMARTSIG:1 Tablet(s) By Mouth Every 12 Hours PRN     triamcinolone ointment (KENALOG) 0.1 % Apply topically daily.     No current facility-administered medications on file prior to visit.    BP 110/70   Pulse (!) 55   Temp 97.7 F (36.5 C) (Oral)   Ht 6' (1.829 m)   Wt 170 lb (77.1 kg)   SpO2 93%   BMI 23.06 kg/m        Objective:   Physical Exam Vitals and nursing note reviewed.  Constitutional:      Appearance: Normal appearance.  Cardiovascular:     Rate and Rhythm: Normal rate and regular rhythm.     Pulses: Normal pulses.     Heart sounds: Normal heart sounds.  Pulmonary:     Effort: Pulmonary effort is normal.     Breath sounds: Normal breath sounds.  Musculoskeletal:        General: Normal range of motion.  Skin:    General: Skin is warm and dry.  Neurological:     General: No focal deficit present.     Mental Status: He is oriented to person, place, and time.  Psychiatric:        Mood and Affect: Mood normal.        Behavior: Behavior normal.        Thought Content: Thought content normal.        Judgment: Judgment normal.        Assessment & Plan:  1. Primary osteoarthritis involving multiple joints (Primary) - Will keep him on Tramadol or pain relief. - Follow up in 3 months  - traMADol (ULTRAM) 50 MG tablet; Take 1 tablet (50 mg total) by mouth every 8 (eight) hours.  Dispense: 90 tablet; Refill: 2  Shirline Frees, NP

## 2023-02-03 ENCOUNTER — Other Ambulatory Visit: Payer: Self-pay | Admitting: Adult Health

## 2023-02-03 DIAGNOSIS — G8929 Other chronic pain: Secondary | ICD-10-CM

## 2023-02-03 NOTE — Telephone Encounter (Signed)
Okay for refill?  

## 2023-02-07 ENCOUNTER — Encounter: Payer: Medicare Other | Admitting: Physician Assistant

## 2023-02-10 ENCOUNTER — Telehealth: Payer: Self-pay | Admitting: *Deleted

## 2023-02-10 NOTE — Telephone Encounter (Signed)
 Copied from CRM [id number]. Topic: Clinical - Prescription Issue >> Feb 09, 2023  3:44 PM [name] wrote: Reason for CRM: [name] went to [facility] in [place] and the Pharmacy is trying to charge him for rivaroxaban  (XARELTO ) 20 MG TABS. He is suppose to receive medication free for 1 year. [facility] needs someone from his doctor's office to contact them about receiving the medication for $0.00 under a program since he is not working.

## 2023-02-14 ENCOUNTER — Other Ambulatory Visit: Payer: Self-pay | Admitting: Adult Health

## 2023-02-14 DIAGNOSIS — N400 Enlarged prostate without lower urinary tract symptoms: Secondary | ICD-10-CM

## 2023-02-14 DIAGNOSIS — M150 Primary generalized (osteo)arthritis: Secondary | ICD-10-CM

## 2023-02-14 NOTE — Telephone Encounter (Signed)
 Xarelto assistance program has changed and patient will receive at home directly from company. Spoke with patient and he confirms he has received his first shipment.  Sherrill Raring, PharmD Clinical Pharmacist [phone]

## 2023-02-14 NOTE — Telephone Encounter (Signed)
 Okay for refill?

## 2023-02-14 NOTE — Telephone Encounter (Signed)
**Note De-identified  [place] Obfuscation** Please advise 

## 2023-04-20 DIAGNOSIS — C44311 Basal cell carcinoma of skin of nose: Secondary | ICD-10-CM | POA: Diagnosis not present

## 2023-04-21 ENCOUNTER — Emergency Department (HOSPITAL_COMMUNITY)
Admission: EM | Admit: 2023-04-21 | Discharge: 2023-04-21 | Disposition: A | Discharge status: Home / Self Care | Attending: Emergency Medicine | Admitting: Emergency Medicine

## 2023-04-21 ENCOUNTER — Emergency Department (HOSPITAL_COMMUNITY)

## 2023-04-21 ENCOUNTER — Other Ambulatory Visit: Payer: Self-pay

## 2023-04-21 DIAGNOSIS — Z79899 Other long term (current) drug therapy: Secondary | ICD-10-CM | POA: Insufficient documentation

## 2023-04-21 DIAGNOSIS — I10 Essential (primary) hypertension: Secondary | ICD-10-CM | POA: Insufficient documentation

## 2023-04-21 DIAGNOSIS — K439 Ventral hernia without obstruction or gangrene: Secondary | ICD-10-CM | POA: Diagnosis not present

## 2023-04-21 DIAGNOSIS — N23 Unspecified renal colic: Secondary | ICD-10-CM | POA: Diagnosis not present

## 2023-04-21 DIAGNOSIS — N202 Calculus of kidney with calculus of ureter: Secondary | ICD-10-CM | POA: Diagnosis not present

## 2023-04-21 DIAGNOSIS — K4090 Unilateral inguinal hernia, without obstruction or gangrene, not specified as recurrent: Secondary | ICD-10-CM | POA: Diagnosis not present

## 2023-04-21 DIAGNOSIS — K5730 Diverticulosis of large intestine without perforation or abscess without bleeding: Secondary | ICD-10-CM | POA: Diagnosis not present

## 2023-04-21 DIAGNOSIS — K449 Diaphragmatic hernia without obstruction or gangrene: Secondary | ICD-10-CM | POA: Diagnosis not present

## 2023-04-21 DIAGNOSIS — R109 Unspecified abdominal pain: Secondary | ICD-10-CM | POA: Diagnosis present

## 2023-04-21 LAB — CBC WITH DIFFERENTIAL/PLATELET
Abs Immature Granulocytes: 0.03 10*3/uL (ref 0.00–0.07)
Basophils Absolute: 0.1 10*3/uL (ref 0.0–0.1)
Basophils Relative: 1 %
Eosinophils Absolute: 0.1 10*3/uL (ref 0.0–0.5)
Eosinophils Relative: 1 %
HCT: 44.3 % (ref 39.0–52.0)
Hemoglobin: 14.7 g/dL (ref 13.0–17.0)
Immature Granulocytes: 0 %
Lymphocytes Relative: 17 %
Lymphs Abs: 1.4 10*3/uL (ref 0.7–4.0)
MCH: 32.7 pg (ref 26.0–34.0)
MCHC: 33.2 g/dL (ref 30.0–36.0)
MCV: 98.4 fL (ref 80.0–100.0)
Monocytes Absolute: 1 10*3/uL (ref 0.1–1.0)
Monocytes Relative: 12 %
Neutro Abs: 5.9 10*3/uL (ref 1.7–7.7)
Neutrophils Relative %: 69 %
Platelets: 303 10*3/uL (ref 150–400)
RBC: 4.5 MIL/uL (ref 4.22–5.81)
RDW: 12.8 % (ref 11.5–15.5)
WBC: 8.4 10*3/uL (ref 4.0–10.5)
nRBC: 0 % (ref 0.0–0.2)

## 2023-04-21 LAB — URINALYSIS, ROUTINE W REFLEX MICROSCOPIC
Bacteria, UA: NONE SEEN
Bilirubin Urine: NEGATIVE
Glucose, UA: NEGATIVE mg/dL
Ketones, ur: NEGATIVE mg/dL
Leukocytes,Ua: NEGATIVE
Nitrite: NEGATIVE
Protein, ur: NEGATIVE mg/dL
Specific Gravity, Urine: 1.005 (ref 1.005–1.030)
pH: 8 (ref 5.0–8.0)

## 2023-04-21 LAB — COMPREHENSIVE METABOLIC PANEL
ALT: 15 U/L (ref 0–44)
AST: 27 U/L (ref 15–41)
Albumin: 3.8 g/dL (ref 3.5–5.0)
Alkaline Phosphatase: 86 U/L (ref 38–126)
Anion gap: 8 (ref 5–15)
BUN: 18 mg/dL (ref 8–23)
CO2: 27 mmol/L (ref 22–32)
Calcium: 9.4 mg/dL (ref 8.9–10.3)
Chloride: 99 mmol/L (ref 98–111)
Creatinine, Ser: 1.03 mg/dL (ref 0.61–1.24)
GFR, Estimated: >60 mL/min (ref >60)
Glucose, Bld: 101 mg/dL — ABNORMAL HIGH (ref 70–99)
Potassium: 4.1 mmol/L (ref 3.5–5.1)
Sodium: 134 mmol/L — ABNORMAL LOW (ref 135–145)
Total Bilirubin: 0.8 mg/dL (ref 0.0–1.2)
Total Protein: 6.7 g/dL (ref 6.5–8.1)

## 2023-04-21 LAB — LIPASE, BLOOD: Lipase: 31 U/L (ref 11–51)

## 2023-04-21 MED ORDER — ONDANSETRON 4 MG PO TBDP
ORAL_TABLET | ORAL | 0 refills | Status: DC
Start: 2023-04-21 — End: 2023-08-22

## 2023-04-21 MED ORDER — OXYCODONE-ACETAMINOPHEN 5-325 MG PO TABS: 1.0000 | ORAL_TABLET | Freq: Four times a day (QID) | ORAL | 0 refills | Status: DC | PRN

## 2023-04-21 MED ORDER — MORPHINE SULFATE (PF) 4 MG/ML IV SOLN
4.0000 mg | Freq: Once | INTRAVENOUS | Status: AC
  Administered 2023-04-21: 4 mg via INTRAVENOUS
  Filled 2023-04-21: qty 1

## 2023-04-21 MED ORDER — KETOROLAC TROMETHAMINE 15 MG/ML IJ SOLN
15.0000 mg | Freq: Once | INTRAMUSCULAR | Status: AC
  Administered 2023-04-21: 15 mg via INTRAVENOUS
  Filled 2023-04-21: qty 1

## 2023-04-21 MED ORDER — ONDANSETRON HCL 4 MG/2ML IJ SOLN
4.0000 mg | Freq: Once | INTRAMUSCULAR | Status: AC
  Administered 2023-04-21: 4 mg via INTRAVENOUS
  Filled 2023-04-21: qty 2

## 2023-04-21 NOTE — ED Provider Notes (Signed)
 [place] EMERGENCY DEPARTMENT AT [facility] Provider Note   CSN: [id number] Arrival date & time: 04/21/23  1610     History  Chief Complaint  Patient presents with   Flank Pain    [name] is a 83 y.o. male history of hypertension, recurrent kidney stones here presenting with left flank pain.  Patient states that she had acute onset of left flank pain since yesterday.  She states that it was sharp and radiated down to the left lower quadrant.  Patient states that she has recurrent kidney stones and required lithotripsy.  She states that this is similar to her previous kidney stones.  She denies any fever.  Denies any dysuria.  Patient had some vomiting as well  The history is provided by the patient.       Home Medications Prior to Admission medications   Medication Sig Start Date End Date Taking? Authorizing Provider  atorvastatin (LIPITOR) 20 MG tablet Take 1 tablet (20 mg total) by mouth daily. 11/22/22   Yates Decamp, MD  carvedilol (COREG) 3.125 MG tablet Take 1 tablet (3.125 mg total) by mouth 2 (two) times daily. 10/08/22   Marinda Elk, MD  Misc Natural Products (OSTEO BI-FLEX ADV JOINT SHIELD PO) Take 2 capsules by mouth daily.    [provider]  Multiple Vitamin (MULTIVITAMIN) tablet Take 1 tablet by mouth daily.    [provider]  ondansetron (ZOFRAN-ODT) 4 MG disintegrating tablet Take 1 tablet (4 mg total) by mouth every 8 (eight) hours as needed for nausea or vomiting. 12/29/22   Lorelee New, PA-C  rivaroxaban (XARELTO) 20 MG TABS tablet Take 1 tablet (20 mg total) by mouth daily with supper. 11/25/22   Nafziger, Kandee Keen, NP  tamsulosin (FLOMAX) 0.4 MG CAPS capsule TAKE 1 CAPSULE BY MOUTH EVERY DAY 02/15/23   Nafziger, Kandee Keen, NP  triamcinolone ointment (KENALOG) 0.1 % Apply topically daily. 11/23/22   [provider]      Allergies    Other, Prozac [fluoxetine hcl], Sulfa antibiotics, Vicodin  [hydrocodone-acetaminophen], and Erythromycin    Review of Systems   Review of Systems  Genitourinary:  Positive for flank pain.  All other systems reviewed and are negative.   Physical Exam Updated Vital Signs BP (!) 146/107 (BP Location: Left Arm)   Pulse 83   Temp 97.9 F (36.6 C) (Oral)   Resp 16   SpO2 96%  Physical Exam Vitals and nursing note reviewed.  Constitutional:      Comments: Uncomfortable  HENT:     Head: Normocephalic.     Nose: Nose normal.     Mouth/Throat:     Mouth: Mucous membranes are moist.  Eyes:     Extraocular Movements: Extraocular movements intact.     Pupils: Pupils are equal, round, and reactive to light.  Cardiovascular:     Rate and Rhythm: Normal rate and regular rhythm.     Pulses: Normal pulses.     Heart sounds: Normal heart sounds.  Pulmonary:     Effort: Pulmonary effort is normal.     Breath sounds: Normal breath sounds.  Abdominal:     General: Abdomen is flat.     Comments: + Mild left CVA tenderness  Musculoskeletal:        General: Normal range of motion.     Cervical back: Normal range of motion and neck supple.  Skin:    General: Skin is warm.     Capillary Refill: Capillary refill  takes less than 2 seconds.  Neurological:     General: No focal deficit present.     Mental Status: He is oriented to person, place, and time.  Psychiatric:        Mood and Affect: Mood normal.        Behavior: Behavior normal.     ED Results / Procedures / Treatments   Labs (all labs ordered are listed, but only abnormal results are displayed) Labs Reviewed  URINALYSIS, ROUTINE W REFLEX MICROSCOPIC - Abnormal; Notable for the following components:      Result Value   Hgb urine dipstick MODERATE (*)    All other components within normal limits  COMPREHENSIVE METABOLIC PANEL - Abnormal; Notable for the following components:   Sodium 134 (*)    Glucose, Bld 101 (*)    All other components within normal limits  CBC WITH  DIFFERENTIAL/PLATELET  LIPASE, BLOOD    EKG None  Radiology No results found.  Procedures Procedures    Medications Ordered in ED Medications  morphine (PF) 4 MG/ML injection 4 mg (4 mg Intravenous Given 04/21/23 1749)  ondansetron (ZOFRAN) injection 4 mg (4 mg Intravenous Given 04/21/23 1749)    ED Course/ Medical Decision Making/ A&P                                 Medical Decision Making [name] is a 83 y.o. male here presenting with left flank pain.  Concern for possible renal colic.  Patient has history of kidney stone in the past.  Plan to get CBC and CMP and UA and CT renal stone.  Will give pain medicine and reassess.  8:56 PM I reviewed patient's labs and independently interpreted the CT scan.  CBC showed normal white count.  Chemistry unremarkable.  UA showed no infection.  CT showed 8 mm stone in the left side with hydronephrosis.  Patient also has some fat-containing hernias that is reducible.  Patient's pain is under control.  He is already on Flomax.  Will prescribe some Percocet for pain and Zofran for nausea.  He has [facility] follow[facility]up with [facility].  I told him to call the office on Monday.  He is on Xarelto but I am concerned that he may need intervention for his kidney stone so told him to hold it.  Problems Addressed: Renal colic on left side: acute illness or injury  Amount and/or Complexity of Data Reviewed Labs: ordered. Decision-making details documented in ED Course. Radiology: ordered and independent interpretation performed. Decision-making details documented in ED Course.  Risk Prescription drug management.    Final Clinical Impression(s) / ED Diagnoses Final diagnoses:  None    Rx / DC Orders ED Discharge Orders     None         Charlynne Pander, MD 04/21/23 [phone]

## 2023-04-21 NOTE — Discharge Instructions (Addendum)
 You have a kidney stone on the left side.  As we discussed it is large size and you may need intervention.  I recommend you hold Xarelto for now until you talk to your urologist on Monday.  Take Tylenol for pain and Percocet for severe pain.  Take Zofran for nausea.  Please continue taking your Flomax as prescribed by your doctor  You need to follow-up with your urologist early next week  Return to ER if you have severe abdominal pain or vomiting or fever

## 2023-04-21 NOTE — ED Triage Notes (Signed)
 Pt c/o left sided flank pain that radiates to abdomen, also one episode of vomiting today. Pt has hx of kidney stones.

## 2023-04-24 ENCOUNTER — Telehealth: Payer: Self-pay | Admitting: *Deleted

## 2023-04-24 ENCOUNTER — Telehealth: Payer: Self-pay | Admitting: Cardiology

## 2023-04-24 ENCOUNTER — Other Ambulatory Visit: Payer: Self-pay | Admitting: Urology

## 2023-04-24 DIAGNOSIS — N202 Calculus of kidney with calculus of ureter: Secondary | ICD-10-CM | POA: Diagnosis not present

## 2023-04-24 DIAGNOSIS — N401 Enlarged prostate with lower urinary tract symptoms: Secondary | ICD-10-CM | POA: Diagnosis not present

## 2023-04-24 DIAGNOSIS — R3912 Poor urinary stream: Secondary | ICD-10-CM | POA: Diagnosis not present

## 2023-04-24 NOTE — Telephone Encounter (Signed)
 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    Procedure:  Bilateral ureteroscopy w/ holmium laser  Date of procedure: 05/02/23   CHA2DS2-VASc Score = 3   This indicates a 3.2% annual risk of stroke. The patient's score is based upon: CHF History: 0 HTN History: 1 Diabetes History: 0 Stroke History: 0 Vascular Disease History: 0 Age Score: 2 Gender Score: 0      CrCl 61 ml/min Platelet count 303  Per office protocol, patient can hold Xarelto for 3 days prior to procedure.    Resume as soon as safely possible  **This guidance is not considered finalized until pre-operative APP has relayed final recommendations.**

## 2023-04-24 NOTE — Telephone Encounter (Signed)
 Pt has been scheduled tele preop appt 04/28/23 due to procedure date and med hold.  Med rec and consent are done.

## 2023-04-24 NOTE — Telephone Encounter (Signed)
 Pt has been scheduled tele preop appt 04/28/23 due to procedure date and med hold.  Med rec and consent are done.      Patient Consent for Virtual Visit        [name] has provided verbal consent on 04/24/2023 for a virtual visit (video or telephone).   CONSENT FOR VIRTUAL VISIT FOR:  [name]  By participating in this virtual visit I agree to the following:  I hereby voluntarily request, consent and authorize [facility] and its employed or contracted physicians, physician assistants, nurse practitioners or other licensed health care professionals (the Practitioner), to provide me with telemedicine health care services (the "Services") as deemed necessary by the treating Practitioner. I acknowledge and consent to receive the Services by the Practitioner via telemedicine. I understand that the telemedicine visit will involve communicating with the Practitioner through live audiovisual communication technology and the disclosure of certain medical information by electronic transmission. I acknowledge that I have been given the opportunity to request an in-person assessment or other available alternative prior to the telemedicine visit and am voluntarily participating in the telemedicine visit.  I understand that I have the right to withhold or withdraw my consent to the use of telemedicine in the course of my care at any time, without affecting my right to future care or treatment, and that the Practitioner or I may terminate the telemedicine visit at any time. I understand that I have the right to inspect all information obtained and/or recorded in the course of the telemedicine visit and may receive copies of available information for a reasonable fee.  I understand that some of the potential risks of receiving the Services via telemedicine include:  Delay or interruption in medical evaluation due to technological equipment failure or disruption; Information  transmitted may not be sufficient (e.g. poor resolution of images) to allow for appropriate medical decision making by the Practitioner; and/or  In rare instances, security protocols could fail, causing a breach of personal health information.  Furthermore, I acknowledge that it is my responsibility to provide information about my medical history, conditions and care that is complete and accurate to the best of my ability. I acknowledge that Practitioner's advice, recommendations, and/or decision may be based on factors not within their control, such as incomplete or inaccurate data provided by me or distortions of diagnostic images or specimens that may result from electronic transmissions. I understand that the practice of medicine is not an exact science and that Practitioner makes no warranties or guarantees regarding treatment outcomes. I acknowledge that a copy of this consent can be made available to me via my patient portal [facility] MyChart), or I can request a printed copy by calling the office of [facility].    I understand that my insurance will be billed for this visit.   I have read or had this consent read to me. I understand the contents of this consent, which adequately explains the benefits and risks of the Services being provided via telemedicine.  I have been provided ample opportunity to ask questions regarding this consent and the Services and have had my questions answered to my satisfaction. I give my informed consent for the services to be provided through the use of telemedicine in my medical care

## 2023-04-24 NOTE — Telephone Encounter (Signed)
   Name: [name]  DOB: 20-Feb-1940  MRN: [id number]  Primary Cardiologist: Yates Decamp, MD   Preoperative team, please contact this patient and set up a phone call appointment for further preoperative risk assessment. Please obtain consent and complete medication review. Thank you for your help.  I confirm that guidance regarding antiplatelet and oral anticoagulation therapy has been completed and, if necessary, noted below.  Per office protocol, patient can hold Xarelto for 3 days prior to procedure.   I also confirmed the patient resides in the state of [place]. As per [facility] Medical Board telemedicine laws, the patient must reside in the state in which the provider is licensed.   Ronney Asters, NP 04/24/2023, 4:53 PM [facility]

## 2023-04-24 NOTE — Telephone Encounter (Signed)
   Pre-operative Risk Assessment    Patient Name: [name]  DOB: 03/28/1940 MRN: [id number]   Date of last office visit: 11/22/22 w/ Dr. Jacinto Halim  Date of next office visit: told to comeback as needed    Request for Surgical Clearance    Procedure:   Bilateral ureteroscopy w/ holmium laser   Date of Surgery:  Clearance 05/02/23                                Surgeon:  Dr. Gae Gallop Group or Practice Name:  [facility]  Phone number:  [phone]  Fax number:  [phone]    Type of Clearance Requested:   - Medical  - Pharmacy:  Hold Rivaroxaban (Xarelto) 3 days prior    Type of Anesthesia:  General    Additional requests/questions:    [name]   04/24/2023, 4:23 PM

## 2023-04-27 DIAGNOSIS — C44391 Other specified malignant neoplasm of skin of nose: Secondary | ICD-10-CM | POA: Diagnosis not present

## 2023-04-28 ENCOUNTER — Ambulatory Visit: Attending: Cardiology

## 2023-04-28 ENCOUNTER — Other Ambulatory Visit: Payer: Self-pay

## 2023-04-28 ENCOUNTER — Encounter (HOSPITAL_COMMUNITY): Payer: Self-pay | Admitting: Urology

## 2023-04-28 DIAGNOSIS — Z01810 Encounter for preprocedural cardiovascular examination: Secondary | ICD-10-CM | POA: Diagnosis not present

## 2023-04-28 NOTE — Progress Notes (Signed)
 Virtual Visit via Telephone Note   Because of [name] co-morbid illnesses, he is at least at moderate risk for complications without adequate follow up.  This format is felt to be most appropriate for this patient at this time.  Due to technical limitations with video connection Web designer), today's appointment will be conducted as an audio only telehealth visit, and [name] verbally agreed to proceed in this manner.   All issues noted in this document were discussed and addressed.  No physical exam could be performed with this format.  Evaluation Performed:  Preoperative cardiovascular risk assessment _____________   Date:  04/28/2023   Patient ID:  [name], DOB 1940-05-13, MRN [id number] Patient Location:  Home Provider location:   Office  Primary Care Provider:  Shirline Frees, NP Primary Cardiologist:  Yates Decamp, MD  Chief Complaint / Patient Profile  83 y.o. y/o male with a h/o hypertension, hypoxemic respiratory failure in 09/2022, found to have submassive pulmonary embolism underwent direct thrombolysis, PACs and PVCs who is pending bilateral ureteroscopy w/ holmium laser with Dr. Jennette Bill and presents today for telephonic preoperative cardiovascular risk assessment. History of Present Illness   [name] is a 83 y.o. male who presents via audio/video conferencing for a telehealth visit today.  Pt was last seen in cardiology clinic on 11/22/22 by Dr. Jacinto Halim.  At that time CHARLS CUSTER was doing well.  The patient is now pending procedure as outlined above. Since his last visit, he has remained stable from a cardiac standpoint. Today he denies chest pain, shortness of breath, lower extremity edema, fatigue, palpitations, melena, hematuria, hemoptysis, diaphoresis, weakness, presyncope, syncope, orthopnea, and PND.  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    Colonic mass 2001   Fatigue    x 1 week o as of 02-17-2020   Fibromyalgia 1990's    Frequent PVCs    Generalized body aches    x 1 week as of 02-17-2020   H/O inguinal hernia    right side   Hernia, umbilical    History of kidney stones    HX OF MULTIPLE KIDNEY STONES AND  CURRENTLY HAS BILATERAL RENAL STONES CAUSING ABDOMINAL AND BACK PAIN   Hypertension    high blood pressure readings    NSVT (nonsustained ventricular tachycardia) (HCC)    Premature atrial contractions    Wears dentures    full dentures   Past Surgical History:  Procedure Laterality Date   BRAIN TUMOR EXCISION  age 12   CARDIAC CATHETERIZATION N/A 04/09/2015   Procedure: Left Heart Cath and Coronary Angiography;  Surgeon: Kathleene Hazel, MD;  Location: [facility] INVASIVE CV LAB;  Service: Cardiovascular;  Laterality: N/A;   COLON SURGERY  2001   COLON RESECTION FOR GROWTH - NOT CANCER   CYSTOSCOPY WITH RETROGRADE PYELOGRAM, URETEROSCOPY AND STENT PLACEMENT Left 10/05/2012   Procedure: CYSTOSCOPY WITH RETROGRADE PYELOGRAM, URETEROSCOPY AND STENT PLACEMENT;  Surgeon: Sebastian Ache, MD;  Location: [facility];  Service: Urology;  Laterality: Left;   CYSTOSCOPY/RETROGRADE/URETEROSCOPY/STONE EXTRACTION WITH BASKET Right 10/05/2012   Procedure: CYSTOSCOPY/RETROGRADE/URETEROSCOPY/STONE EXTRACTION WITH BASKET;  Surgeon: Sebastian Ache, MD;  Location: [facility];  Service: Urology;  Laterality: Right;   CYSTOSCOPY/RETROGRADE/URETEROSCOPY/STONE EXTRACTION WITH BASKET Bilateral 10/24/2012   Procedure: CYSTOSCOPY/RETROGRADE/URETEROSCOPY/STONE EXTRACTION WITH BASKET;  Surgeon: Sebastian Ache,  MD;  Location: [facility];  Service: Urology;  Laterality: Bilateral;  with bilateral stent placement   HOLMIUM LASER APPLICATION Right 10/05/2012   Procedure: HOLMIUM LASER APPLICATION;  Surgeon: Sebastian Ache, MD;  Location: [facility];  Service: Urology;  Laterality: Right;   HOLMIUM LASER  APPLICATION Bilateral 10/24/2012   Procedure: HOLMIUM LASER APPLICATION;  Surgeon: Sebastian Ache, MD;  Location: [facility];  Service: Urology;  Laterality: Bilateral;   IR ANGIOGRAM PULMONARY BILATERAL SELECTIVE  09/21/2022   IR INFUSION THROMBOL ARTERIAL INITIAL (MS)  09/21/2022   IR INFUSION THROMBOL ARTERIAL INITIAL (MS)  09/21/2022   IR IVC FILTER PLMT / S&I Lenise Arena GUID/MOD SED  09/25/2022   IR THROMB F/U EVAL ART/VEN FINAL DAY (MS)  09/22/2022   IR US GUIDE VASC ACCESS RIGHT  09/21/2022   LITHOTRIPSY     TONSILLECTOMY  age 63's   and adenoids   Allergies Allergies  Allergen Reactions   Other Other (See Comments)    Mangos - caused a severe rash   Prozac [Fluoxetine Hcl]     Made him feel "loopy"   Sulfa Antibiotics Hives   Vicodin [Hydrocodone-Acetaminophen] Itching    Tolerates oxycodone   Erythromycin Rash    Many years ago an oral antibiotic caused a rash(wife and patient think it was erythromycin but they are not positive).   Home Medications    Prior to Admission medications   Medication Sig Start Date End Date Taking? Authorizing Provider  carvedilol (COREG) 3.125 MG tablet Take 1 tablet (3.125 mg total) by mouth 2 (two) times daily. 10/08/22   Marinda Elk, MD  Misc Natural Products (OSTEO BI-FLEX ADV JOINT SHIELD PO) Take 2 capsules by mouth daily.    [provider]  Multiple Vitamin (MULTIVITAMIN) tablet Take 1 tablet by mouth daily.    [provider]  ondansetron (ZOFRAN-ODT) 4 MG disintegrating tablet 4mg  ODT q4 hours prn nausea/vomit 04/21/23   Charlynne Pander, MD  oxyCODONE-acetaminophen (PERCOCET) 5-325 MG tablet Take 1-2 tablets by mouth every 6 (six) hours as needed. Patient not taking: Reported on 04/27/2023 04/21/23   Charlynne Pander, MD  rivaroxaban (XARELTO) 20 MG TABS tablet Take 1 tablet (20 mg total) by mouth daily with supper. 11/25/22   Nafziger, Kandee Keen, NP  tamsulosin (FLOMAX) 0.4 MG CAPS capsule TAKE 1 CAPSULE BY MOUTH EVERY DAY  02/15/23   Nafziger, Kandee Keen, NP  traMADol (ULTRAM) 50 MG tablet Take 50 mg by mouth every 8 (eight) hours as needed for severe pain (pain score 7-10).    [provider]    Physical Exam  Vital Signs:  MARSHAWN NORMOYLE does not have vital signs available for review today. Given telephonic nature of communication, physical exam is limited. AAOx3. NAD. Normal affect.  Speech and respirations are unlabored. Accessory Clinical Findings  None Assessment & Plan    1.  Preoperative Cardiovascular Risk Assessment: Bilateral ureteroscopy w/ holmium laser with Dr. Jennette Bill  Mr. Niess's perioperative risk of a major cardiac event is 0.4% according to the Revised Cardiac Risk Index (RCRI).  Therefore, he is at low risk for perioperative complications.   His functional capacity is good at 7.34 METs according to the Duke Activity Status Index (DASI). Recommendations: According to ACC/AHA guidelines, no further cardiovascular testing needed.  The patient may proceed to surgery at acceptable risk.   Antiplatelet and/or Anticoagulation Recommendations: Xarelto (Rivaroxaban) can be held for 3 days prior to surgery.  Please resume post op when felt to be safe.  The patient was advised that if he develops new symptoms prior to surgery to contact our office to arrange for a follow-up visit, and he verbalized understanding.  A copy of this note will be routed to requesting surgeon.  Time:   Today, I have spent 7 minutes with the patient with telehealth technology discussing medical history, symptoms, and management plan.    Rip Harbour, NP  04/28/2023, 3:27 PM

## 2023-04-28 NOTE — Progress Notes (Addendum)
 PCP - Shirline Frees, NP Cardiologist - Yates Decamp, MD LOV 11-22-22  preop tele visit 04-28-23 epic  clearance  PPM/ICD -  Device Orders -  Rep Notified -   Chest x-ray - 10-01-22 epic EKG - 11-22-22 epic Stress Test -  ECHO -  Cardiac Cath -   Sleep Study -  CPAP -   Fasting Blood Sugar -  Checks Blood Sugar _____ times a day  Blood Thinner Instructions: Xarelto  3 days Aspirin Instructions:  ERAS Protcol - PRE-SURGERYn/a   COVID vaccine -yes  Activity--Able to climb a flight of stairs without CP or SOB Anesthesia review: HTN, PE, PAC,PVC, Brain tumor 27 yr. Old,  Patient denies shortness of breath, fever, cough and chest pain at PAT appointment   All instructions explained to the patient, with a verbal understanding of the material. Patient agrees to go over the instructions while at home for a better understanding. Patient also instructed to self quarantine after being tested for COVID-19. The opportunity to ask questions was provided.

## 2023-04-30 NOTE — H&P (Signed)
 83 year old male with a history of recurrent nephrolithiasis last seen in 2017. He underwent staged bilateral ureteroscopy in 2014 with metabolic stone workup was negative for any overt metabolic abnormalities. He was counseled on hydration and dietary citrate. Patient also has a history of BPH he started on finasteride and tamsulosin had 1 episode of urinary retention after surgery.   PMH: Anxiety, arthritis, Afib, PE, HTN, Pt on Xarelto, Cardiologist (   Nephrolithiasis:  04/24/2023: Patient with 8 mm left distal ureteral stone and hydronephrosis, at ER Cr 1.03, WBC 8.4, VS WNL today. No GH, some discomfort, No F/C/N/V.  Patient has passed many stones in the past. Pt has had ESWL does not want that.  05/02/23: here today fro URS  BPH LUTS:  04/24/2023: still on tamsulosin, not on finateride, very large prostate on CT.     ALLERGIES: Azithromycin TABS Sulfa Drugs    MEDICATIONS: Carvedilol  Flomax 0.4 mg capsule  Tramadol Hcl 50 mg tablet  Xarelto     Notes: Pt taking several medications, but don't know names.   GU PSH: Cysto Uretero Lithotripsy - 2014, 2014 Cystoscopy Insert Stent - 2014, 2014 ESWL - 2009 Ureteroscopic stone removal - 2014       PSH Notes: Cystoscopy With Ureteroscopy With Removal Of Calculus, Cystoscopy With Ureteroscopy With Lithotripsy, Cystoscopy With Insertion Of Ureteral Stent Bilateral, Cystoscopy With Insertion Of Ureteral Stent Bilateral, Cystoscopy With Ureteroscopy With Lithotripsy, Craniotomy Tumor Removal - Complete, Lithotripsy, Colon Surgery   NON-GU PSH: Remove Skull Lesion - 2014     GU PMH: Weak Urinary Stream (Stable, Chronic) - 2017 BPH w/LUTS, Benign prostatic hypertrophy with urinary retention - 2016 Renal calculus, Nephrolithiasis - 2016 History of urolithiasis, Nephrolithiasis - 2014 Urinary Tract Inf, Unspec site, Urinary tract infection - 2014      PMH Notes:  2007-05-21 14:38:42 - Note: Urinary Calculus On The Left   2012-08-31 16:16:45 - Note: Arthritis   NON-GU PMH: Hypertension, Hypertension - 2016 Encounter for general adult medical examination without abnormal findings, Encounter for preventive health examination - 2014    FAMILY HISTORY: Colon Cancer - Runs In Family Family Health Status Number - Runs In Family Father Deceased At Age63 ___ - Father Heart Disease - Father   SOCIAL HISTORY: Marital Status: Single Preferred Language: English; Ethnicity: Not Hispanic Or Latino; Race: White Current Smoking Status: Patient has never smoked.  Social Drinker.  Drinks 3 caffeinated drinks per day.     Notes: Never A Smoker, Caffeine Use, Occupation: Retired, Alcohol Use, Marital History - Single   REVIEW OF SYSTEMS:    GU Review Male:   Patient denies frequent urination, hard to postpone urination, burning/ pain with urination, get up at night to urinate, leakage of urine, stream starts and stops, trouble starting your stream, have to strain to urinate , erection problems, and penile pain.  Gastrointestinal (Upper):   Patient denies nausea, vomiting, and indigestion/ heartburn.  Gastrointestinal (Lower):   Patient denies diarrhea and constipation.  Constitutional:   Patient denies fever, night sweats, weight loss, and fatigue.  Skin:   Patient denies skin rash/ lesion and itching.  Eyes:   Patient denies blurred vision and double vision.  Ears/ Nose/ Throat:   Patient denies sore throat and sinus problems.  Hematologic/Lymphatic:   Patient denies swollen glands and easy bruising.  Cardiovascular:   Patient denies leg swelling and chest pains.  Respiratory:   Patient denies cough and shortness of breath.  Endocrine:   Patient denies excessive thirst.  Musculoskeletal:   Patient denies back pain and joint pain.  Neurological:   Patient denies headaches and dizziness.  Psychologic:   Patient denies depression and anxiety.   VITAL SIGNS:      04/24/2023 02:39 PM  BP 140/95 mmHg  Pulse 89 /min   Temperature 97.7 F / 36.5 C   MULTI-SYSTEM PHYSICAL EXAMINATION:    Constitutional: Well-nourished. No physical deformities. Normally developed. Good grooming.  Respiratory: No labored breathing, no use of accessory muscles.   Cardiovascular: Normal temperature, normal extremity pulses, no swelling, no varicosities.  Neurologic / Psychiatric: Oriented to time, oriented to place, oriented to person. No depression, no anxiety, no agitation.  Musculoskeletal: Normal gait and station of head and neck.     Complexity of Data:  Source Of History:  Patient  Urine Test Review:   Urinalysis  X-Ray Review: C.T. Abdomen/Pelvis: Reviewed Films. Reviewed Report. many right renal pelvis stones, left distal ureteral stone 1cm. W/ L sided hydrpnephrosis. Very large prostate    04/17/08  PSA  Total PSA 0.81     PROCEDURES:          Visit Complexity - G2211          Urinalysis w/Scope Dipstick Dipstick Cont'd Micro  Color: Yellow Bilirubin: Neg mg/dL WBC/hpf: 0 - 5/hpf  Appearance: Clear Ketones: Neg mg/dL RBC/hpf: 3 - 11/BJY  Specific Gravity: 1.010 Blood: 2+ ery/uL Bacteria: Rare (0-9/hpf)  pH: 7.5 Protein: Trace mg/dL Cystals: NS (Not Seen)  Glucose: Neg mg/dL Urobilinogen: 0.2 mg/dL Casts: NS (Not Seen)    Nitrites: Neg Trichomonas: Not Present    Leukocyte Esterase: Trace leu/uL Mucous: Not Present      Epithelial Cells: NS (Not Seen)      Yeast: NS (Not Seen)      Sperm: Not Present    ASSESSMENT:      ICD-10 Details  1 GU:   BPH w/LUTS - N40.1 Undiagnosed New Problem  2   Ureteral calculus - N20.1 Undiagnosed New Problem  3   Renal calculus - N20.0 Undiagnosed New Problem   PLAN:            Medications New Meds: Finasteride 5 mg tablet 1 tablet PO Daily   #30  11 Refill(s)  Pharmacy Name:  [facility] [phone]  Address:  [address]   [place], [place] [address]  Phone:  [phone]  Fax:  [phone]            Orders Labs Urine Culture           Document Letter(s):  Created for Patient: Clinical Summary         Notes:   Left distal ureteral stone and right renal pelvis stones: Patient is stable no current symptoms CT shows significant hydronephrosis. Marland Kitchenthe patient is a good candidate for observation due to age as well as stone size. Offered ESWL patient is not interested in this due to prior experience prefer to do ureteroscopy.   Patient does have cardiologist Jake allergy will request cardiac clearance from him urine culture ordered today.   We discussed the risk benefits and alternatives to ureteroscopy. This includes bleeding, infection, damage to surrounding structures including the urethra, bladder, ureter, and kidney. With these possible injuries resulting in need for intervention in the future. We discussed inability to remove all the stone and requiring follow-up ureteroscopy. We also discussed the possibility of not being able to gain access to the kidney and the need for nephrostomy tube. Possibility of  long-term stent was also discussed. The patient voiced their understanding and would like to proceed.    Plan to proceed with URS today

## 2023-05-01 NOTE — Progress Notes (Signed)
 Anesthesia Chart Review   Case: [id number] Date/Time: 05/02/23 1445   Procedure: CYSTOSCOPY/URETEROSCOPY/HOLMIUM LASER/STENT PLACEMENT (Bilateral)   Anesthesia type: General   Diagnosis:      Ureteral calculus [N20.1]     Calculus, renal [N20.0]   Pre-op diagnosis: LEFT URETERAL STONE AND RIGHT RENAL STONE   Location[facility]   Surgeons: Adonis Brook, MD       DISCUSSION:82 y.o. never smoker with h/o HTN, submassive pulmonary embolism underwent direct thrombolysis 09/2022, left ureteral stone, right renal stone scheduled for above procedure 05/02/2023 with Dr. Vilma Prader.   Per cardiology preoperative evaluation 04/28/2023, "Preoperative Cardiovascular Risk Assessment: Bilateral ureteroscopy w/ holmium laser with Dr. Jennette Bill   Mr. Alderfer's perioperative risk of a major cardiac event is 0.4% according to the Revised Cardiac Risk Index (RCRI).  Therefore, he is at low risk for perioperative complications.   His functional capacity is good at 7.34 METs according to the Duke Activity Status Index (DASI). Recommendations: According to ACC/AHA guidelines, no further cardiovascular testing needed.  The patient may proceed to surgery at acceptable risk.   Antiplatelet and/or Anticoagulation Recommendations: Xarelto (Rivaroxaban) can be held for 3 days prior to surgery.  Please resume post op when felt to be safe."  VS: Ht 6' (1.829 m)   Wt 80.7 kg   BMI 24.14 kg/m   PROVIDERS: Shirline Frees, NP is PCP   Primary Cardiologist:  Yates Decamp, MD  LABS: Labs reviewed: Acceptable for surgery. (all labs ordered are listed, but only abnormal results are displayed)  Labs Reviewed - No data to display   IMAGES:   EKG:   CV: Echo 12/16/2022 1. Left ventricular ejection fraction, by estimation, is 60 to 65%. The  left ventricle has normal function. The left ventricle has no regional  wall motion abnormalities. Left ventricular diastolic parameters were  normal.    2. Right ventricular systolic function is normal. The right ventricular  size is normal.   3. Left atrial size was moderately dilated.   4. The mitral valve is abnormal. Mild mitral valve regurgitation. No  evidence of mitral stenosis. Moderate mitral annular calcification.   5. The aortic valve is tricuspid. There is mild calcification of the  aortic valve. Aortic valve regurgitation is mild. Aortic valve sclerosis  is present, with no evidence of aortic valve stenosis.   6. Aortic dilatation noted. 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   7. The inferior vena cava is normal in size with greater than 50%  respiratory variability, suggesting right atrial pressure of 3 mmHg.  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   Colonic mass 2001   Fatigue    x 1 week o as of 02-17-2020   Fibromyalgia 1990's    Frequent PVCs    Generalized body aches    x 1 week as of 02-17-2020   H/O inguinal hernia    right side   Hernia, umbilical    History of kidney stones    HX OF MULTIPLE KIDNEY STONES AND  CURRENTLY HAS BILATERAL RENAL STONES CAUSING ABDOMINAL AND BACK PAIN   Hypertension    high blood  pressure readings    NSVT (nonsustained ventricular tachycardia) (HCC)    Pneumonia    Premature atrial contractions    Wears dentures    full dentures    Past Surgical History:  Procedure Laterality Date   BRAIN TUMOR EXCISION  age 44   CARDIAC CATHETERIZATION N/A 04/09/2015   Procedure: Left Heart Cath and Coronary Angiography;  Surgeon: Kathleene Hazel, MD;  Location: [facility] INVASIVE CV LAB;  Service: Cardiovascular;  Laterality: N/A;   COLON SURGERY  2001   COLON RESECTION FOR GROWTH - NOT CANCER   CYSTOSCOPY WITH  RETROGRADE PYELOGRAM, URETEROSCOPY AND STENT PLACEMENT Left 10/05/2012   Procedure: CYSTOSCOPY WITH RETROGRADE PYELOGRAM, URETEROSCOPY AND STENT PLACEMENT;  Surgeon: Sebastian Ache, MD;  Location: [facility];  Service: Urology;  Laterality: Left;   CYSTOSCOPY/RETROGRADE/URETEROSCOPY/STONE EXTRACTION WITH BASKET Right 10/05/2012   Procedure: CYSTOSCOPY/RETROGRADE/URETEROSCOPY/STONE EXTRACTION WITH BASKET;  Surgeon: Sebastian Ache, MD;  Location: [facility];  Service: Urology;  Laterality: Right;   CYSTOSCOPY/RETROGRADE/URETEROSCOPY/STONE EXTRACTION WITH BASKET Bilateral 10/24/2012   Procedure: CYSTOSCOPY/RETROGRADE/URETEROSCOPY/STONE EXTRACTION WITH BASKET;  Surgeon: Sebastian Ache, MD;  Location: [facility];  Service: Urology;  Laterality: Bilateral;  with bilateral stent placement   HOLMIUM LASER APPLICATION Right 10/05/2012   Procedure: HOLMIUM LASER APPLICATION;  Surgeon: Sebastian Ache, MD;  Location: [facility];  Service: Urology;  Laterality: Right;   HOLMIUM LASER APPLICATION Bilateral 10/24/2012   Procedure: HOLMIUM LASER APPLICATION;  Surgeon: Sebastian Ache, MD;  Location: [facility];  Service: Urology;  Laterality: Bilateral;   IR ANGIOGRAM PULMONARY BILATERAL SELECTIVE  09/21/2022   IR INFUSION THROMBOL ARTERIAL INITIAL (MS)  09/21/2022   IR INFUSION THROMBOL ARTERIAL INITIAL (MS)  09/21/2022   IR IVC FILTER PLMT / S&I /IMG GUID/MOD SED  09/25/2022   IR THROMB F/U EVAL ART/VEN FINAL DAY (MS)  09/22/2022   IR US GUIDE VASC ACCESS RIGHT  09/21/2022   LITHOTRIPSY     TONSILLECTOMY  age 56's   and adenoids    MEDICATIONS: No current facility-administered medications for this encounter.    carvedilol (COREG) 3.125 MG tablet   Multiple Vitamin (MULTIVITAMIN) tablet   ondansetron (ZOFRAN-ODT) 4 MG disintegrating tablet   rivaroxaban (XARELTO) 20 MG TABS tablet   tamsulosin (FLOMAX) 0.4 MG CAPS capsule   traMADol (ULTRAM) 50 MG tablet   Misc Natural Products (OSTEO BI-FLEX ADV JOINT SHIELD PO)    oxyCODONE-acetaminophen (PERCOCET) 5-325 MG tablet   Jodell Cipro Ward, PA-C [facility] [phone]

## 2023-05-02 ENCOUNTER — Ambulatory Visit (HOSPITAL_COMMUNITY)
Admission: RE | Admit: 2023-05-02 | Discharge: 2023-05-02 | Disposition: A | Discharge status: Home / Self Care | Source: Ambulatory Visit | Attending: Urology | Admitting: Urology

## 2023-05-02 ENCOUNTER — Encounter (HOSPITAL_COMMUNITY)
Admission: RE | Disposition: A | Discharge status: Home / Self Care | Payer: Self-pay | Source: Ambulatory Visit | Attending: Urology

## 2023-05-02 ENCOUNTER — Ambulatory Visit (HOSPITAL_BASED_OUTPATIENT_CLINIC_OR_DEPARTMENT_OTHER): Payer: Self-pay | Admitting: Physician Assistant

## 2023-05-02 ENCOUNTER — Ambulatory Visit (HOSPITAL_COMMUNITY): Payer: Self-pay | Admitting: Physician Assistant

## 2023-05-02 ENCOUNTER — Ambulatory Visit (HOSPITAL_COMMUNITY)

## 2023-05-02 ENCOUNTER — Encounter (HOSPITAL_COMMUNITY): Payer: Self-pay | Admitting: Urology

## 2023-05-02 DIAGNOSIS — N400 Enlarged prostate without lower urinary tract symptoms: Secondary | ICD-10-CM | POA: Insufficient documentation

## 2023-05-02 DIAGNOSIS — Z7901 Long term (current) use of anticoagulants: Secondary | ICD-10-CM | POA: Insufficient documentation

## 2023-05-02 DIAGNOSIS — I472 Ventricular tachycardia, unspecified: Secondary | ICD-10-CM | POA: Diagnosis not present

## 2023-05-02 DIAGNOSIS — I10 Essential (primary) hypertension: Secondary | ICD-10-CM | POA: Diagnosis not present

## 2023-05-02 DIAGNOSIS — I4891 Unspecified atrial fibrillation: Secondary | ICD-10-CM | POA: Diagnosis not present

## 2023-05-02 DIAGNOSIS — M797 Fibromyalgia: Secondary | ICD-10-CM | POA: Diagnosis not present

## 2023-05-02 DIAGNOSIS — N202 Calculus of kidney with calculus of ureter: Secondary | ICD-10-CM

## 2023-05-02 DIAGNOSIS — M1990 Unspecified osteoarthritis, unspecified site: Secondary | ICD-10-CM | POA: Insufficient documentation

## 2023-05-02 DIAGNOSIS — I493 Ventricular premature depolarization: Secondary | ICD-10-CM | POA: Diagnosis not present

## 2023-05-02 DIAGNOSIS — I358 Other nonrheumatic aortic valve disorders: Secondary | ICD-10-CM | POA: Diagnosis not present

## 2023-05-02 DIAGNOSIS — Z79899 Other long term (current) drug therapy: Secondary | ICD-10-CM | POA: Insufficient documentation

## 2023-05-02 DIAGNOSIS — R011 Cardiac murmur, unspecified: Secondary | ICD-10-CM | POA: Insufficient documentation

## 2023-05-02 DIAGNOSIS — F419 Anxiety disorder, unspecified: Secondary | ICD-10-CM

## 2023-05-02 DIAGNOSIS — I080 Rheumatic disorders of both mitral and aortic valves: Secondary | ICD-10-CM | POA: Insufficient documentation

## 2023-05-02 DIAGNOSIS — Z8249 Family history of ischemic heart disease and other diseases of the circulatory system: Secondary | ICD-10-CM | POA: Diagnosis not present

## 2023-05-02 DIAGNOSIS — I77819 Aortic ectasia, unspecified site: Secondary | ICD-10-CM | POA: Diagnosis not present

## 2023-05-02 DIAGNOSIS — N201 Calculus of ureter: Secondary | ICD-10-CM | POA: Insufficient documentation

## 2023-05-02 HISTORY — PX: CYSTOSCOPY/URETEROSCOPY/HOLMIUM LASER/STENT PLACEMENT: SHX6546

## 2023-05-02 HISTORY — DX: Anxiety disorder, unspecified: F41.9

## 2023-05-02 HISTORY — DX: Pneumonia, unspecified organism: J18.9

## 2023-05-02 HISTORY — DX: Malignant (primary) neoplasm, unspecified: C80.1

## 2023-05-02 SURGERY — CYSTOSCOPY/URETEROSCOPY/HOLMIUM LASER/STENT PLACEMENT
Anesthesia: General | Laterality: Left

## 2023-05-02 MED ORDER — PHENYLEPHRINE 80 MCG/ML (10ML) SYRINGE FOR IV PUSH (FOR BLOOD PRESSURE SUPPORT)
PREFILLED_SYRINGE | INTRAVENOUS | Status: DC | PRN
  Administered 2023-05-02 (×2): 160 ug via INTRAVENOUS
  Administered 2023-05-02: 80 ug via INTRAVENOUS
  Administered 2023-05-02: 160 ug via INTRAVENOUS

## 2023-05-02 MED ORDER — CHLORHEXIDINE GLUCONATE 0.12 % MT SOLN
15.0000 mL | Freq: Once | OROMUCOSAL | Status: AC
  Administered 2023-05-02: 15 mL via OROMUCOSAL

## 2023-05-02 MED ORDER — PHENAZOPYRIDINE HCL 200 MG PO TABS: 200.0000 mg | ORAL_TABLET | Freq: Three times a day (TID) | ORAL | 0 refills | Status: DC | PRN

## 2023-05-02 MED ORDER — PHENYLEPHRINE 80 MCG/ML (10ML) SYRINGE FOR IV PUSH (FOR BLOOD PRESSURE SUPPORT)
PREFILLED_SYRINGE | INTRAVENOUS | Status: AC
Start: 2023-05-02 — End: ?
  Filled 2023-05-02: qty 10

## 2023-05-02 MED ORDER — PROPOFOL 10 MG/ML IV BOLUS
INTRAVENOUS | Status: DC | PRN
Start: 2023-05-02 — End: 2023-05-02
  Administered 2023-05-02: 30 mg via INTRAVENOUS
  Administered 2023-05-02: 20 mg via INTRAVENOUS
  Administered 2023-05-02: 80 mg via INTRAVENOUS

## 2023-05-02 MED ORDER — DEXAMETHASONE SODIUM PHOSPHATE 10 MG/ML IJ SOLN
INTRAMUSCULAR | Status: AC
Start: 2023-05-02 — End: ?
  Filled 2023-05-02: qty 1

## 2023-05-02 MED ORDER — LACTATED RINGERS IV SOLN: INTRAVENOUS | Status: DC

## 2023-05-02 MED ORDER — ACETAMINOPHEN 500 MG PO TABS
1000.0000 mg | ORAL_TABLET | Freq: Once | ORAL | Status: AC
  Administered 2023-05-02: 1000 mg via ORAL

## 2023-05-02 MED ORDER — ORAL CARE MOUTH RINSE: 15.0000 mL | Freq: Once | OROMUCOSAL | Status: AC

## 2023-05-02 MED ORDER — AMISULPRIDE (ANTIEMETIC) 5 MG/2ML IV SOLN: 10.0000 mg | Freq: Once | INTRAVENOUS | Status: DC | PRN

## 2023-05-02 MED ORDER — OXYCODONE HCL 5 MG/5ML PO SOLN: 5.0000 mg | Freq: Once | ORAL | Status: DC | PRN

## 2023-05-02 MED ORDER — FENTANYL CITRATE (PF) 100 MCG/2ML IJ SOLN
INTRAMUSCULAR | Status: DC | PRN
  Administered 2023-05-02 (×2): 25 ug via INTRAVENOUS
  Administered 2023-05-02: 50 ug via INTRAVENOUS

## 2023-05-02 MED ORDER — GENTAMICIN SULFATE 40 MG/ML IJ SOLN
420.0000 mg | INTRAVENOUS | Status: AC
  Administered 2023-05-02: 420 mg via INTRAVENOUS
  Filled 2023-05-02: qty 10.5

## 2023-05-02 MED ORDER — DEXAMETHASONE SODIUM PHOSPHATE 10 MG/ML IJ SOLN
INTRAMUSCULAR | Status: DC | PRN
  Administered 2023-05-02: 4 mg via INTRAVENOUS

## 2023-05-02 MED ORDER — LIDOCAINE HCL (PF) 2 % IJ SOLN
INTRAMUSCULAR | Status: AC
Start: 2023-05-02 — End: ?
  Filled 2023-05-02: qty 5

## 2023-05-02 MED ORDER — FENTANYL CITRATE PF 50 MCG/ML IJ SOSY
25.0000 ug | PREFILLED_SYRINGE | INTRAMUSCULAR | Status: DC | PRN
Start: 2023-05-02 — End: 2023-05-02
  Administered 2023-05-02 (×3): 50 ug via INTRAVENOUS

## 2023-05-02 MED ORDER — PROPOFOL 10 MG/ML IV BOLUS
INTRAVENOUS | Status: AC
  Filled 2023-05-02: qty 20

## 2023-05-02 MED ORDER — FENTANYL CITRATE (PF) 100 MCG/2ML IJ SOLN
INTRAMUSCULAR | Status: AC
  Filled 2023-05-02: qty 2

## 2023-05-02 MED ORDER — FENTANYL CITRATE PF 50 MCG/ML IJ SOSY
PREFILLED_SYRINGE | INTRAMUSCULAR | Status: AC
  Filled 2023-05-02: qty 2

## 2023-05-02 MED ORDER — LACTATED RINGERS IV SOLN
INTRAVENOUS | Status: DC | PRN
Start: 2023-05-02 — End: 2023-05-02

## 2023-05-02 MED ORDER — FENTANYL CITRATE PF 50 MCG/ML IJ SOSY
PREFILLED_SYRINGE | INTRAMUSCULAR | Status: AC
  Filled 2023-05-02: qty 1

## 2023-05-02 MED ORDER — SODIUM CHLORIDE 0.9 % IV SOLN
1.0000 g | INTRAVENOUS | Status: AC
  Administered 2023-05-02: 1 g via INTRAVENOUS
  Filled 2023-05-02: qty 1000

## 2023-05-02 MED ORDER — ONDANSETRON HCL 4 MG/2ML IJ SOLN
INTRAMUSCULAR | Status: AC
  Filled 2023-05-02: qty 2

## 2023-05-02 MED ORDER — ACETAMINOPHEN 500 MG PO TABS
ORAL_TABLET | ORAL | Status: AC
  Filled 2023-05-02: qty 2

## 2023-05-02 MED ORDER — ONDANSETRON HCL 4 MG/2ML IJ SOLN
INTRAMUSCULAR | Status: DC | PRN
  Administered 2023-05-02: 4 mg via INTRAVENOUS

## 2023-05-02 MED ORDER — OXYCODONE HCL 5 MG PO TABS: 5.0000 mg | ORAL_TABLET | Freq: Once | ORAL | Status: DC | PRN

## 2023-05-02 MED ORDER — SODIUM CHLORIDE 0.9 % IR SOLN
Status: DC | PRN
  Administered 2023-05-02: 3000 mL via INTRAVESICAL

## 2023-05-02 MED ORDER — LIDOCAINE 2% (20 MG/ML) 5 ML SYRINGE
INTRAMUSCULAR | Status: DC | PRN
  Administered 2023-05-02: 100 mg via INTRAVENOUS

## 2023-05-02 MED ORDER — PHENYLEPHRINE 80 MCG/ML (10ML) SYRINGE FOR IV PUSH (FOR BLOOD PRESSURE SUPPORT)
PREFILLED_SYRINGE | INTRAVENOUS | Status: AC
  Filled 2023-05-02: qty 10

## 2023-05-02 MED ORDER — METHOCARBAMOL 750 MG PO TABS: 750.0000 mg | ORAL_TABLET | Freq: Four times a day (QID) | ORAL | 0 refills | Status: AC

## 2023-05-02 SURGICAL SUPPLY — 20 items
BAG URO CATCHER STRL LF (MISCELLANEOUS) ×1 IMPLANT
BASKET ZERO TIP NITINOL 2.4FR (BASKET) IMPLANT
CATH URETL OPEN 5X70 (CATHETERS) ×1 IMPLANT
CLOTH BEACON ORANGE TIMEOUT ST (SAFETY) ×1 IMPLANT
EXTRACTOR STONE 1.7FRX115CM (UROLOGICAL SUPPLIES) IMPLANT
FIBER LASER MOSES 200 DFL (Laser) IMPLANT
FIBER LASER MOSES 365 DFL (Laser) IMPLANT
GLOVE SURG LX STRL 8.0 MICRO (GLOVE) ×1 IMPLANT
GOWN STRL REUS W/ TWL XL LVL3 (GOWN DISPOSABLE) ×1 IMPLANT
GUIDEWIRE ANG ZIPWIRE 038X150 (WIRE) IMPLANT
GUIDEWIRE STR DUAL SENSOR (WIRE) ×1 IMPLANT
KIT TURNOVER KIT A (KITS) IMPLANT
MANIFOLD NEPTUNE II (INSTRUMENTS) ×1 IMPLANT
PACK CYSTO (CUSTOM PROCEDURE TRAY) ×1 IMPLANT
SHEATH NAVIGATOR HD 12/14X28 (SHEATH) IMPLANT
SHEATH NAVIGATOR HD 12/14X36 (SHEATH) IMPLANT
STENT URET 6FRX26 CONTOUR (STENTS) IMPLANT
TRACTIP FLEXIVA PULSE ID 200 (Laser) IMPLANT
TUBING CONNECTING 10 (TUBING) ×1 IMPLANT
TUBING UROLOGY SET (TUBING) ×1 IMPLANT

## 2023-05-02 NOTE — Op Note (Signed)
 Preoperative diagnosis: left ureteral calculus  Postoperative diagnosis: Left ureteral calculus  Procedure:  Cystoscopy left ureteroscopy and stone removal Ureteroscopic laser lithotripsy left 28F x 26 ureteral stent placement w/ strings   Surgeon: Dr. Vilma Prader  Anesthesia: General  Complications: None  Intraoperative findings:  2 left large distal ureteral stones fragmented and basketed out. 6 x 26 double-J stent left in the left ureter with strings attached  EBL: Minimal  Specimens: left ureteral calculus  Disposition of specimens: [facility] for stone analysis  Indication: [name] is a 83 y.o.   patient with a distal left ureteral stone and associated left symptoms. After reviewing the management options for treatment, the patient elected to proceed with the above surgical procedure(s). We have discussed the potential benefits and risks of the procedure, side effects of the proposed treatment, the likelihood of the patient achieving the goals of the procedure, and any potential problems that might occur during the procedure or recuperation. Informed consent has been obtained.   Description of procedure:  The patient was taken to the operating room and general anesthesia was induced.  The patient was placed in the dorsal lithotomy position, prepped and draped in the usual sterile fashion, and preoperative antibiotics were administered. A preoperative time-out was performed.   Cystourethroscopy was performed.  The patient's urethra was examined and was normal.  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  Pan cystoscopy was performed and the bladder systematically examined in its entirety. There was no evidence for any bladder tumors, stones, or other mucosal pathology.    Attention then turned to the left ureteral orifice.  A 0.38 sensor guidewire was then advanced up the left ureter into the renal  pelvis under fluoroscopic guidance. The 4.5 Fr semirigid ureteroscope was then advanced into the ureter next to the guidewire and the calculus was identified.   The stone was then fragmented with the 365 micron holmium laser fiber on a setting of 0.6 and frequency of 6 Hz.   All stones were then removed from the ureter with an N-gage nitinol basket.  Reinspection of the ureter revealed no remaining visible stones or fragments.   The semirigid ureteroscope was removed.  And the stent was then loaded onto the safety wire and placed under fluoroscopic guidance.  Problems with the bladder as well as the kidney confirmed with fluoroscopy.  The stents were left attached.  Cystoscope was then driven into the bladder and all the stones were drained out.  The bladder was then emptied and the procedure ended.  The patient appeared to tolerate the procedure well and without complications.  The patient was able to be awakened and transferred to the recovery unit in satisfactory condition.   Disposition: The tether of the stent was left on and secured to the ventral aspect of the patient's penis.. Instructions for removing the stent have been provided to the patient. The patient has been scheduled for followup in 6 weeks with a renal ultrasound.

## 2023-05-02 NOTE — Discharge Instructions (Signed)
 DISCHARGE INSTRUCTIONS FOR Ureteroscopy and/or Ureteral Stent Placement  MEDICATIONS:  1.  Robaxin 2. Tamsulosin  3. Pyridium  ACTIVITY:  1. No strenuous activity x 1week  2. No driving while on narcotic pain medications  3. Drink plenty of water  4. Continue to walk at home - it is normal to see blood in the urine while the stent is in place, so keep active, but don't over do it.  5. May return to work/school tomorrow or when you feel ready  6. You may experience some pain when urinating in the kidney on the side that was operated on while the stent is in place this is normal  WHAT IS NORMAL TO EXPERIENCE: It is normal to feel the urge to urinate while the stent is in place It is normal to have blood in your urine while the stent is in place  It sometimes can be normal to have pain in your kidney when you urinate   BATHING:  1. You can shower and we recommend daily showers  2. You have a string coming from your urethra: The stent string is attached to your ureteral stent. Do not pull on this.   DIET: You may return to your normal diet immediately. Because of the raw surface of your bladder, alcohol, spicy foods, acid type foods and drinks with caffeine may cause irritation or frequency and should be used in moderation. To keep your urine flowing freely and to avoid constipation, drink plenty of fluids during the day ( 8-10 glasses ). Tip: Avoid cranberry juice because it is very acidic.  SIGNS/SYMPTOMS TO CALL:  Please call us if you have a fever greater than 101.5, uncontrolled nausea/vomiting, uncontrolled pain, dizziness, unable to urinate, bloody urine with clots greater than the size of a quarter, chest pain, shortness of breath, leg swelling, leg pain, redness around wound, drainage from wound, or any other concerns or questions.   You can reach [place] at [phone].   FOLLOW-UP:  1. You may remove your stent in on Friday 05/05/23. To do this go into the shower, grab hold of the  tether coming from your urethra. Pull the tether consistent motion until the stent is removed from your body. The stent will be around 10 inches long with a curl on either end.  2. You you have been set up for f/u in 6-8 weeks

## 2023-05-02 NOTE — Anesthesia Procedure Notes (Signed)
 Procedure Name: LMA Insertion Date/Time: 05/02/2023 3:35 PM  Performed by: Jamelle Rushing, CRNAPre-anesthesia Checklist: Patient identified, Emergency Drugs available, Suction available, Patient being monitored and Timeout performed Patient Re-evaluated:Patient Re-evaluated prior to induction Oxygen Delivery Method: Circle system utilized Preoxygenation: Pre-oxygenation with 100% oxygen Induction Type: IV induction Ventilation: Mask ventilation without difficulty LMA: LMA inserted LMA Size: 4.0 Number of attempts: 1 Placement Confirmation: positive ETCO2, CO2 detector and breath sounds checked- equal and bilateral Tube secured with: Tape Dental Injury: Teeth and Oropharynx as per pre-operative assessment

## 2023-05-02 NOTE — Transfer of Care (Signed)
 Immediate Anesthesia Transfer of Care Note  Patient: [name]  Procedure(s) Performed: CYSTOSCOPY/URETEROSCOPY/HOLMIUM LASER/STENT PLACEMENT (Left)  Patient Location: PACU  Anesthesia Type:General  Level of Consciousness: drowsy  Airway & Oxygen Therapy: Patient Spontanous Breathing and Patient connected to face mask oxygen  Post-op Assessment: Report given to RN and Post -op Vital signs reviewed and stable  Post vital signs: Reviewed and stable  Last Vitals:  Vitals Value Taken Time  BP 115/79 05/02/23 1647  Temp 36.9 C 05/02/23 1647  Pulse 59 05/02/23 1654  Resp 15 05/02/23 1654  SpO2 99 % 05/02/23 1654  Vitals shown include unfiled device data.  Last Pain:  Vitals:   05/02/23 1647  TempSrc:   PainSc: 0-No pain      Patients Stated Pain Goal: 0 (05/02/23 1256)  Complications: No notable events documented.

## 2023-05-02 NOTE — Anesthesia Postprocedure Evaluation (Signed)
 Anesthesia Post Note  Patient: [name]  Procedure(s) Performed: CYSTOSCOPY/URETEROSCOPY/HOLMIUM LASER/STENT PLACEMENT (Left)     Patient location during evaluation: PACU Anesthesia Type: General Level of consciousness: awake Pain management: pain level controlled Vital Signs Assessment: post-procedure vital signs reviewed and stable Respiratory status: spontaneous breathing, nonlabored ventilation and respiratory function stable Cardiovascular status: blood pressure returned to baseline and stable Postop Assessment: no apparent nausea or vomiting Anesthetic complications: no   No notable events documented.  Last Vitals:  Vitals:   05/02/23 1730 05/02/23 1745  BP: (!) 156/93 (!) 149/97  Pulse: 61 66  Resp: 13 18  Temp:    SpO2: 92% 92%    Last Pain:  Vitals:   05/02/23 1745  TempSrc:   PainSc: 2                  Linton Rump

## 2023-05-02 NOTE — Anesthesia Preprocedure Evaluation (Addendum)
 Anesthesia Evaluation  Patient identified by MRN, date of birth, ID band Patient awake    Reviewed: Allergy & Precautions, NPO status , Patient's Chart, lab work & pertinent test results  History of Anesthesia Complications Negative for: history of anesthetic complications  Airway Mallampati: II  TM Distance: >3 FB Neck ROM: Full    Dental  (+) Upper Dentures, Lower Dentures   Pulmonary neg shortness of breath, neg sleep apnea, neg COPD, neg recent URI, PE   Pulmonary exam normal breath sounds clear to auscultation       Cardiovascular hypertension (carvedilol), Pt. on home beta blockers + dysrhythmias (PVCs, NSVT, PACs) + Valvular Problems/Murmurs (mild) MR  Rhythm:Regular Rate:Normal  TTE 12/16/2022: IMPRESSIONS    1. Left ventricular ejection fraction, by estimation, is 60 to 65%. The  left ventricle has normal function. The left ventricle has no regional  wall motion abnormalities. Left ventricular diastolic parameters were  normal.   2. Right ventricular systolic function is normal. The right ventricular  size is normal.   3. Left atrial size was moderately dilated.   4. The mitral valve is abnormal. Mild mitral valve regurgitation. No  evidence of mitral stenosis. Moderate mitral annular calcification.   5. The aortic valve is tricuspid. There is mild calcification of the  aortic valve. Aortic valve regurgitation is mild. Aortic valve sclerosis  is present, with no evidence of aortic valve stenosis.   6. Aortic dilatation noted. 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   7. The inferior vena cava is normal in size with greater than 50%  respiratory variability, suggesting right atrial pressure of 3 mmHg.     Neuro/Psych neg Seizures PSYCHIATRIC DISORDERS Anxiety     H/o brain tumor at 83 yo    GI/Hepatic negative GI ROS, Neg liver ROS,,,   Endo/Other  negative endocrine ROS    Renal/GU Renal disease (stones)     Musculoskeletal  (+) Arthritis ,  Fibromyalgia -  Abdominal   Peds  Hematology Lab Results      Component                Value               Date                      WBC                      8.4                 04/21/2023                HGB                      14.7                04/21/2023                HCT                      44.3                04/21/2023                MCV  98.4                04/21/2023                PLT                      303                 04/21/2023              Anesthesia Other Findings Last Xarelto: 04/28/2023  Reproductive/Obstetrics                             Anesthesia Physical Anesthesia Plan  ASA: 3  Anesthesia Plan: General   Post-op Pain Management: Tylenol PO (pre-op)*   Induction: Intravenous  PONV Risk Score and Plan: 2 and Ondansetron, Dexamethasone and Treatment may vary due to age or medical condition  Airway Management Planned: LMA  Additional Equipment:   Intra-op Plan:   Post-operative Plan: Extubation in OR  Informed Consent: I have reviewed the patients History and Physical, chart, labs and discussed the procedure including the risks, benefits and alternatives for the proposed anesthesia with the patient or authorized representative who has indicated his/her understanding and acceptance.     Dental advisory given  Plan Discussed with: CRNA and Anesthesiologist  Anesthesia Plan Comments: (Risks of general anesthesia discussed including, but not limited to, sore throat, hoarse voice, chipped/damaged teeth, injury to vocal cords, nausea and vomiting, allergic reactions, lung infection, heart attack, stroke, and death. All questions answered. )        Anesthesia Quick Evaluation

## 2023-05-03 ENCOUNTER — Encounter (HOSPITAL_COMMUNITY): Payer: Self-pay | Admitting: Urology

## 2023-05-04 DIAGNOSIS — Z4802 Encounter for removal of sutures: Secondary | ICD-10-CM | POA: Diagnosis not present

## 2023-05-05 DIAGNOSIS — R310 Gross hematuria: Secondary | ICD-10-CM | POA: Diagnosis not present

## 2023-05-05 DIAGNOSIS — N200 Calculus of kidney: Secondary | ICD-10-CM | POA: Diagnosis not present

## 2023-05-09 LAB — CALCULI, WITH PHOTOGRAPH (CLINICAL LAB)
Calcium Oxalate Dihydrate: 10 %
Calcium Oxalate Monohydrate: 90 %
Weight Calculi: 90 mg

## 2023-05-11 DIAGNOSIS — C44311 Basal cell carcinoma of skin of nose: Secondary | ICD-10-CM | POA: Diagnosis not present

## 2023-05-15 ENCOUNTER — Other Ambulatory Visit: Payer: Self-pay | Admitting: Adult Health

## 2023-05-15 DIAGNOSIS — M150 Primary generalized (osteo)arthritis: Secondary | ICD-10-CM

## 2023-05-16 NOTE — Telephone Encounter (Signed)
 Okay for refill?

## 2023-07-19 ENCOUNTER — Ambulatory Visit (INDEPENDENT_AMBULATORY_CARE_PROVIDER_SITE_OTHER): Admitting: Adult Health

## 2023-07-19 ENCOUNTER — Encounter: Payer: Self-pay | Admitting: Adult Health

## 2023-07-19 VITALS — BP 120/88 | HR 72 | Temp 97.8°F | Ht 72.0 in | Wt 173.0 lb

## 2023-07-19 DIAGNOSIS — H8113 Benign paroxysmal vertigo, bilateral: Secondary | ICD-10-CM | POA: Diagnosis not present

## 2023-07-19 DIAGNOSIS — T50905A Adverse effect of unspecified drugs, medicaments and biological substances, initial encounter: Secondary | ICD-10-CM | POA: Diagnosis not present

## 2023-07-19 MED ORDER — MECLIZINE HCL 50 MG PO TABS: 25.0000 mg | ORAL_TABLET | Freq: Three times a day (TID) | ORAL | 0 refills | Status: AC | PRN

## 2023-07-19 NOTE — Progress Notes (Signed)
 Subjective:    Patient ID: [name], male    DOB: February 01, 1941, 83 y.o.   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  He presents to the office today for an acute issue.  He reports that over the last 2 weeks he has been experiencing dizziness, like the world is spinning around him, headaches, worsening tinnitus, and fatigue.  He has had vertigo in the past and symptoms seem to be similar.  He has also noticed that when he takes his prescribed tramadol  for arthritic pain ( most of the pain is in his low back)the dizziness and headaches will get worse and he is also experiencing some abdominal pain and nausea.  He would like to get off tramadol .  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   Colonic mass 2001   Fatigue    x 1 week o as of 02-17-2020   Fibromyalgia 1990's    Frequent PVCs    Generalized body aches    x 1 week as of 02-17-2020   H/O inguinal hernia    right side   Hernia, umbilical    History of kidney stones    HX OF MULTIPLE KIDNEY STONES AND  CURRENTLY HAS BILATERAL RENAL STONES CAUSING ABDOMINAL AND BACK PAIN   Hypertension    high blood pressure readings    NSVT (nonsustained ventricular tachycardia) (HCC)    Pneumonia    Premature atrial contractions    Wears dentures    full dentures    Social History   Socioeconomic  History   Marital status: Married    Spouse name: Not on file   Number of children: 1   Years of education: Not on file   Highest education level: Not on file  Occupational History   Not on file  Tobacco Use   Smoking status: Never   Smokeless tobacco: Never  Vaping Use   Vaping status: Never Used  Substance and Sexual Activity   Alcohol use: Not Currently    Alcohol/week: 7.0 standard drinks of alcohol    Types: 7 Standard drinks or equivalent per week   Drug use: Not Currently   Sexual activity: Not Currently  Other Topics Concern   Not on file  Social History Narrative   Retired from Engineer, manufacturing    Has one son - lives local    Likes to play Tennis   Social Drivers of Corporate investment banker Strain: Low Risk  (05/06/2021)   Overall Financial Resource Strain (CARDIA)    Difficulty of Paying Living Expenses: Not hard at all  Food Insecurity: No Food Insecurity (10/06/2022)   Hunger Vital Sign    Worried About Running Out of Food in the Last Year: Never true    Ran Out of Food in the Last Year: Never true  Transportation Needs: No Transportation Needs (  10/06/2022)   PRAPARE - Administrator, Civil Service (Medical): No    Lack of Transportation (Non-Medical): No  Physical Activity: Insufficiently Active (05/06/2021)   Exercise Vital Sign    Days of Exercise per Week: 1 day    Minutes of Exercise per Session: 90 min  Stress: No Stress Concern Present (05/06/2021)   [employer] of Occupational Health - Occupational Stress Questionnaire    Feeling of Stress : Not at all  Social Connections: Moderately Isolated (05/06/2021)   Social Connection and Isolation Panel [NHANES]    Frequency of Communication with Friends and Family: More than three times a week    Frequency of Social Gatherings with Friends and Family: More than three times a week    Attends Religious Services: Never    Database administrator or Organizations: Yes    Attends Museum/gallery exhibitions officer: More than 4 times per year    Marital Status: Never married  Intimate Partner Violence: Not At Risk (09/22/2022)   Humiliation, Afraid, Rape, and Kick questionnaire    Fear of Current or Ex-Partner: No    Emotionally Abused: No    Physically Abused: No    Sexually Abused: No    Past Surgical History:  Procedure Laterality Date   BRAIN TUMOR EXCISION  age 34   CARDIAC CATHETERIZATION N/A 04/09/2015   Procedure: Left Heart Cath and Coronary Angiography;  Surgeon: Odie Benne, MD;  Location: [facility];  Service: Cardiovascular;  Laterality: N/A;   COLON SURGERY  2001   COLON RESECTION FOR GROWTH - NOT CANCER   CYSTOSCOPY WITH RETROGRADE PYELOGRAM, URETEROSCOPY AND STENT PLACEMENT Left 10/05/2012   Procedure: CYSTOSCOPY WITH RETROGRADE PYELOGRAM, URETEROSCOPY AND STENT PLACEMENT;  Surgeon: Osborn Blaze, MD;  Location: [facility];  Service: Urology;  Laterality: Left;   CYSTOSCOPY/RETROGRADE/URETEROSCOPY/STONE EXTRACTION WITH BASKET Right 10/05/2012   Procedure: CYSTOSCOPY/RETROGRADE/URETEROSCOPY/STONE EXTRACTION WITH BASKET;  Surgeon: Osborn Blaze, MD;  Location: [facility];  Service: Urology;  Laterality: Right;   CYSTOSCOPY/RETROGRADE/URETEROSCOPY/STONE EXTRACTION WITH BASKET Bilateral 10/24/2012   Procedure: CYSTOSCOPY/RETROGRADE/URETEROSCOPY/STONE EXTRACTION WITH BASKET;  Surgeon: Osborn Blaze, MD;  Location: [facility];  Service: Urology;  Laterality: Bilateral;  with bilateral stent placement   CYSTOSCOPY/URETEROSCOPY/HOLMIUM LASER/STENT PLACEMENT Left 05/02/2023   Procedure: CYSTOSCOPY/URETEROSCOPY/HOLMIUM LASER/STENT PLACEMENT;  Surgeon: Thelbert Finner, MD;  Location: [facility];  Service: Urology;  Laterality: Left;   HOLMIUM LASER APPLICATION Right 10/05/2012   Procedure: HOLMIUM LASER APPLICATION;  Surgeon: Osborn Blaze, MD;  Location: [facility];  Service: Urology;  Laterality: Right;   HOLMIUM LASER APPLICATION Bilateral 10/24/2012   Procedure: HOLMIUM  LASER APPLICATION;  Surgeon: Osborn Blaze, MD;  Location: [facility];  Service: Urology;  Laterality: Bilateral;   IR ANGIOGRAM PULMONARY BILATERAL SELECTIVE  09/21/2022   IR INFUSION THROMBOL ARTERIAL INITIAL (MS)  09/21/2022   IR INFUSION THROMBOL ARTERIAL INITIAL (MS)  09/21/2022   IR IVC FILTER PLMT / S&I /IMG GUID/MOD SED  09/25/2022   IR THROMB F/U EVAL ART/VEN FINAL DAY (MS)  09/22/2022   IR US  GUIDE VASC ACCESS RIGHT  09/21/2022   LITHOTRIPSY     TONSILLECTOMY  age 89's   and adenoids    Family History  Problem Relation Age of Onset   Colon cancer Father    Heart disease Father        quad bypass    Allergies  Allergen Reactions   Other Other (See Comments)    Mangos - caused a severe rash   Prozac  [  Fluoxetine  Hcl]     Made him feel loopy   Sulfa Antibiotics Hives   Vicodin [Hydrocodone -Acetaminophen ] Itching    Tolerates oxycodone    Erythromycin Rash    Many years ago an oral antibiotic caused a rash(wife and patient think it was erythromycin but they are not positive).    Current Outpatient Medications on File Prior to Visit  Medication Sig Dispense Refill   carvedilol  (COREG ) 3.125 MG tablet Take 1 tablet (3.125 mg total) by mouth 2 (two) times daily. 180 tablet 1   Misc Natural Products (OSTEO BI-FLEX ADV JOINT SHIELD PO) Take 2 capsules by mouth daily.     Multiple Vitamin (MULTIVITAMIN) tablet Take 1 tablet by mouth daily.     ondansetron  (ZOFRAN -ODT) 4 MG disintegrating tablet 4mg  ODT q4 hours prn nausea/vomit 10 tablet 0   rivaroxaban  (XARELTO ) 20 MG TABS tablet Take 1 tablet (20 mg total) by mouth daily with supper. 30 tablet 11   tamsulosin  (FLOMAX ) 0.4 MG CAPS capsule TAKE 1 CAPSULE BY MOUTH EVERY DAY 90 capsule 3   traMADol  (ULTRAM ) 50 MG tablet TAKE 1 TABLET BY MOUTH EVERY 8 HOURS. 90 tablet 2   No current facility-administered medications on file prior to visit.    BP 120/88   Pulse 72   Temp 97.8 F (36.6 C) (Oral)   Ht 6' (1.829 m)   Wt 173 lb (78.5  kg)   SpO2 97%   BMI 23.46 kg/m       Objective:   Physical Exam Vitals and nursing note reviewed.  Constitutional:      Appearance: Normal appearance.  Eyes:     Extraocular Movements:     Right eye: Nystagmus (horizontal) present.     Left eye: Nystagmus (horizontal) present.  Cardiovascular:     Rate and Rhythm: Normal rate and regular rhythm.     Pulses: Normal pulses.     Heart sounds: Normal heart sounds.  Musculoskeletal:        General: Normal range of motion.  Skin:    General: Skin is warm and dry.  Neurological:     General: No focal deficit present.     Mental Status: He is oriented to person, place, and time.  Psychiatric:        Mood and Affect: Mood normal.        Behavior: Behavior normal.        Thought Content: Thought content normal.        Judgment: Judgment normal.        Assessment & Plan:   1. Benign paroxysmal positional vertigo due to bilateral vestibular disorder (Primary) - Will start on Meclziine. If not improving can refer to PT  - Follow up in 2 weeks  - meclizine  (ANTIVERT ) 50 MG tablet; Take 0.5 tablets (25 mg total) by mouth 3 (three) times daily as needed.  Dispense: 90 tablet; Refill: 0  2. Adverse effect of drug, initial encounter - Will wean him off Tramadol   - Follow up in 2 weeks.  - Likely need to refer to pain management such as [facility] but he would like to wait until he comes back to discuss this   Time spent with patient today was 31 minutes which consisted of chart review, discussing dizziness, abdominal pain, medication management, work up, treatment answering questions and documentation.

## 2023-07-19 NOTE — Patient Instructions (Addendum)
 Take one tablet daily for a week and then take one tablet every 2-3 days for a week and then stop the medication completely.   I am also going to start you on Meclizine  for the vertigo.   Lets follow up in two weeks

## 2023-08-02 ENCOUNTER — Encounter: Payer: Self-pay | Admitting: Adult Health

## 2023-08-02 ENCOUNTER — Ambulatory Visit (INDEPENDENT_AMBULATORY_CARE_PROVIDER_SITE_OTHER): Admitting: Adult Health

## 2023-08-02 VITALS — BP 130/62 | HR 63 | Temp 97.5°F | Ht 72.0 in | Wt 173.0 lb

## 2023-08-02 DIAGNOSIS — M542 Cervicalgia: Secondary | ICD-10-CM | POA: Diagnosis not present

## 2023-08-02 DIAGNOSIS — G8929 Other chronic pain: Secondary | ICD-10-CM

## 2023-08-02 DIAGNOSIS — H8113 Benign paroxysmal vertigo, bilateral: Secondary | ICD-10-CM

## 2023-08-02 DIAGNOSIS — M1990 Unspecified osteoarthritis, unspecified site: Secondary | ICD-10-CM | POA: Diagnosis not present

## 2023-08-02 NOTE — Progress Notes (Signed)
 Subjective:    Patient ID: [name], male    DOB: 1940/07/29, 83 y.o.   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  He presents to the office today for 2-week follow-up regarding dizziness.  When he was seen 2 weeks ago he reported that over the previous 2 weeks he had an experiencing dizziness that was described as things spinning around him, headache, worsening tinnitus, and fatigue.  He has a known history of vertigo and felt as though his symptoms were similar in nature the last time he had vertigo.  He had also noticed that when he was taking his tramadol  for arthritic pain the dizziness and headaches would get worse and that he would also experience abdominal pain and nausea.  He wanted to stop taking tramadol  so we put together titration plan for him to completely come off the medication.  He was also sent in a course of meclizine .  Today he reports that he continues to feel dizzy, especially when changing positions and turning his head. Meclizine  did not help much.   Since stopping Tramadol  he continues to have pain, especially in his cervical spine. He does not feel like Tramadol  did much in the way of pain relief. 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   Colonic mass 2001   Fatigue    x 1 week  o as of 02-17-2020   Fibromyalgia 1990's    Frequent PVCs    Generalized body aches    x 1 week as of 02-17-2020   H/O inguinal hernia    right side   Hernia, umbilical    History of kidney stones    HX OF MULTIPLE KIDNEY STONES AND  CURRENTLY HAS BILATERAL RENAL STONES CAUSING ABDOMINAL AND BACK PAIN   Hypertension    high blood pressure readings    NSVT (nonsustained ventricular tachycardia) (HCC)    Pneumonia    Premature atrial contractions    Wears dentures    full dentures    Social History   Socioeconomic History   Marital status: Married    Spouse name: Not on file   Number of children: 1   Years of education: Not on file   Highest education level: Not on file  Occupational History   Not on file  Tobacco Use   Smoking status: Never   Smokeless tobacco: Never  Vaping Use   Vaping status: Never Used  Substance and Sexual Activity   Alcohol use: Not Currently    Alcohol/week: 7.0 standard drinks of alcohol    Types: 7 Standard drinks or equivalent per week   Drug use: Not Currently   Sexual activity: Not Currently  Other Topics Concern   Not on file  Social History Narrative   Retired from Engineer, manufacturing  Has one son - lives local    Likes to play Tennis   Social Drivers of Health   Financial Resource Strain: Low Risk  (05/06/2021)   Overall Financial Resource Strain (CARDIA)    Difficulty of Paying Living Expenses: Not hard at all  Food Insecurity: No Food Insecurity (10/06/2022)   Hunger Vital Sign    Worried About Running Out of Food in the Last Year: Never true    Ran Out of Food in the Last Year: Never true  Transportation Needs: No Transportation Needs (10/06/2022)   PRAPARE - Administrator, Civil Service (Medical): No    Lack of Transportation (Non-Medical): No  Physical Activity: Insufficiently Active (05/06/2021)   Exercise Vital Sign    Days of Exercise per Week: 1 day    Minutes of Exercise per Session: 90 min  Stress: No Stress  Concern Present (05/06/2021)   [employer] of Occupational Health - Occupational Stress Questionnaire    Feeling of Stress : Not at all  Social Connections: Moderately Isolated (05/06/2021)   Social Connection and Isolation Panel    Frequency of Communication with Friends and Family: More than three times a week    Frequency of Social Gatherings with Friends and Family: More than three times a week    Attends Religious Services: Never    Database administrator or Organizations: Yes    Attends Engineer, structural: More than 4 times per year    Marital Status: Never married  Intimate Partner Violence: Not At Risk (09/22/2022)   Humiliation, Afraid, Rape, and Kick questionnaire    Fear of Current or Ex-Partner: No    Emotionally Abused: No    Physically Abused: No    Sexually Abused: No    Past Surgical History:  Procedure Laterality Date   BRAIN TUMOR EXCISION  age 65   CARDIAC CATHETERIZATION N/A 04/09/2015   Procedure: Left Heart Cath and Coronary Angiography;  Surgeon: Lonni JONETTA Cash, MD;  Location: [facility];  Service: Cardiovascular;  Laterality: N/A;   COLON SURGERY  2001   COLON RESECTION FOR GROWTH - NOT CANCER   CYSTOSCOPY WITH RETROGRADE PYELOGRAM, URETEROSCOPY AND STENT PLACEMENT Left 10/05/2012   Procedure: CYSTOSCOPY WITH RETROGRADE PYELOGRAM, URETEROSCOPY AND STENT PLACEMENT;  Surgeon: Ricardo Likens, MD;  Location: [facility];  Service: Urology;  Laterality: Left;   CYSTOSCOPY/RETROGRADE/URETEROSCOPY/STONE EXTRACTION WITH BASKET Right 10/05/2012   Procedure: CYSTOSCOPY/RETROGRADE/URETEROSCOPY/STONE EXTRACTION WITH BASKET;  Surgeon: Ricardo Likens, MD;  Location: [facility];  Service: Urology;  Laterality: Right;   CYSTOSCOPY/RETROGRADE/URETEROSCOPY/STONE EXTRACTION WITH BASKET Bilateral 10/24/2012   Procedure: CYSTOSCOPY/RETROGRADE/URETEROSCOPY/STONE EXTRACTION WITH BASKET;  Surgeon: Ricardo Likens, MD;  Location: [facility];  Service: Urology;  Laterality:  Bilateral;  with bilateral stent placement   CYSTOSCOPY/URETEROSCOPY/HOLMIUM LASER/STENT PLACEMENT Left 05/02/2023   Procedure: CYSTOSCOPY/URETEROSCOPY/HOLMIUM LASER/STENT PLACEMENT;  Surgeon: Shane Steffan BROCKS, MD;  Location: [facility];  Service: Urology;  Laterality: Left;   HOLMIUM LASER APPLICATION Right 10/05/2012   Procedure: HOLMIUM LASER APPLICATION;  Surgeon: Ricardo Likens, MD;  Location: [facility];  Service: Urology;  Laterality: Right;   HOLMIUM LASER APPLICATION Bilateral 10/24/2012   Procedure: HOLMIUM LASER APPLICATION;  Surgeon: Ricardo Likens, MD;  Location: [facility];  Service: Urology;  Laterality: Bilateral;   IR ANGIOGRAM PULMONARY BILATERAL SELECTIVE  09/21/2022   IR INFUSION THROMBOL ARTERIAL INITIAL (MS)  09/21/2022   IR INFUSION THROMBOL ARTERIAL INITIAL (MS)  09/21/2022   IR IVC FILTER PLMT / S&I PORTER GUID/MOD SED  09/25/2022  IR THROMB F/U EVAL ART/VEN FINAL DAY (MS)  09/22/2022   IR US  GUIDE VASC ACCESS RIGHT  09/21/2022   LITHOTRIPSY     TONSILLECTOMY  age 15's   and adenoids    Family History  Problem Relation Age of Onset   Colon cancer Father    Heart disease Father        quad bypass    Allergies  Allergen Reactions   Other Other (See Comments)    Mangos - caused a severe rash   Prozac  [Fluoxetine  Hcl]     Made him feel loopy   Sulfa Antibiotics Hives   Vicodin [Hydrocodone -Acetaminophen ] Itching    Tolerates oxycodone    Erythromycin Rash    Many years ago an oral antibiotic caused a rash(wife and patient think it was erythromycin but they are not positive).    Current Outpatient Medications on File Prior to Visit  Medication Sig Dispense Refill   carvedilol  (COREG ) 3.125 MG tablet Take 1 tablet (3.125 mg total) by mouth 2 (two) times daily. 180 tablet 1   meclizine  (ANTIVERT ) 50 MG tablet Take 0.5 tablets (25 mg total) by mouth 3 (three) times daily as needed. 90 tablet 0   Misc Natural Products (OSTEO BI-FLEX ADV JOINT SHIELD PO) Take 2 capsules by  mouth daily.     Multiple Vitamin (MULTIVITAMIN) tablet Take 1 tablet by mouth daily.     ondansetron  (ZOFRAN -ODT) 4 MG disintegrating tablet 4mg  ODT q4 hours prn nausea/vomit 10 tablet 0   rivaroxaban  (XARELTO ) 20 MG TABS tablet Take 1 tablet (20 mg total) by mouth daily with supper. 30 tablet 11   tamsulosin  (FLOMAX ) 0.4 MG CAPS capsule TAKE 1 CAPSULE BY MOUTH EVERY DAY 90 capsule 3   traMADol  (ULTRAM ) 50 MG tablet TAKE 1 TABLET BY MOUTH EVERY 8 HOURS. 90 tablet 2   No current facility-administered medications on file prior to visit.    BP 130/62   Pulse 63   Temp (!) 97.5 F (36.4 C) (Oral)   Ht 6' (1.829 m)   Wt 173 lb (78.5 kg)   SpO2 96%   BMI 23.46 kg/m       Objective:   Physical Exam Vitals and nursing note reviewed.  Constitutional:      Appearance: Normal appearance.   Cardiovascular:     Rate and Rhythm: Normal rate and regular rhythm.     Pulses: Normal pulses.     Heart sounds: Normal heart sounds.  Pulmonary:     Effort: Pulmonary effort is normal.     Breath sounds: Normal breath sounds.   Neurological:     General: No focal deficit present.     Mental Status: He is alert and oriented to person, place, and time.     Gait: Gait abnormal (walking with cane).   Psychiatric:        Mood and Affect: Mood normal.        Behavior: Behavior normal.        Thought Content: Thought content normal.        Judgment: Judgment normal.       Assessment & Plan:  1. Benign paroxysmal positional vertigo due to bilateral vestibular disorder (Primary)  - Ambulatory referral to Physical Therapy  2. Chronic osteoarthritis - Will refer to [facility]   3. Chronic neck pain  - Ambulatory referral to Physical Therapy  Charls Custer, NP  Time spent with patient today was 31 minutes which consisted of chart review, discussing diagnosis,  work up, treatment answering questions and documentation.

## 2023-08-10 ENCOUNTER — Ambulatory Visit: Admitting: Family Medicine

## 2023-08-10 DIAGNOSIS — Z0000 Encounter for general adult medical examination without abnormal findings: Secondary | ICD-10-CM

## 2023-08-10 NOTE — Progress Notes (Signed)
 PATIENT CHECK-IN and HEALTH RISK ASSESSMENT QUESTIONNAIRE:  -completed by phone/video for upcoming Medicare Preventive Visit    Pre-Visit Check-in: 1)Vitals (height, wt, BP, etc) - record in vitals section for visit on day of visit Request home vitals (wt, BP, etc.) and enter into vitals, THEN update Vital Signs SmartPhrase below at the top of the HPI. See below.  2)Review and Update Medications, Allergies PMH, Surgeries, Social history in Epic 3)Hospitalizations in the last year with date/reason? n  4)Review and Update Care Team (patient's specialists) in Epic 5) Complete PHQ9 in Epic  6) Complete Fall Screening in Epic 7)Review all Health Maintenance Due and order under PCP if not done.  Medicare Wellness Patient Questionnaire:  Answer theses question about your habits: How often do you have a drink containing alcohol?n How many drinks containing alcohol do you have on a typical day when you are drinking?na How often do you have six or more drinks on one occasion?na Have you ever smoked?n Quit date if applicable? na  How many packs a day do/did you smoke? na Do you use smokeless tobacco?n Do you use an illicit drugs?n On average, how many days per week do you engage in moderate to strenuous exercise (like a brisk walk)? Every day does walking or stationary bike On average, how many minutes do you engage in exercise at this level? 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 Do you have any difficulty performing your everyday activities?n Are you having any difficulty walking, taking medications  on your own, and or difficulty managing daily home needs?n Do you have difficulty walking or climbing stairs?n Do you have difficulty dressing or bathing?n Do you have difficulty doing errands alone such as visiting a doctor's office or shopping?n Do you currently have any difficulty preparing food and eating?n Do you currently have any difficulty using the toilet?n Do you have any difficulty managing your finances?n Do you have any difficulties with housekeeping of managing your housekeeping?n   Do you have Advanced Directives in place (Living Will, Healthcare Power or Attorney)? yes   Last eye Exam and location?[facility], goes every several years   Do you currently use prescribed or non-prescribed narcotic or opioid pain medications?n       ----------------------------------------------------------------------------------------------------------------------------------------------------------------------------------------------------------------------  Because this visit was a virtual/telehealth visit, some criteria may be missing or patient reported. Any vitals not documented were not able to be obtained and vitals that have been documented are patient reported.    MEDICARE ANNUAL PREVENTIVE CARE VISIT WITH PROVIDER (Welcome to Medicare, initial annual wellness or annual wellness exam)  Virtual Visit via Video Note  I connected with [name] on 08/10/23  by phone and verified that I am speaking with the correct person using two identifiers. Video visit didn't work and he preferred to talk by phone.   Location patient: home Location provider:work or home office Persons participating in the virtual visit: patient, provider  Concerns and/or follow up today: doing ok - stable.   See HM section in Epic for other details of completed HM.    ROS: negative for report of fevers, unintentional weight loss, vision changes,  vision loss, hearing loss or change, chest  pain, sob, hemoptysis, melena, hematochezia, hematuria, falls, bleeding or bruising, thoughts of suicide or self harm, memory loss  Patient-completed extensive health risk assessment - reviewed and discussed with the patient: See Health Risk Assessment completed with patient prior to the visit either above or in recent phone note. This was reviewed in detailed with the patient today and appropriate recommendations, orders and referrals were placed as needed per Summary below and patient instructions.   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   Colonic mass 2001   Fatigue    x 1 week o as of 02-17-2020   Fibromyalgia 1990's    Frequent PVCs    Generalized body aches    x 1 week as of 02-17-2020   H/O inguinal hernia    right side   Hernia, umbilical    History of kidney stones    HX OF MULTIPLE KIDNEY STONES AND  CURRENTLY HAS BILATERAL RENAL STONES CAUSING ABDOMINAL AND BACK PAIN   Hypertension    high blood pressure readings    NSVT (nonsustained ventricular tachycardia) (HCC)    Pneumonia    Premature atrial contractions    Wears dentures    full dentures    Past Surgical History:  Procedure Laterality Date   BRAIN TUMOR EXCISION  age 74   CARDIAC CATHETERIZATION N/A 04/09/2015   Procedure: Left Heart Cath and Coronary Angiography;  Surgeon: Lonni JONETTA Cash, MD;  Location: [facility] INVASIVE CV LAB;  Service: Cardiovascular;  Laterality: N/A;   COLON SURGERY  2001    COLON RESECTION FOR GROWTH - NOT CANCER   CYSTOSCOPY WITH RETROGRADE PYELOGRAM, URETEROSCOPY AND STENT PLACEMENT Left 10/05/2012   Procedure: CYSTOSCOPY WITH RETROGRADE PYELOGRAM, URETEROSCOPY AND STENT PLACEMENT;  Surgeon: Ricardo Likens, MD;  Location: [facility];  Service: Urology;  Laterality: Left;   CYSTOSCOPY/RETROGRADE/URETEROSCOPY/STONE EXTRACTION WITH BASKET Right 10/05/2012   Procedure: CYSTOSCOPY/RETROGRADE/URETEROSCOPY/STONE EXTRACTION WITH BASKET;  Surgeon: Ricardo Likens, MD;  Location: [facility];  Service: Urology;  Laterality: Right;   CYSTOSCOPY/RETROGRADE/URETEROSCOPY/STONE EXTRACTION WITH BASKET Bilateral 10/24/2012   Procedure: CYSTOSCOPY/RETROGRADE/URETEROSCOPY/STONE EXTRACTION WITH BASKET;  Surgeon: Ricardo Likens, MD;  Location: [facility];  Service: Urology;  Laterality: Bilateral;  with bilateral stent placement   CYSTOSCOPY/URETEROSCOPY/HOLMIUM LASER/STENT PLACEMENT Left 05/02/2023   Procedure: CYSTOSCOPY/URETEROSCOPY/HOLMIUM LASER/STENT PLACEMENT;  Surgeon: Shane Steffan BROCKS, MD;  Location: [facility];  Service: Urology;  Laterality: Left;   HOLMIUM LASER APPLICATION Right 10/05/2012   Procedure: HOLMIUM LASER APPLICATION;  Surgeon: Ricardo Likens, MD;  Location: [facility];  Service: Urology;  Laterality: Right;   HOLMIUM LASER APPLICATION Bilateral 10/24/2012   Procedure: HOLMIUM LASER APPLICATION;  Surgeon: Ricardo Likens, MD;  Location: [facility];  Service: Urology;  Laterality: Bilateral;   IR ANGIOGRAM PULMONARY BILATERAL SELECTIVE  09/21/2022   IR INFUSION THROMBOL ARTERIAL INITIAL (MS)  09/21/2022   IR INFUSION THROMBOL ARTERIAL INITIAL (MS)  09/21/2022   IR IVC FILTER PLMT /  S&I /IMG GUID/MOD SED  09/25/2022   IR THROMB F/U EVAL ART/VEN FINAL DAY (MS)  09/22/2022   IR US  GUIDE VASC ACCESS RIGHT  09/21/2022   LITHOTRIPSY     TONSILLECTOMY  age 10's   and adenoids    Social History   Socioeconomic History   Marital status: Married    Spouse name: Not on file   Number of children: 1    Years of education: Not on file   Highest education level: Not on file  Occupational History   Not on file  Tobacco Use   Smoking status: Never   Smokeless tobacco: Never  Vaping Use   Vaping status: Never Used  Substance and Sexual Activity   Alcohol use: Not Currently    Alcohol/week: 7.0 standard drinks of alcohol    Types: 7 Standard drinks or equivalent per week   Drug use: Not Currently   Sexual activity: Not Currently  Other Topics Concern   Not on file  Social History Narrative   Retired from Engineer, manufacturing    Has one son - lives local    Likes to play Tennis   Social Drivers of Health   Financial Resource Strain: Low Risk  (05/06/2021)   Overall Financial Resource Strain (CARDIA)    Difficulty of Paying Living Expenses: Not hard at all  Food Insecurity: No Food Insecurity (10/06/2022)   Hunger Vital Sign    Worried About Running Out of Food in the Last Year: Never true    Ran Out of Food in the Last Year: Never true  Transportation Needs: No Transportation Needs (10/06/2022)   PRAPARE - Administrator, Civil Service (Medical): No    Lack of Transportation (Non-Medical): No  Physical Activity: Insufficiently Active (05/06/2021)   Exercise Vital Sign    Days of Exercise per Week: 1 day    Minutes of Exercise per Session: 90 min  Stress: No Stress Concern Present (05/06/2021)   [employer] of Occupational Health - Occupational Stress Questionnaire    Feeling of Stress : Not at all  Social Connections: Moderately Isolated (05/06/2021)   Social Connection and Isolation Panel    Frequency of Communication with Friends and Family: More than three times a week    Frequency of Social Gatherings with Friends and Family: More than three times a week    Attends Religious Services: Never    Database administrator or Organizations: Yes    Attends Engineer, structural: More than 4 times per year    Marital Status: Never married  Intimate Partner Violence:  Not At Risk (09/22/2022)   Humiliation, Afraid, Rape, and Kick questionnaire    Fear of Current or Ex-Partner: No    Emotionally Abused: No    Physically Abused: No    Sexually Abused: No    Family History  Problem Relation Age of Onset   Colon cancer Father    Heart disease Father        quad bypass    Current Outpatient Medications on File Prior to Visit  Medication Sig Dispense Refill   carvedilol  (COREG ) 3.125 MG tablet Take 1 tablet (3.125 mg total) by mouth 2 (two) times daily. 180 tablet 1   meclizine  (ANTIVERT ) 50 MG tablet Take 0.5 tablets (25 mg total) by mouth 3 (three) times daily as needed. 90 tablet 0   Misc Natural Products (OSTEO BI-FLEX ADV JOINT SHIELD PO) Take 2 capsules by mouth daily.     Multiple Vitamin (  MULTIVITAMIN) tablet Take 1 tablet by mouth daily.     ondansetron  (ZOFRAN -ODT) 4 MG disintegrating tablet 4mg  ODT q4 hours prn nausea/vomit 10 tablet 0   rivaroxaban  (XARELTO ) 20 MG TABS tablet Take 1 tablet (20 mg total) by mouth daily with supper. 30 tablet 11   tamsulosin  (FLOMAX ) 0.4 MG CAPS capsule TAKE 1 CAPSULE BY MOUTH EVERY DAY 90 capsule 3   traMADol  (ULTRAM ) 50 MG tablet TAKE 1 TABLET BY MOUTH EVERY 8 HOURS. 90 tablet 2   No current facility-administered medications on file prior to visit.    Allergies  Allergen Reactions   Other Other (See Comments)    Mangos - caused a severe rash   Prozac  [Fluoxetine  Hcl]     Made him feel loopy   Sulfa Antibiotics Hives   Vicodin [Hydrocodone -Acetaminophen ] Itching    Tolerates oxycodone    Erythromycin Rash    Many years ago an oral antibiotic caused a rash(wife and patient think it was erythromycin but they are not positive).       Physical Exam Vitals requested from patient and listed below if patient had equipment and was able to obtain at home for this virtual visit: There were no vitals filed for this visit. Estimated body mass index is 23.46 kg/m as calculated from the following:   Height  as of 08/02/23: 6' (1.829 m).   Weight as of 08/02/23: 173 lb (78.5 kg).  EKG (optional): deferred due to virtual visit  GENERAL: alert, oriented, no acute distress detected; full vision exam deferred due to pandemic and/or virtual encounter  PSYCH/NEURO: pleasant and cooperative, no obvious depression or anxiety, speech and thought processing grossly intact, Cognitive function grossly intact  Flowsheet Row Video Visit from 05/10/2022 in [facility]  PHQ-9 Total Score 3        08/10/2023    4:19 PM 07/19/2023    1:50 PM 05/10/2022   12:23 PM 05/10/2022   12:22 PM 05/06/2021   12:38 PM  Depression screen PHQ 2/9  Decreased Interest 0 0 0 0 0  Down, Depressed, Hopeless 0 0 0 0 0  PHQ - 2 Score 0 0 0 0 0  Altered sleeping   0    Tired, decreased energy   3    Change in appetite   0    Feeling bad or failure about yourself    0    Trouble concentrating   0    Moving slowly or fidgety/restless   0    Suicidal thoughts   0    PHQ-9 Score   3    Difficult doing work/chores   Not difficult at all         05/06/2021   12:40 PM 05/19/2021   10:22 AM 10/21/2021    9:34 AM 05/10/2022   12:22 PM 08/10/2023    4:19 PM  Fall Risk  Falls in the past year? 0  0 0 0  Was there an injury with Fall? 0  0 0 0  Fall Risk Category Calculator 0  0 0 0  Fall Risk Category (Retired) Low   Low     (RETIRED) Patient Fall Risk Level Low fall risk  Low fall risk  Low fall risk     Patient at Risk for Falls Due to No Fall Risks  No Fall Risks    Fall risk Follow up   Falls evaluation completed   Falls evaluation completed;Education provided     Data saved with  a previous flowsheet row definition     SUMMARY AND PLAN:  Encounter for Medicare annual wellness exam   Discussed applicable health maintenance/preventive health measures and advised and referred or ordered per patient preferences: -discussed vaccines due/risks and advised can get at the pharmacy. Advise to let us   know if he does so that we can update record.  Health Maintenance  Topic Date Due   DTaP/Tdap/Td (1 - Tdap) Never done   Zoster Vaccines- Shingrix (1 of 2) Never done   COVID-19 Vaccine (3 - Moderna risk series) 02/05/2023   INFLUENZA VACCINE  09/08/2023   Medicare Annual Wellness (AWV)  08/09/2024   Pneumococcal Vaccine: 50+ Years  Completed   Hepatitis B Vaccines  Aged Out   HPV VACCINES  Aged Out   Meningococcal B Vaccine  Aged [employer] and counseling on the following was provided based on the above review of health and a plan/checklist for the patient, along with additional information discussed, was provided for the patient in the patient instructions :   -Advised and counseled on a healthy lifestyle - including the importance of a healthy diet, regular physical activity -Reviewed patient's current diet. Advised and counseled on a whole foods based healthy diet. A summary of a healthy diet was provided in the Patient Instructions. Advised to decrease/eliminate Gatorade and soda/processed drinks.  -reviewed patient's current physical activity level and discussed exercise guidelines for adults. Discussed community resources and ideas for safe exercise at home to assist in meeting exercise guideline recommendations in a safe and healthy way.  Encouraged gentle/ gradual increase in exercise time as able.  -Advise yearly dental visits at minimum and regular eye exams   Follow up: see patient instructions   Patient Instructions  I really enjoyed getting to talk with you today! I am available on Tuesdays and Thursdays for virtual visits if you have any questions or concerns, or if I can be of any further assistance.   CHECKLIST FROM ANNUAL WELLNESS VISIT:  -Follow up (please call to schedule if not scheduled after visit):   -yearly for annual wellness visit with primary care office  Here is a list of your preventive care/health maintenance measures and the plan for each if  any are due:  PLAN For any measures below that may be due:    1. Can get the vaccines at the pharmacy. Please let us  know if you do so that we can update your record. Thank you.   Health Maintenance  Topic Date Due   DTaP/Tdap/Td (1 - Tdap) Never done   Zoster Vaccines- Shingrix (1 of 2) Never done   COVID-19 Vaccine (3 - Moderna risk series) 02/05/2023   Medicare Annual Wellness (AWV)  05/10/2023   INFLUENZA VACCINE  09/08/2023   Pneumococcal Vaccine: 50+ Years  Completed   Hepatitis B Vaccines  Aged Out   HPV VACCINES  Aged Out   Meningococcal B Vaccine  Aged Out    -See a dentist at least yearly  -Get your eyes checked and then per your eye specialist's recommendations  -Other issues addressed today:   -I have included below further information regarding a healthy whole foods based diet, physical activity guidelines for adults, stress management and opportunities for social connections. I hope you find this information useful.   -----------------------------------------------------------------------------------------------------------------------------------------------------------------------------------------------------------------------------------------------------------    NUTRITION: -eat real food: lots of colorful vegetables (half the plate) and fruits -5-7 servings of vegetables and fruits per day (fresh or steamed is best), exp.  2 servings of vegetables with lunch and dinner and 2 servings of fruit per day. Berries and greens such as kale and collards are great choices.  -consume on a regular basis:  fresh fruits, fresh veggies, fish, nuts, seeds, healthy oils (such as olive oil, avocado oil), whole grains (make sure for bread/pasta/crackers/etc., that the first ingredient on label contains the word whole), legumes. -can eat small amounts of dairy and lean meat (no larger than the palm of your hand), but avoid processed meats such as ham, bacon, lunch meat,  etc. -drink water -try to avoid fast food and pre-packaged foods, processed meat, ultra processed foods/beverages (donuts, candy, etc.) -most experts advise limiting sodium to < 2300mg  per day, should limit further is any chronic conditions such as high blood pressure, heart disease, diabetes, etc. The American Heart Association advised that < 1500mg  is is ideal -try to avoid foods/beverages that contain any ingredients with names you do not recognize  -try to avoid foods/beverages  with added sugar or sweeteners/sweets  -try to avoid sweet drinks (including diet drinks): soda, juice, Gatorade, sweet tea, power drinks, diet drinks -try to avoid white rice, white bread, pasta (unless whole grain)  EXERCISE GUIDELINES FOR ADULTS: -if you wish to increase your physical activity, do so gradually and with the approval of your doctor -STOP and seek medical care immediately if you have any chest pain, chest discomfort or trouble breathing when starting or increasing exercise  -move and stretch your body, legs, feet and arms when sitting for long periods -Physical activity guidelines for optimal health in adults: -get at least 150 minutes per week of moderate exercise (can talk, but not sing); this is about 20-30 minutes of sustained activity 5-7 days per week or two 10-15 minute episodes of sustained activity 5-7 days per week -do some muscle building/resistance training/strength training at least 2 days per week  -balance exercises 3+ days per week:   Stand somewhere where you have something sturdy to hold onto if you lose balance    1) lift up on toes, then back down, start with 5x per day and work up to 20x   2) stand and lift one leg straight out to the side so that foot is a few inches of the floor, start with 5x each side and work up to 20x each side   3) stand on one foot, start with 5 seconds each side and work up to 20 seconds on each side  If you need ideas or help with getting more  active:  -Silver sneakers https://tools.silversneakers.com  -Walk with a Doc: http://www.duncan-williams.com/  -try to include resistance (weight lifting/strength building) and balance exercises twice per week: or the following link for ideas: http://castillo-powell.com/  BuyDucts.dk  STRESS MANAGEMENT: -can try meditating, or just sitting quietly with deep breathing while intentionally relaxing all parts of your body for 5 minutes daily -if you need further help with stress, anxiety or depression please follow up with your primary doctor or contact the wonderful folks at [facility]: [phone]  SOCIAL CONNECTIONS: -options in [place] if you wish to engage in more social and exercise related activities:  -Silver sneakers https://tools.silversneakers.com  -Walk with a Doc: http://www.duncan-williams.com/  -Check out the [facility] Active Adults 50+ section on the [place] of [employer] (hiking clubs, book clubs, cards and games, chess, exercise classes, aquatic classes and much more) - see the website for details: https://www.-[place].gov/departments/parks-recreation/active-adults50  -YouTube has lots of exercise videos for different ages and abilities as well  -  [facility] (a variety of indoor and outdoor inperson activities for adults). [phone]. [address].  -Virtual Online Classes (a variety of topics): see seniorplanet.org or call [phone]  -consider volunteering at a school, hospice center, church, senior center or elsewhere            Chiquita JONELLE Cramp, DO

## 2023-08-10 NOTE — Patient Instructions (Signed)
 I really enjoyed getting to talk with you today! I am available on Tuesdays and Thursdays for virtual visits if you have any questions or concerns, or if I can be of any further assistance.   CHECKLIST FROM ANNUAL WELLNESS VISIT:  -Follow up (please call to schedule if not scheduled after visit):   -yearly for annual wellness visit with primary care office  Here is a list of your preventive care/health maintenance measures and the plan for each if any are due:  PLAN For any measures below that may be due:    1. Can get the vaccines at the pharmacy. Please let us  know if you do so that we can update your record. Thank you.   Health Maintenance  Topic Date Due   DTaP/Tdap/Td (1 - Tdap) Never done   Zoster Vaccines- Shingrix (1 of 2) Never done   COVID-19 Vaccine (3 - Moderna risk series) 02/05/2023   Medicare Annual Wellness (AWV)  05/10/2023   INFLUENZA VACCINE  09/08/2023   Pneumococcal Vaccine: 50+ Years  Completed   Hepatitis B Vaccines  Aged Out   HPV VACCINES  Aged Out   Meningococcal B Vaccine  Aged Out    -See a dentist at least yearly  -Get your eyes checked and then per your eye specialist's recommendations  -Other issues addressed today:   -I have included below further information regarding a healthy whole foods based diet, physical activity guidelines for adults, stress management and opportunities for social connections. I hope you find this information useful.   -----------------------------------------------------------------------------------------------------------------------------------------------------------------------------------------------------------------------------------------------------------    NUTRITION: -eat real food: lots of colorful vegetables (half the plate) and fruits -5-7 servings of vegetables and fruits per day (fresh or steamed is best), exp. 2 servings of vegetables with lunch and dinner and 2 servings of fruit per day. Berries and  greens such as kale and collards are great choices.  -consume on a regular basis:  fresh fruits, fresh veggies, fish, nuts, seeds, healthy oils (such as olive oil, avocado oil), whole grains (make sure for bread/pasta/crackers/etc., that the first ingredient on label contains the word whole), legumes. -can eat small amounts of dairy and lean meat (no larger than the palm of your hand), but avoid processed meats such as ham, bacon, lunch meat, etc. -drink water -try to avoid fast food and pre-packaged foods, processed meat, ultra processed foods/beverages (donuts, candy, etc.) -most experts advise limiting sodium to < 2300mg  per day, should limit further is any chronic conditions such as high blood pressure, heart disease, diabetes, etc. The American Heart Association advised that < 1500mg  is is ideal -try to avoid foods/beverages that contain any ingredients with names you do not recognize  -try to avoid foods/beverages  with added sugar or sweeteners/sweets  -try to avoid sweet drinks (including diet drinks): soda, juice, Gatorade, sweet tea, power drinks, diet drinks -try to avoid white rice, white bread, pasta (unless whole grain)  EXERCISE GUIDELINES FOR ADULTS: -if you wish to increase your physical activity, do so gradually and with the approval of your doctor -STOP and seek medical care immediately if you have any chest pain, chest discomfort or trouble breathing when starting or increasing exercise  -move and stretch your body, legs, feet and arms when sitting for long periods -Physical activity guidelines for optimal health in adults: -get at least 150 minutes per week of moderate exercise (can talk, but not sing); this is about 20-30 minutes of sustained activity 5-7 days per week or two 10-15 minute episodes of  sustained activity 5-7 days per week -do some muscle building/resistance training/strength training at least 2 days per week  -balance exercises 3+ days per week:   Stand  somewhere where you have something sturdy to hold onto if you lose balance    1) lift up on toes, then back down, start with 5x per day and work up to 20x   2) stand and lift one leg straight out to the side so that foot is a few inches of the floor, start with 5x each side and work up to 20x each side   3) stand on one foot, start with 5 seconds each side and work up to 20 seconds on each side  If you need ideas or help with getting more active:  -Silver sneakers https://tools.silversneakers.com  -Walk with a Doc: http://www.duncan-williams.com/  -try to include resistance (weight lifting/strength building) and balance exercises twice per week: or the following link for ideas: http://castillo-powell.com/  BuyDucts.dk  STRESS MANAGEMENT: -can try meditating, or just sitting quietly with deep breathing while intentionally relaxing all parts of your body for 5 minutes daily -if you need further help with stress, anxiety or depression please follow up with your primary doctor or contact the wonderful folks at [facility]: [phone]  SOCIAL CONNECTIONS: -options in [place] if you wish to engage in more social and exercise related activities:  -Silver sneakers https://tools.silversneakers.com  -Walk with a Doc: http://www.duncan-williams.com/  -Check out the [facility] Active Adults 50+ section on the [place] [employer] (hiking clubs, book clubs, cards and games, chess, exercise classes, aquatic classes and much more) - see the website for details: https://www.[place].gov/departments/parks-recreation/active-adults50  -YouTube has lots of exercise videos for different ages and abilities as well  -[facility] (a variety of indoor and outdoor inperson activities for adults). [phone]. [address]  -Virtual Online Classes (a variety of topics): see  seniorplanet.org or call [phone]  -consider volunteering at a school, hospice center, church, senior center or elsewhere

## 2023-08-15 ENCOUNTER — Observation Stay (HOSPITAL_COMMUNITY)
Admission: EM | Admit: 2023-08-15 | Discharge: 2023-08-16 | Disposition: A | Discharge status: Home / Self Care | Attending: Family Medicine | Admitting: Family Medicine

## 2023-08-15 ENCOUNTER — Emergency Department (HOSPITAL_COMMUNITY)

## 2023-08-15 ENCOUNTER — Encounter (HOSPITAL_COMMUNITY): Payer: Self-pay | Admitting: Emergency Medicine

## 2023-08-15 DIAGNOSIS — E785 Hyperlipidemia, unspecified: Secondary | ICD-10-CM | POA: Insufficient documentation

## 2023-08-15 DIAGNOSIS — I480 Paroxysmal atrial fibrillation: Secondary | ICD-10-CM | POA: Diagnosis not present

## 2023-08-15 DIAGNOSIS — Z85828 Personal history of other malignant neoplasm of skin: Secondary | ICD-10-CM | POA: Insufficient documentation

## 2023-08-15 DIAGNOSIS — H9319 Tinnitus, unspecified ear: Secondary | ICD-10-CM | POA: Insufficient documentation

## 2023-08-15 DIAGNOSIS — H8113 Benign paroxysmal vertigo, bilateral: Secondary | ICD-10-CM

## 2023-08-15 DIAGNOSIS — K4090 Unilateral inguinal hernia, without obstruction or gangrene, not specified as recurrent: Secondary | ICD-10-CM | POA: Insufficient documentation

## 2023-08-15 DIAGNOSIS — R110 Nausea: Secondary | ICD-10-CM | POA: Diagnosis not present

## 2023-08-15 DIAGNOSIS — N200 Calculus of kidney: Secondary | ICD-10-CM | POA: Diagnosis not present

## 2023-08-15 DIAGNOSIS — N400 Enlarged prostate without lower urinary tract symptoms: Secondary | ICD-10-CM | POA: Insufficient documentation

## 2023-08-15 DIAGNOSIS — Z7901 Long term (current) use of anticoagulants: Secondary | ICD-10-CM | POA: Insufficient documentation

## 2023-08-15 DIAGNOSIS — R55 Syncope and collapse: Secondary | ICD-10-CM | POA: Insufficient documentation

## 2023-08-15 DIAGNOSIS — I272 Pulmonary hypertension, unspecified: Secondary | ICD-10-CM | POA: Insufficient documentation

## 2023-08-15 DIAGNOSIS — I10 Essential (primary) hypertension: Secondary | ICD-10-CM | POA: Insufficient documentation

## 2023-08-15 DIAGNOSIS — R1110 Vomiting, unspecified: Secondary | ICD-10-CM | POA: Diagnosis not present

## 2023-08-15 DIAGNOSIS — R0789 Other chest pain: Secondary | ICD-10-CM | POA: Insufficient documentation

## 2023-08-15 DIAGNOSIS — R42 Dizziness and giddiness: Principal | ICD-10-CM | POA: Insufficient documentation

## 2023-08-15 DIAGNOSIS — Z86718 Personal history of other venous thrombosis and embolism: Secondary | ICD-10-CM | POA: Diagnosis not present

## 2023-08-15 DIAGNOSIS — R001 Bradycardia, unspecified: Secondary | ICD-10-CM | POA: Diagnosis not present

## 2023-08-15 DIAGNOSIS — R231 Pallor: Secondary | ICD-10-CM | POA: Diagnosis not present

## 2023-08-15 DIAGNOSIS — R112 Nausea with vomiting, unspecified: Principal | ICD-10-CM

## 2023-08-15 DIAGNOSIS — I499 Cardiac arrhythmia, unspecified: Secondary | ICD-10-CM | POA: Diagnosis not present

## 2023-08-15 DIAGNOSIS — G8929 Other chronic pain: Secondary | ICD-10-CM

## 2023-08-15 DIAGNOSIS — N179 Acute kidney failure, unspecified: Secondary | ICD-10-CM | POA: Diagnosis not present

## 2023-08-15 DIAGNOSIS — Z86711 Personal history of pulmonary embolism: Secondary | ICD-10-CM | POA: Insufficient documentation

## 2023-08-15 DIAGNOSIS — K439 Ventral hernia without obstruction or gangrene: Secondary | ICD-10-CM | POA: Diagnosis not present

## 2023-08-15 LAB — CBC WITH DIFFERENTIAL/PLATELET
Abs Immature Granulocytes: 0.02 K/uL (ref 0.00–0.07)
Basophils Absolute: 0 K/uL (ref 0.0–0.1)
Basophils Relative: 0 %
Eosinophils Absolute: 0 K/uL (ref 0.0–0.5)
Eosinophils Relative: 0 %
HCT: 45.5 % (ref 39.0–52.0)
Hemoglobin: 15.5 g/dL (ref 13.0–17.0)
Immature Granulocytes: 0 %
Lymphocytes Relative: 13 %
Lymphs Abs: 1.3 K/uL (ref 0.7–4.0)
MCH: 33.6 pg (ref 26.0–34.0)
MCHC: 34.1 g/dL (ref 30.0–36.0)
MCV: 98.7 fL (ref 80.0–100.0)
Monocytes Absolute: 1 K/uL (ref 0.1–1.0)
Monocytes Relative: 9 %
Neutro Abs: 8 K/uL — ABNORMAL HIGH (ref 1.7–7.7)
Neutrophils Relative %: 78 %
Platelets: 264 K/uL (ref 150–400)
RBC: 4.61 MIL/uL (ref 4.22–5.81)
RDW: 13.3 % (ref 11.5–15.5)
WBC: 10.4 K/uL (ref 4.0–10.5)
nRBC: 0 % (ref 0.0–0.2)

## 2023-08-15 LAB — COMPREHENSIVE METABOLIC PANEL WITH GFR
ALT: 16 U/L (ref 0–44)
AST: 27 U/L (ref 15–41)
Albumin: 4.2 g/dL (ref 3.5–5.0)
Alkaline Phosphatase: 85 U/L (ref 38–126)
Anion gap: 9 (ref 5–15)
BUN: 19 mg/dL (ref 8–23)
CO2: 26 mmol/L (ref 22–32)
Calcium: 9.5 mg/dL (ref 8.9–10.3)
Chloride: 102 mmol/L (ref 98–111)
Creatinine, Ser: 1.4 mg/dL — ABNORMAL HIGH (ref 0.61–1.24)
GFR, Estimated: 50 mL/min — ABNORMAL LOW (ref >60)
Glucose, Bld: 133 mg/dL — ABNORMAL HIGH (ref 70–99)
Potassium: 4.4 mmol/L (ref 3.5–5.1)
Sodium: 137 mmol/L (ref 135–145)
Total Bilirubin: 1 mg/dL (ref 0.0–1.2)
Total Protein: 7 g/dL (ref 6.5–8.1)

## 2023-08-15 LAB — TROPONIN I (HIGH SENSITIVITY): Troponin I (High Sensitivity): 7 ng/L (ref <18)

## 2023-08-15 LAB — LIPASE, BLOOD: Lipase: 33 U/L (ref 11–51)

## 2023-08-15 MED ORDER — LACTATED RINGERS IV BOLUS
1000.0000 mL | Freq: Once | INTRAVENOUS | Status: AC
  Administered 2023-08-16: 1000 mL via INTRAVENOUS

## 2023-08-15 MED ORDER — ONDANSETRON HCL 4 MG/2ML IJ SOLN
4.0000 mg | Freq: Once | INTRAMUSCULAR | Status: AC
  Administered 2023-08-15: 4 mg via INTRAVENOUS
  Filled 2023-08-15: qty 2

## 2023-08-15 MED ORDER — SODIUM CHLORIDE 0.9 % IV BOLUS
1000.0000 mL | Freq: Once | INTRAVENOUS | Status: AC
  Administered 2023-08-15: 1000 mL via INTRAVENOUS

## 2023-08-15 MED ORDER — IOHEXOL 300 MG/ML  SOLN
100.0000 mL | Freq: Once | INTRAMUSCULAR | Status: AC | PRN
  Administered 2023-08-15: 100 mL via INTRAVENOUS

## 2023-08-15 MED ORDER — MORPHINE SULFATE (PF) 4 MG/ML IV SOLN
4.0000 mg | Freq: Once | INTRAVENOUS | Status: AC
  Administered 2023-08-15: 4 mg via INTRAVENOUS
  Filled 2023-08-15: qty 1

## 2023-08-15 NOTE — ED Triage Notes (Signed)
 Pt arriving from [employer]. During second half of game, pt became clammy and pale and had episodes of vomiting. Medical staff on site placed 18g IV in right wrist and gave 4mg  Zofran .

## 2023-08-16 ENCOUNTER — Emergency Department (HOSPITAL_COMMUNITY)

## 2023-08-16 ENCOUNTER — Encounter (HOSPITAL_COMMUNITY): Payer: Self-pay | Admitting: Internal Medicine

## 2023-08-16 DIAGNOSIS — I10 Essential (primary) hypertension: Secondary | ICD-10-CM | POA: Diagnosis not present

## 2023-08-16 DIAGNOSIS — Z86711 Personal history of pulmonary embolism: Secondary | ICD-10-CM

## 2023-08-16 DIAGNOSIS — E7849 Other hyperlipidemia: Secondary | ICD-10-CM

## 2023-08-16 DIAGNOSIS — R55 Syncope and collapse: Secondary | ICD-10-CM

## 2023-08-16 DIAGNOSIS — N179 Acute kidney failure, unspecified: Secondary | ICD-10-CM

## 2023-08-16 DIAGNOSIS — K4090 Unilateral inguinal hernia, without obstruction or gangrene, not specified as recurrent: Secondary | ICD-10-CM

## 2023-08-16 DIAGNOSIS — I480 Paroxysmal atrial fibrillation: Secondary | ICD-10-CM

## 2023-08-16 DIAGNOSIS — R42 Dizziness and giddiness: Secondary | ICD-10-CM

## 2023-08-16 DIAGNOSIS — Z86718 Personal history of other venous thrombosis and embolism: Secondary | ICD-10-CM | POA: Diagnosis not present

## 2023-08-16 DIAGNOSIS — I272 Pulmonary hypertension, unspecified: Secondary | ICD-10-CM | POA: Insufficient documentation

## 2023-08-16 DIAGNOSIS — H8113 Benign paroxysmal vertigo, bilateral: Secondary | ICD-10-CM

## 2023-08-16 DIAGNOSIS — G8929 Other chronic pain: Secondary | ICD-10-CM | POA: Diagnosis not present

## 2023-08-16 DIAGNOSIS — E785 Hyperlipidemia, unspecified: Secondary | ICD-10-CM | POA: Insufficient documentation

## 2023-08-16 DIAGNOSIS — R079 Chest pain, unspecified: Secondary | ICD-10-CM | POA: Diagnosis not present

## 2023-08-16 DIAGNOSIS — R1110 Vomiting, unspecified: Secondary | ICD-10-CM | POA: Diagnosis not present

## 2023-08-16 DIAGNOSIS — K439 Ventral hernia without obstruction or gangrene: Secondary | ICD-10-CM | POA: Diagnosis not present

## 2023-08-16 DIAGNOSIS — N200 Calculus of kidney: Secondary | ICD-10-CM | POA: Diagnosis not present

## 2023-08-16 LAB — CBC
HCT: 40.8 % (ref 39.0–52.0)
Hemoglobin: 13.6 g/dL (ref 13.0–17.0)
MCH: 33.4 pg (ref 26.0–34.0)
MCHC: 33.3 g/dL (ref 30.0–36.0)
MCV: 100.2 fL — ABNORMAL HIGH (ref 80.0–100.0)
Platelets: 232 10*3/uL (ref 150–400)
RBC: 4.07 MIL/uL — ABNORMAL LOW (ref 4.22–5.81)
RDW: 13.4 % (ref 11.5–15.5)
WBC: 7.4 10*3/uL (ref 4.0–10.5)
nRBC: 0 % (ref 0.0–0.2)

## 2023-08-16 LAB — BASIC METABOLIC PANEL WITH GFR
Anion gap: 8 (ref 5–15)
BUN: 17 mg/dL (ref 8–23)
CO2: 25 mmol/L (ref 22–32)
Calcium: 8.9 mg/dL (ref 8.9–10.3)
Chloride: 105 mmol/L (ref 98–111)
Creatinine, Ser: 0.81 mg/dL (ref 0.61–1.24)
GFR, Estimated: >60 mL/min
Glucose, Bld: 100 mg/dL — ABNORMAL HIGH (ref 70–99)
Potassium: 4.4 mmol/L (ref 3.5–5.1)
Sodium: 138 mmol/L (ref 135–145)

## 2023-08-16 LAB — TROPONIN I (HIGH SENSITIVITY): Troponin I (High Sensitivity): 7 ng/L (ref <18)

## 2023-08-16 MED ORDER — SODIUM CHLORIDE 0.9% FLUSH: 3.0000 mL | INTRAVENOUS | Status: DC | PRN

## 2023-08-16 MED ORDER — RIVAROXABAN 20 MG PO TABS: 20.0000 mg | ORAL_TABLET | Freq: Every day | ORAL | Status: DC

## 2023-08-16 MED ORDER — SODIUM CHLORIDE 0.9 % IV SOLN: 250.0000 mL | INTRAVENOUS | Status: DC | PRN

## 2023-08-16 MED ORDER — ONDANSETRON HCL 4 MG/2ML IJ SOLN: 4.0000 mg | Freq: Four times a day (QID) | INTRAMUSCULAR | Status: DC | PRN

## 2023-08-16 MED ORDER — CARVEDILOL 3.125 MG PO TABS
3.1250 mg | ORAL_TABLET | Freq: Two times a day (BID) | ORAL | Status: DC
  Administered 2023-08-16 (×2): 3.125 mg via ORAL
  Filled 2023-08-16 (×2): qty 1

## 2023-08-16 MED ORDER — SODIUM CHLORIDE 0.9% FLUSH
3.0000 mL | Freq: Two times a day (BID) | INTRAVENOUS | Status: DC
  Administered 2023-08-16 (×2): 3 mL via INTRAVENOUS

## 2023-08-16 MED ORDER — SODIUM CHLORIDE 0.9 % IV SOLN: INTRAVENOUS | Status: DC

## 2023-08-16 MED ORDER — ONDANSETRON HCL 4 MG PO TABS: 4.0000 mg | ORAL_TABLET | Freq: Four times a day (QID) | ORAL | Status: DC | PRN

## 2023-08-16 MED ORDER — LACTATED RINGERS IV SOLN: INTRAVENOUS | Status: DC

## 2023-08-16 MED ORDER — MECLIZINE HCL 25 MG PO TABS: 25.0000 mg | ORAL_TABLET | Freq: Three times a day (TID) | ORAL | Status: DC | PRN

## 2023-08-16 MED ORDER — TAMSULOSIN HCL 0.4 MG PO CAPS
0.4000 mg | ORAL_CAPSULE | Freq: Every day | ORAL | Status: DC
  Administered 2023-08-16: 0.4 mg via ORAL
  Filled 2023-08-16: qty 1

## 2023-08-16 NOTE — ED Provider Notes (Signed)
 [place] EMERGENCY DEPARTMENT AT [facility] Provider Note  CSN: [id number] Arrival date & time: 08/15/23 2120  Chief Complaint(s) Weakness and Emesis  HPI [name] is a 83 y.o. male who is here today from a [employer] game after the patient started to feel unwell and have some episodes of vomiting.  Patient reports that he was out for his birthday, only drank a very small amount of alcohol, felt a little nauseated, had multiple episodes of vomiting.  Patient with a history of inguinal and umbilical hernia.   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   Colonic mass 2001   Fatigue    x 1 week o as of 02-17-2020   Fibromyalgia 1990's    Frequent PVCs    Generalized body aches    x 1 week as of 02-17-2020   H/O inguinal hernia    right side   Hernia, umbilical    History of kidney stones    HX OF MULTIPLE KIDNEY STONES AND  CURRENTLY HAS BILATERAL RENAL STONES CAUSING ABDOMINAL AND BACK PAIN   Hypertension    high blood pressure readings    NSVT (nonsustained ventricular tachycardia) (HCC)    Pneumonia    Premature atrial contractions    Wears dentures    full dentures   Patient Active Problem List   Diagnosis Date Noted   Pain in right knee 12/26/2022   Healthcare-associated pneumonia 10/02/2022   Pulmonary emboli (HCC) 09/21/2022   Acute hypoxemic respiratory failure (HCC) 09/21/2022   AKI (acute kidney injury) (HCC) 09/21/2022   Melanoma of skin (HCC) 08/26/2022   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 Authorizing Provider  carvedilol  (COREG ) 3.125 MG tablet Take 1 tablet (3.125 mg total) by mouth 2 (two) times daily. 10/08/22   Odell Celinda Balo, MD  meclizine  (ANTIVERT ) 50 MG tablet Take 0.5 tablets (25 mg total) by mouth 3 (three) times daily as needed. 07/19/23 08/18/23  Nafziger, Darleene, NP  Misc Natural Products (OSTEO BI-FLEX ADV JOINT SHIELD PO) Take 2 capsules by mouth daily.    [provider]  Multiple Vitamin (MULTIVITAMIN) tablet Take 1 tablet by mouth daily.    [provider]  ondansetron  (ZOFRAN -ODT) 4 MG disintegrating tablet 4mg  ODT q4 hours prn nausea/vomit 04/21/23   Patt Alm Macho, MD  rivaroxaban  (XARELTO ) 20 MG TABS tablet Take 1 tablet (20 mg total) by mouth daily with supper. 11/25/22   Nafziger, Darleene, NP  tamsulosin  (FLOMAX ) 0.4 MG CAPS capsule TAKE 1 CAPSULE BY MOUTH EVERY DAY 02/15/23   Nafziger, Darleene, NP  traMADol  (ULTRAM ) 50 MG tablet TAKE 1 TABLET BY MOUTH EVERY 8 HOURS. 05/16/23   Merna Darleene, NP  Past Surgical History Past Surgical History:  Procedure Laterality Date   BRAIN TUMOR EXCISION  age 52   CARDIAC CATHETERIZATION N/A 04/09/2015   Procedure: Left Heart Cath and Coronary Angiography;  Surgeon: Lonni JONETTA Cash, MD;  Location: [facility] INVASIVE CV LAB;  Service: Cardiovascular;  Laterality: N/A;   COLON SURGERY  2001   COLON RESECTION FOR GROWTH - NOT CANCER   CYSTOSCOPY WITH RETROGRADE PYELOGRAM, URETEROSCOPY AND STENT PLACEMENT Left 10/05/2012   Procedure: CYSTOSCOPY WITH RETROGRADE PYELOGRAM, URETEROSCOPY AND STENT PLACEMENT;  Surgeon: Ricardo Likens, MD;  Location: [facility];  Service: Urology;  Laterality: Left;   CYSTOSCOPY/RETROGRADE/URETEROSCOPY/STONE  EXTRACTION WITH BASKET Right 10/05/2012   Procedure: CYSTOSCOPY/RETROGRADE/URETEROSCOPY/STONE EXTRACTION WITH BASKET;  Surgeon: Ricardo Likens, MD;  Location: [facility];  Service: Urology;  Laterality: Right;   CYSTOSCOPY/RETROGRADE/URETEROSCOPY/STONE EXTRACTION WITH BASKET Bilateral 10/24/2012   Procedure: CYSTOSCOPY/RETROGRADE/URETEROSCOPY/STONE EXTRACTION WITH BASKET;  Surgeon: Ricardo Likens, MD;  Location: [facility];  Service: Urology;  Laterality: Bilateral;  with bilateral stent placement   CYSTOSCOPY/URETEROSCOPY/HOLMIUM LASER/STENT PLACEMENT Left 05/02/2023   Procedure: CYSTOSCOPY/URETEROSCOPY/HOLMIUM LASER/STENT PLACEMENT;  Surgeon: Shane Steffan BROCKS, MD;  Location: [facility];  Service: Urology;  Laterality: Left;   HOLMIUM LASER APPLICATION Right 10/05/2012   Procedure: HOLMIUM LASER APPLICATION;  Surgeon: Ricardo Likens, MD;  Location: [facility];  Service: Urology;  Laterality: Right;   HOLMIUM LASER APPLICATION Bilateral 10/24/2012   Procedure: HOLMIUM LASER APPLICATION;  Surgeon: Ricardo Likens, MD;  Location: [facility];  Service: Urology;  Laterality: Bilateral;   IR ANGIOGRAM PULMONARY BILATERAL SELECTIVE  09/21/2022   IR INFUSION THROMBOL ARTERIAL INITIAL (MS)  09/21/2022   IR INFUSION THROMBOL ARTERIAL INITIAL (MS)  09/21/2022   IR IVC FILTER PLMT / S&I PORTER GUID/MOD SED  09/25/2022   IR THROMB F/U EVAL ART/VEN FINAL DAY (MS)  09/22/2022   IR US  GUIDE VASC ACCESS RIGHT  09/21/2022   LITHOTRIPSY     TONSILLECTOMY  age 47's   and adenoids   Family History Family History  Problem Relation Age of Onset   Colon cancer Father    Heart disease Father        quad bypass    Social History Social History   Tobacco Use   Smoking status: Never   Smokeless tobacco: Never  Vaping Use   Vaping status: Never Used  Substance Use Topics   Alcohol use: Not Currently    Alcohol/week: 7.0 standard drinks of alcohol    Types: 7 Standard drinks or equivalent per week   Drug use: Not Currently    Allergies Other, Prozac  [fluoxetine  hcl], Sulfa antibiotics, Vicodin [hydrocodone -acetaminophen ], and Erythromycin  Review of Systems Review of Systems  Physical Exam Vital Signs  I have reviewed the triage vital signs BP 122/85 (BP Location: Right Arm)   Pulse 64   Temp 97.9 F (36.6 C)   Resp (!) 23   SpO2 97%   Physical Exam Vitals and nursing note reviewed.  Constitutional:      Appearance: Normal appearance.  Eyes:     Pupils: Pupils are equal, round, and reactive to light.  Cardiovascular:     Rate and Rhythm: Normal rate.  Pulmonary:     Effort: Pulmonary effort is normal.  Abdominal:     General: Abdomen is flat.     Palpations: Abdomen is soft.     Hernia: A hernia is present.     Comments: Soft: Inguinal hernia on the right.  No dusky skin.  Musculoskeletal:        General:  Normal range of motion.     Cervical back: Normal range of motion.  Skin:    General: Skin is warm.  Neurological:     General: No focal deficit present.     Mental Status: He is alert.     ED Results and Treatments Labs (all labs ordered are listed, but only abnormal results are displayed) Labs Reviewed  COMPREHENSIVE METABOLIC PANEL WITH GFR - Abnormal; Notable for the following components:      Result Value   Glucose, Bld 133 (*)    Creatinine, Ser 1.40 (*)    GFR, Estimated 50 (*)    All other components within normal limits  CBC WITH DIFFERENTIAL/PLATELET - Abnormal; Notable for the following components:   Neutro Abs 8.0 (*)    All other components within normal limits  LIPASE, BLOOD  TROPONIN I (HIGH SENSITIVITY)  TROPONIN I (HIGH SENSITIVITY)                                                                                                                          Radiology No results found.  Pertinent labs & imaging results that were available during my care of the patient were reviewed by me and considered in my medical decision making (see MDM for  details).  Medications Ordered in ED Medications  lactated ringers  bolus 1,000 mL (has no administration in time range)  ondansetron  (ZOFRAN ) injection 4 mg (4 mg Intravenous Given 08/15/23 2229)  morphine  (PF) 4 MG/ML injection 4 mg (4 mg Intravenous Given 08/15/23 2230)  sodium chloride  0.9 % bolus 1,000 mL (1,000 mLs Intravenous Bolus 08/15/23 2238)  iohexol  (OMNIPAQUE ) 300 MG/ML solution 100 mL (100 mLs Intravenous Contrast Given 08/15/23 2356)                                                                                                                                     Procedures Procedures  (including critical care time)  Medical Decision Making / ED Course   This patient presents to the ED for concern of vomiting, this involves an extensive number of treatment options, and is a complaint that carries with it a high risk of complications and morbidity.  The differential diagnosis includes bowel obstruction, enteritis, gastritis, ACS.  MDM: Patient presenting with vomiting nausea.  Feels better after Zofran .  Abdomen is soft, hernia is also soft.  Will check labs, obtain imaging of  the patient's abdomen pelvis.  Symptoms may be atypical ACS.  Troponin ordered.  Initial EKG nonischemic.   Additional history obtained: -Additional history obtained from EMS -External records from outside source obtained and reviewed including: Chart review including previous notes, labs, imaging, consultation notes   Lab Tests: -I ordered, reviewed, and interpreted labs.   The pertinent results include:   Labs Reviewed  COMPREHENSIVE METABOLIC PANEL WITH GFR - Abnormal; Notable for the following components:      Result Value   Glucose, Bld 133 (*)    Creatinine, Ser 1.40 (*)    GFR, Estimated 50 (*)    All other components within normal limits  CBC WITH DIFFERENTIAL/PLATELET - Abnormal; Notable for the following components:   Neutro Abs 8.0 (*)    All other components within normal limits   LIPASE, BLOOD  TROPONIN I (HIGH SENSITIVITY)  TROPONIN I (HIGH SENSITIVITY)      EKG normal sinus rhythm, no ST segment depression or elevation, no T wave inversions.  No acute ischemia.  EKG Interpretation Date/Time:  Tuesday August 15 2023 23:52:53 EDT Ventricular Rate:  59 PR Interval:  205 QRS Duration:  98 QT Interval:  444 QTC Calculation: 440 R Axis:   -4  Text Interpretation: Sinus rhythm Borderline T abnormalities, inferior leads Confirmed by Griselda Norris [phone]) on 08/16/2023 12:01:35 AM         Imaging Studies ordered: I ordered imaging studies including CT abdomen pelvis I independently visualized and interpreted imaging. I agree with the radiologist interpretation   Medicines ordered and prescription drug management: Meds ordered this encounter  Medications   ondansetron  (ZOFRAN ) injection 4 mg   morphine  (PF) 4 MG/ML injection 4 mg   sodium chloride  0.9 % bolus 1,000 mL   lactated ringers  bolus 1,000 mL   iohexol  (OMNIPAQUE ) 300 MG/ML solution 100 mL    -I have reviewed the patients home medicines and have made adjustments as needed  Cardiac Monitoring: The patient was maintained on a cardiac monitor.  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 Griselda pending CT imaging, labs and disposition.     Final Clinical Impression(s) / ED Diagnoses Final diagnoses:  Nausea and vomiting, unspecified vomiting  type     @PCDICTATION @    Mannie Pac T, DO 08/16/23 0010

## 2023-08-16 NOTE — Discharge Summary (Signed)
 Physician Discharge Summary  RAS KOLLMAN FMW:[id number] DOB: 1940/12/19 DOA: 08/15/2023  PCP: Merna Huxley, NP  Admit date: 08/15/2023 Discharge date: 08/16/2023    Admitted From: Home Disposition: Home  Recommendations for Outpatient Follow-up:  Follow up with PCP in 1-2 weeks Please obtain BMP/CBC in one week Please follow up with your PCP on the following pending results: Unresulted Labs (From admission, onward)    None         Home Health: None Equipment/Devices: None  Discharge Condition: Stable CODE STATUS: Full code Diet recommendation: Cardiac  Due to brief hospitalization, I have copied ED triage note, HPI and ED course from admitting hospitalist HPI as below.  ED TRIAGE note:  Pt arriving from [employer]. During second half of game, pt became clammy and pale and had episodes of vomiting. Medical staff on site placed 18g IV in right wrist and gave 4mg  Zofran .               HPI:  [name] is a 83 y.o. 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 Present emergency department with complaining of feeling unwell, diaphoretic, presyncope and 1 episode of vomiting.  Patient did not lost consciousness however felt like he is going to pass out. In the ED patient is complaining about left-sided chest pain.  Further history from the patient revealed that he has chronic left-sided chest pain since he was diagnosed with PE in 2024.  Patient reported that he was out for his birthday while he drank a very small amount of alcohol.  He was playing [employer] game suddenly feeling nauseated and has multiple episodes of vomiting. Patient reported that he has been scheduled for hernia repair but it has been delayed because he recently diagnosed with COVID.   Patient also reported he has ringing sensation of his ear for a  long time and he feels dizzy and nauseated when he turns a to left or right side.  Never has been previously evaluated by ENT or vestibular therapist outpatient.  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   Patient denies any shortness of breath, palpitation, abdominal pain, vomiting and diarrhea.   Of note, patient reported that when in the ED he received second bolus of LR suddenly started tremor of his hands and once the fluid has been stopped the tremor resolved.   ED Course:  At presentation to ED patient is hemodynamically stable. Troponin x 2 within normal range. Normal lipase level.  CMP showed elevated creatinine 1.4 otherwise unremarkable.  CBC unremarkable. ED showed normal sinus rhythm heart rate 59, borderline T abnormality normal QTc.   CT abdomen pelvis showed: Multiple ventral hernias containing fat. Right inguinal hernia containing small bowel loops. No evidence of bowel obstruction.  Bilateral nephrolithiasis.  No ureteral stones or hydronephrosis.  No acute findings.   Chest x-ray active disease process. In the ED patient received 1 L of NS and 1 L of LR bolus, morphine , Zofran .   Hospitalist has been consulted for further evaluation management for presyncope and acute kidney injury.  Subjective: Seen and examined, feels better.  He has no complaints.  He now feels comfortable going home.  He says that he has been struggling with some dizziness and vertigo for quite some time and he does not feel any different than what he normally does.  Brief/Interim Summary: Patient was briefly hospitalized for postural dizziness with  presyncope episode.  Has been having chronic BPV and vertigo and chronic ringing of the ear.  After gathering more history, he informed me that he typically drinks almost 2 L of water but it was his birthday yesterday and he does not think he drink enough water since he was busy with the gas and then he went out and watch the game in a hot  day yesterday.  Likely dehydration as the cause of postural dizziness and presyncope.  He is feeling back to normal.  Discharging home.  Resuming all home medications.  Also he came in with AKI which was also secondary to prerenal due to dehydration and this has resolved after IV hydration.  Discharge plan was discussed with patient and/or family member and they verbalized understanding and agreed with it.  Discharge Diagnoses:  Principal Problem:   Postural dizziness with presyncope Active Problems:   AKI (acute kidney injury) (HCC)   Chronic chest pain   Chronic vertigo   BPPV (benign paroxysmal positional vertigo), bilateral   Essential hypertension   History of pulmonary embolism   History of DVT (deep vein thrombosis)   History of paroxysmal atrial fibrillation (HCC)   Hyperlipidemia   Pulmonary hypertension (HCC)   Inguinal hernia    Discharge Instructions   Allergies as of 08/16/2023       Reactions   Other Other (See Comments)   Mangos - caused a severe rash   Prozac  [fluoxetine  Hcl]    Made him feel loopy   Sulfa Antibiotics Hives   Vicodin [hydrocodone -acetaminophen ] Itching   Tolerates oxycodone    Erythromycin Rash   Many years ago an oral antibiotic caused a rash(wife and patient think it was erythromycin but they are not positive).        Medication List     TAKE these medications    carvedilol  3.125 MG tablet Commonly known as: COREG  Take 1 tablet (3.125 mg total) by mouth 2 (two) times daily.   meclizine  50 MG tablet Commonly known as: ANTIVERT  Take 0.5 tablets (25 mg total) by mouth 3 (three) times daily as needed.   multivitamin tablet Take 1 tablet by mouth daily.   ondansetron  4 MG disintegrating tablet Commonly known as: ZOFRAN -ODT 4mg  ODT q4 hours prn nausea/vomit   OSTEO BI-FLEX JOINT SHIELD PO Take 1 tablet by mouth daily.   rivaroxaban  20 MG Tabs tablet Commonly known as: XARELTO  Take 1 tablet (20 mg total) by mouth daily with  supper.   tamsulosin  0.4 MG Caps capsule Commonly known as: FLOMAX  TAKE 1 CAPSULE BY MOUTH EVERY DAY   traMADol  50 MG tablet Commonly known as: ULTRAM  TAKE 1 TABLET BY MOUTH EVERY 8 HOURS. What changed:  when to take this reasons to take this        Follow-up Information     Nafziger, Darleene, NP Follow up in 1 week(s).   Specialty: Family Medicine Contact information: [address] [place] [address] [phone]                Allergies  Allergen Reactions   Other Other (See Comments)    Mangos - caused a severe rash   Prozac  [Fluoxetine  Hcl]     Made him feel loopy   Sulfa Antibiotics Hives   Vicodin [Hydrocodone -Acetaminophen ] Itching    Tolerates oxycodone    Erythromycin Rash    Many years ago an oral antibiotic caused a rash(wife and patient think it was erythromycin but they are not positive).    Consultations: None  Procedures/Studies: DG Chest Port 1 View Result Date: 08/16/2023 CLINICAL DATA:  Vomiting and feeling unwell. EXAM: PORTABLE CHEST 1 VIEW COMPARISON:  October 01, 2022 FINDINGS: The heart size and mediastinal contours are within normal limits. Very mild atelectatic changes are seen within the left lung base. No acute infiltrate, pleural effusion or pneumothorax is identified. Multilevel degenerative changes are noted throughout the thoracic spine. IMPRESSION: No active cardiopulmonary disease. Electronically Signed   By: Suzen Dials M.D.   On: 08/16/2023 01:04   CT ABDOMEN PELVIS W CONTRAST Result Date: 08/16/2023 CLINICAL DATA:  Vomiting EXAM: CT ABDOMEN AND PELVIS WITH CONTRAST TECHNIQUE: Multidetector CT imaging of the abdomen and pelvis was performed using the standard protocol following bolus administration of intravenous contrast. RADIATION DOSE REDUCTION: This exam was performed according to the departmental dose-optimization program which includes automated exposure control, adjustment of the mA and/or kV according to  patient size and/or use of iterative reconstruction technique. CONTRAST:  OMNIPAQUE  IOHEXOL  300 MG/ML  SOLN COMPARISON:  04/21/2023 FINDINGS: Lower chest: No acute abnormality Hepatobiliary: No focal hepatic abnormality. Gallbladder unremarkable. Pancreas: No focal abnormality or ductal dilatation. Spleen: No focal abnormality.  Normal size. Adrenals/Urinary Tract: Multiple bilateral nonobstructing renal stones. No ureteral stones or hydronephrosis. Adrenal glands and urinary bladder unremarkable. Stomach/Bowel: Stomach, large and small bowel grossly unremarkable. No bowel obstruction or inflammatory process Vascular/Lymphatic: No evidence of aneurysm or adenopathy. Aortic atherosclerosis. Infrarenal IVC filter in place. Reproductive: Mildly prominent prostate Other: No free fluid or free air. Multiple ventral hernias most of which contain fat only. 1 midline ventral hernia contains the anterior wall of the transverse colon. Moderate to large right inguinal hernia containing small bowel loops. No bowel obstruction. Musculoskeletal: No acute bony abnormality. IMPRESSION: Multiple ventral hernias containing fat. Right inguinal hernia containing small bowel loops. No evidence of bowel obstruction. Bilateral nephrolithiasis.  No ureteral stones or hydronephrosis. No acute findings. Electronically Signed   By: Franky Crease M.D.   On: 08/16/2023 00:15     Discharge Exam: Vitals:   08/16/23 0930 08/16/23 1018  BP: (!) 145/91 119/75  Pulse: (!) 59 65  Resp: (!) 21   Temp:    SpO2: 92% 95%   Vitals:   08/16/23 0810 08/16/23 0845 08/16/23 0930 08/16/23 1018  BP:  (!) 127/94 (!) 145/91 119/75  Pulse:  (!) 54 (!) 59 65  Resp:  16 (!) 21   Temp: 97.7 F (36.5 C)     TempSrc: Oral     SpO2:  98% 92% 95%    General: Pt is alert, awake, not in acute distress Cardiovascular: RRR, S1/S2 +, no rubs, no gallops Respiratory: CTA bilaterally, no wheezing, no rhonchi Abdominal: Soft, NT, ND, bowel sounds  + Extremities: no edema, no cyanosis    The results of significant diagnostics from this hospitalization (including imaging, microbiology, ancillary and laboratory) are listed below for reference.     Microbiology: No results found for this or any previous visit (from the past 240 hours).   Labs: BNP (last 3 results) Recent Labs    09/23/22 0307 09/24/22 0224 09/25/22 0323  BNP 626.8* 1,139.2* 457.0*   Basic Metabolic Panel: Recent Labs  Lab 08/15/23 2213 08/16/23 0621  NA 137 138  K 4.4 4.4  CL 102 105  CO2 26 25  GLUCOSE 133* 100*  BUN 19 17  CREATININE 1.40* 0.81  CALCIUM  9.5 8.9   Liver Function Tests: Recent Labs  Lab 08/15/23 2213  AST 27  ALT 16  ALKPHOS 85  BILITOT 1.0  PROT 7.0  ALBUMIN  4.2   Recent Labs  Lab 08/15/23 2213  LIPASE 33   No results for input(s): AMMONIA in the last 168 hours. CBC: Recent Labs  Lab 08/15/23 2213 08/16/23 0621  WBC 10.4 7.4  NEUTROABS 8.0*  --   HGB 15.5 13.6  HCT 45.5 40.8  MCV 98.7 100.2*  PLT 264 232   Cardiac Enzymes: No results for input(s): CKTOTAL, CKMB, CKMBINDEX, TROPONINI in the last 168 hours. BNP: Invalid input(s): POCBNP CBG: No results for input(s): GLUCAP in the last 168 hours. D-Dimer No results for input(s): DDIMER in the last 72 hours. Hgb A1c No results for input(s): HGBA1C in the last 72 hours. Lipid Profile No results for input(s): CHOL, HDL, LDLCALC, TRIG, CHOLHDL, LDLDIRECT in the last 72 hours. Thyroid  function studies No results for input(s): TSH, T4TOTAL, T3FREE, THYROIDAB in the last 72 hours.  Invalid input(s): FREET3 Anemia work up No results for input(s): VITAMINB12, FOLATE, FERRITIN, TIBC, IRON, RETICCTPCT in the last 72 hours. Urinalysis    Component Value Date/Time   COLORURINE YELLOW 04/21/2023 1639   APPEARANCEUR CLEAR 04/21/2023 1639   LABSPEC 1.005 04/21/2023 1639   PHURINE 8.0 04/21/2023 1639   GLUCOSEU  NEGATIVE 04/21/2023 1639   HGBUR MODERATE (A) 04/21/2023 1639   BILIRUBINUR NEGATIVE 04/21/2023 1639   BILIRUBINUR negative 08/30/2021 1402   KETONESUR NEGATIVE 04/21/2023 1639   PROTEINUR NEGATIVE 04/21/2023 1639   UROBILINOGEN 0.2 08/30/2021 1402   UROBILINOGEN 0.2 10/05/2012 2309   NITRITE NEGATIVE 04/21/2023 1639   LEUKOCYTESUR NEGATIVE 04/21/2023 1639   Sepsis Labs Recent Labs  Lab 08/15/23 2213 08/16/23 0621  WBC 10.4 7.4   Microbiology No results found for this or any previous visit (from the past 240 hours).  FURTHER DISCHARGE INSTRUCTIONS:   Get Medicines reviewed and adjusted: Please take all your medications with you for your next visit with your Primary MD   Laboratory/radiological data: Please request your Primary MD to go over all hospital tests and procedure/radiological results at the follow up, please ask your Primary MD to get all Hospital records sent to his/her office.   In some cases, they will be blood work, cultures and biopsy results pending at the time of your discharge. Please request that your primary care M.D. goes through all the records of your hospital data and follows up on these results.   Also Note the following: If you experience worsening of your admission symptoms, develop shortness of breath, life threatening emergency, suicidal or homicidal thoughts you must seek medical attention immediately by calling 911 or calling your MD immediately  if symptoms less severe.   You must read complete instructions/literature along with all the possible adverse reactions/side effects for all the Medicines you take and that have been prescribed to you. Take any new Medicines after you have completely understood and accpet all the possible adverse reactions/side effects.    patient was instructed, not to drive, operate heavy machinery, perform activities at heights, swimming or participation in water activities or provide baby-sitting services while on Pain,  Sleep and Anxiety Medications; until their outpatient Physician has advised to do so again. Also recommended to not to take more than prescribed Pain, Sleep and Anxiety Medications.  It is not advisable to combine anxiety, sleep and pain medications without talking with your primary care provider.     Wear Seat belts while driving.   Please note: You were cared for by a hospitalist during your hospital stay.  Once you are discharged, your primary care physician will handle any further medical issues. Please note that NO REFILLS for any discharge medications will be authorized once you are discharged, as it is imperative that you return to your primary care physician (or establish a relationship with a primary care physician if you do not have one) for your post hospital discharge needs so that they can reassess your need for medications and monitor your lab values  Time coordinating discharge: Over 30 minutes  SIGNED:   Fredia Skeeter, MD  [facility] 08/16/2023, 10:34 AM *Please note that this is a verbal dictation therefore any spelling or grammatical errors are due to the Dragon [employer] One system interpretation. If 7PM-7AM, please contact night-coverage [email or web address]

## 2023-08-16 NOTE — ED Notes (Signed)
 Pt voided 300cc of clear, yellow-straw urine.

## 2023-08-16 NOTE — H&P (Addendum)
 History and Physical    [name] FMW:[id number] DOB: 1940-10-23 DOA: 08/15/2023  PCP: Merna Huxley, NP   Patient coming from: Home   Chief Complaint:  Chief Complaint  Patient presents with   Weakness   Emesis   ED TRIAGE note:  Pt arriving from [employer]. During second half of game, pt became clammy and pale and had episodes of vomiting. Medical staff on site placed 18g IV in right wrist and gave 4mg  Zofran .             HPI:  [name] is a 83 y.o. 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 Present emergency department with complaining of feeling unwell, diaphoretic, presyncope and 1 episode of vomiting.  Patient did not lost consciousness however felt like he is going to pass out. In the ED patient is complaining about left-sided chest pain.  Further history from the patient revealed that he has chronic left-sided chest pain since he was diagnosed with PE in 2024.  Patient reported that he was out for his birthday while he drank a very small amount of alcohol.  He was playing [employer] game suddenly feeling nauseated and has multiple episodes of vomiting. Patient reported that he has been scheduled for hernia repair but it has been delayed because he recently diagnosed with COVID.  Patient also reported he has ringing sensation of his ear for a long time and he feels dizzy and nauseated when he turns a to left or right side.  Never has been previously evaluated by ENT or vestibular therapist outpatient.  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  Patient denies any shortness of breath, palpitation, abdominal pain, vomiting and diarrhea.  Of note, patient reported that when in the ED he received second bolus of LR suddenly started tremor of his hands and once the fluid has been stopped the  tremor resolved.  ED Course:  At presentation to ED patient is hemodynamically stable. Troponin x 2 within normal range. Normal lipase level.  CMP showed elevated creatinine 1.4 otherwise unremarkable.  CBC unremarkable. ED showed normal sinus rhythm heart rate 59, borderline T abnormality normal QTc.  CT abdomen pelvis showed: Multiple ventral hernias containing fat. Right inguinal hernia containing small bowel loops. No evidence of bowel obstruction.  Bilateral nephrolithiasis.  No ureteral stones or hydronephrosis.  No acute findings.  Chest x-ray active disease process. In the ED patient received 1 L of NS and 1 L of LR bolus, morphine , Zofran .  Hospitalist has been consulted for further evaluation management for presyncope and acute kidney injury.  Significant labs in the ED: Lab Orders         Comprehensive metabolic panel         CBC with Differential         Lipase, blood         Basic metabolic panel         CBC       Review of Systems:  Review of Systems  Constitutional:  Negative for chills, fever and weight loss.  HENT:  Positive for tinnitus. Negative for ear pain.   Eyes:  Negative for blurred vision and double vision.  Respiratory:  Negative for cough, sputum production and shortness of breath.   Cardiovascular:  Negative for chest pain and palpitations.  Gastrointestinal:  Positive for nausea. Negative for abdominal pain, diarrhea, heartburn and vomiting.  Musculoskeletal:  Negative for back pain, falls and neck pain.  Neurological:  Positive for dizziness. Negative for tingling, loss of consciousness and headaches.  Psychiatric/Behavioral:  The patient is not nervous/anxious.     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   Colonic mass 2001   Fatigue    x 1  week o as of 02-17-2020   Fibromyalgia 1990's    Frequent PVCs    Generalized body aches    x 1 week as of 02-17-2020   H/O inguinal hernia    right side   Hernia, umbilical    History of kidney stones    HX OF MULTIPLE KIDNEY STONES AND  CURRENTLY HAS BILATERAL RENAL STONES CAUSING ABDOMINAL AND BACK PAIN   Hypertension    high blood pressure readings    NSVT (nonsustained ventricular tachycardia) (HCC)    Pneumonia    Premature atrial contractions    Wears dentures    full dentures    Past Surgical History:  Procedure Laterality Date   BRAIN TUMOR EXCISION  age 82   CARDIAC CATHETERIZATION N/A 04/09/2015   Procedure: Left Heart Cath and Coronary Angiography;  Surgeon: Lonni JONETTA Cash, MD;  Location: [facility] INVASIVE CV LAB;  Service: Cardiovascular;  Laterality: N/A;   COLON SURGERY  2001   COLON RESECTION FOR GROWTH - NOT CANCER   CYSTOSCOPY WITH RETROGRADE PYELOGRAM, URETEROSCOPY AND STENT PLACEMENT Left 10/05/2012   Procedure: CYSTOSCOPY WITH RETROGRADE PYELOGRAM, URETEROSCOPY AND STENT PLACEMENT;  Surgeon: Ricardo Likens, MD;  Location: [facility];  Service: Urology;  Laterality: Left;   CYSTOSCOPY/RETROGRADE/URETEROSCOPY/STONE EXTRACTION WITH BASKET Right 10/05/2012   Procedure: CYSTOSCOPY/RETROGRADE/URETEROSCOPY/STONE EXTRACTION WITH BASKET;  Surgeon: Ricardo Likens, MD;  Location: [facility];  Service: Urology;  Laterality: Right;   CYSTOSCOPY/RETROGRADE/URETEROSCOPY/STONE EXTRACTION WITH BASKET Bilateral 10/24/2012   Procedure: CYSTOSCOPY/RETROGRADE/URETEROSCOPY/STONE EXTRACTION WITH BASKET;  Surgeon: Ricardo Likens, MD;  Location: [facility];  Service: Urology;  Laterality: Bilateral;  with bilateral stent placement   CYSTOSCOPY/URETEROSCOPY/HOLMIUM LASER/STENT PLACEMENT Left 05/02/2023   Procedure: CYSTOSCOPY/URETEROSCOPY/HOLMIUM LASER/STENT PLACEMENT;  Surgeon: Shane Steffan BROCKS, MD;  Location: [facility];  Service: Urology;  Laterality: Left;   HOLMIUM LASER APPLICATION Right 10/05/2012    Procedure: HOLMIUM LASER APPLICATION;  Surgeon: Ricardo Likens, MD;  Location: [facility];  Service: Urology;  Laterality: Right;   HOLMIUM LASER APPLICATION Bilateral 10/24/2012   Procedure: HOLMIUM LASER APPLICATION;  Surgeon: Ricardo Likens, MD;  Location: [facility];  Service: Urology;  Laterality: Bilateral;   IR ANGIOGRAM PULMONARY BILATERAL SELECTIVE  09/21/2022   IR INFUSION THROMBOL ARTERIAL INITIAL (MS)  09/21/2022   IR INFUSION THROMBOL ARTERIAL INITIAL (MS)  09/21/2022   IR IVC FILTER PLMT / S&I /IMG GUID/MOD SED  09/25/2022   IR THROMB F/U EVAL ART/VEN FINAL DAY (MS)  09/22/2022   IR US  GUIDE VASC ACCESS RIGHT  09/21/2022   LITHOTRIPSY     TONSILLECTOMY  age 38's   and adenoids     reports that he has never smoked. He has never used smokeless tobacco. He reports that he does not currently use alcohol after a past usage of about 7.0 standard drinks of alcohol per week. He reports that he does not currently use drugs.  Allergies  Allergen Reactions   Other  Other (See Comments)    Mangos - caused a severe rash   Prozac  [Fluoxetine  Hcl]     Made him feel loopy   Sulfa Antibiotics Hives   Vicodin [Hydrocodone -Acetaminophen ] Itching    Tolerates oxycodone    Erythromycin Rash    Many years ago an oral antibiotic caused a rash(wife and patient think it was erythromycin but they are not positive).    Family History  Problem Relation Age of Onset   Colon cancer Father    Heart disease Father        quad bypass    Prior to Admission medications   Medication Sig Start Date End Date Taking? Authorizing Provider  carvedilol  (COREG ) 3.125 MG tablet Take 1 tablet (3.125 mg total) by mouth 2 (two) times daily. 10/08/22   Odell Celinda Balo, MD  meclizine  (ANTIVERT ) 50 MG tablet Take 0.5 tablets (25 mg total) by mouth 3 (three) times daily as needed. 07/19/23 08/18/23  Nafziger, Darleene, NP  Misc Natural Products (OSTEO BI-FLEX ADV JOINT SHIELD PO) Take 2 capsules by mouth daily.    [provider]  Multiple Vitamin (MULTIVITAMIN) tablet Take 1 tablet by mouth daily.    [provider]  ondansetron  (ZOFRAN -ODT) 4 MG disintegrating tablet 4mg  ODT q4 hours prn nausea/vomit 04/21/23   Patt Alm Macho, MD  rivaroxaban  (XARELTO ) 20 MG TABS tablet Take 1 tablet (20 mg total) by mouth daily with supper. 11/25/22   Nafziger, Darleene, NP  tamsulosin  (FLOMAX ) 0.4 MG CAPS capsule TAKE 1 CAPSULE BY MOUTH EVERY DAY 02/15/23   Nafziger, Darleene, NP  traMADol  (ULTRAM ) 50 MG tablet TAKE 1 TABLET BY MOUTH EVERY 8 HOURS. 05/16/23   Merna Darleene, NP     Physical Exam: Vitals:   08/16/23 0034 08/16/23 0315 08/16/23 0330 08/16/23 0428  BP: 133/85 118/88 (!) 123/98   Pulse: 65 62 65   Resp: 18 14 15    Temp: 98.3 F (36.8 C)   97.7 F (36.5 C)  TempSrc:    Oral  SpO2: 97% 91% 91%     Physical Exam Constitutional:      Appearance: Normal appearance. He is not ill-appearing.  HENT:     Head: Normocephalic and atraumatic.     Comments: ER tinnitus/ringing    Right Ear: Tympanic membrane, ear canal and external ear normal. There is no impacted cerumen.     Left Ear: Tympanic membrane, ear canal and external ear normal. There is no impacted cerumen.     Mouth/Throat:     Mouth: Mucous membranes are dry.  Eyes:     Pupils: Pupils are equal, round, and reactive to light.  Cardiovascular:     Rate and Rhythm: Normal rate and regular rhythm.     Pulses: Normal pulses.     Heart sounds: Normal heart sounds.  Pulmonary:     Effort: Pulmonary effort is normal.     Breath sounds: Normal breath sounds.  Musculoskeletal:     Cervical back: Neck supple.     Right lower leg: No edema.     Left lower leg: No edema.  Skin:    Capillary Refill: Capillary refill takes less than 2 seconds.  Neurological:     Mental Status: He is alert and oriented to person, place, and time.     Cranial Nerves: No cranial nerve deficit.     Sensory: No sensory deficit.     Motor: No weakness.      Coordination: Coordination normal.     Deep  Tendon Reflexes: Reflexes normal.  Psychiatric:        Mood and Affect: Mood normal.      Labs on Admission: I have personally reviewed following labs and imaging studies  CBC: Recent Labs  Lab 08/15/23 2213  WBC 10.4  NEUTROABS 8.0*  HGB 15.5  HCT 45.5  MCV 98.7  PLT 264   Basic Metabolic Panel: Recent Labs  Lab 08/15/23 2213  NA 137  K 4.4  CL 102  CO2 26  GLUCOSE 133*  BUN 19  CREATININE 1.40*  CALCIUM  9.5   GFR: Estimated Creatinine Clearance: 44.7 mL/min (A) (by C-G formula based on SCr of 1.4 mg/dL (H)). Liver Function Tests: Recent Labs  Lab 08/15/23 2213  AST 27  ALT 16  ALKPHOS 85  BILITOT 1.0  PROT 7.0  ALBUMIN  4.2   Recent Labs  Lab 08/15/23 2213  LIPASE 33   No results for input(s): AMMONIA in the last 168 hours. Coagulation Profile: No results for input(s): INR, PROTIME in the last 168 hours. Cardiac Enzymes: Recent Labs  Lab 08/15/23 2213 08/16/23 0023  TROPONINIHS 7 7   BNP (last 3 results) Recent Labs    09/23/22 0307 09/24/22 0224 09/25/22 0323  BNP 626.8* 1,139.2* 457.0*   HbA1C: No results for input(s): HGBA1C in the last 72 hours. CBG: No results for input(s): GLUCAP in the last 168 hours. Lipid Profile: No results for input(s): CHOL, HDL, LDLCALC, TRIG, CHOLHDL, LDLDIRECT in the last 72 hours. Thyroid  Function Tests: No results for input(s): TSH, T4TOTAL, FREET4, T3FREE, THYROIDAB in the last 72 hours. Anemia Panel: No results for input(s): VITAMINB12, FOLATE, FERRITIN, TIBC, IRON, RETICCTPCT in the last 72 hours. Urine analysis:    Component Value Date/Time   COLORURINE YELLOW 04/21/2023 1639   APPEARANCEUR CLEAR 04/21/2023 1639   LABSPEC 1.005 04/21/2023 1639   PHURINE 8.0 04/21/2023 1639   GLUCOSEU NEGATIVE 04/21/2023 1639   HGBUR MODERATE (A) 04/21/2023 1639   BILIRUBINUR NEGATIVE 04/21/2023 1639   BILIRUBINUR  negative 08/30/2021 1402   KETONESUR NEGATIVE 04/21/2023 1639   PROTEINUR NEGATIVE 04/21/2023 1639   UROBILINOGEN 0.2 08/30/2021 1402   UROBILINOGEN 0.2 10/05/2012 2309   NITRITE NEGATIVE 04/21/2023 1639   LEUKOCYTESUR NEGATIVE 04/21/2023 1639    Radiological Exams on Admission: I have personally reviewed images DG Chest Port 1 View Result Date: 08/16/2023 CLINICAL DATA:  Vomiting and feeling unwell. EXAM: PORTABLE CHEST 1 VIEW COMPARISON:  October 01, 2022 FINDINGS: The heart size and mediastinal contours are within normal limits. Very mild atelectatic changes are seen within the left lung base. No acute infiltrate, pleural effusion or pneumothorax is identified. Multilevel degenerative changes are noted throughout the thoracic spine. IMPRESSION: No active cardiopulmonary disease. Electronically Signed   By: Suzen Dials M.D.   On: 08/16/2023 01:04   CT ABDOMEN PELVIS W CONTRAST Result Date: 08/16/2023 CLINICAL DATA:  Vomiting EXAM: CT ABDOMEN AND PELVIS WITH CONTRAST TECHNIQUE: Multidetector CT imaging of the abdomen and pelvis was performed using the standard protocol following bolus administration of intravenous contrast. RADIATION DOSE REDUCTION: This exam was performed according to the departmental dose-optimization program which includes automated exposure control, adjustment of the mA and/or kV according to patient size and/or use of iterative reconstruction technique. CONTRAST:  OMNIPAQUE  IOHEXOL  300 MG/ML  SOLN COMPARISON:  04/21/2023 FINDINGS: Lower chest: No acute abnormality Hepatobiliary: No focal hepatic abnormality. Gallbladder unremarkable. Pancreas: No focal abnormality or ductal dilatation. Spleen: No focal abnormality.  Normal size. Adrenals/Urinary Tract: Multiple bilateral nonobstructing renal  stones. No ureteral stones or hydronephrosis. Adrenal glands and urinary bladder unremarkable. Stomach/Bowel: Stomach, large and small bowel grossly unremarkable. No bowel  obstruction or inflammatory process Vascular/Lymphatic: No evidence of aneurysm or adenopathy. Aortic atherosclerosis. Infrarenal IVC filter in place. Reproductive: Mildly prominent prostate Other: No free fluid or free air. Multiple ventral hernias most of which contain fat only. 1 midline ventral hernia contains the anterior wall of the transverse colon. Moderate to large right inguinal hernia containing small bowel loops. No bowel obstruction. Musculoskeletal: No acute bony abnormality. IMPRESSION: Multiple ventral hernias containing fat. Right inguinal hernia containing small bowel loops. No evidence of bowel obstruction. Bilateral nephrolithiasis.  No ureteral stones or hydronephrosis. No acute findings. Electronically Signed   By: Franky Crease M.D.   On: 08/16/2023 00:15     EKG: My personal interpretation of EKG shows: EKG normal sinus rhythm heart rate 59, normal QTc interval and there is no ST acute abnormality.    Assessment/Plan: Principal Problem:   Postural dizziness with presyncope Active Problems:   AKI (acute kidney injury) (HCC)   Chronic chest pain   Chronic vertigo   BPPV (benign paroxysmal positional vertigo), bilateral   Essential hypertension   History of pulmonary embolism   History of DVT (deep vein thrombosis)   History of paroxysmal atrial fibrillation (HCC)   Hyperlipidemia   Pulmonary hypertension (HCC)   Inguinal hernia    Assessment and Plan: Postural dizziness with presyncope -Present emergency department complaining of presyncopal episodes.  Patient was playing [employer] and suddenly felt dizzy, nauseated and almost passed out.  He has also history of chronic vertigo as well.  Patient never passed out or lost consciousness.  At baseline he has chronic left-sided chest pain since he diagnosed with pulmonary embolism in August 2024. - At presentation to ED patient is hemodynamically stable. -CBC unremarkable.  CMP showed elevated creatinine 1.4  otherwise unremarkable.  Troponin x 2 within normal range.  EKG showing sinus bradycardia heart rate 59. - Chest x-ray no acute finding. - CT ABD no acute finding.  Showed multiple ventral hernia containing fat.  Right inguinal hernia containing in the small bowel loop.  No evidence of SBO.  Bilateral nephrolithiasis.  No evidence of ureteral stone or hydronephrosis. -Patient also reported drinking some alcohol as well. -Presyncope in the setting of playing games in hot humid weather, history of chronic vertigo and drinking alcohol probably contributing as well. - Given significant cardiac history of SVT, chronic chest pain, history of PE and DVT admitting overnight to make sure he does not develop any presyncope episode. -Checking orthostatic vital. - Continue cardiac monitoring - In the ED patient received 2 L of LR bolus.  Continue maintenance fluid LR 100 cc/h.  BPPV Chronic vertigo Chronic ringing of ear - Patient reported he has chronic ringing sensation of his ear which causes vertigo.  Also reported he feels dizzy moving around his head left or right side. - Based on patient history concern for underlying BPPV which caused the possible presyncopal episode earlier. -At home patient is on meclizine  as needed hold child continue. - Consulting inpatient PT vestibular therapy for evaluation.  Acute kidney injury -Elevated creatinine 1.4.  Prerenal acute kidney during the setting of dehydration from alcohol use and playing in hot humid weather. - Continue maintenance fluid LR.  Avoid nephrotoxic agent.  Monitor urine output.  History of chronic vertigo and nausea associated vertigo - Continue meclizine  as needed  Chronic chest pain -History of chronic left-sided chest  pain since he was diagnosed with DVT and PE.  Troponin times within normal range.  EKG no ST-T wave abnormality.  At this time there is no concern for acute coronary syndrome.  Essential hypertension -Continue  Coreg .  History of DVT and PE -Continue Xarelto   History of paroxysmal atrial fibrillation -Continue Xarelto  and Coreg   BPH - Continue Flomax   DVT prophylaxis:  Xarelto  Code Status:  Full Code Diet: Heart healthy diet Family Communication:   Family was present at bedside, at the time of interview. Opportunity was given to ask question and all questions were answered satisfactorily.  Disposition Plan: Continue to monitor the development of any syncopal episode. Consults: None indicated at this time Admission status:   Observation, Telemetry bed  Severity of Illness: The appropriate patient status for this patient is OBSERVATION. Observation status is judged to be reasonable and necessary in order to provide the required intensity of service to ensure the patient's safety. The patient's presenting symptoms, physical exam findings, and initial radiographic and laboratory data in the context of their medical condition is felt to place them at decreased risk for further clinical deterioration. Furthermore, it is anticipated that the patient will be medically stable for discharge from the hospital within 2 midnights of admission.     Musab Wingard, MD [facility]  How to contact the [facility] Attending or Consulting provider 7A - 7P or covering provider during after hours 7P -7A, for this patient.  Check the care team in [facility] and look for a) attending/consulting [facility] provider listed and b) the TRH team listed Log into [email or web address] and use [place]'s universal password to access. If you do not have the password, please contact the hospital operator. Locate the TR[facility] provider you are looking for under [facility] and page to a number that you can be directly reached. If you still have difficulty reaching the provider, please page the [facility] (Director on Call) for the Hospitalists listed on amion for assistance.  08/16/2023, 4:48 AM

## 2023-08-16 NOTE — ED Notes (Signed)
Bladder scan showed 233 mL.

## 2023-08-16 NOTE — ED Notes (Signed)
 Patient has called family and will be discharged once family arrives.

## 2023-08-17 ENCOUNTER — Telehealth: Payer: Self-pay

## 2023-08-17 NOTE — Transitions of Care (Post Inpatient/ED Visit) (Signed)
   08/17/2023  Name: [name] MRN: [id number] DOB: 12-26-1940  Today's TOC FU Call Status: Today's TOC FU Call Status:: Successful TOC FU Call Completed TOC FU Call Complete Date: 08/17/23 Patient's Name and Date of Birth confirmed.  Transition Care Management Follow-up Telephone Call Date of Discharge: 08/16/23 Discharge Facility: [facility]) Type of Discharge: Inpatient Admission Primary Inpatient Discharge Diagnosis:: N/V dizziness How have you been since you were released from the hospital?: Better Any questions or concerns?: No  Items Reviewed: Did you receive and understand the discharge instructions provided?: Yes Medications obtained,verified, and reconciled?: Yes (Medications Reviewed) Any new allergies since your discharge?: No Dietary orders reviewed?: Yes Do you have support at home?: Yes People in Home [RPT]: spouse  Medications Reviewed Today: Medications Reviewed Today     Reviewed by Emmitt Pan, LPN (Licensed Practical Nurse) on 08/17/23 at 1027  Med List Status: <None>   Medication Order Taking? Sig Documenting Provider Last Dose Status Informant  carvedilol  (COREG ) 3.125 MG tablet [id number] Yes Take 1 tablet (3.125 mg total) by mouth 2 (two) times daily. Odell Celinda Balo, MD  Active Self  meclizine  (ANTIVERT ) 50 MG tablet [id number] Yes Take 0.5 tablets (25 mg total) by mouth 3 (three) times daily as needed. Merna Huxley, NP  Active Self  Misc Natural Products (OSTEO BI-FLEX JOINT SHIELD PO) [id number] Yes Take 1 tablet by mouth daily. [provider]  Active Self  Multiple Vitamin (MULTIVITAMIN) tablet [id number] Yes Take 1 tablet by mouth daily. [provider]  Active Self  ondansetron  (ZOFRAN -ODT) 4 MG disintegrating tablet [id number] Yes 4mg  ODT q4 hours prn nausea/vomit Patt Alm Macho, MD  Active Self  rivaroxaban  (XARELTO ) 20 MG TABS tablet [id number] Yes Take 1 tablet (20 mg total) by mouth daily with supper.  Nafziger, Huxley, NP  Active Self  tamsulosin  (FLOMAX ) 0.4 MG CAPS capsule [id number] Yes TAKE 1 CAPSULE BY MOUTH EVERY DAY Nafziger, Huxley, NP  Active Self  traMADol  (ULTRAM ) 50 MG tablet [id number] Yes TAKE 1 TABLET BY MOUTH EVERY 8 HOURS.  Patient taking differently: Take 50 mg by mouth every 8 (eight) hours as needed.   Nafziger, Huxley, NP  Active Self            Home Care and Equipment/Supplies: Were Home Health Services Ordered?: NA Any new equipment or medical supplies ordered?: NA  Functional Questionnaire: Do you need assistance with bathing/showering or dressing?: No Do you need assistance with meal preparation?: No Do you need assistance with eating?: No Do you have difficulty maintaining continence: No Do you need assistance with getting out of bed/getting out of a chair/moving?: No Do you have difficulty managing or taking your medications?: No  Follow up appointments reviewed: PCP Follow-up appointment confirmed?: Yes Date of PCP follow-up appointment?: 08/22/23 Follow-up Provider: [facility] Follow-up appointment confirmed?: NA Do you need transportation to your follow-up appointment?: No Do you understand care options if your condition(s) worsen?: Yes-patient verbalized understanding    SIGNATURE Pan Emmitt, LPN [facility] Nurse Health Advisor Direct Dial [phone]

## 2023-08-22 ENCOUNTER — Ambulatory Visit (INDEPENDENT_AMBULATORY_CARE_PROVIDER_SITE_OTHER): Admitting: Adult Health

## 2023-08-22 ENCOUNTER — Encounter: Payer: Self-pay | Admitting: Adult Health

## 2023-08-22 VITALS — BP 120/80 | HR 71 | Temp 97.4°F | Ht 72.0 in | Wt 177.0 lb

## 2023-08-22 DIAGNOSIS — R112 Nausea with vomiting, unspecified: Secondary | ICD-10-CM

## 2023-08-22 DIAGNOSIS — K219 Gastro-esophageal reflux disease without esophagitis: Secondary | ICD-10-CM

## 2023-08-22 MED ORDER — ONDANSETRON 4 MG PO TBDP: ORAL_TABLET | ORAL | 2 refills | Status: DC

## 2023-08-22 MED ORDER — PANTOPRAZOLE SODIUM 40 MG PO TBEC: 40.0000 mg | DELAYED_RELEASE_TABLET | Freq: Every day | ORAL | 1 refills | Status: AC

## 2023-08-22 NOTE — Progress Notes (Signed)
 Subjective:    Patient ID: [name], male    DOB: 03-05-1940, 83 y.o.   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  He presents to the office today after being seen in the emergency room on 08/15/2023.  Per ER visit note he was at the [employer] started to feel unwell and have episodes of vomiting.  Patient reported that he was out for his birthday, felt a little nauseated and had 4 episodes of vomiting.  In the ER he had a CT scan of the chest abdomen pelvis which was negative for acute abnormality.  Chest x-ray was negative.  EKG showed normal sinus rhythm, and blood work including CBC, BMP, and troponin troponins were unremarkable.  Today he reports that he continues to feel nauseated but has not had any vomiting. He has been working on increasing fluids. He has ongoing bloating and feeling gassy that has been present for a couple of months but he has not brought this up before. He denies diarrhea or constipation.    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   Colonic mass 2001   Fatigue    x 1 week o as of 02-17-2020   Fibromyalgia 1990's    Frequent PVCs    Generalized body aches    x 1 week as of 02-17-2020   H/O inguinal hernia    right side   Hernia, umbilical    History of kidney stones    HX OF MULTIPLE KIDNEY STONES AND   CURRENTLY HAS BILATERAL RENAL STONES CAUSING ABDOMINAL AND BACK PAIN   Hypertension    high blood pressure readings    NSVT (nonsustained ventricular tachycardia) (HCC)    Pneumonia    Premature atrial contractions    Wears dentures    full dentures    Social History   Socioeconomic History   Marital status: Married    Spouse name: Not on file   Number of children: 1   Years of education: Not on file   Highest education level: Not on file  Occupational History   Not on file  Tobacco Use   Smoking status: Never   Smokeless tobacco: Never  Vaping Use   Vaping status: Never Used  Substance and Sexual Activity   Alcohol use: Not Currently    Alcohol/week: 7.0 standard drinks of alcohol    Types: 7 Standard drinks or equivalent per week   Drug use: Not Currently   Sexual activity: Not Currently  Other Topics Concern   Not on file  Social History Narrative   Retired from Engineer, manufacturing    Has one son - lives local    Likes to play Tennis   Social Drivers of Health   Financial Resource Strain: Low Risk  (05/06/2021)   Overall Financial Resource Strain (CARDIA)    Difficulty of Paying  Living Expenses: Not hard at all  Food Insecurity: No Food Insecurity (10/06/2022)   Hunger Vital Sign    Worried About Running Out of Food in the Last Year: Never true    Ran Out of Food in the Last Year: Never true  Transportation Needs: No Transportation Needs (10/06/2022)   PRAPARE - Administrator, Civil Service (Medical): No    Lack of Transportation (Non-Medical): No  Physical Activity: Insufficiently Active (05/06/2021)   Exercise Vital Sign    Days of Exercise per Week: 1 day    Minutes of Exercise per Session: 90 min  Stress: No Stress Concern Present (05/06/2021)   [employer] of Occupational Health - Occupational Stress Questionnaire    Feeling of Stress : Not at all  Social Connections: Moderately Isolated (05/06/2021)   Social Connection and Isolation Panel     Frequency of Communication with Friends and Family: More than three times a week    Frequency of Social Gatherings with Friends and Family: More than three times a week    Attends Religious Services: Never    Database administrator or Organizations: Yes    Attends Engineer, structural: More than 4 times per year    Marital Status: Never married  Intimate Partner Violence: Not At Risk (09/22/2022)   Humiliation, Afraid, Rape, and Kick questionnaire    Fear of Current or Ex-Partner: No    Emotionally Abused: No    Physically Abused: No    Sexually Abused: No    Past Surgical History:  Procedure Laterality Date   BRAIN TUMOR EXCISION  age 40   CARDIAC CATHETERIZATION N/A 04/09/2015   Procedure: Left Heart Cath and Coronary Angiography;  Surgeon: Lonni JONETTA Cash, MD;  Location: [facility];  Service: Cardiovascular;  Laterality: N/A;   COLON SURGERY  2001   COLON RESECTION FOR GROWTH - NOT CANCER   CYSTOSCOPY WITH RETROGRADE PYELOGRAM, URETEROSCOPY AND STENT PLACEMENT Left 10/05/2012   Procedure: CYSTOSCOPY WITH RETROGRADE PYELOGRAM, URETEROSCOPY AND STENT PLACEMENT;  Surgeon: Ricardo Likens, MD;  Location: [facility];  Service: Urology;  Laterality: Left;   CYSTOSCOPY/RETROGRADE/URETEROSCOPY/STONE EXTRACTION WITH BASKET Right 10/05/2012   Procedure: CYSTOSCOPY/RETROGRADE/URETEROSCOPY/STONE EXTRACTION WITH BASKET;  Surgeon: Ricardo Likens, MD;  Location: [facility];  Service: Urology;  Laterality: Right;   CYSTOSCOPY/RETROGRADE/URETEROSCOPY/STONE EXTRACTION WITH BASKET Bilateral 10/24/2012   Procedure: CYSTOSCOPY/RETROGRADE/URETEROSCOPY/STONE EXTRACTION WITH BASKET;  Surgeon: Ricardo Likens, MD;  Location: [facility];  Service: Urology;  Laterality: Bilateral;  with bilateral stent placement   CYSTOSCOPY/URETEROSCOPY/HOLMIUM LASER/STENT PLACEMENT Left 05/02/2023   Procedure: CYSTOSCOPY/URETEROSCOPY/HOLMIUM LASER/STENT PLACEMENT;  Surgeon: Shane Steffan BROCKS, MD;  Location: [facility];   Service: Urology;  Laterality: Left;   HOLMIUM LASER APPLICATION Right 10/05/2012   Procedure: HOLMIUM LASER APPLICATION;  Surgeon: Ricardo Likens, MD;  Location: [facility];  Service: Urology;  Laterality: Right;   HOLMIUM LASER APPLICATION Bilateral 10/24/2012   Procedure: HOLMIUM LASER APPLICATION;  Surgeon: Ricardo Likens, MD;  Location: [facility];  Service: Urology;  Laterality: Bilateral;   IR ANGIOGRAM PULMONARY BILATERAL SELECTIVE  09/21/2022   IR INFUSION THROMBOL ARTERIAL INITIAL (MS)  09/21/2022   IR INFUSION THROMBOL ARTERIAL INITIAL (MS)  09/21/2022   IR IVC FILTER PLMT / S&I /IMG GUID/MOD SED  09/25/2022   IR THROMB F/U EVAL ART/VEN FINAL DAY (MS)  09/22/2022   IR US  GUIDE VASC ACCESS RIGHT  09/21/2022   LITHOTRIPSY     TONSILLECTOMY  age 60's   and adenoids    Family History  Problem Relation Age of Onset   Colon cancer Father    Heart disease Father        quad bypass    Allergies  Allergen Reactions   Other Other (See Comments)    Mangos - caused a severe rash   Prozac  [Fluoxetine  Hcl]     Made him feel loopy   Sulfa Antibiotics Hives   Vicodin [Hydrocodone -Acetaminophen ] Itching    Tolerates oxycodone    Erythromycin Rash    Many years ago an oral antibiotic caused a rash(wife and patient think it was erythromycin but they are not positive).    Current Outpatient Medications on File Prior to Visit  Medication Sig Dispense Refill   carvedilol  (COREG ) 3.125 MG tablet Take 1 tablet (3.125 mg total) by mouth 2 (two) times daily. 180 tablet 1   Misc Natural Products (OSTEO BI-FLEX JOINT SHIELD PO) Take 1 tablet by mouth daily.     Multiple Vitamin (MULTIVITAMIN) tablet Take 1 tablet by mouth daily.     ondansetron  (ZOFRAN -ODT) 4 MG disintegrating tablet 4mg  ODT q4 hours prn nausea/vomit 10 tablet 0   rivaroxaban  (XARELTO ) 20 MG TABS tablet Take 1 tablet (20 mg total) by mouth daily with supper. 30 tablet 11   tamsulosin  (FLOMAX ) 0.4 MG CAPS capsule TAKE 1 CAPSULE BY MOUTH  EVERY DAY 90 capsule 3   traMADol  (ULTRAM ) 50 MG tablet TAKE 1 TABLET BY MOUTH EVERY 8 HOURS. (Patient taking differently: Take 50 mg by mouth every 8 (eight) hours as needed.) 90 tablet 2   No current facility-administered medications on file prior to visit.    BP 120/80   Pulse 71   Temp (!) 97.4 F (36.3 C) (Oral)   Ht 6' (1.829 m)   Wt 177 lb (80.3 kg)   SpO2 97%   BMI 24.01 kg/m       Objective:   Physical Exam Vitals and nursing note reviewed.  Constitutional:      Appearance: Normal appearance.  Cardiovascular:     Rate and Rhythm: Normal rate and regular rhythm.     Pulses: Normal pulses.     Heart sounds: Normal heart sounds.  Pulmonary:     Effort: Pulmonary effort is normal.     Breath sounds: Normal breath sounds.  Abdominal:     General: Abdomen is flat. Bowel sounds are normal.     Palpations: Abdomen is soft.     Tenderness: There is abdominal tenderness in the epigastric area.  Skin:    General: Skin is warm and dry.  Neurological:     General: No focal deficit present.     Mental Status: He is alert and oriented to person, place, and time.  Psychiatric:        Mood and Affect: Mood normal.        Behavior: Behavior normal.        Thought Content: Thought content normal.        Judgment: Judgment normal.       Assessment & Plan:  1. Nausea and vomiting, unspecified vomiting type (Primary) - Possible dehydration vs vasovagal syncope vs GERD. He does have symptoms consistent with GERD and I will start him on Protonix . Will send in Zofran  to have on hand. Continue to increase fluids  - ondansetron  (ZOFRAN -ODT) 4 MG disintegrating tablet; 4mg  ODT q4 hours prn nausea/vomit  Dispense: 10 tablet; Refill: 2  2. Gastroesophageal reflux disease without esophagitis  - pantoprazole  (PROTONIX ) 40 MG tablet; Take 1 tablet (40 mg total)  by mouth daily.  Dispense: 90 tablet; Refill: 1  Armonee Bojanowski, NP

## 2023-09-28 ENCOUNTER — Other Ambulatory Visit: Payer: Self-pay | Admitting: Adult Health

## 2023-09-28 DIAGNOSIS — I10 Essential (primary) hypertension: Secondary | ICD-10-CM

## 2023-10-13 ENCOUNTER — Other Ambulatory Visit: Payer: Self-pay | Admitting: Adult Health

## 2023-10-13 DIAGNOSIS — M150 Primary generalized (osteo)arthritis: Secondary | ICD-10-CM

## 2023-10-13 NOTE — Telephone Encounter (Signed)
 Pt stated he has not scheduled with pain management. I advised pt that he need to schedule soon so he will be able to get medication for pain. Pt requesting a refill until he gets his appt.

## 2023-10-17 ENCOUNTER — Encounter: Payer: Self-pay | Admitting: Adult Health

## 2023-10-17 ENCOUNTER — Ambulatory Visit (INDEPENDENT_AMBULATORY_CARE_PROVIDER_SITE_OTHER): Admitting: Adult Health

## 2023-10-17 VITALS — BP 120/100 | HR 65 | Temp 97.9°F | Ht 72.0 in | Wt 178.0 lb

## 2023-10-17 DIAGNOSIS — R051 Acute cough: Secondary | ICD-10-CM | POA: Diagnosis not present

## 2023-10-17 DIAGNOSIS — J0140 Acute pansinusitis, unspecified: Secondary | ICD-10-CM | POA: Diagnosis not present

## 2023-10-17 MED ORDER — PREDNISONE 10 MG PO TABS: 10.0000 mg | ORAL_TABLET | Freq: Every day | ORAL | 0 refills | Status: DC

## 2023-10-17 MED ORDER — AMOXICILLIN-POT CLAVULANATE 875-125 MG PO TABS: 1.0000 | ORAL_TABLET | Freq: Two times a day (BID) | ORAL | 0 refills | Status: DC

## 2023-10-17 NOTE — Patient Instructions (Addendum)
 Please call [facility] Phone: [phone]  Please call [facility]  Phone: [phone]

## 2023-10-17 NOTE — Progress Notes (Signed)
 Subjective:    Patient ID: [name], male    DOB: October 19, 1940, 83 y.o.   MRN: [id number]  HPI 83 year old male who presents to the office today for acute issue.  He reports that over the last 2 weeks he has had a productive cough, feeling dizzy, sinus pain and pressure, feeling acutely ill.  He has not had a fever chills, shortness of breath, or wheezing but does feel as though he cannot get a full breath.  Has not called physical therapy to schedule ointment for vestibular rehab nor has he called and scheduled his appointment with [facility].  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   Colonic mass 2001   Fatigue    x 1 week o as of 02-17-2020   Fibromyalgia 1990's    Frequent PVCs    Generalized body aches    x 1 week as of 02-17-2020   H/O inguinal hernia    right side   Hernia, umbilical    History of kidney stones    HX OF MULTIPLE KIDNEY STONES AND  CURRENTLY HAS BILATERAL RENAL STONES CAUSING ABDOMINAL AND BACK PAIN   Hypertension    high blood pressure readings    NSVT (nonsustained ventricular tachycardia) (HCC)    Pneumonia    Premature atrial contractions    Wears dentures    full dentures    Social History   Socioeconomic History   Marital status: Married    Spouse name: Not on file   Number of children: 1   Years of education: Not on file   Highest education level: Not on file  Occupational History   Not on file  Tobacco Use   Smoking status: Never   Smokeless tobacco: Never  Vaping Use   Vaping status: Never Used  Substance and Sexual Activity   Alcohol use: Not Currently    Alcohol/week: 7.0 standard drinks of alcohol    Types: 7 Standard drinks or equivalent per week   Drug use: Not Currently   Sexual  activity: Not Currently  Other Topics Concern   Not on file  Social History Narrative   Retired from Engineer, manufacturing    Has one son - lives local    Likes to play Tennis   Social Drivers of Corporate investment banker Strain: Low Risk  (05/06/2021)   Overall Financial Resource Strain (CARDIA)    Difficulty of Paying Living Expenses: Not hard at all  Food Insecurity: No Food Insecurity (10/06/2022)   Hunger Vital Sign    Worried About Running Out of Food in the Last Year: Never true    Ran Out of Food in the Last Year: Never true  Transportation Needs: No Transportation Needs (10/06/2022)   PRAPARE - Administrator, Civil Service (Medical): No    Lack of Transportation (Non-Medical): No  Physical Activity: Insufficiently Active (05/06/2021)   Exercise Vital Sign    Days of Exercise per Week: 1 day    Minutes of Exercise per Session: 90 min  Stress: No Stress Concern Present (05/06/2021)   [employer] of Occupational Health - Occupational Stress Questionnaire    Feeling of Stress :  Not at all  Social Connections: Moderately Isolated (05/06/2021)   Social Connection and Isolation Panel    Frequency of Communication with Friends and Family: More than three times a week    Frequency of Social Gatherings with Friends and Family: More than three times a week    Attends Religious Services: Never    Database administrator or Organizations: Yes    Attends Engineer, structural: More than 4 times per year    Marital Status: Never married  Intimate Partner Violence: Not At Risk (09/22/2022)   Humiliation, Afraid, Rape, and Kick questionnaire    Fear of Current or Ex-Partner: No    Emotionally Abused: No    Physically Abused: No    Sexually Abused: No    Past Surgical History:  Procedure Laterality Date   BRAIN TUMOR EXCISION  age 63   CARDIAC CATHETERIZATION N/A 04/09/2015   Procedure: Left Heart Cath and Coronary Angiography;  Surgeon: Lonni JONETTA Cash, MD;   Location: [facility];  Service: Cardiovascular;  Laterality: N/A;   COLON SURGERY  2001   COLON RESECTION FOR GROWTH - NOT CANCER   CYSTOSCOPY WITH RETROGRADE PYELOGRAM, URETEROSCOPY AND STENT PLACEMENT Left 10/05/2012   Procedure: CYSTOSCOPY WITH RETROGRADE PYELOGRAM, URETEROSCOPY AND STENT PLACEMENT;  Surgeon: Ricardo Likens, MD;  Location: [facility];  Service: Urology;  Laterality: Left;   CYSTOSCOPY/RETROGRADE/URETEROSCOPY/STONE EXTRACTION WITH BASKET Right 10/05/2012   Procedure: CYSTOSCOPY/RETROGRADE/URETEROSCOPY/STONE EXTRACTION WITH BASKET;  Surgeon: Ricardo Likens, MD;  Location: [facility];  Service: Urology;  Laterality: Right;   CYSTOSCOPY/RETROGRADE/URETEROSCOPY/STONE EXTRACTION WITH BASKET Bilateral 10/24/2012   Procedure: CYSTOSCOPY/RETROGRADE/URETEROSCOPY/STONE EXTRACTION WITH BASKET;  Surgeon: Ricardo Likens, MD;  [facility]: [facility];  Service: Urology;  Laterality: Bilateral;  with bilateral stent placement   CYSTOSCOPY/URETEROSCOPY/HOLMIUM LASER/STENT PLACEMENT Left 05/02/2023   Procedure: CYSTOSCOPY/URETEROSCOPY/HOLMIUM LASER/STENT PLACEMENT;  Surgeon: Shane Steffan BROCKS, MD;  Location: [facility];  Service: Urology;  Laterality: Left;   HOLMIUM LASER APPLICATION Right 10/05/2012   Procedure: HOLMIUM LASER APPLICATION;  Surgeon: Ricardo Likens, MD;  Location: [facility];  Service: Urology;  Laterality: Right;   HOLMIUM LASER APPLICATION Bilateral 10/24/2012   Procedure: HOLMIUM LASER APPLICATION;  Surgeon: Ricardo Likens, MD;  Location: [facility];  Service: Urology;  Laterality: Bilateral;   IR ANGIOGRAM PULMONARY BILATERAL SELECTIVE  09/21/2022   IR INFUSION THROMBOL ARTERIAL INITIAL (MS)  09/21/2022   IR INFUSION THROMBOL ARTERIAL INITIAL (MS)  09/21/2022   IR IVC FILTER PLMT / S&I PORTER GUID/MOD SED  09/25/2022   IR THROMB F/U EVAL ART/VEN FINAL DAY (MS)  09/22/2022   IR US  GUIDE VASC ACCESS RIGHT  09/21/2022   LITHOTRIPSY     TONSILLECTOMY  age 41's   and adenoids    Family History  Problem  Relation Age of Onset   Colon cancer Father    Heart disease Father        quad bypass    Allergies  Allergen Reactions   Other Other (See Comments)    Mangos - caused a severe rash   Prozac  [Fluoxetine  Hcl]     Made him feel loopy   Sulfa Antibiotics Hives   Vicodin [Hydrocodone -Acetaminophen ] Itching    Tolerates oxycodone    Erythromycin Rash    Many years ago an oral antibiotic caused a rash(wife and patient think it was erythromycin but they are not positive).    Current Outpatient Medications on File Prior to Visit  Medication Sig Dispense Refill   carvedilol  (COREG ) 3.125 MG tablet TAKE 1 TABLET BY  MOUTH TWICE A DAY 180 tablet 1   Misc Natural Products (OSTEO BI-FLEX JOINT SHIELD PO) Take 1 tablet by mouth daily.     Multiple Vitamin (MULTIVITAMIN) tablet Take 1 tablet by mouth daily.     ondansetron  (ZOFRAN -ODT) 4 MG disintegrating tablet 4mg  ODT q4 hours prn nausea/vomit 10 tablet 2   pantoprazole  (PROTONIX ) 40 MG tablet Take 1 tablet (40 mg total) by mouth daily. 90 tablet 1   rivaroxaban  (XARELTO ) 20 MG TABS tablet Take 1 tablet (20 mg total) by mouth daily with supper. 30 tablet 11   tamsulosin  (FLOMAX ) 0.4 MG CAPS capsule TAKE 1 CAPSULE BY MOUTH EVERY DAY 90 capsule 3   traMADol  (ULTRAM ) 50 MG tablet TAKE 1 TABLET BY MOUTH EVERY 8 HOURS 90 tablet 0   No current facility-administered medications on file prior to visit.    BP (!) 120/100   Pulse 65   Temp 97.9 F (36.6 C) (Oral)   Ht 6' (1.829 m)   Wt 178 lb (80.7 kg)   SpO2 95%   BMI 24.14 kg/m       Objective:   Physical Exam Vitals and nursing note reviewed.  Constitutional:      Appearance: Normal appearance.  HENT:     Nose: Congestion and rhinorrhea present. Rhinorrhea is purulent.     Right Turbinates: Enlarged.     Left Turbinates: Enlarged.     Right Sinus: Maxillary sinus tenderness and frontal sinus tenderness present.     Left Sinus: Maxillary sinus tenderness and frontal sinus tenderness  present.     Mouth/Throat:     Lips: Pink.     Pharynx: Oropharynx is clear.     Tonsils: Tonsillar abscess present. No tonsillar exudate.  Cardiovascular:     Rate and Rhythm: Normal rate and regular rhythm.     Pulses: Normal pulses.     Heart sounds: Normal heart sounds.  Pulmonary:     Effort: Pulmonary effort is normal.     Breath sounds: Normal breath sounds.  Musculoskeletal:        General: Normal range of motion.  Skin:    General: Skin is warm and dry.  Neurological:     General: No focal deficit present.     Mental Status: He is alert and oriented to person, place, and time.  Psychiatric:        Mood and Affect: Mood normal.        Behavior: Behavior normal.        Thought Content: Thought content normal.        Judgment: Judgment normal.        Assessment & Plan:  1. Acute non-recurrent pansinusitis (Primary)  - amoxicillin -clavulanate (AUGMENTIN ) 875-125 MG tablet; Take 1 tablet by mouth 2 (two) times daily.  Dispense: 20 tablet; Refill: 0 - predniSONE  (DELTASONE ) 10 MG tablet; Take 1 tablet (10 mg total) by mouth daily with breakfast.  Dispense: 5 tablet; Refill: 0  2. Acute cough  - predniSONE  (DELTASONE ) 10 MG tablet; Take 1 tablet (10 mg total) by mouth daily with breakfast.  Dispense: 5 tablet; Refill: 0  -Phone numbers given for physical therapy and [facility] so that he can call and schedule.  Jaise Moser, NP

## 2023-11-09 ENCOUNTER — Ambulatory Visit (INDEPENDENT_AMBULATORY_CARE_PROVIDER_SITE_OTHER): Admitting: Adult Health

## 2023-11-09 ENCOUNTER — Encounter: Payer: Self-pay | Admitting: Adult Health

## 2023-11-09 VITALS — BP 100/80 | HR 72 | Temp 97.7°F | Ht 72.0 in | Wt 173.0 lb

## 2023-11-09 DIAGNOSIS — M150 Primary generalized (osteo)arthritis: Secondary | ICD-10-CM

## 2023-11-09 DIAGNOSIS — J0140 Acute pansinusitis, unspecified: Secondary | ICD-10-CM | POA: Diagnosis not present

## 2023-11-09 DIAGNOSIS — H8113 Benign paroxysmal vertigo, bilateral: Secondary | ICD-10-CM

## 2023-11-09 MED ORDER — DOXYCYCLINE HYCLATE 100 MG PO CAPS: 100.0000 mg | ORAL_CAPSULE | Freq: Two times a day (BID) | ORAL | 0 refills | Status: DC

## 2023-11-09 MED ORDER — TRAMADOL HCL 50 MG PO TABS: 50.0000 mg | ORAL_TABLET | Freq: Three times a day (TID) | ORAL | 0 refills | Status: DC

## 2023-11-09 NOTE — Progress Notes (Signed)
 Subjective:    Patient ID: [name], male    DOB: Jul 20, 1940, 83 y.o.   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  He presents to the office today.  He was seen about 3-1/2 weeks ago for an acute visit related to sinus.  At this time he was experiencing a productive cough, dizzy feeling, sinus pain and pressure and rhinorrhea.  He was prescribed a course of Augmentin  for suspected sinus.  He reports today that this did not help much and he continues to have symptoms.  In the past he has had antibiotic failure with penicillin.  He is also experiencing ongoing  vertigo symptoms and is taking Meclizine  and Zofran  for his symptoms. He has been referred to PT but has not called to schedule yet.  He has had a CT scan of his head back in August 2024 which showed no acute, focal lesion.  Has chronic osteoarthritic pain, mostly in his knees and low back.  He has been referred to [facility] - finally called and was told that the initial appoitment would last about 2 hours, he did not feel up to going to this long appointment so he never scheduled. 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   Colonic mass 2001   Fatigue    x 1 week o as of 02-17-2020   Fibromyalgia 1990's    Frequent PVCs     Generalized body aches    x 1 week as of 02-17-2020   H/O inguinal hernia    right side   Hernia, umbilical    History of kidney stones    HX OF MULTIPLE KIDNEY STONES AND  CURRENTLY HAS BILATERAL RENAL STONES CAUSING ABDOMINAL AND BACK PAIN   Hypertension    high blood pressure readings    NSVT (nonsustained ventricular tachycardia) (HCC)    Pneumonia    Premature atrial contractions    Wears dentures    full dentures    Social History   Socioeconomic History   Marital status: Married    Spouse name: Not on file   Number of children: 1   Years of education: Not on file   Highest education level: Not on file  Occupational History   Not on file  Tobacco Use   Smoking status: Never   Smokeless tobacco: Never  Vaping Use   Vaping status: Never Used  Substance and Sexual Activity   Alcohol use: Not Currently    Alcohol/week: 7.0 standard drinks of alcohol    Types: 7 Standard drinks or equivalent per week   Drug use: Not Currently   Sexual activity: Not Currently  Other Topics Concern   Not on file  Social History Narrative   Retired from Engineer, manufacturing  Has one son - lives local    Likes to play Tennis   Social Drivers of Health   Financial Resource Strain: Low Risk  (05/06/2021)   Overall Financial Resource Strain (CARDIA)    Difficulty of Paying Living Expenses: Not hard at all  Food Insecurity: No Food Insecurity (10/06/2022)   Hunger Vital Sign    Worried About Running Out of Food in the Last Year: Never true    Ran Out of Food in the Last Year: Never true  Transportation Needs: No Transportation Needs (10/06/2022)   PRAPARE - Administrator, Civil Service (Medical): No    Lack of Transportation (Non-Medical): No  Physical Activity: Insufficiently Active (05/06/2021)   Exercise Vital Sign    Days of Exercise per Week: 1 day    Minutes of Exercise per Session: 90 min  Stress: No Stress Concern Present (05/06/2021)   [employer] of  Occupational Health - Occupational Stress Questionnaire    Feeling of Stress : Not at all  Social Connections: Moderately Isolated (05/06/2021)   Social Connection and Isolation Panel    Frequency of Communication with Friends and Family: More than three times a week    Frequency of Social Gatherings with Friends and Family: More than three times a week    Attends Religious Services: Never    Database administrator or Organizations: Yes    Attends Engineer, structural: More than 4 times per year    Marital Status: Never married  Intimate Partner Violence: Not At Risk (09/22/2022)   Humiliation, Afraid, Rape, and Kick questionnaire    Fear of Current or Ex-Partner: No    Emotionally Abused: No    Physically Abused: No    Sexually Abused: No    Past Surgical History:  Procedure Laterality Date   BRAIN TUMOR EXCISION  age 44   CARDIAC CATHETERIZATION N/A 04/09/2015   Procedure: Left Heart Cath and Coronary Angiography;  Surgeon: Lonni JONETTA Cash, MD;  Location: [facility];  Service: Cardiovascular;  Laterality: N/A;   COLON SURGERY  2001   COLON RESECTION FOR GROWTH - NOT CANCER   CYSTOSCOPY WITH RETROGRADE PYELOGRAM, URETEROSCOPY AND STENT PLACEMENT Left 10/05/2012   Procedure: CYSTOSCOPY WITH RETROGRADE PYELOGRAM, URETEROSCOPY AND STENT PLACEMENT;  Surgeon: Ricardo Likens, MD;  Location: [facility];  Service: Urology;  Laterality: Left;   CYSTOSCOPY/RETROGRADE/URETEROSCOPY/STONE EXTRACTION WITH BASKET Right 10/05/2012   Procedure: CYSTOSCOPY/RETROGRADE/URETEROSCOPY/STONE EXTRACTION WITH BASKET;  Surgeon: Ricardo Likens, MD;  Location: [facility];  Service: Urology;  Laterality: Right;   CYSTOSCOPY/RETROGRADE/URETEROSCOPY/STONE EXTRACTION WITH BASKET Bilateral 10/24/2012   Procedure: CYSTOSCOPY/RETROGRADE/URETEROSCOPY/STONE EXTRACTION WITH BASKET;  Surgeon: Ricardo Likens, MD;  Location: [facility];  Service: Urology;  Laterality: Bilateral;  with bilateral stent placement    CYSTOSCOPY/URETEROSCOPY/HOLMIUM LASER/STENT PLACEMENT Left 05/02/2023   Procedure: CYSTOSCOPY/URETEROSCOPY/HOLMIUM LASER/STENT PLACEMENT;  Surgeon: Shane Steffan BROCKS, MD;  Location: [facility];  Service: Urology;  Laterality: Left;   HOLMIUM LASER APPLICATION Right 10/05/2012   Procedure: HOLMIUM LASER APPLICATION;  Surgeon: Ricardo Likens, MD;  Location: [facility];  Service: Urology;  Laterality: Right;   HOLMIUM LASER APPLICATION Bilateral 10/24/2012   Procedure: HOLMIUM LASER APPLICATION;  Surgeon: Ricardo Likens, MD;  Location: [facility];  Service: Urology;  Laterality: Bilateral;   IR ANGIOGRAM PULMONARY BILATERAL SELECTIVE  09/21/2022   IR INFUSION THROMBOL ARTERIAL INITIAL (MS)  09/21/2022   IR INFUSION THROMBOL ARTERIAL INITIAL (MS)  09/21/2022   IR IVC FILTER PLMT / S&I PORTER GUID/MOD SED  09/25/2022  IR THROMB F/U EVAL ART/VEN FINAL DAY (MS)  09/22/2022   IR US  GUIDE VASC ACCESS RIGHT  09/21/2022   LITHOTRIPSY     TONSILLECTOMY  age 48's   and adenoids    Family History  Problem Relation Age of Onset   Colon cancer Father    Heart disease Father        quad bypass    Allergies  Allergen Reactions   Other Other (See Comments)    Mangos - caused a severe rash   Prozac  [Fluoxetine  Hcl]     Made him feel loopy   Sulfa Antibiotics Hives   Vicodin [Hydrocodone -Acetaminophen ] Itching    Tolerates oxycodone    Erythromycin Rash    Many years ago an oral antibiotic caused a rash(wife and patient think it was erythromycin but they are not positive).    Current Outpatient Medications on File Prior to Visit  Medication Sig Dispense Refill   carvedilol  (COREG ) 3.125 MG tablet TAKE 1 TABLET BY MOUTH TWICE A DAY 180 tablet 1   Misc Natural Products (OSTEO BI-FLEX JOINT SHIELD PO) Take 1 tablet by mouth daily.     Multiple Vitamin (MULTIVITAMIN) tablet Take 1 tablet by mouth daily.     ondansetron  (ZOFRAN -ODT) 4 MG disintegrating tablet 4mg  ODT q4 hours prn nausea/vomit 10 tablet 2    pantoprazole  (PROTONIX ) 40 MG tablet Take 1 tablet (40 mg total) by mouth daily. 90 tablet 1   rivaroxaban  (XARELTO ) 20 MG TABS tablet Take 1 tablet (20 mg total) by mouth daily with supper. 30 tablet 11   tamsulosin  (FLOMAX ) 0.4 MG CAPS capsule TAKE 1 CAPSULE BY MOUTH EVERY DAY 90 capsule 3   No current facility-administered medications on file prior to visit.    BP 100/80   Pulse 72   Temp 97.7 F (36.5 C) (Oral)   Ht 6' (1.829 m)   Wt 173 lb (78.5 kg)   SpO2 95%   BMI 23.46 kg/m       Objective:   Physical Exam Vitals and nursing note reviewed.  Constitutional:      Appearance: Normal appearance.  HENT:     Right Ear: A middle ear effusion is present. Tympanic membrane is not erythematous or bulging.     Left Ear: A middle ear effusion is present. Tympanic membrane is not erythematous or bulging.     Nose: Congestion present. No rhinorrhea.     Right Turbinates: Enlarged and swollen.     Left Turbinates: Enlarged and swollen.     Right Sinus: Maxillary sinus tenderness and frontal sinus tenderness present.     Left Sinus: Maxillary sinus tenderness and frontal sinus tenderness present.     Mouth/Throat:     Pharynx: Oropharynx is clear. Uvula midline. No pharyngeal swelling, posterior oropharyngeal erythema or uvula swelling.  Cardiovascular:     Rate and Rhythm: Normal rate and regular rhythm.     Pulses: Normal pulses.     Heart sounds: Normal heart sounds.  Pulmonary:     Effort: Pulmonary effort is normal.     Breath sounds: Normal breath sounds. No stridor. No wheezing or rhonchi.  Musculoskeletal:        General: Normal range of motion.  Skin:    General: Skin is warm and dry.  Neurological:     General: No focal deficit present.     Mental Status: He is alert and oriented to person, place, and time.  Psychiatric:        Mood and  Affect: Mood normal.        Behavior: Behavior normal.        Thought Content: Thought content normal.        Judgment: Judgment  normal.       Assessment & Plan:  1. Acute non-recurrent pansinusitis (Primary) - Likely antibiotic failure with Augmentin . Will send in Doxycycline . Follow up if not resolved by the end of abx treatment  - doxycycline  (VIBRAMYCIN ) 100 MG capsule; Take 1 capsule (100 mg total) by mouth 2 (two) times daily.  Dispense: 20 capsule; Refill: 0  2. Primary generalized (osteo)arthritis - Advised that he needs to go and see pain management. I will no longer be refilling his pain medication  - traMADol  (ULTRAM ) 50 MG tablet; Take 1 tablet (50 mg total) by mouth every 8 (eight) hours.  Dispense: 90 tablet; Refill: 0  3. Benign paroxysmal positional vertigo due to bilateral vestibular disorder -  Encouraged to call PT and schedule his appointments for vestibular rehab   Darleene Shape, NP   I personally spent a total of 31 minutes in the care of the patient today including preparing to see the patient, getting/reviewing separately obtained history, performing a medically appropriate exam/evaluation, counseling and educating, placing orders, and documenting clinical information in the EHR.

## 2023-12-06 ENCOUNTER — Other Ambulatory Visit: Payer: Self-pay | Admitting: Adult Health

## 2023-12-06 DIAGNOSIS — R112 Nausea with vomiting, unspecified: Secondary | ICD-10-CM

## 2023-12-27 ENCOUNTER — Other Ambulatory Visit: Payer: Self-pay

## 2023-12-27 ENCOUNTER — Emergency Department (HOSPITAL_COMMUNITY)
Admission: EM | Admit: 2023-12-27 | Discharge: 2023-12-28 | Disposition: A | Discharge status: Home / Self Care | Attending: Emergency Medicine | Admitting: Emergency Medicine

## 2023-12-27 ENCOUNTER — Encounter (HOSPITAL_COMMUNITY): Payer: Self-pay

## 2023-12-27 DIAGNOSIS — M791 Myalgia, unspecified site: Secondary | ICD-10-CM | POA: Insufficient documentation

## 2023-12-27 DIAGNOSIS — B029 Zoster without complications: Secondary | ICD-10-CM | POA: Insufficient documentation

## 2023-12-27 DIAGNOSIS — I10 Essential (primary) hypertension: Secondary | ICD-10-CM | POA: Diagnosis not present

## 2023-12-27 DIAGNOSIS — Z85828 Personal history of other malignant neoplasm of skin: Secondary | ICD-10-CM | POA: Insufficient documentation

## 2023-12-27 DIAGNOSIS — Z7901 Long term (current) use of anticoagulants: Secondary | ICD-10-CM | POA: Insufficient documentation

## 2023-12-27 DIAGNOSIS — Z86711 Personal history of pulmonary embolism: Secondary | ICD-10-CM | POA: Diagnosis not present

## 2023-12-27 DIAGNOSIS — Z85841 Personal history of malignant neoplasm of brain: Secondary | ICD-10-CM | POA: Insufficient documentation

## 2023-12-27 DIAGNOSIS — R21 Rash and other nonspecific skin eruption: Secondary | ICD-10-CM | POA: Diagnosis present

## 2023-12-27 DIAGNOSIS — Z79899 Other long term (current) drug therapy: Secondary | ICD-10-CM | POA: Insufficient documentation

## 2023-12-27 DIAGNOSIS — R110 Nausea: Secondary | ICD-10-CM | POA: Insufficient documentation

## 2023-12-27 LAB — CBC WITH DIFFERENTIAL/PLATELET
Abs Immature Granulocytes: 0.01 K/uL (ref 0.00–0.07)
Basophils Absolute: 0.1 K/uL (ref 0.0–0.1)
Basophils Relative: 1 %
Eosinophils Absolute: 0.1 K/uL (ref 0.0–0.5)
Eosinophils Relative: 2 %
HCT: 45.5 % (ref 39.0–52.0)
Hemoglobin: 15.4 g/dL (ref 13.0–17.0)
Immature Granulocytes: 0 %
Lymphocytes Relative: 29 %
Lymphs Abs: 1.9 K/uL (ref 0.7–4.0)
MCH: 33.9 pg (ref 26.0–34.0)
MCHC: 33.8 g/dL (ref 30.0–36.0)
MCV: 100.2 fL — ABNORMAL HIGH (ref 80.0–100.0)
Monocytes Absolute: 1 K/uL (ref 0.1–1.0)
Monocytes Relative: 15 %
Neutro Abs: 3.6 K/uL (ref 1.7–7.7)
Neutrophils Relative %: 53 %
Platelets: 301 K/uL (ref 150–400)
RBC: 4.54 MIL/uL (ref 4.22–5.81)
RDW: 13.4 % (ref 11.5–15.5)
WBC: 6.6 K/uL (ref 4.0–10.5)
nRBC: 0 % (ref 0.0–0.2)

## 2023-12-27 LAB — BASIC METABOLIC PANEL WITH GFR
Anion gap: 10 (ref 5–15)
BUN: 16 mg/dL (ref 8–23)
CO2: 26 mmol/L (ref 22–32)
Calcium: 9.3 mg/dL (ref 8.9–10.3)
Chloride: 103 mmol/L (ref 98–111)
Creatinine, Ser: 1.23 mg/dL (ref 0.61–1.24)
GFR, Estimated: 59 mL/min — ABNORMAL LOW (ref >60)
Glucose, Bld: 106 mg/dL — ABNORMAL HIGH (ref 70–99)
Potassium: 4.4 mmol/L (ref 3.5–5.1)
Sodium: 139 mmol/L (ref 135–145)

## 2023-12-27 NOTE — ED Provider Triage Note (Signed)
 Emergency Medicine Provider Triage Evaluation Note  YECHESKEL KUREK , a 83 y.o. male  was evaluated in triage.  Pt complains of painful sores to right buttocks that started a couple of hours ago. No fevers  Review of Systems  Positive: See hpi Negative:   Physical Exam  BP (!) 188/117 (BP Location: Left Arm)   Pulse 74   Temp 97.9 F (36.6 C)   Resp 17   Ht 6' (1.829 m)   Wt 80.7 kg   SpO2 100%   BMI 24.14 kg/m  Gen:   Awake, no distress   Resp:  Normal effort  MSK:   Moves extremities without difficulty  Other:    Medical Decision Making  Medically screening exam initiated at 10:43 PM.  Appropriate orders placed.  PEREGRINE NOLT was informed that the remainder of the evaluation will be completed by another provider, this initial triage assessment does not replace that evaluation, and the importance of remaining in the ED until their evaluation is complete.  Will need GU exam. Labs ordered   Minnie Tinnie FORBES, PA 12/27/23 2244

## 2023-12-27 NOTE — ED Triage Notes (Addendum)
 Pt reports he noticed multiple open, burning wounds on the right buttocks tonight that are burst open. They appear like red blotches with some scattered blisters. He thinks they may be spider bites. He feels nausea, dizziness and headache as well and pain everywhere, I'm just hurting everywhere.

## 2023-12-28 MED ORDER — VALACYCLOVIR HCL 1 G PO TABS
1000.0000 mg | ORAL_TABLET | Freq: Three times a day (TID) | ORAL | 0 refills | Status: AC
Start: 2023-12-28 — End: ?

## 2023-12-28 MED ORDER — OXYCODONE-ACETAMINOPHEN 5-325 MG PO TABS
1.0000 | ORAL_TABLET | Freq: Once | ORAL | Status: AC
  Administered 2023-12-28: 1 via ORAL
  Filled 2023-12-28: qty 1

## 2023-12-28 MED ORDER — OXYCODONE-ACETAMINOPHEN 5-325 MG PO TABS: 1.0000 | ORAL_TABLET | Freq: Four times a day (QID) | ORAL | 0 refills | Status: AC | PRN

## 2023-12-28 NOTE — ED Provider Notes (Signed)
 [facility] EMERGENCY DEPARTMENT AT [facility] Provider Note   CSN: [id number] Arrival date & time: 12/27/23  2226     Patient presents with: Wound Check   [name] is a 83 y.o. male.   The history is provided by the patient.  Patient presents for wounds on his buttocks Patient reports he noticed he was bleeding from his buttocks.  He reports he felt like he has wounds there.  No trauma.  He is overall active and [place].  No fevers or chills.  He reports the pain is burning He thought it might be a spider bite.  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   Colonic mass 2001   Fatigue    x 1 week o as of 02-17-2020   Fibromyalgia 1990's    Frequent PVCs    Generalized body aches    x 1 week as of 02-17-2020   H/O inguinal hernia    right side   Hernia, umbilical    History of kidney stones    HX OF MULTIPLE KIDNEY STONES AND  CURRENTLY HAS BILATERAL RENAL STONES CAUSING ABDOMINAL AND BACK PAIN   Hypertension    high blood pressure readings    NSVT (nonsustained ventricular tachycardia) (HCC)    Pneumonia    Premature atrial contractions    Wears dentures    full dentures    Prior to Admission medications   Medication Sig Start Date End Date Taking? Authorizing Provider  oxyCODONE -acetaminophen  (PERCOCET/ROXICET) 5-325 MG tablet Take 1 tablet by mouth every 6 (six) hours as needed for severe pain (pain score 7-10). 12/28/23  Yes Midge Golas, MD  valACYclovir  (VALTREX ) 1000 MG tablet Take 1 tablet (1,000 mg total) by mouth 3 (three) times daily.  12/28/23  Yes Midge Golas, MD  carvedilol  (COREG ) 3.125 MG tablet TAKE 1 TABLET BY MOUTH TWICE A DAY 09/29/23   Nafziger, Darleene, NP  Misc Natural Products (OSTEO BI-FLEX JOINT SHIELD PO) Take 1 tablet by mouth daily.    [provider]  Multiple Vitamin (MULTIVITAMIN) tablet Take 1 tablet by mouth daily.    [provider]  pantoprazole  (PROTONIX ) 40 MG tablet Take 1 tablet (40 mg total) by mouth daily. 08/22/23   Nafziger, Darleene, NP  rivaroxaban  (XARELTO ) 20 MG TABS tablet Take 1 tablet (20 mg total) by mouth daily with supper. 11/25/22   Nafziger, Darleene, NP  tamsulosin  (FLOMAX ) 0.4 MG CAPS capsule TAKE 1 CAPSULE BY MOUTH EVERY DAY 02/15/23   Merna Darleene, NP    Allergies: Other, Prozac  [fluoxetine  hcl], Sulfa antibiotics, Vicodin [hydrocodone -acetaminophen ], and Erythromycin    Review of Systems  Constitutional:  Negative for fever.  Skin:  Positive for wound.    Updated Vital Signs BP (!) 165/90 (BP Location: Right Arm)   Pulse 80   Temp 98 F (36.7 C) (Oral)   Resp 18   Ht 1.829 m (6')   Wt 80.7 kg   SpO2 100%   BMI 24.14 kg/m   Physical  Exam CONSTITUTIONAL: Well developed/well nourished HEAD: Normocephalic/atraumatic NEURO: Pt is awake/alert/appropriate, moves all extremitiesx4.  No facial droop.   SKIN: warm, color normal Vertical rash noted on the right buttock with blisters is consistent with herpes zoster.  No active bleeding. No evidence of cellulitis or abscess. No evidence of perirectal abscess. No scrotal tenderness or erythema Wife present on exam patient request PSYCH: no abnormalities of mood noted, alert and oriented to situation  (all labs ordered are listed, but only abnormal results are displayed) Labs Reviewed  CBC WITH DIFFERENTIAL/PLATELET - Abnormal; Notable for the following components:      Result Value   MCV 100.2 (*)    All other components within normal limits  BASIC METABOLIC PANEL WITH GFR - Abnormal; Notable for the  following components:   Glucose, Bld 106 (*)    GFR, Estimated 59 (*)    All other components within normal limits    EKG: None  Radiology: No results found.   Procedures   Medications Ordered in the ED  oxyCODONE -acetaminophen  (PERCOCET/ROXICET) 5-325 MG per tablet 1 tablet (1 tablet Oral Given 12/28/23 0158)                                    Medical Decision Making Risk Prescription drug management.   Patient presents with a rash to his buttock, this appears consistent with herpes zoster. Plan will be to start patient on Valtrex, short course of pain medicine as he reports he can tolerate Percocet No other signs of infectious etiology.  Patient safe for discharge     Final diagnoses:  Herpes zoster without complication    ED Discharge Orders          Ordered    valACYclovir (VALTREX) 1000 MG tablet  3 times daily        12/28/23 0202    oxyCODONE -acetaminophen  (PERCOCET/ROXICET) 5-325 MG tablet  Every 6 hours PRN        12/28/23 0202               Midge Golas, MD 12/28/23 [phone]

## 2023-12-31 ENCOUNTER — Other Ambulatory Visit: Payer: Self-pay | Admitting: Adult Health

## 2024-02-14 ENCOUNTER — Telehealth: Payer: Self-pay

## 2024-02-14 NOTE — Progress Notes (Signed)
" ° °  02/14/2024  Patient ID: [name], male   DOB: Apr 20, 1940, 84 y.o.   MRN: [id number]  Received notification from [employer] [facility] that patient has been APPROVED to continue to receive Xarelto  at no cost through 02/06/25.  Jon VEAR Lindau, PharmD Clinical Pharmacist [phone]  "

## 2024-02-28 ENCOUNTER — Encounter: Payer: Self-pay | Admitting: Adult Health

## 2024-02-28 ENCOUNTER — Ambulatory Visit: Admitting: Adult Health

## 2024-02-28 VITALS — BP 112/80 | HR 106 | Temp 97.8°F | Ht 72.0 in | Wt 178.0 lb

## 2024-02-28 DIAGNOSIS — R200 Anesthesia of skin: Secondary | ICD-10-CM

## 2024-02-28 DIAGNOSIS — J0140 Acute pansinusitis, unspecified: Secondary | ICD-10-CM

## 2024-02-28 MED ORDER — AMOXICILLIN-POT CLAVULANATE 875-125 MG PO TABS: 1.0000 | ORAL_TABLET | Freq: Two times a day (BID) | ORAL | 0 refills | Status: AC

## 2024-02-28 NOTE — Progress Notes (Signed)
 "  Acute Office Visit  Subjective:     Patient ID: [name], male    DOB: Jan 22, 1941, 84 y.o.   MRN: [id number]  Chief Complaint  Patient presents with   Nasal Congestion   Rash    HPI  Patient is in today for headache and sinus pressure for 1-2 months. States he had a similar episode in September 2025 for which he was prescribed Augmentin  that resolved his symptoms. He denies fever, chills, shortness of breath, sore throat, ear pain, rhinorrhea, or nasal congestion.   Patient also states that he has developed bilateral anterior lower extremity discomfort, numbness, and redness that extends from below the knee to his toes . He reports that these symptoms are worse in the morning. He denies swelling or missed doses of Xarelto . He states that his pain does not worsen with ambulation.   ROS See HPI    Objective:    BP 112/80   Pulse (!) 106   Temp 97.8 F (36.6 C) (Oral)   Ht 6' (1.829 m)   Wt 80.7 kg   SpO2 96%   BMI 24.14 kg/m   Physical Exam Constitutional:      General: He is not in acute distress.    Appearance: Normal appearance. He is not ill-appearing.  HENT:     Head: Normocephalic and atraumatic.     Nose:     Right Sinus: Maxillary sinus tenderness and frontal sinus tenderness present.     Left Sinus: Maxillary sinus tenderness and frontal sinus tenderness present.     Mouth/Throat:     Mouth: Mucous membranes are moist.     Pharynx: Oropharynx is clear. No posterior oropharyngeal erythema.  Eyes:     Pupils: Pupils are equal, round, and reactive to light.  Cardiovascular:     Rate and Rhythm: Regular rhythm. Tachycardia present.     Pulses:          Dorsalis pedis pulses are 1+ on the right side and 1+ on the left side.       Posterior tibial pulses are 1+ on the right side and 1+ on the left side.     Heart sounds: Normal heart sounds.  Pulmonary:     Effort: Pulmonary effort is normal.     Breath sounds: Normal breath sounds.  Abdominal:      General: Bowel sounds are normal.     Palpations: Abdomen is soft.     Hernia: A hernia is present.  Musculoskeletal:     Cervical back: Normal range of motion.     Comments: Mild erythema noted to anterior surface of bilateral lower extremities extending from below the knees to the toes. Upon standing, noted blue discoloration to bilateral feet   Skin:    General: Skin is warm and dry.     Capillary Refill: Capillary refill takes less than 2 seconds.  Neurological:     Mental Status: He is alert and oriented to person, place, and time.  Psychiatric:        Mood and Affect: Mood normal.        Behavior: Behavior normal.       Assessment & Plan:   1. Acute non-recurrent pansinusitis (Primary) - amoxicillin -clavulanate (AUGMENTIN ) 875-125 MG tablet; Take 1 tablet by mouth 2 (two) times daily.  Dispense: 20 tablet; Refill: 0   2. Lower extremity numbness  - Differentials include PAD, lumbar radiculopathy, claudication (less likely). Not concerned for DVT or cellulitis.  - Orders entered  for ABI to r/o PAD  - Consider referral to Vascular Surgery   Debby CHRISTELLA Borer, RN FNP Student  "

## 2024-02-28 NOTE — Patient Instructions (Addendum)
 [name]

## 2024-03-09 ENCOUNTER — Other Ambulatory Visit: Payer: Self-pay

## 2024-03-09 ENCOUNTER — Emergency Department (HOSPITAL_BASED_OUTPATIENT_CLINIC_OR_DEPARTMENT_OTHER)
Admission: EM | Admit: 2024-03-09 | Discharge: 2024-03-09 | Disposition: A | Discharge status: Home / Self Care | Attending: Emergency Medicine | Admitting: Emergency Medicine

## 2024-03-09 DIAGNOSIS — Z85841 Personal history of malignant neoplasm of brain: Secondary | ICD-10-CM | POA: Diagnosis not present

## 2024-03-09 DIAGNOSIS — X58XXXA Exposure to other specified factors, initial encounter: Secondary | ICD-10-CM | POA: Insufficient documentation

## 2024-03-09 DIAGNOSIS — Z85828 Personal history of other malignant neoplasm of skin: Secondary | ICD-10-CM | POA: Diagnosis not present

## 2024-03-09 DIAGNOSIS — Z7901 Long term (current) use of anticoagulants: Secondary | ICD-10-CM | POA: Diagnosis not present

## 2024-03-09 DIAGNOSIS — S00411A Abrasion of right ear, initial encounter: Secondary | ICD-10-CM | POA: Insufficient documentation

## 2024-03-09 DIAGNOSIS — Z79899 Other long term (current) drug therapy: Secondary | ICD-10-CM | POA: Insufficient documentation

## 2024-03-09 DIAGNOSIS — I10 Essential (primary) hypertension: Secondary | ICD-10-CM | POA: Diagnosis not present

## 2024-03-09 DIAGNOSIS — S0991XA Unspecified injury of ear, initial encounter: Secondary | ICD-10-CM | POA: Diagnosis present

## 2024-03-09 DIAGNOSIS — Y9384 Activity, sleeping: Secondary | ICD-10-CM | POA: Diagnosis not present

## 2024-03-09 NOTE — Discharge Instructions (Signed)
 You were seen for an abrasion to the right ear.  Keep dressing in place for the next 12 to 24 hours to prevent rebleeding.

## 2024-03-09 NOTE — ED Triage Notes (Signed)
 Pt bib [facility] from home with complaints of scratching his right ear around 1600 today. Pt called EMS once and this afternoon and the bleeding had stopped but picked back up again an hour ago. Bleeding has been stopped for about 45 min prior to arrival. Pt takes xarelto   for blood clots. AOx4. Resp EU

## 2024-03-09 NOTE — ED Provider Notes (Signed)
 "  EMERGENCY DEPARTMENT AT [facility] Provider Note   CSN: [id number] Arrival date & time: 03/09/24  0021     Patient presents with: No chief complaint on file.   [name] is a 84 y.o. male.   HPI     This is an 84 year old male on Eliquis  who presents with bleeding in the right ear.  He believes he scratched his ear while he was sleeping.  He noted significant bleeding to the ear and was unable to get it to stop.  Initially he called EMS and they helped to get it to stop; however it rebled.  Denies other symptoms.  Prior to Admission medications  Medication Sig Start Date End Date Taking? Authorizing Provider  amoxicillin -clavulanate (AUGMENTIN ) 875-125 MG tablet Take 1 tablet by mouth 2 (two) times daily. 02/28/24   Nafziger, Darleene, NP  carvedilol  (COREG ) 3.125 MG tablet TAKE 1 TABLET BY MOUTH TWICE A DAY 09/29/23   Nafziger, Darleene, NP  Misc Natural Products (OSTEO BI-FLEX JOINT SHIELD PO) Take 1 tablet by mouth daily.    [provider]  Multiple Vitamin (MULTIVITAMIN) tablet Take 1 tablet by mouth daily.    [provider]  oxyCODONE -acetaminophen  (PERCOCET/ROXICET) 5-325 MG tablet Take 1 tablet by mouth every 6 (six) hours as needed for severe pain (pain score 7-10). 12/28/23   Midge Golas, MD  pantoprazole  (PROTONIX ) 40 MG tablet Take 1 tablet (40 mg total) by mouth daily. 08/22/23   Nafziger, Darleene, NP  rivaroxaban  (XARELTO ) 20 MG TABS tablet TAKE 1 TABLET BY MOUTH EVERY DAY WITH SUPPER 01/02/24   Nafziger, Darleene, NP  tamsulosin  (FLOMAX ) 0.4 MG CAPS capsule TAKE 1 CAPSULE BY MOUTH EVERY DAY 02/15/23   Nafziger, Darleene, NP  valACYclovir  (VALTREX ) 1000 MG tablet Take 1 tablet (1,000 mg total) by mouth 3 (three) times daily. 12/28/23   Midge Golas, MD    Allergies: Other, Prozac  [fluoxetine  hcl], Sulfa antibiotics, Vicodin [hydrocodone -acetaminophen ], and Erythromycin    Review of Systems  Constitutional:  Negative for fever.   Respiratory:  Negative for shortness of breath.   Cardiovascular:  Negative for chest pain.  Skin:        Ear abrasion  All other systems reviewed and are negative.   Updated Vital Signs BP (!) 150/99 (BP Location: Left Arm)   Pulse 76   Temp 97.9 F (36.6 C)   Resp 16   Ht 1.829 m (6')   Wt 79.4 kg   SpO2 99%   BMI 23.73 kg/m   Physical Exam Vitals and nursing note reviewed.  Constitutional:      Appearance: He is well-developed. He is not ill-appearing.  HENT:     Head: Normocephalic and atraumatic.     Ears:     Comments: Small abrasion to the right ear, no active bleeding    Nose: Nose normal.     Mouth/Throat:     Mouth: Mucous membranes are moist.  Eyes:     Pupils: Pupils are equal, round, and reactive to light.  Cardiovascular:     Rate and Rhythm: Normal rate and regular rhythm.     Heart sounds: Normal heart sounds. No murmur heard. Pulmonary:     Effort: Pulmonary effort is normal. No respiratory distress.     Breath sounds: Normal breath sounds. No wheezing.  Abdominal:     Palpations: Abdomen is soft.  Musculoskeletal:     Cervical back: Neck supple.  Skin:    General: Skin is warm and dry.  Neurological:     Mental Status: He is alert and oriented to person, place, and time.  Psychiatric:        Mood and Affect: Mood normal.     (all labs ordered are listed, but only abnormal results are displayed) Labs Reviewed - No data to display  EKG: None  Radiology: No results found.   Procedures   Medications Ordered in the ED - No data to display                                  Medical Decision Making  This patient presents to the ED for concern of bleeding, this involves an extensive number of treatment options, and is a complaint that carries with it a high risk of complications and morbidity.  I considered the following differential and admission for this acute, potentially life threatening condition.  The differential diagnosis  includes abrasion, laceration  MDM:    This is a 84 year old male who presents with bleeding from the right ear.  Reports abrasion/laceration.  He is nontoxic and vital signs are reassuring.  He has a small superficial abrasion to the right ear with no active bleeding.  Will place a bulky dressing to prevent rebleeding.  No workup needed.  (Labs, imaging, consults)  Labs: I Ordered, and personally interpreted labs.  The pertinent results include: N/A  Imaging Studies ordered: I ordered imaging studies including N/A I independently visualized and interpreted imaging. I agree with the radiologist interpretation  Additional history obtained from chart review.  External records from outside source obtained and reviewed including prior evaluations  Cardiac Monitoring: The patient was not maintained on a cardiac monitor.  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   Colonic mass 2001   Fatigue    x 1 week o as of 02-17-2020   Fibromyalgia 1990's    Frequent PVCs    Generalized body aches    x 1 week as of 02-17-2020   H/O inguinal hernia    right side   Hernia, umbilical    History of kidney stones    HX OF MULTIPLE KIDNEY STONES AND  CURRENTLY HAS BILATERAL RENAL STONES CAUSING ABDOMINAL AND BACK PAIN   Hypertension    high blood pressure readings    NSVT (nonsustained ventricular tachycardia) (HCC)    Pneumonia     Premature atrial contractions    Wears dentures    full dentures     Medicines No orders of the defined types were placed in this encounter.   I have reviewed the patients home medicines and have made adjustments as needed  Problem List / ED Course: Problem List Items Addressed This Visit   None Visit Diagnoses       Abrasion of right ear, initial encounter    -  Primary  Final diagnoses:  Abrasion of right ear, initial encounter    ED Discharge Orders     None          Hadi Dubin, Charmaine FALCON, MD 03/09/24 0158  "

## 2024-03-14 ENCOUNTER — Ambulatory Visit (HOSPITAL_COMMUNITY)
Admission: RE | Admit: 2024-03-14 | Discharge: 2024-03-14 | Disposition: A | Source: Ambulatory Visit | Attending: Adult Health

## 2024-03-14 DIAGNOSIS — R200 Anesthesia of skin: Secondary | ICD-10-CM

## 2024-03-14 LAB — VAS US ABI WITH/WO TBI
Left ABI: 1.21
Right ABI: 1.16
